# Patient Record
Sex: Male | Born: 1935 | Race: White | Hispanic: No | Marital: Married | Smoking: Former smoker
Health system: Southern US, Community
[De-identification: ages and names within clinical notes are randomized; demographics above are authoritative.]

## PROBLEM LIST (undated history)

## (undated) DIAGNOSIS — M549 Dorsalgia, unspecified: Secondary | ICD-10-CM

## (undated) DIAGNOSIS — I10 Essential (primary) hypertension: Secondary | ICD-10-CM

## (undated) DIAGNOSIS — T783XXA Angioneurotic edema, initial encounter: Secondary | ICD-10-CM

## (undated) DIAGNOSIS — I712 Thoracic aortic aneurysm, without rupture, unspecified: Secondary | ICD-10-CM

## (undated) DIAGNOSIS — M1990 Unspecified osteoarthritis, unspecified site: Secondary | ICD-10-CM

## (undated) DIAGNOSIS — T4145XA Adverse effect of unspecified anesthetic, initial encounter: Secondary | ICD-10-CM

## (undated) DIAGNOSIS — C801 Malignant (primary) neoplasm, unspecified: Secondary | ICD-10-CM

## (undated) DIAGNOSIS — I493 Ventricular premature depolarization: Secondary | ICD-10-CM

## (undated) DIAGNOSIS — R339 Retention of urine, unspecified: Secondary | ICD-10-CM

## (undated) DIAGNOSIS — Z973 Presence of spectacles and contact lenses: Secondary | ICD-10-CM

## (undated) DIAGNOSIS — T8859XA Other complications of anesthesia, initial encounter: Secondary | ICD-10-CM

## (undated) DIAGNOSIS — I714 Abdominal aortic aneurysm, without rupture, unspecified: Secondary | ICD-10-CM

## (undated) DIAGNOSIS — N189 Chronic kidney disease, unspecified: Secondary | ICD-10-CM

## (undated) DIAGNOSIS — I2510 Atherosclerotic heart disease of native coronary artery without angina pectoris: Secondary | ICD-10-CM

## (undated) HISTORY — PX: CARDIAC CATHETERIZATION: SHX172

## (undated) HISTORY — PX: COLONOSCOPY: SHX174

## (undated) HISTORY — PX: TONSILLECTOMY: SUR1361

## (undated) HISTORY — PX: CORONARY ARTERY BYPASS GRAFT: SHX141

## (undated) HISTORY — PX: SPINE SURGERY: SHX786

## (undated) HISTORY — DX: Angioneurotic edema, initial encounter: T78.3XXA

## (undated) HISTORY — PX: HERNIA REPAIR: SHX51

---

## 1998-02-08 ENCOUNTER — Inpatient Hospital Stay (HOSPITAL_COMMUNITY): Admission: EM | Admit: 1998-02-08 | Discharge: 1998-02-10 | Payer: Self-pay | Admitting: Emergency Medicine

## 1998-02-18 ENCOUNTER — Inpatient Hospital Stay (HOSPITAL_COMMUNITY): Admission: AD | Admit: 1998-02-18 | Discharge: 1998-02-20 | Payer: Self-pay | Admitting: Cardiology

## 1998-05-31 ENCOUNTER — Inpatient Hospital Stay (HOSPITAL_COMMUNITY): Admission: EM | Admit: 1998-05-31 | Discharge: 1998-06-02 | Payer: Self-pay | Admitting: Cardiology

## 1998-06-16 ENCOUNTER — Emergency Department (HOSPITAL_COMMUNITY): Admission: EM | Admit: 1998-06-16 | Discharge: 1998-06-16 | Payer: Self-pay | Admitting: Emergency Medicine

## 1998-06-16 ENCOUNTER — Encounter: Payer: Self-pay | Admitting: *Deleted

## 2000-04-18 ENCOUNTER — Emergency Department (HOSPITAL_COMMUNITY): Admission: EM | Admit: 2000-04-18 | Discharge: 2000-04-18 | Payer: Self-pay

## 2004-01-15 ENCOUNTER — Encounter: Admission: RE | Admit: 2004-01-15 | Discharge: 2004-01-15 | Payer: Self-pay | Admitting: Otolaryngology

## 2005-06-07 ENCOUNTER — Encounter: Admission: RE | Admit: 2005-06-07 | Discharge: 2005-06-07 | Payer: Self-pay | Admitting: Family Medicine

## 2005-12-11 ENCOUNTER — Ambulatory Visit (HOSPITAL_COMMUNITY): Admission: RE | Admit: 2005-12-11 | Discharge: 2005-12-11 | Payer: Self-pay | Admitting: Neurosurgery

## 2006-04-17 ENCOUNTER — Ambulatory Visit (HOSPITAL_COMMUNITY): Admission: RE | Admit: 2006-04-17 | Discharge: 2006-04-17 | Payer: Self-pay | Admitting: Neurosurgery

## 2008-01-17 ENCOUNTER — Ambulatory Visit: Payer: Self-pay | Admitting: Gastroenterology

## 2008-02-03 ENCOUNTER — Ambulatory Visit: Payer: Self-pay | Admitting: Gastroenterology

## 2008-07-24 HISTORY — PX: CORONARY ARTERY BYPASS GRAFT: SHX141

## 2009-05-26 ENCOUNTER — Ambulatory Visit (HOSPITAL_COMMUNITY): Admission: RE | Admit: 2009-05-26 | Discharge: 2009-05-26 | Payer: Self-pay | Admitting: Cardiology

## 2009-05-31 ENCOUNTER — Ambulatory Visit: Payer: Self-pay | Admitting: Surgery

## 2009-06-08 ENCOUNTER — Ambulatory Visit: Payer: Self-pay | Admitting: Thoracic Surgery (Cardiothoracic Vascular Surgery)

## 2009-06-09 ENCOUNTER — Encounter: Payer: Self-pay | Admitting: Thoracic Surgery (Cardiothoracic Vascular Surgery)

## 2009-06-09 ENCOUNTER — Ambulatory Visit (HOSPITAL_COMMUNITY)
Admission: RE | Admit: 2009-06-09 | Discharge: 2009-06-09 | Payer: Self-pay | Admitting: Thoracic Surgery (Cardiothoracic Vascular Surgery)

## 2009-06-11 ENCOUNTER — Ambulatory Visit: Payer: Self-pay | Admitting: Thoracic Surgery (Cardiothoracic Vascular Surgery)

## 2009-06-11 ENCOUNTER — Inpatient Hospital Stay (HOSPITAL_COMMUNITY)
Admission: RE | Admit: 2009-06-11 | Discharge: 2009-06-15 | Payer: Self-pay | Admitting: Thoracic Surgery (Cardiothoracic Vascular Surgery)

## 2009-07-01 ENCOUNTER — Encounter
Admission: RE | Admit: 2009-07-01 | Discharge: 2009-07-01 | Payer: Self-pay | Admitting: Thoracic Surgery (Cardiothoracic Vascular Surgery)

## 2009-07-01 ENCOUNTER — Ambulatory Visit: Payer: Self-pay | Admitting: Thoracic Surgery (Cardiothoracic Vascular Surgery)

## 2009-07-29 ENCOUNTER — Encounter (HOSPITAL_COMMUNITY): Admission: RE | Admit: 2009-07-29 | Discharge: 2009-08-27 | Payer: Self-pay | Admitting: Cardiology

## 2010-05-18 ENCOUNTER — Encounter: Admission: RE | Admit: 2010-05-18 | Discharge: 2010-05-18 | Payer: Self-pay | Admitting: Unknown Physician Specialty

## 2010-06-03 ENCOUNTER — Encounter: Admission: RE | Admit: 2010-06-03 | Discharge: 2010-06-03 | Payer: Self-pay | Admitting: Unknown Physician Specialty

## 2010-07-24 HISTORY — PX: BACK SURGERY: SHX140

## 2010-08-04 ENCOUNTER — Ambulatory Visit (HOSPITAL_COMMUNITY)
Admission: RE | Admit: 2010-08-04 | Discharge: 2010-08-05 | Payer: Self-pay | Source: Home / Self Care | Attending: Neurosurgery | Admitting: Neurosurgery

## 2010-08-08 LAB — CBC
HCT: 41 % (ref 39.0–52.0)
Hemoglobin: 13.5 g/dL (ref 13.0–17.0)
MCH: 30.1 pg (ref 26.0–34.0)
MCHC: 32.9 g/dL (ref 30.0–36.0)
MCV: 91.3 fL (ref 78.0–100.0)
Platelets: 194 10*3/uL (ref 150–400)
RBC: 4.49 MIL/uL (ref 4.22–5.81)
RDW: 14.4 % (ref 11.5–15.5)
WBC: 6.8 10*3/uL (ref 4.0–10.5)

## 2010-08-08 LAB — BASIC METABOLIC PANEL
BUN: 18 mg/dL (ref 6–23)
CO2: 30 mEq/L (ref 19–32)
Calcium: 9.6 mg/dL (ref 8.4–10.5)
Chloride: 102 mEq/L (ref 96–112)
Creatinine, Ser: 1.2 mg/dL (ref 0.4–1.5)
GFR calc Af Amer: >60 mL/min (ref >60)
GFR calc non Af Amer: 59 mL/min — ABNORMAL LOW (ref >60)
Glucose, Bld: 98 mg/dL (ref 70–99)
Potassium: 5.2 mEq/L — ABNORMAL HIGH (ref 3.5–5.1)
Sodium: 139 mEq/L (ref 135–145)

## 2010-08-08 LAB — SURGICAL PCR SCREEN
MRSA, PCR: NEGATIVE
Staphylococcus aureus: POSITIVE — AB

## 2010-08-31 ENCOUNTER — Other Ambulatory Visit: Payer: Self-pay | Admitting: Neurosurgery

## 2010-08-31 DIAGNOSIS — M7918 Myalgia, other site: Secondary | ICD-10-CM

## 2010-09-02 ENCOUNTER — Ambulatory Visit
Admission: RE | Admit: 2010-09-02 | Discharge: 2010-09-02 | Disposition: A | Discharge status: Home / Self Care | Payer: MEDICARE | Source: Ambulatory Visit | Attending: Neurosurgery | Admitting: Neurosurgery

## 2010-09-02 DIAGNOSIS — M7918 Myalgia, other site: Secondary | ICD-10-CM

## 2010-09-02 MED ORDER — GADOBENATE DIMEGLUMINE 529 MG/ML IV SOLN: 15.0000 mL | Freq: Once | INTRAVENOUS | Status: AC | PRN

## 2010-10-26 LAB — BASIC METABOLIC PANEL
BUN: 7 mg/dL (ref 6–23)
BUN: 9 mg/dL (ref 6–23)
CO2: 26 mEq/L (ref 19–32)
CO2: 28 mEq/L (ref 19–32)
CO2: 31 mEq/L (ref 19–32)
Calcium: 8 mg/dL — ABNORMAL LOW (ref 8.4–10.5)
Calcium: 8.2 mg/dL — ABNORMAL LOW (ref 8.4–10.5)
Chloride: 101 mEq/L (ref 96–112)
Chloride: 107 mEq/L (ref 96–112)
Chloride: 99 mEq/L (ref 96–112)
Creatinine, Ser: 0.95 mg/dL (ref 0.4–1.5)
Creatinine, Ser: 0.99 mg/dL (ref 0.4–1.5)
GFR calc Af Amer: >60 mL/min (ref >60)
GFR calc Af Amer: >60 mL/min (ref >60)
GFR calc Af Amer: >60 mL/min (ref >60)
GFR calc non Af Amer: >60 mL/min (ref >60)
GFR calc non Af Amer: >60 mL/min (ref >60)
Glucose, Bld: 115 mg/dL — ABNORMAL HIGH (ref 70–99)
Glucose, Bld: 117 mg/dL — ABNORMAL HIGH (ref 70–99)
Potassium: 3.9 mEq/L (ref 3.5–5.1)
Potassium: 4 mEq/L (ref 3.5–5.1)
Potassium: 4.1 mEq/L (ref 3.5–5.1)
Sodium: 132 mEq/L — ABNORMAL LOW (ref 135–145)
Sodium: 135 mEq/L (ref 135–145)
Sodium: 138 mEq/L (ref 135–145)

## 2010-10-26 LAB — POCT I-STAT 4, (NA,K, GLUC, HGB,HCT)
Glucose, Bld: 114 mg/dL — ABNORMAL HIGH (ref 70–99)
Glucose, Bld: 120 mg/dL — ABNORMAL HIGH (ref 70–99)
Glucose, Bld: 123 mg/dL — ABNORMAL HIGH (ref 70–99)
Glucose, Bld: 94 mg/dL (ref 70–99)
HCT: 26 % — ABNORMAL LOW (ref 39.0–52.0)
HCT: 35 % — ABNORMAL LOW (ref 39.0–52.0)
Hemoglobin: 10.2 g/dL — ABNORMAL LOW (ref 13.0–17.0)
Hemoglobin: 10.9 g/dL — ABNORMAL LOW (ref 13.0–17.0)
Potassium: 3.4 mEq/L — ABNORMAL LOW (ref 3.5–5.1)
Potassium: 4.2 mEq/L (ref 3.5–5.1)
Sodium: 139 mEq/L (ref 135–145)
Sodium: 141 mEq/L (ref 135–145)
Sodium: 142 mEq/L (ref 135–145)
Sodium: 142 mEq/L (ref 135–145)

## 2010-10-26 LAB — COMPREHENSIVE METABOLIC PANEL
ALT: 16 U/L (ref 0–53)
AST: 21 U/L (ref 0–37)
Albumin: 3.5 g/dL (ref 3.5–5.2)
Alkaline Phosphatase: 83 U/L (ref 39–117)
BUN: 12 mg/dL (ref 6–23)
Chloride: 104 mEq/L (ref 96–112)
Potassium: 4.9 mEq/L (ref 3.5–5.1)
Sodium: 136 mEq/L (ref 135–145)
Total Bilirubin: 1 mg/dL (ref 0.3–1.2)
Total Protein: 6.3 g/dL (ref 6.0–8.3)

## 2010-10-26 LAB — CBC
HCT: 27.8 % — ABNORMAL LOW (ref 39.0–52.0)
HCT: 30.6 % — ABNORMAL LOW (ref 39.0–52.0)
HCT: 30.7 % — ABNORMAL LOW (ref 39.0–52.0)
HCT: 32.5 % — ABNORMAL LOW (ref 39.0–52.0)
HCT: 41.3 % (ref 39.0–52.0)
Hemoglobin: 10.6 g/dL — ABNORMAL LOW (ref 13.0–17.0)
Hemoglobin: 10.6 g/dL — ABNORMAL LOW (ref 13.0–17.0)
Hemoglobin: 10.6 g/dL — ABNORMAL LOW (ref 13.0–17.0)
Hemoglobin: 11.3 g/dL — ABNORMAL LOW (ref 13.0–17.0)
Hemoglobin: 11.5 g/dL — ABNORMAL LOW (ref 13.0–17.0)
Hemoglobin: 14.1 g/dL (ref 13.0–17.0)
Hemoglobin: 9.6 g/dL — ABNORMAL LOW (ref 13.0–17.0)
MCHC: 34 g/dL (ref 30.0–36.0)
MCHC: 34.2 g/dL (ref 30.0–36.0)
MCHC: 34.5 g/dL (ref 30.0–36.0)
MCHC: 34.6 g/dL (ref 30.0–36.0)
MCHC: 34.8 g/dL (ref 30.0–36.0)
MCV: 94.5 fL (ref 78.0–100.0)
MCV: 94.9 fL (ref 78.0–100.0)
MCV: 94.9 fL (ref 78.0–100.0)
MCV: 95.2 fL (ref 78.0–100.0)
MCV: 95.3 fL (ref 78.0–100.0)
Platelets: 113 10*3/uL — ABNORMAL LOW (ref 150–400)
Platelets: 117 10*3/uL — ABNORMAL LOW (ref 150–400)
Platelets: 132 10*3/uL — ABNORMAL LOW (ref 150–400)
Platelets: 195 10*3/uL (ref 150–400)
RBC: 2.93 MIL/uL — ABNORMAL LOW (ref 4.22–5.81)
RBC: 3.22 MIL/uL — ABNORMAL LOW (ref 4.22–5.81)
RBC: 3.23 MIL/uL — ABNORMAL LOW (ref 4.22–5.81)
RBC: 3.27 MIL/uL — ABNORMAL LOW (ref 4.22–5.81)
RBC: 3.54 MIL/uL — ABNORMAL LOW (ref 4.22–5.81)
RBC: 4.34 MIL/uL (ref 4.22–5.81)
RDW: 14 % (ref 11.5–15.5)
RDW: 14.1 % (ref 11.5–15.5)
RDW: 14.5 % (ref 11.5–15.5)
RDW: 14.6 % (ref 11.5–15.5)
WBC: 10 10*3/uL (ref 4.0–10.5)
WBC: 10.1 10*3/uL (ref 4.0–10.5)
WBC: 7.2 10*3/uL (ref 4.0–10.5)
WBC: 8.3 10*3/uL (ref 4.0–10.5)
WBC: 9.1 10*3/uL (ref 4.0–10.5)
WBC: 9.2 10*3/uL (ref 4.0–10.5)
WBC: 9.9 10*3/uL (ref 4.0–10.5)

## 2010-10-26 LAB — GLUCOSE, CAPILLARY
Glucose-Capillary: 119 mg/dL — ABNORMAL HIGH (ref 70–99)
Glucose-Capillary: 125 mg/dL — ABNORMAL HIGH (ref 70–99)
Glucose-Capillary: 136 mg/dL — ABNORMAL HIGH (ref 70–99)
Glucose-Capillary: 140 mg/dL — ABNORMAL HIGH (ref 70–99)
Glucose-Capillary: 95 mg/dL (ref 70–99)

## 2010-10-26 LAB — POCT I-STAT, CHEM 8
BUN: 6 mg/dL (ref 6–23)
Chloride: 104 mEq/L (ref 96–112)
Creatinine, Ser: 1 mg/dL (ref 0.4–1.5)
Creatinine, Ser: 1.1 mg/dL (ref 0.4–1.5)
Glucose, Bld: 140 mg/dL — ABNORMAL HIGH (ref 70–99)
HCT: 30 % — ABNORMAL LOW (ref 39.0–52.0)
Hemoglobin: 10.2 g/dL — ABNORMAL LOW (ref 13.0–17.0)
Potassium: 4.6 mEq/L (ref 3.5–5.1)
Sodium: 134 mEq/L — ABNORMAL LOW (ref 135–145)
Sodium: 140 mEq/L (ref 135–145)
TCO2: 23 mmol/L (ref 0–100)
TCO2: 27 mmol/L (ref 0–100)

## 2010-10-26 LAB — APTT
aPTT: 26 seconds (ref 24–37)
aPTT: 29 seconds (ref 24–37)

## 2010-10-26 LAB — BLOOD GAS, ARTERIAL
Acid-Base Excess: 2.3 mmol/L — ABNORMAL HIGH (ref 0.0–2.0)
Bicarbonate: 26.5 mEq/L — ABNORMAL HIGH (ref 20.0–24.0)
Drawn by: 181601
FIO2: 0.21 %
O2 Saturation: 97.8 %
Patient temperature: 98.6
TCO2: 27.8 mmol/L (ref 0–100)
pCO2 arterial: 42.3 mmHg (ref 35.0–45.0)
pH, Arterial: 7.414 (ref 7.350–7.450)
pO2, Arterial: 97.1 mmHg (ref 80.0–100.0)

## 2010-10-26 LAB — POCT I-STAT 3, ART BLOOD GAS (G3+)
Acid-Base Excess: 6 mmol/L — ABNORMAL HIGH (ref 0.0–2.0)
Bicarbonate: 25.3 mEq/L — ABNORMAL HIGH (ref 20.0–24.0)
O2 Saturation: 100 %
Patient temperature: 37
TCO2: 32 mmol/L (ref 0–100)
pCO2 arterial: 41.4 mmHg (ref 35.0–45.0)
pCO2 arterial: 43.2 mmHg (ref 35.0–45.0)
pCO2 arterial: 45.3 mmHg — ABNORMAL HIGH (ref 35.0–45.0)
pO2, Arterial: 122 mmHg — ABNORMAL HIGH (ref 80.0–100.0)
pO2, Arterial: 393 mmHg — ABNORMAL HIGH (ref 80.0–100.0)
pO2, Arterial: 80 mmHg (ref 80.0–100.0)

## 2010-10-26 LAB — URINALYSIS, ROUTINE W REFLEX MICROSCOPIC
Glucose, UA: NEGATIVE mg/dL
Ketones, ur: NEGATIVE mg/dL
Nitrite: NEGATIVE
Specific Gravity, Urine: <1.005 — ABNORMAL LOW (ref 1.005–1.030)
pH: 8 (ref 5.0–8.0)

## 2010-10-26 LAB — PROTIME-INR
INR: 1.3 (ref 0.00–1.49)
Prothrombin Time: 16.1 seconds — ABNORMAL HIGH (ref 11.6–15.2)

## 2010-10-26 LAB — POCT I-STAT GLUCOSE
Glucose, Bld: 98 mg/dL (ref 70–99)
Operator id: 300801

## 2010-10-26 LAB — HEMOGLOBIN AND HEMATOCRIT, BLOOD: Hemoglobin: 9.4 g/dL — ABNORMAL LOW (ref 13.0–17.0)

## 2010-10-26 LAB — CREATININE, SERUM
Creatinine, Ser: 1.13 mg/dL (ref 0.4–1.5)
GFR calc Af Amer: >60 mL/min (ref >60)
GFR calc non Af Amer: >60 mL/min (ref >60)
GFR calc non Af Amer: >60 mL/min (ref >60)

## 2010-10-26 LAB — TYPE AND SCREEN: ABO/RH(D): O POS

## 2010-10-26 LAB — MAGNESIUM
Magnesium: 2 mg/dL (ref 1.5–2.5)
Magnesium: 2.4 mg/dL (ref 1.5–2.5)

## 2010-10-26 LAB — ABO/RH: ABO/RH(D): O POS

## 2010-10-26 LAB — MRSA PCR SCREENING: MRSA by PCR: NEGATIVE

## 2010-10-26 LAB — PLATELET COUNT: Platelets: 133 10*3/uL — ABNORMAL LOW (ref 150–400)

## 2010-12-06 NOTE — Procedures (Signed)
VASCULAR LAB EXAM   INDICATION:  Upper extremity cephalic vein mapping preoperative for  CABG.  Has had numerous lower extremity vein procedures including  bilateral stripping and laser ablations.   HISTORY:  Diabetes:  no  Cardiac:  CHF  Hypertension:  no   EXAM:  Upper extremity cephalic vein mapping.   IMPRESSION:  Patent bilateral small cephalic veins with measurements as  shown on drawing.   ___________________________________________  V. Charlena Cross, MD   AS/MEDQ  D:  05/31/2009  T:  05/31/2009  Job:  [id number]

## 2010-12-06 NOTE — H&P (Signed)
HISTORY AND PHYSICAL EXAMINATION   June 08, 2009   Re:  TOBEN, ACUNA T         DOB:  June 22, 1932   REASON FOR CONSULTATION:  Severe three-vessel coronary disease.   HISTORY OF PRESENT ILLNESS:  The patient is a 77 year old gentleman with  a history of known coronary disease.  He recently has been noting chest  pain when he walks up.  He walks about 3 miles a day and the initial  portion is downhill and then there is an incline on his return trip and  he has been getting chest pain, which he describes as a pressure  sensation in his mid chest as he walks up the hill, it last about 10  minutes, is relieved with rest and he is able to complete his walk  without having any additional pain even though significant portion of  the remaining walk is uphill as well.  He states he walks 3 miles 5  times a day.  He also works with [employer] with [employer] a couple times a  day.  He has not had any rest or nocturnal pain.  He saw Dr. Donnie Aho and  had a stress Cardiolite.  He had good exercise capacity, but did develop  ST depression and anterior ischemia and the Cardiolite portion showed  anterior defect that was reversible.  He denies any paroxysmal nocturnal  dyspnea or orthopnea.  He does get some peripheral edema.  He has had  varicose veins that have been stripped and had some venous insufficiency  associated with that.   PAST MEDICAL HISTORY:  Significant for coronary artery disease, PTCA of  the LAD in 1990, repeat in 1991, stent to the left circumflex in 1999,  in-stent restenosis with revision in 2010, hyperlipidemia,  gastroesophageal reflux, lumbar disk disease, bilateral varicose veins  with bilateral leg stripping as well as some laser work, venous  insufficiency.   CURRENT MEDICATIONS:  Lipitor 20 mg daily, nitroglycerin 0.4 mg p.r.n.,  Ocuvite 1 tablet daily, vitamin C 500 mg daily, vitamin D 400  international units daily, metoprolol 25 mg one-half tablet  b.i.d.,  Lutein 10 mg daily, Cardizem CD 180 mg daily, fish oil 1000 mg daily,  aspirin 81 mg daily, Coenzyme Q10 at 100 mg daily, loratadine 10 mg  daily, vitamin B complex 1 p.o. daily, and calcium 630 mg daily.   He has known drug allergies.   FAMILY HISTORY:  Strongly positive for coronary disease.  His mother  died at age 68 with an MI.   SOCIAL HISTORY:  He is married.  He worked for [employer]	[place].  He  has been retired for the past 9 years, but remains physically active.  He has 3 children.  He did smoke.  He quit in 1965, 1-2 packs a day  prior to that.  He does not drink alcohol.   REVIEW OF SYSTEMS:  He has a rash that is followed by Dr. Nicholas Lose; chest  pain as noted; hiatal hernia, but only has reflux symptoms when he is  under stress; and venous insufficiency with peripheral edema, and he  bleeds easily from his lower legs with trauma.  All other systems are  negative.   PHYSICAL EXAMINATION:  General:  The patient is a well appearing 77-year-  old gentleman, in no acute distress.  Vital Signs:  His blood pressure  is 125/69, pulse 49, respirations are 18, and his oxygen saturation is  95% on room air.  Neurological:  He  is alert and oriented x3 with no  focal deficits.  HEENT:  Unremarkable.  He has good dentition.  Neck:  Has a right carotid bruit, which is very faint.  Cardiac:  Regular rate  and rhythm with no rubs or murmurs.  Lungs:  Clear with equal breath  sounds bilaterally.  He has 2+ radial pulses bilaterally.  It is  difficult to palpate distal pulses in his dorsalis pedis or posterior  tibial regions.  His Allen test on both sides is equivocal with very  delayed refill.   Cardiac catheterization was reviewed and I reviewed that with the  patient and his wife as well.   IMPRESSION:  The patient is a 77 year old gentleman with multiple  cardiac risk factors including hyperlipidemia, previous tobacco abuse,  and a strong family history.  He has known  coronary disease dating back  as far as 13, but has taken extremely good care of himself in terms of  diet and exercise, but now has severe three-vessel coronary disease.  He  is newly symptomatic.  His symptoms are just developed over the past  month and he has a strongly positive Cardiolite.  Coronary bypass  grafting is indicated for revascularization for survival benefit as well  as relief of symptoms.  The difficulty in his case comes in terms of  conduit.  His ideal operation would be to graft the right coronary, the  LAD, the large obtuse marginal as well as a small diagonal branch of the  LAD.  It is unclear if we will have adequate conduit to do so, there is  no question, his LAD can be bypassed and if we use bilateral mammary  arteries, we could definitely bypass the LAD and the obtuse marginal  even if we have to take the right mammary off the left to have  sufficient length that really is not a problem from a technical  standpoint.  The bigger issue as whether or not his radial artery will  be available to use a third graft.  His Allen test, which was repeated  multiple times, is equivocal, is not clearly positive or negative and  would be an intraoperative assessment to see if it was usable.  I  discussed this clearly with the patient and his wife and they do  understand that even at the intraoperative assessment is favorable,  there still potential for some hand ischemia if the radial artery was  utilized.  My best estimate is that it will be usable, but there is a  possibility intraoperative findings would be prohibitive.   I do believe that he would be clinically much better treated with bypass  surgery even if we were only able to do the LAD and the circumflex  marginal.  The right coronary can immediately be addressed with  angioplasty if necessary, but it is not as a critical lesion and is  really only minimally symptomatic even with critical lesions in his LAD  and  the circumflex.  I had a long discussion with the patient regarding  these issues as well as discussed with him the risks, which include, but  not limited to death, stroke, MI, DVT, PE, bleeding, possible need for  transfusions, infections as well as other organ system dysfunction  including respiratory, renal, hepatic, or GI complications as well as  the small chance of limited hand ischemia in the setting of radial  artery harvest.  He understands and accepts these risks and agrees to  proceed.  He wishes to proceed with surgery as soon as possible.  He is  scheduled for Friday, June 11, 2009.  He will be admitted on the day  of surgery through short stay.   Salvatore Decent Dorris Fetch, M.D.  Electronically Signed   SCH/MEDQ  D:  06/08/2009  T:  06/09/2009  Job:  [id number]   cc:   Georga Hacking, M.D.  Quita Skye Artis Flock, M.D.

## 2010-12-06 NOTE — Procedures (Signed)
CAROTID DUPLEX EXAM   INDICATION:  Preoperative CABG.   HISTORY:  Diabetes:  no  Cardiac:  CHF  Hypertension:  no  Smoking:  Quit in 1966  Previous Surgery:  Lower extremity vein procedures  CV History:  Asymptomatic  Amaurosis Fugax No, Paresthesias No, Hemiparesis No                                       RIGHT             LEFT  Brachial systolic pressure:         140               136  Brachial Doppler waveforms:         WNL               WNL  Vertebral direction of flow:        Antegrade         Antegrade  DUPLEX VELOCITIES (cm/sec)  CCA peak systolic                   100               71  ECA peak systolic                   127               99  ICA peak systolic                   75                86  ICA end diastolic                   20                27  PLAQUE MORPHOLOGY:                  calcific          calcific  PLAQUE AMOUNT:                      mild              mild  PLAQUE LOCATION:                    ICA/ CCA          ICA/ CCA   IMPRESSION:  Bilateral internal carotid arteries show evidence of 1- 39%  stenosis.   ___________________________________________  V. Charlena Cross, MD   AS/MEDQ  D:  05/31/2009  T:  05/31/2009  Job:  [id number]

## 2010-12-06 NOTE — Assessment & Plan Note (Signed)
OFFICE VISIT   Andrew [name], Andrew [name]  DOB:  11/09/1931                                        July 01, 2009  CHART #:  [id number]   The patient is a 77 year old gentleman who underwent coronary bypass  grafting x3, all arterial grafting on June 11, 2009.  Postoperatively, he did well without any significant complications and  was discharged home.  Since he was at home, he started having irregular  heart rhythms, primarily at 90s, said he would exercise, he is walking  like 20 minutes twice a day and would not have any problems.  When he  would lie down to sleep at night, he could feel his heart beating very  irregularly and rapidly.  He saw Dr. Donnie Aho, a monitor was placed and he  was started on amiodarone presumably for atrial fibrillation, I do not  have any records.  He says that over the last couple of days, that has  improved dramatically and he did not have any alarms with his ambulatory  monitor yesterday for the first time since he was started on this new  medication.  He says also over the last couple of days, his appetite has  started to improve and his overall energy level has started to improve.   CURRENT MEDICATIONS:  1. Lipitor 20 mg daily.  2. Ocuvite.  3. Amiodarone 200 mg daily.  4. Prevacid daily.  5. Imdur 30 mg daily.  6. Loratadine 10 mg daily.  7. Aspirin 81 mg daily.  8. Cardizem CD 180 mg daily.  9. Metoprolol 25 mg daily.   On physical examination, the patient is a 77 year old gentleman, in no  acute distress.  His blood pressure is 125/67, pulse 71, respirations  are 18, and his oxygen saturation is 97% on room air.  His lungs are  clear with equal breath sounds bilaterally.  His cardiac exam has  regular rate and rhythm.  Normal S1 and S2.  His sternum is stable.  His  sternal incision is clean, dry, and intact.  His left arm incision has  an eschar along the incision, but no signs of infection.  His hand is  neurologically intact with brisk capillary refill.   Chest x-ray shows a tiny right pleural effusion.   IMPRESSION:  The patient is a 77 year old gentleman who is status post  coronary bypass grafting only about 3 weeks ago.  He is doing very well  overall at this point in time.  His exercise tolerance is tremendous.  He is having no pain to speak of, but has not taken any pain pill since  he left the hospital.  The only complicating factor is that he has  developed apparently some atrial fibrillation.  He has been started on  amiodarone and it now seems like that is resolving as well.  At this  point in time, I think he is fine to start driving a car on a limited  basis.  Appropriate precautions were discussed.  He is still not to lift  anything that weighs greater than 10 pounds until the first of the year.  Other than that his activities are unrestricted.  He will continue to be  followed by Dr. Donnie Aho and Dr. Para March.  I would be happy to see him back  anytime if I can be of any  further assistance with his care.   Salvatore Decent Dorris Fetch, M.D.  Electronically Signed   SCH/MEDQ  D:  07/01/2009  T:  07/02/2009  Job:  [id number]   cc:   Georga Hacking, M.D.  Dwana Curd Para March, M.D.

## 2011-02-06 ENCOUNTER — Other Ambulatory Visit (HOSPITAL_COMMUNITY): Payer: Self-pay | Admitting: Orthopedic Surgery

## 2011-02-06 DIAGNOSIS — M545 Low back pain, unspecified: Secondary | ICD-10-CM

## 2011-02-13 ENCOUNTER — Ambulatory Visit (HOSPITAL_COMMUNITY)
Admission: RE | Admit: 2011-02-13 | Discharge: 2011-02-13 | Disposition: A | Discharge status: Home / Self Care | Payer: Medicare Other | Source: Ambulatory Visit | Attending: Orthopedic Surgery | Admitting: Orthopedic Surgery

## 2011-02-13 DIAGNOSIS — Z79899 Other long term (current) drug therapy: Secondary | ICD-10-CM | POA: Insufficient documentation

## 2011-02-13 DIAGNOSIS — M5126 Other intervertebral disc displacement, lumbar region: Secondary | ICD-10-CM | POA: Insufficient documentation

## 2011-02-13 DIAGNOSIS — M545 Low back pain, unspecified: Secondary | ICD-10-CM

## 2011-02-13 MED ORDER — IOHEXOL 180 MG/ML  SOLN
16.0000 mL | Freq: Once | INTRAMUSCULAR | Status: AC | PRN
  Administered 2011-02-13: 16 mL via INTRATHECAL

## 2012-02-12 ENCOUNTER — Ambulatory Visit: Payer: Medicare Other | Attending: Neurology | Admitting: Physical Therapy

## 2012-02-12 DIAGNOSIS — I861 Scrotal varices: Secondary | ICD-10-CM | POA: Insufficient documentation

## 2012-02-12 DIAGNOSIS — M25559 Pain in unspecified hip: Secondary | ICD-10-CM | POA: Insufficient documentation

## 2012-02-12 DIAGNOSIS — IMO0001 Reserved for inherently not codable concepts without codable children: Secondary | ICD-10-CM | POA: Insufficient documentation

## 2012-02-12 DIAGNOSIS — M2569 Stiffness of other specified joint, not elsewhere classified: Secondary | ICD-10-CM | POA: Insufficient documentation

## 2012-02-19 ENCOUNTER — Encounter: Payer: Medicare Other | Admitting: Physical Therapy

## 2012-02-20 ENCOUNTER — Ambulatory Visit: Payer: Medicare Other | Admitting: Physical Therapy

## 2012-07-01 ENCOUNTER — Ambulatory Visit (INDEPENDENT_AMBULATORY_CARE_PROVIDER_SITE_OTHER): Payer: Medicare Other | Admitting: General Surgery

## 2012-07-01 ENCOUNTER — Encounter (INDEPENDENT_AMBULATORY_CARE_PROVIDER_SITE_OTHER): Payer: Self-pay | Admitting: General Surgery

## 2012-07-01 VITALS — BP 136/76 | HR 60 | Temp 97.7°F | Resp 18 | Ht 69.0 in | Wt 156.0 lb

## 2012-07-01 DIAGNOSIS — K4090 Unilateral inguinal hernia, without obstruction or gangrene, not specified as recurrent: Secondary | ICD-10-CM

## 2012-07-01 NOTE — Progress Notes (Signed)
Patient ID: Andrew [name], male   DOB: 05/12/1932, 78 y.o.   MRN: [id number]  Chief Complaint  Patient presents with  . New Evaluation    L/H    HPI Andrew [name] is a 78 y.o. male.  Referred by Dr. Ross HPI This is a 78-year-old male who is otherwise fairly healthy. He has had a coronary artery bypass in 2010 from which he is doing very well. He has been seen by Dr. Tilley recently who has stated he does not need any further evaluation prior to any planned surgery. Over the past several weeks he noticed a left groin mass. He has a history of chronic left hip pain that has been treated a number of ways but this is separate from what he currently feels. He notes a small amount of discomfort at this region. He is still very active and works with [employer] and this is  somewhat preventing him from doing that right now. He comes in today to discuss a possible left inguinal hernia repair. He complains of no changes in his bowel movements, nausea vomiting, or any other symptoms associated with this. He does have a history of a prior right-sided hernia in the early 1980s.  HTN, Chronic hip pain, CAD, Hypercholesterolemia  Past Surgical History  Procedure Date  . Hernia repair     rih 1981  . Coronary artery bypass graft     2010  . Spine surgery     lumbar laminectomy    History reviewed. No pertinent family history.  Social History History  Substance Use Topics  . Smoking status: Former Smoker    Quit date: 07/24/1984  . Smokeless tobacco: Not on file  . Alcohol Use: No    No Known Allergies  Current Outpatient Prescriptions  Medication Sig Dispense Refill  . aspirin 81 MG tablet Take 81 mg by mouth daily.      . atorvastatin (LIPITOR) 20 MG tablet       . BOOSTRIX 5-2.5-18.5 injection       . calcium acetate (PHOSLO) 667 MG capsule Take 667 mg by mouth 3 (three) times daily with meals.      . calcium-vitamin D 250-100 MG-UNIT per tablet Take by mouth once. Vitamin  D 3 2,500      . co-enzyme Q-10 50 MG capsule Take 50 mg by mouth daily.      . diltiazem (DILACOR XR) 180 MG 24 hr capsule       . fish oil-omega-3 fatty acids 1000 MG capsule Take 2 g by mouth daily.      . gabapentin (NEURONTIN) 300 MG capsule       . multivitamin-lutein (OCUVITE-LUTEIN) CAPS Take 1 capsule by mouth daily.      . vitamin B-12 (CYANOCOBALAMIN) 250 MCG tablet Take 1,600 mcg by mouth daily.      . vitamin C (ASCORBIC ACID) 500 MG tablet Take 500 mg by mouth daily.      . vitamin A 10000 UNIT capsule Take 10,000 Units by mouth daily.        Review of Systems Review of Systems  Constitutional: Negative for fever, chills and unexpected weight change.  HENT: Negative for hearing loss, congestion, sore throat, trouble swallowing and voice change.   Eyes: Negative for visual disturbance.  Respiratory: Negative for cough and wheezing.   Cardiovascular: Negative for chest pain, palpitations and leg swelling.  Gastrointestinal: Negative for nausea, vomiting, abdominal pain, diarrhea, constipation, blood in stool, abdominal distention,   anal bleeding and rectal pain.  Genitourinary: Negative for hematuria and difficulty urinating.  Musculoskeletal: Negative for arthralgias.  Skin: Negative for rash and wound.  Neurological: Negative for seizures, syncope, weakness and headaches.  Hematological: Negative for adenopathy. Bruises/bleeds easily.  Psychiatric/Behavioral: Negative for confusion.    Blood pressure 136/76, pulse 60, temperature 97.7 F (36.5 C), temperature source Temporal, resp. rate 18, height 5' 9" (1.753 m), weight 156 lb (70.761 kg).  Physical Exam Physical Exam  Constitutional: He appears well-developed and well-nourished.  Cardiovascular: Normal rate, regular rhythm and normal heart sounds.   Pulmonary/Chest: Effort normal and breath sounds normal. He has no wheezes. He has no rales.    Abdominal: Soft. There is no tenderness. A hernia is present. Hernia  confirmed positive in the left inguinal area (small, easily reducible, nontender).     Assessment    LIH     Plan    We discussed observation versus repair.  We discussed both laparoscopic and open inguinal hernia repairs. I described the procedure in detail.  The patient was given educational material.  Goals should be achieved with surgery. We discussed the usage of mesh and the rationale behind that. We went over the pathophysiology of inguinal hernia. We have elected to perform open inguinal hernia repair with mesh.  We discussed the risks including bleeding, infection, recurrence, postoperative pain and chronic groin pain, testicular injury, urinary retention, numbness in groin and around incision.       Andrew [name] 07/01/2012, 5:18 PM    

## 2012-07-23 ENCOUNTER — Encounter (HOSPITAL_BASED_OUTPATIENT_CLINIC_OR_DEPARTMENT_OTHER): Payer: Self-pay | Admitting: *Deleted

## 2012-07-23 NOTE — Progress Notes (Signed)
Pt will come in for labs CCS ordered. Had to have foley post op cabg and lumb lam We will need to be sure he can void post op.

## 2012-07-25 ENCOUNTER — Encounter (HOSPITAL_BASED_OUTPATIENT_CLINIC_OR_DEPARTMENT_OTHER)
Admission: RE | Admit: 2012-07-25 | Discharge: 2012-07-25 | Disposition: A | Discharge status: Home / Self Care | Payer: Medicare Other | Source: Ambulatory Visit | Attending: General Surgery | Admitting: General Surgery

## 2012-07-25 LAB — CBC WITH DIFFERENTIAL/PLATELET
Basophils Absolute: 0.1 10*3/uL (ref 0.0–0.1)
Eosinophils Relative: 11 % — ABNORMAL HIGH (ref 0–5)
HCT: 39.8 % (ref 39.0–52.0)
Lymphocytes Relative: 15 % (ref 12–46)
MCV: 92.1 fL (ref 78.0–100.0)
Monocytes Absolute: 0.9 10*3/uL (ref 0.1–1.0)
RDW: 14.2 % (ref 11.5–15.5)
WBC: 7.3 10*3/uL (ref 4.0–10.5)

## 2012-07-25 LAB — BASIC METABOLIC PANEL
BUN: 20 mg/dL (ref 6–23)
CO2: 30 mEq/L (ref 19–32)
Chloride: 100 mEq/L (ref 96–112)
Creatinine, Ser: 1.17 mg/dL (ref 0.50–1.35)
Glucose, Bld: 80 mg/dL (ref 70–99)

## 2012-07-30 ENCOUNTER — Encounter (HOSPITAL_BASED_OUTPATIENT_CLINIC_OR_DEPARTMENT_OTHER): Payer: Self-pay | Admitting: *Deleted

## 2012-07-30 ENCOUNTER — Encounter (HOSPITAL_BASED_OUTPATIENT_CLINIC_OR_DEPARTMENT_OTHER)
Admission: RE | Disposition: A | Discharge status: Home / Self Care | Payer: Self-pay | Source: Ambulatory Visit | Attending: General Surgery

## 2012-07-30 ENCOUNTER — Ambulatory Visit (HOSPITAL_BASED_OUTPATIENT_CLINIC_OR_DEPARTMENT_OTHER): Payer: Medicare Other | Admitting: *Deleted

## 2012-07-30 ENCOUNTER — Ambulatory Visit (HOSPITAL_BASED_OUTPATIENT_CLINIC_OR_DEPARTMENT_OTHER)
Admission: RE | Admit: 2012-07-30 | Discharge: 2012-07-30 | Disposition: A | Discharge status: Home / Self Care | Payer: Medicare Other | Source: Ambulatory Visit | Attending: General Surgery | Admitting: General Surgery

## 2012-07-30 DIAGNOSIS — Z7982 Long term (current) use of aspirin: Secondary | ICD-10-CM | POA: Insufficient documentation

## 2012-07-30 DIAGNOSIS — E7800 Pure hypercholesterolemia, unspecified: Secondary | ICD-10-CM | POA: Insufficient documentation

## 2012-07-30 DIAGNOSIS — I10 Essential (primary) hypertension: Secondary | ICD-10-CM | POA: Insufficient documentation

## 2012-07-30 DIAGNOSIS — K4090 Unilateral inguinal hernia, without obstruction or gangrene, not specified as recurrent: Secondary | ICD-10-CM | POA: Insufficient documentation

## 2012-07-30 DIAGNOSIS — I2510 Atherosclerotic heart disease of native coronary artery without angina pectoris: Secondary | ICD-10-CM | POA: Insufficient documentation

## 2012-07-30 DIAGNOSIS — Z87891 Personal history of nicotine dependence: Secondary | ICD-10-CM | POA: Insufficient documentation

## 2012-07-30 DIAGNOSIS — D176 Benign lipomatous neoplasm of spermatic cord: Secondary | ICD-10-CM | POA: Insufficient documentation

## 2012-07-30 HISTORY — DX: Essential (primary) hypertension: I10

## 2012-07-30 HISTORY — DX: Dorsalgia, unspecified: M54.9

## 2012-07-30 HISTORY — DX: Unspecified osteoarthritis, unspecified site: M19.90

## 2012-07-30 HISTORY — PX: INSERTION OF MESH: SHX5868

## 2012-07-30 HISTORY — DX: Atherosclerotic heart disease of native coronary artery without angina pectoris: I25.10

## 2012-07-30 HISTORY — DX: Other complications of anesthesia, initial encounter: T88.59XA

## 2012-07-30 HISTORY — DX: Presence of spectacles and contact lenses: Z97.3

## 2012-07-30 HISTORY — DX: Chronic kidney disease, unspecified: N18.9

## 2012-07-30 HISTORY — DX: Adverse effect of unspecified anesthetic, initial encounter: T41.45XA

## 2012-07-30 HISTORY — PX: INGUINAL HERNIA REPAIR: SHX194

## 2012-07-30 SURGERY — REPAIR, HERNIA, INGUINAL, ADULT
Anesthesia: Regional | Site: Abdomen | Laterality: Left | Wound class: Clean

## 2012-07-30 MED ORDER — LACTATED RINGERS IV SOLN
INTRAVENOUS | Status: DC
  Administered 2012-07-30 (×2): via INTRAVENOUS

## 2012-07-30 MED ORDER — HYDROMORPHONE HCL PF 1 MG/ML IJ SOLN: 0.2500 mg | INTRAMUSCULAR | Status: DC | PRN

## 2012-07-30 MED ORDER — EPHEDRINE SULFATE 50 MG/ML IJ SOLN
INTRAMUSCULAR | Status: DC | PRN
  Administered 2012-07-30: 5 mg via INTRAVENOUS
  Administered 2012-07-30: 10 mg via INTRAVENOUS
  Administered 2012-07-30: 5 mg via INTRAVENOUS

## 2012-07-30 MED ORDER — LIDOCAINE HCL (CARDIAC) 20 MG/ML IV SOLN
INTRAVENOUS | Status: DC | PRN
  Administered 2012-07-30: 20 mg via INTRAVENOUS

## 2012-07-30 MED ORDER — FENTANYL CITRATE 0.05 MG/ML IJ SOLN
50.0000 ug | INTRAMUSCULAR | Status: DC | PRN
  Administered 2012-07-30: 50 ug via INTRAVENOUS

## 2012-07-30 MED ORDER — DEXAMETHASONE SODIUM PHOSPHATE 4 MG/ML IJ SOLN
INTRAMUSCULAR | Status: DC | PRN
  Administered 2012-07-30: 5 mg via INTRAVENOUS

## 2012-07-30 MED ORDER — OXYCODONE-ACETAMINOPHEN 5-325 MG PO TABS: 1.0000 | ORAL_TABLET | ORAL | Status: DC | PRN

## 2012-07-30 MED ORDER — MIDAZOLAM HCL 2 MG/2ML IJ SOLN: 0.5000 mg | INTRAMUSCULAR | Status: DC | PRN

## 2012-07-30 MED ORDER — PROPOFOL 10 MG/ML IV BOLUS
INTRAVENOUS | Status: DC | PRN
  Administered 2012-07-30: 100 mg via INTRAVENOUS

## 2012-07-30 MED ORDER — CEFAZOLIN SODIUM-DEXTROSE 2-3 GM-% IV SOLR
2.0000 g | INTRAVENOUS | Status: AC
  Administered 2012-07-30: 2 g via INTRAVENOUS

## 2012-07-30 MED ORDER — ONDANSETRON HCL 4 MG/2ML IJ SOLN
INTRAMUSCULAR | Status: DC | PRN
  Administered 2012-07-30: 4 mg via INTRAVENOUS

## 2012-07-30 MED ORDER — BUPIVACAINE-EPINEPHRINE PF 0.5-1:200000 % IJ SOLN
INTRAMUSCULAR | Status: DC | PRN
  Administered 2012-07-30: 30 mL

## 2012-07-30 MED ORDER — OXYCODONE HCL 5 MG PO TABS: 5.0000 mg | ORAL_TABLET | Freq: Once | ORAL | Status: DC | PRN

## 2012-07-30 MED ORDER — OXYCODONE HCL 5 MG PO TABS
5.0000 mg | ORAL_TABLET | ORAL | Status: AC | PRN
  Administered 2012-07-30: 5 mg via ORAL

## 2012-07-30 MED ORDER — OXYCODONE HCL 5 MG/5ML PO SOLN: 5.0000 mg | Freq: Once | ORAL | Status: DC | PRN

## 2012-07-30 MED ORDER — BUPIVACAINE HCL (PF) 0.25 % IJ SOLN
INTRAMUSCULAR | Status: DC | PRN
  Administered 2012-07-30: 9 mL

## 2012-07-30 MED ORDER — ACETAMINOPHEN 10 MG/ML IV SOLN
1000.0000 mg | Freq: Once | INTRAVENOUS | Status: AC
  Administered 2012-07-30: 1000 mg via INTRAVENOUS

## 2012-07-30 SURGICAL SUPPLY — 42 items
BLADE SURG 15 STRL LF DISP TIS (BLADE) ×1 IMPLANT
BLADE SURG 15 STRL SS (BLADE) ×1
BLADE SURG ROTATE 9660 (MISCELLANEOUS) ×2 IMPLANT
CHLORAPREP W/TINT 26ML (MISCELLANEOUS) ×2 IMPLANT
CLOTH BEACON ORANGE TIMEOUT ST (SAFETY) ×2 IMPLANT
COVER MAYO STAND STRL (DRAPES) ×2 IMPLANT
COVER TABLE BACK 60X90 (DRAPES) ×2 IMPLANT
DECANTER SPIKE VIAL GLASS SM (MISCELLANEOUS) IMPLANT
DERMABOND ADVANCED (GAUZE/BANDAGES/DRESSINGS) ×1
DERMABOND ADVANCED .7 DNX12 (GAUZE/BANDAGES/DRESSINGS) ×1 IMPLANT
DRAIN PENROSE 1/2X12 LTX STRL (WOUND CARE) ×2 IMPLANT
DRAPE PED LAPAROTOMY (DRAPES) ×2 IMPLANT
ELECT COATED BLADE 2.86 ST (ELECTRODE) ×2 IMPLANT
ELECT REM PT RETURN 9FT ADLT (ELECTROSURGICAL) ×2
ELECTRODE REM PT RTRN 9FT ADLT (ELECTROSURGICAL) ×1 IMPLANT
GLOVE BIO SURGEON STRL SZ7 (GLOVE) ×2 IMPLANT
GLOVE BIOGEL PI IND STRL 7.5 (GLOVE) ×1 IMPLANT
GLOVE BIOGEL PI INDICATOR 7.5 (GLOVE) ×1
GLOVE SKINSENSE NS SZ7.0 (GLOVE) ×1
GLOVE SKINSENSE STRL SZ7.0 (GLOVE) ×1 IMPLANT
GOWN PREVENTION PLUS XLARGE (GOWN DISPOSABLE) ×4 IMPLANT
MESH ULTRAPRO 6X6 15CM15CM (Mesh General) ×2 IMPLANT
NEEDLE HYPO 22GX1.5 SAFETY (NEEDLE) ×2 IMPLANT
NS IRRIG 1000ML POUR BTL (IV SOLUTION) ×2 IMPLANT
PACK BASIN DAY SURGERY FS (CUSTOM PROCEDURE TRAY) ×2 IMPLANT
PENCIL BUTTON HOLSTER BLD 10FT (ELECTRODE) ×2 IMPLANT
SLEEVE SCD COMPRESS KNEE MED (MISCELLANEOUS) ×2 IMPLANT
SPONGE LAP 4X18 X RAY DECT (DISPOSABLE) ×2 IMPLANT
STRIP CLOSURE SKIN 1/2X4 (GAUZE/BANDAGES/DRESSINGS) ×2 IMPLANT
SUT MNCRL AB 4-0 PS2 18 (SUTURE) ×2 IMPLANT
SUT PROLENE 2 0 CT2 30 (SUTURE) ×6 IMPLANT
SUT SILK 2 0 SH (SUTURE) IMPLANT
SUT VIC AB 0 SH 27 (SUTURE) IMPLANT
SUT VIC AB 2-0 SH 27 (SUTURE) ×2
SUT VIC AB 2-0 SH 27XBRD (SUTURE) ×2 IMPLANT
SUT VIC AB 3-0 SH 27 (SUTURE) ×1
SUT VIC AB 3-0 SH 27X BRD (SUTURE) ×1 IMPLANT
SUT VICRYL AB 3 0 TIES (SUTURE) IMPLANT
SYR CONTROL 10ML LL (SYRINGE) ×2 IMPLANT
TOWEL OR 17X24 6PK STRL BLUE (TOWEL DISPOSABLE) ×2 IMPLANT
TOWEL OR NON WOVEN STRL DISP B (DISPOSABLE) ×2 IMPLANT
WATER STERILE IRR 1000ML POUR (IV SOLUTION) IMPLANT

## 2012-07-30 NOTE — Anesthesia Preprocedure Evaluation (Addendum)
Anesthesia Evaluation  Patient identified by MRN, date of birth, ID band Patient awake    Reviewed: Allergy & Precautions, H&P , NPO status , Patient's Chart, lab work & pertinent test results  Airway Mallampati: II TM Distance: >3 FB Neck ROM: Full    Dental No notable dental hx. (+) Teeth Intact and Dental Advisory Given   Pulmonary neg pulmonary ROS,  breath sounds clear to auscultation  Pulmonary exam normal       Cardiovascular hypertension, On Medications + CAD Rhythm:Regular Rate:Normal     Neuro/Psych negative neurological ROS  negative psych ROS   GI/Hepatic negative GI ROS, Neg liver ROS,   Endo/Other  negative endocrine ROS  Renal/GU Renal disease  negative genitourinary   Musculoskeletal   Abdominal   Peds  Hematology negative hematology ROS (+)   Anesthesia Other Findings   Reproductive/Obstetrics negative OB ROS                           Anesthesia Physical Anesthesia Plan  ASA: III  Anesthesia Plan: General and Regional   Post-op Pain Management:    Induction: Intravenous  Airway Management Planned: LMA  Additional Equipment:   Intra-op Plan:   Post-operative Plan: Extubation in OR  Informed Consent: I have reviewed the patients History and Physical, chart, labs and discussed the procedure including the risks, benefits and alternatives for the proposed anesthesia with the patient or authorized representative who has indicated his/her understanding and acceptance.   Dental advisory given  Plan Discussed with: CRNA  Anesthesia Plan Comments:         Anesthesia Quick Evaluation

## 2012-07-30 NOTE — Op Note (Signed)
Preoperative diagnosis: Left inguinal hernia Postoperative diagnosis: Pantaloon left inguinal hernia Procedure: Left inguinal hernia repair with Ultra Pro mesh Surgeon: Dr. Dwain Sarna Anesthesia: Gen. With LMA, Block Estimated blood loss: Minimal Specimens: None Drains: None Consultations: None Sponge needle count correct x2 at end of operation Disposition to recovery in stable condition  Indications: This is 79 year old active male who comes in with a symptomatic left inguinal bulge. On his exam he clearly has an inguinal hernia. He is not able to do was normal activity right now. We discussed an open inguinal hernia repair with mesh.  Procedure: After informed consent was obtained the patient first underwent a TAP block by anesthesia. He was given 2 g of intravenous cefazolin. Sequential compression devices were placed on his legs. He was placed under general anesthesia with an LMA. His left groin was then prepped and draped in the standard sterile surgical fashion. Surgical timeout was performed.  I infiltrated quarter percent Marcaine throughout the area of the incision. I made a left groin incision. I carried this to the external oblique. This was entered sharply through the external ring. I then encircled the spermatic cord with a Penrose drain. He had a direct hernia and a very weak floor. He also had a cord lipoma and a small inguinal hernia. I excised the cord lipoma and reduced the indirect hernia. I then used 2-0 Vicryl to bring the internal oblique down to the shelving edge. I then placed in UltraPro mesh patch overlying the area. I made a cut in this and wrapped around the spermatic cord. I sutured this with 2-0 Prolene to the pubic tubercle in 2 positions. I sutured it to the inguinal ligament every centimeter with 2-0 Prolene as well. Superiorly I sutured this to the internal oblique.. I tacked the cut ends together as well as to the oblique to just leave enough room for the spermatic  cord. The lateral portion was laid flat under the external oblique. This was in good position. Hemostasis was observed. I closed the external oblique with a 2-0 Vicryl. Scarpa's fascia was closed with a 3-0 Vicryl and the skin was closed with a 4-0 Monocryl. Dermabond and steristrips were placed on the wound. He tolerated this well was extubated and transferred to the recovery room in stable condition.

## 2012-07-30 NOTE — Anesthesia Postprocedure Evaluation (Signed)
  Anesthesia Post-op Note  Patient: Andrew [name]  Procedure(s) Performed: Procedure(s) (LRB) with comments: HERNIA REPAIR INGUINAL ADULT (Left) - Left Hernia Site INSERTION OF MESH (Left) - Left Inguinal Hernia  Patient Location: PACU  Anesthesia Type:GA combined with regional for post-op pain  Level of Consciousness: awake, alert  and oriented  Airway and Oxygen Therapy: Patient Spontanous Breathing  Post-op Pain: mild  Post-op Assessment: Post-op Vital signs reviewed, Patient's Cardiovascular Status Stable, Respiratory Function Stable, Patent Airway and No signs of Nausea or vomiting  Post-op Vital Signs: Reviewed and stable  Complications: No apparent anesthesia complications

## 2012-07-30 NOTE — Anesthesia Procedure Notes (Addendum)
Anesthesia Regional Block:  TAP block  Pre-Anesthetic Checklist: ,, timeout performed, Correct Patient, Correct Site, Correct Laterality, Correct Procedure, Correct Position, site marked, Risks and benefits discussed, pre-op evaluation, post-op pain management  Laterality: Left  Prep: chloraprep       Needles:  Injection technique: Single-shot  Needle Type: Echogenic Stimulator Needle          Additional Needles:  Procedures: ultrasound guided (picture in chart) TAP block Narrative:  Start time: 07/30/2012 11:44 AM End time: 07/30/2012 11:52 AM Injection made incrementally with aspirations every 5 mL.  Performed by: Personally  Anesthesiologist: Fitzgerald,MD  Additional Notes: 2% Lidocaine for skin.   Procedure Name: LMA Insertion Date/Time: 07/30/2012 12:16 PM Performed by: Meyer Russel Pre-anesthesia Checklist: Patient identified, Emergency Drugs available, Suction available and Patient being monitored Patient Re-evaluated:Patient Re-evaluated prior to inductionOxygen Delivery Method: Circle System Utilized Preoxygenation: Pre-oxygenation with 100% oxygen Intubation Type: IV induction Ventilation: Mask ventilation without difficulty LMA: LMA inserted LMA Size: 4.0 Number of attempts: 1 Airway Equipment and Method: bite block Placement Confirmation: positive ETCO2 and breath sounds checked- equal and bilateral Tube secured with: Tape Dental Injury: Teeth and Oropharynx as per pre-operative assessment

## 2012-07-30 NOTE — Transfer of Care (Signed)
Immediate Anesthesia Transfer of Care Note  Patient: Andrew [name]  Procedure(s) Performed: Procedure(s) (LRB) with comments: HERNIA REPAIR INGUINAL ADULT (Left) - Left Hernia Site INSERTION OF MESH (Left) - Left Inguinal Hernia  Patient Location: PACU  Anesthesia Type:GA combined with regional for post-op pain  Level of Consciousness: awake, oriented and patient cooperative  Airway & Oxygen Therapy: Patient Spontanous Breathing and Patient connected to face mask oxygen  Post-op Assessment: Report given to PACU RN, Post -op Vital signs reviewed and stable and Patient moving all extremities  Post vital signs: Reviewed and stable  Complications: No apparent anesthesia complications

## 2012-07-30 NOTE — H&P (View-Only) (Signed)
Patient ID: Andrew [name], male   DOB: 09/21/1931, 79 y.o.   MRN: [id number]  Chief Complaint  Patient presents with  . New Evaluation    L/H    HPI Andrew [name] is a 79 y.o. male.  Referred by Dr. Tenny Craw HPI This is a 79 year old male who is otherwise fairly healthy. He has had a coronary artery bypass in 2010 from which he is doing very well. He has been seen by Dr. Donnie Aho recently who has stated he does not need any further evaluation prior to any planned surgery. Over the past several weeks he noticed a left groin mass. He has a history of chronic left hip pain that has been treated a number of ways but this is separate from what he currently feels. He notes a small amount of discomfort at this region. He is still very active and works with [employer] for [employer] and this is  somewhat preventing him from doing that right now. He comes in today to discuss a possible left inguinal hernia repair. He complains of no changes in his bowel movements, nausea vomiting, or any other symptoms associated with this. He does have a history of a prior right-sided hernia in the early 1980s.  HTN, Chronic hip pain, CAD, Hypercholesterolemia  Past Surgical History  Procedure Date  . Hernia repair     rih 1981  . Coronary artery bypass graft     2010  . Spine surgery     lumbar laminectomy    History reviewed. No pertinent family history.  Social History History  Substance Use Topics  . Smoking status: Former Smoker    Quit date: 07/24/1984  . Smokeless tobacco: Not on file  . Alcohol Use: No    No Known Allergies  Current Outpatient Prescriptions  Medication Sig Dispense Refill  . aspirin 81 MG tablet Take 81 mg by mouth daily.      Marland Kitchen atorvastatin (LIPITOR) 20 MG tablet       . BOOSTRIX 5-2.5-18.5 injection       . calcium acetate (PHOSLO) 667 MG capsule Take 667 mg by mouth 3 (three) times daily with meals.      . calcium-vitamin D 250-100 MG-UNIT per tablet Take by mouth once. Vitamin  D 3 2,500      . co-enzyme Q-10 50 MG capsule Take 50 mg by mouth daily.      Marland Kitchen diltiazem (DILACOR XR) 180 MG 24 hr capsule       . fish oil-omega-3 fatty acids 1000 MG capsule Take 2 g by mouth daily.      Marland Kitchen gabapentin (NEURONTIN) 300 MG capsule       . multivitamin-lutein (OCUVITE-LUTEIN) CAPS Take 1 capsule by mouth daily.      . vitamin B-12 (CYANOCOBALAMIN) 250 MCG tablet Take 1,600 mcg by mouth daily.      . vitamin C (ASCORBIC ACID) 500 MG tablet Take 500 mg by mouth daily.      . vitamin A 40981 UNIT capsule Take 10,000 Units by mouth daily.        Review of Systems Review of Systems  Constitutional: Negative for fever, chills and unexpected weight change.  HENT: Negative for hearing loss, congestion, sore throat, trouble swallowing and voice change.   Eyes: Negative for visual disturbance.  Respiratory: Negative for cough and wheezing.   Cardiovascular: Negative for chest pain, palpitations and leg swelling.  Gastrointestinal: Negative for nausea, vomiting, abdominal pain, diarrhea, constipation, blood in stool, abdominal distention,  anal bleeding and rectal pain.  Genitourinary: Negative for hematuria and difficulty urinating.  Musculoskeletal: Negative for arthralgias.  Skin: Negative for rash and wound.  Neurological: Negative for seizures, syncope, weakness and headaches.  Hematological: Negative for adenopathy. Bruises/bleeds easily.  Psychiatric/Behavioral: Negative for confusion.    Blood pressure 136/76, pulse 60, temperature 97.7 F (36.5 C), temperature source Temporal, resp. rate 18, height 5\' 9"  (1.753 m), weight 156 lb (70.761 kg).  Physical Exam Physical Exam  Constitutional: He appears well-developed and well-nourished.  Cardiovascular: Normal rate, regular rhythm and normal heart sounds.   Pulmonary/Chest: Effort normal and breath sounds normal. He has no wheezes. He has no rales.    Abdominal: Soft. There is no tenderness. A hernia is present. Hernia  confirmed positive in the left inguinal area (small, easily reducible, nontender).     Assessment    LIH     Plan    We discussed observation versus repair.  We discussed both laparoscopic and open inguinal hernia repairs. I described the procedure in detail.  The patient was given educational material.  Goals should be achieved with surgery. We discussed the usage of mesh and the rationale behind that. We went over the pathophysiology of inguinal hernia. We have elected to perform open inguinal hernia repair with mesh.  We discussed the risks including bleeding, infection, recurrence, postoperative pain and chronic groin pain, testicular injury, urinary retention, numbness in groin and around incision.       Shyne Resch 07/01/2012, 5:18 PM

## 2012-07-30 NOTE — Interval H&P Note (Signed)
History and Physical Interval Note:  07/30/2012 11:53 AM  Andrew [name]  has presented today for surgery, with the diagnosis of Left inguinal hernia  The various methods of treatment have been discussed with the patient and family. After consideration of risks, benefits and other options for treatment, the patient has consented to  Procedure(s) (LRB) with comments: HERNIA REPAIR INGUINAL ADULT (Left) INSERTION OF MESH (Left) as a surgical intervention .  The patient's history has been reviewed, patient examined, no change in status, stable for surgery.  I have reviewed the patient's chart and labs.  Questions were answered to the patient's satisfaction.     [name]

## 2012-08-01 ENCOUNTER — Encounter (HOSPITAL_BASED_OUTPATIENT_CLINIC_OR_DEPARTMENT_OTHER): Payer: Self-pay | Admitting: General Surgery

## 2012-08-21 ENCOUNTER — Ambulatory Visit (INDEPENDENT_AMBULATORY_CARE_PROVIDER_SITE_OTHER): Payer: Medicare Other | Admitting: General Surgery

## 2012-08-21 ENCOUNTER — Encounter (INDEPENDENT_AMBULATORY_CARE_PROVIDER_SITE_OTHER): Payer: Self-pay | Admitting: General Surgery

## 2012-08-21 ENCOUNTER — Encounter (INDEPENDENT_AMBULATORY_CARE_PROVIDER_SITE_OTHER): Payer: Self-pay

## 2012-08-21 VITALS — BP 122/70 | HR 78 | Resp 16 | Ht 69.5 in | Wt 159.0 lb

## 2012-08-21 DIAGNOSIS — Z09 Encounter for follow-up examination after completed treatment for conditions other than malignant neoplasm: Secondary | ICD-10-CM

## 2012-08-21 NOTE — Progress Notes (Signed)
Subjective:     Patient ID: Andrew [name], male   DOB: Sep 16, 1931, 79 y.o.   MRN: [id number]  HPI 89 yom s/p lih repair three weeks ago who is doing well without complaint.  He is ambulating well.  Eating well.  He comes in today for postop check  Review of Systems     Objective:   Physical Exam Healing left groin incision without infection    Assessment:     S/p [facility] repair    Plan:     He can begin returning to full activity.  I will see him back as needed.

## 2013-05-08 DIAGNOSIS — G588 Other specified mononeuropathies: Secondary | ICD-10-CM | POA: Insufficient documentation

## 2013-06-24 ENCOUNTER — Other Ambulatory Visit: Payer: Self-pay | Admitting: Anesthesiology

## 2013-06-24 DIAGNOSIS — G894 Chronic pain syndrome: Secondary | ICD-10-CM

## 2013-06-24 DIAGNOSIS — IMO0001 Reserved for inherently not codable concepts without codable children: Secondary | ICD-10-CM

## 2013-06-28 ENCOUNTER — Ambulatory Visit
Admission: RE | Admit: 2013-06-28 | Discharge: 2013-06-28 | Disposition: A | Discharge status: Home / Self Care | Payer: Medicare Other | Source: Ambulatory Visit | Attending: Anesthesiology | Admitting: Anesthesiology

## 2013-06-28 DIAGNOSIS — G894 Chronic pain syndrome: Secondary | ICD-10-CM

## 2013-06-28 DIAGNOSIS — IMO0001 Reserved for inherently not codable concepts without codable children: Secondary | ICD-10-CM

## 2013-06-28 MED ORDER — GADOBENATE DIMEGLUMINE 529 MG/ML IV SOLN
15.0000 mL | Freq: Once | INTRAVENOUS | Status: AC | PRN
  Administered 2013-06-28: 15 mL via INTRAVENOUS

## 2013-07-18 ENCOUNTER — Emergency Department (HOSPITAL_COMMUNITY)
Admission: EM | Admit: 2013-07-18 | Discharge: 2013-07-19 | Discharge status: Against Medical Advice | Payer: Medicare Other | Attending: Emergency Medicine | Admitting: Emergency Medicine

## 2013-07-18 ENCOUNTER — Encounter (HOSPITAL_COMMUNITY): Payer: Self-pay | Admitting: Emergency Medicine

## 2013-07-18 DIAGNOSIS — Z951 Presence of aortocoronary bypass graft: Secondary | ICD-10-CM | POA: Insufficient documentation

## 2013-07-18 DIAGNOSIS — R339 Retention of urine, unspecified: Secondary | ICD-10-CM | POA: Insufficient documentation

## 2013-07-18 DIAGNOSIS — I129 Hypertensive chronic kidney disease with stage 1 through stage 4 chronic kidney disease, or unspecified chronic kidney disease: Secondary | ICD-10-CM | POA: Insufficient documentation

## 2013-07-18 DIAGNOSIS — I2510 Atherosclerotic heart disease of native coronary artery without angina pectoris: Secondary | ICD-10-CM | POA: Insufficient documentation

## 2013-07-18 DIAGNOSIS — N189 Chronic kidney disease, unspecified: Secondary | ICD-10-CM | POA: Insufficient documentation

## 2013-07-18 NOTE — ED Notes (Signed)
100 ml of clear, yellow urine emptied from drainage bag. Bladder scanner reads with readings from 0 ml-38 ml.

## 2013-07-18 NOTE — ED Notes (Addendum)
Pt reports being a pt of the pain clinic at [facility]. Pt reports buttocks and testicle pain. Pt presents with a complaint of hematuria and urinary retention. Pt states he was evaluated for urinary retention on December 8th with his urologist for urinary retention. Pt states they placed a catheter, which is still in place. Pt reports his output today has been minimal and had a bloody appearance. Pt reports some additional output since arriving to the ED, however still a very small quantity.

## 2013-07-19 NOTE — ED Notes (Signed)
Pt left ED at this time.

## 2013-07-28 DIAGNOSIS — N319 Neuromuscular dysfunction of bladder, unspecified: Secondary | ICD-10-CM | POA: Insufficient documentation

## 2013-07-28 DIAGNOSIS — N312 Flaccid neuropathic bladder, not elsewhere classified: Secondary | ICD-10-CM | POA: Insufficient documentation

## 2013-08-05 DIAGNOSIS — M4800 Spinal stenosis, site unspecified: Secondary | ICD-10-CM | POA: Insufficient documentation

## 2013-10-01 DIAGNOSIS — M5416 Radiculopathy, lumbar region: Secondary | ICD-10-CM | POA: Insufficient documentation

## 2013-10-28 DIAGNOSIS — I10 Essential (primary) hypertension: Secondary | ICD-10-CM | POA: Insufficient documentation

## 2013-10-30 ENCOUNTER — Other Ambulatory Visit: Payer: Self-pay | Admitting: Internal Medicine

## 2013-10-30 ENCOUNTER — Ambulatory Visit
Admission: RE | Admit: 2013-10-30 | Discharge: 2013-10-30 | Disposition: A | Discharge status: Home / Self Care | Payer: Medicare Other | Source: Ambulatory Visit | Attending: Internal Medicine | Admitting: Internal Medicine

## 2013-10-30 DIAGNOSIS — M7989 Other specified soft tissue disorders: Secondary | ICD-10-CM

## 2014-03-09 DIAGNOSIS — M4316 Spondylolisthesis, lumbar region: Secondary | ICD-10-CM | POA: Insufficient documentation

## 2014-03-23 ENCOUNTER — Other Ambulatory Visit: Payer: Self-pay | Admitting: Urology

## 2014-03-23 DIAGNOSIS — N4340 Spermatocele of epididymis, unspecified: Secondary | ICD-10-CM | POA: Insufficient documentation

## 2014-03-24 ENCOUNTER — Ambulatory Visit
Admission: RE | Admit: 2014-03-24 | Discharge: 2014-03-24 | Disposition: A | Discharge status: Home / Self Care | Payer: Medicare Other | Source: Ambulatory Visit | Attending: Urology | Admitting: Urology

## 2014-03-24 DIAGNOSIS — N4340 Spermatocele of epididymis, unspecified: Secondary | ICD-10-CM

## 2014-12-15 DIAGNOSIS — M6780 Other specified disorders of synovium and tendon, unspecified site: Secondary | ICD-10-CM | POA: Insufficient documentation

## 2015-01-06 ENCOUNTER — Other Ambulatory Visit: Payer: Self-pay | Admitting: Gastroenterology

## 2015-01-06 DIAGNOSIS — R1310 Dysphagia, unspecified: Secondary | ICD-10-CM

## 2015-01-06 DIAGNOSIS — R112 Nausea with vomiting, unspecified: Secondary | ICD-10-CM

## 2015-01-06 DIAGNOSIS — R140 Abdominal distension (gaseous): Secondary | ICD-10-CM

## 2015-01-08 ENCOUNTER — Other Ambulatory Visit: Payer: Self-pay

## 2015-01-26 ENCOUNTER — Other Ambulatory Visit: Payer: Self-pay

## 2015-01-28 ENCOUNTER — Ambulatory Visit
Admission: RE | Admit: 2015-01-28 | Discharge: 2015-01-28 | Disposition: A | Discharge status: Home / Self Care | Payer: Medicare Other | Source: Ambulatory Visit | Attending: Gastroenterology | Admitting: Gastroenterology

## 2015-01-28 DIAGNOSIS — R112 Nausea with vomiting, unspecified: Secondary | ICD-10-CM

## 2015-01-28 DIAGNOSIS — R1310 Dysphagia, unspecified: Secondary | ICD-10-CM

## 2015-01-28 DIAGNOSIS — R140 Abdominal distension (gaseous): Secondary | ICD-10-CM

## 2015-02-09 DIAGNOSIS — Z09 Encounter for follow-up examination after completed treatment for conditions other than malignant neoplasm: Secondary | ICD-10-CM | POA: Insufficient documentation

## 2015-05-19 ENCOUNTER — Emergency Department (HOSPITAL_COMMUNITY)
Admission: EM | Admit: 2015-05-19 | Discharge: 2015-05-19 | Disposition: A | Discharge status: Home / Self Care | Payer: Medicare Other | Attending: Emergency Medicine | Admitting: Emergency Medicine

## 2015-05-19 DIAGNOSIS — M1990 Unspecified osteoarthritis, unspecified site: Secondary | ICD-10-CM | POA: Insufficient documentation

## 2015-05-19 DIAGNOSIS — Z7982 Long term (current) use of aspirin: Secondary | ICD-10-CM | POA: Insufficient documentation

## 2015-05-19 DIAGNOSIS — I129 Hypertensive chronic kidney disease with stage 1 through stage 4 chronic kidney disease, or unspecified chronic kidney disease: Secondary | ICD-10-CM | POA: Diagnosis not present

## 2015-05-19 DIAGNOSIS — X58XXXA Exposure to other specified factors, initial encounter: Secondary | ICD-10-CM | POA: Insufficient documentation

## 2015-05-19 DIAGNOSIS — Z951 Presence of aortocoronary bypass graft: Secondary | ICD-10-CM | POA: Diagnosis not present

## 2015-05-19 DIAGNOSIS — I2510 Atherosclerotic heart disease of native coronary artery without angina pectoris: Secondary | ICD-10-CM | POA: Insufficient documentation

## 2015-05-19 DIAGNOSIS — T7840XA Allergy, unspecified, initial encounter: Secondary | ICD-10-CM

## 2015-05-19 DIAGNOSIS — Z79899 Other long term (current) drug therapy: Secondary | ICD-10-CM | POA: Diagnosis not present

## 2015-05-19 DIAGNOSIS — R220 Localized swelling, mass and lump, head: Secondary | ICD-10-CM | POA: Diagnosis present

## 2015-05-19 DIAGNOSIS — R011 Cardiac murmur, unspecified: Secondary | ICD-10-CM | POA: Insufficient documentation

## 2015-05-19 DIAGNOSIS — Y9389 Activity, other specified: Secondary | ICD-10-CM | POA: Insufficient documentation

## 2015-05-19 DIAGNOSIS — Y9289 Other specified places as the place of occurrence of the external cause: Secondary | ICD-10-CM | POA: Insufficient documentation

## 2015-05-19 DIAGNOSIS — N189 Chronic kidney disease, unspecified: Secondary | ICD-10-CM | POA: Insufficient documentation

## 2015-05-19 DIAGNOSIS — Y998 Other external cause status: Secondary | ICD-10-CM | POA: Insufficient documentation

## 2015-05-19 DIAGNOSIS — T783XXA Angioneurotic edema, initial encounter: Secondary | ICD-10-CM | POA: Insufficient documentation

## 2015-05-19 DIAGNOSIS — Z87891 Personal history of nicotine dependence: Secondary | ICD-10-CM | POA: Diagnosis not present

## 2015-05-19 MED ORDER — FAMOTIDINE 20 MG PO TABS: 20.0000 mg | ORAL_TABLET | Freq: Two times a day (BID) | ORAL | Status: DC

## 2015-05-19 MED ORDER — FAMOTIDINE IN NACL 20-0.9 MG/50ML-% IV SOLN
20.0000 mg | Freq: Once | INTRAVENOUS | Status: AC
  Administered 2015-05-19: 20 mg via INTRAVENOUS
  Filled 2015-05-19: qty 50

## 2015-05-19 MED ORDER — PREDNISONE 50 MG PO TABS: 50.0000 mg | ORAL_TABLET | Freq: Every day | ORAL | Status: DC

## 2015-05-19 MED ORDER — METHYLPREDNISOLONE SODIUM SUCC 125 MG IJ SOLR
125.0000 mg | Freq: Once | INTRAMUSCULAR | Status: AC
  Administered 2015-05-19: 125 mg via INTRAVENOUS
  Filled 2015-05-19: qty 2

## 2015-05-19 NOTE — ED Notes (Signed)
Bed: WA02 Expected date:  Expected time:  Means of arrival:  Comments: Ems- angioedema

## 2015-05-19 NOTE — ED Provider Notes (Signed)
CSN: [id number]     Arrival date & time 05/19/15  1507 History   First MD Initiated Contact with Patient 05/19/15 1512     No chief complaint on file.    (Consider location/radiation/quality/duration/timing/severity/associated sxs/prior Treatment) HPI   Andrew [name] is an 81 yo Caucasian male who presents to the Emergency Department for evaluation of angioedema on the right side of tongue. Pt states he was volunteering at [employer] and noticed his right tongue swelling up shortly after eating PBJ sandwich. Pt reports he has never developed a reaction to PB. 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 He took his usual medications this morning, Diltiazem and Lipitor. Denies dizziness, h/a, fevers, chills, rash. Pt went to see his PCP at [place] this afternoon and was given Benadryl 50 mg IM and subq ep 0.3 ml without relief. Denies allergies or starting new medications.   Past Medical History  Diagnosis Date  . Hypertension   . Coronary artery disease   . Arthritis   . Back pain   . Complication of anesthesia     had to have Foley post op cabg and lumb lam  . Wears glasses   . Chronic kidney disease     ? bph   Past Surgical History  Procedure Laterality Date  . Hernia repair      rih 1981  . Coronary artery bypass graft      2010  . Spine surgery      lumbar laminectomy  . Tonsillectomy    . Colonoscopy    . Back surgery  2012    lumb lam  . Inguinal hernia repair  07/30/2012    Procedure: HERNIA REPAIR INGUINAL ADULT;  Surgeon: Rolm Bookbinder, MD;  Location: [place];  Service: General;  Laterality: Left;  Left Hernia Site  . Insertion of mesh  07/30/2012    Procedure: INSERTION OF MESH;  Surgeon: Rolm Bookbinder, MD;  Location: [place];  Service: General;  Laterality: Left;  Left Inguinal Hernia   No family history on file. Social History  Substance Use Topics  .  Smoking status: Former Smoker    Quit date: 07/24/1984  . Smokeless tobacco: Not on file  . Alcohol Use: No    Review of Systems  All other systems negative except as documented in the HPI. All pertinent positives and negatives as reviewed in the HPI.  Allergies  Review of patient's allergies indicates no known allergies.  Home Medications   Prior to Admission medications   Medication Sig Start Date End Date Taking? Authorizing Provider  aspirin 81 MG tablet Take 81 mg by mouth daily.    Historical Provider, MD  atorvastatin (LIPITOR) 20 MG tablet  06/24/12   Historical Provider, MD  calcium acetate (PHOSLO) 667 MG capsule Take 667 mg by mouth 3 (three) times daily with meals.    Historical Provider, MD  calcium-vitamin D 250-100 MG-UNIT per tablet Take by mouth once. Vitamin D 3 2,500    Historical Provider, MD  co-enzyme Q-10 50 MG capsule Take 50 mg by mouth daily.    Historical Provider, MD  diltiazem (DILACOR XR) 180 MG 24 hr capsule  06/24/12   Historical Provider, MD  fish oil-omega-3 fatty acids 1000 MG capsule Take 2 g by mouth daily.    Historical Provider, MD  gabapentin (NEURONTIN) 300 MG capsule  04/18/12   Historical Provider, MD  multivitamin-lutein (OCUVITE-LUTEIN) CAPS Take 1 capsule by mouth daily.    Historical Provider, MD  oxyCODONE-acetaminophen (ROXICET) 5-325 MG per tablet Take 1 tablet by mouth every 4 (four) hours as needed for pain. 07/30/12   Rolm Bookbinder, MD  vitamin A 10000 UNIT capsule Take 10,000 Units by mouth daily.    Historical Provider, MD  vitamin B-12 (CYANOCOBALAMIN) 250 MCG tablet Take 1,600 mcg by mouth daily.    Historical Provider, MD  vitamin C (ASCORBIC ACID) 500 MG tablet Take 500 mg by mouth daily.    Historical Provider, MD   BP 129/69 mmHg  Pulse 73  Temp(Src) 98.4 F (36.9 C)  Resp 14  SpO2 96% Physical Exam  Constitutional: He is oriented to person, place, and time. He appears well-developed and well-nourished. No distress.   HENT:  Head: Normocephalic and atraumatic.  Mouth/Throat: Oropharynx is clear and moist.  Tongue is mildly swollen on the right.  Eyes: Pupils are equal, round, and reactive to light.  Neck: Normal range of motion. Neck supple.  Cardiovascular: Normal rate and regular rhythm.  Exam reveals no gallop and no friction rub.   Murmur heard. Murmur heard over LUSB.  Pulmonary/Chest: Effort normal and breath sounds normal. No respiratory distress. He has no wheezes. He has no rales.  Abdominal: Soft. Bowel sounds are normal. He exhibits no distension and no mass. There is no tenderness. There is no rebound and no guarding.  Musculoskeletal: Normal range of motion.  Neurological: He is alert and oriented to person, place, and time.  Skin: Skin is warm. No rash noted. No erythema. No pallor.   ED Course  Procedures (including critical care time) Labs Review Labs Reviewed - No data to display  Imaging Review No results found. I have personally reviewed and evaluated these images and lab results as part of my medical decision-making.   EKG Interpretation None      5:35pm - Pt remains stable. He states he feels about the same. Pt was administered methylprednisone and famotidine without noticeable relief. Angioedema on the right side of tongue is about the same.   7 p.m.the patient is feeling considerably better and was reassessed and the swelling is almost completely gone.  Patient is advised follow-up with her primary care Dr. told to return here as needed  Dalia Heading, PA-C 05/19/15 1924  Forde Dandy, MD 05/19/15 [place] Liu, MD 05/19/15 [phone]

## 2015-05-19 NOTE — Discharge Instructions (Signed)
Return here as needed. Follow up with your PCP. Take benadryl as well.

## 2015-05-19 NOTE — ED Notes (Signed)
Per [facility], pt. Ate a PBJ sandwich @ 8325 and went to see [facility] for angioedema on right side of tongue. Pt was given benadryl 50mg  IM and subq epi 0.3 ml. VSS, no rash or hives, patent airway- 99% on RA.

## 2015-06-01 ENCOUNTER — Encounter: Payer: Self-pay | Admitting: Allergy and Immunology

## 2015-06-01 ENCOUNTER — Ambulatory Visit (INDEPENDENT_AMBULATORY_CARE_PROVIDER_SITE_OTHER): Payer: Medicare Other | Admitting: Allergy and Immunology

## 2015-06-01 VITALS — BP 122/68 | HR 68 | Temp 97.7°F | Resp 16 | Ht 68.5 in | Wt 152.8 lb

## 2015-06-01 DIAGNOSIS — T783XXA Angioneurotic edema, initial encounter: Secondary | ICD-10-CM

## 2015-06-01 DIAGNOSIS — T7840XD Allergy, unspecified, subsequent encounter: Secondary | ICD-10-CM | POA: Diagnosis not present

## 2015-06-01 DIAGNOSIS — T783XXD Angioneurotic edema, subsequent encounter: Secondary | ICD-10-CM

## 2015-06-01 DIAGNOSIS — T7840XA Allergy, unspecified, initial encounter: Secondary | ICD-10-CM

## 2015-06-01 HISTORY — DX: Angioneurotic edema, initial encounter: T78.3XXA

## 2015-06-01 NOTE — Progress Notes (Signed)
History of present illness: HPI Comments: Andrew [name] is a 81 y.o. male Who presents today for his initial consultation. Two weeks ago he experienced swelling of the right side of his tongue.  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  He states that there were many insects, which he believes were yellow jackets, flying around him and his meal at the time. He denies concomitant cardiopulmonary, cutaneous, or other GI symptoms. He was not taking an ACE inhibitor or ARB at the time.   Assessment and plan: Allergic reaction Allergic reaction versus idiopathic angioedema.  If it was an IgE mediated allergic reaction, most likely due to hymenoptera sting or peanut butter.  Skin tests today was negative to peanut however there is still a 5% chance that the allergy exists.  A lab order has been provided for serum specific IgE against peanut, peanut components, and hymenoptera venom panel, as well as C4 level, tryptase, and galactose-alpha-1,3-galactose IgE level.  For now, continue to avoid peanuts and insects.  Should symptoms recur, a journal is to be kept recording any foods eaten, beverages consumed, and medications taken within a 6 hour time period prior to the onset of symptoms, as well as record activities being performed, and environmental conditions. For any symptoms concerning for anaphylaxis, 911 is to be called immediately.  A prescription for epinephrine autoinjector 2 pack has been offered but refused by the patient.    Medications ordered this encounter: No orders of the defined types were placed in this encounter.    Diagnositics: Food allergen skin testing: Negative despite a positive histamine control.    Physical examination: Blood pressure  122/68, pulse 68, temperature 97.7 F (36.5 C), resp. rate 16, height 5' 8.5" (1.74 m), weight 152 lb 12.5 oz (69.3 kg).  General: Alert, interactive, in no acute distress. Lungs: Clear to auscultation without wheezing, rhonchi or rales. CV: Normal S1, S2 without murmurs. Abdomen: Nondistended, nontender. Skin: Warm and dry, without lesions or rashes. Extremities:  No clubbing, cyanosis or edema. Neuro:   Grossly intact.  Review of systems: Review of Systems  Constitutional: Negative for fever, chills and weight loss.  HENT: Negative for nosebleeds.   Eyes: Negative for blurred vision.  Respiratory: Negative for hemoptysis.   Cardiovascular: Negative for chest pain.  Gastrointestinal: Negative for diarrhea and constipation.  Genitourinary: Negative for dysuria.  Musculoskeletal: Negative for myalgias and joint pain.  Neurological: Negative for dizziness.  Endo/Heme/Allergies: Does not bruise/bleed easily.    Past medical history: Past Medical History  Diagnosis Date  . Hypertension   . Coronary artery disease   . Arthritis   . Back pain   . Complication of anesthesia     had to have Foley post op cabg and lumb lam  . Wears glasses   . Chronic kidney disease     ? bph  . Angioedema 06/01/2015    Past surgical history: Past Surgical History  Procedure Laterality Date  . Hernia repair      rih 1981  . Coronary artery bypass graft      2010  . Spine surgery      lumbar laminectomy  . Tonsillectomy    . Colonoscopy    . Back surgery  2012    lumb lam  .  Inguinal hernia repair  07/30/2012    Procedure: HERNIA REPAIR INGUINAL ADULT;  Surgeon: Rolm Bookbinder, MD;  Location: [place];  Service: General;  Laterality: Left;  Left Hernia Site  . Insertion of mesh  07/30/2012    Procedure: INSERTION OF MESH;  Surgeon: Rolm Bookbinder, MD;  Location: [place];  Service: General;  Laterality: Left;  Left Inguinal Hernia    Family  history: Family History  Problem Relation Age of Onset  . Angioedema Neg Hx   . Asthma Neg Hx   . Atopy Neg Hx   . Eczema Neg Hx   . Immunodeficiency Neg Hx   . Urticaria Neg Hx   . Allergic rhinitis Son     Social history: Social History   Social History  . Marital Status: Married    Spouse Name: N/A  . Number of Children: N/A  . Years of Education: N/A   Occupational History  . Not on file.   Social History Main Topics  . Smoking status: Former Smoker    Quit date: 07/24/1984  . Smokeless tobacco: Not on file  . Alcohol Use: No  . Drug Use: Not on file  . Sexual Activity: Not on file   Other Topics Concern  . Not on file   Social History Narrative   Environmental History:  Stanislaw lives in an 92-year-old condominium with hardwood floors throughout and central air/heat.  He is a former smoker without pets.  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  Known medication allergies: No Known Allergies  Outpatient medications:   Medication List       This list is accurate as of: 06/01/15 10:41 AM.  Always use your most recent med list.               aspirin 81 MG tablet  Take 81 mg by mouth daily.     atorvastatin 20 MG tablet  Commonly known as:  LIPITOR  Take 20 mg by mouth daily at 6 PM.     beta carotene w/minerals tablet  Take 1 tablet by mouth daily.     calcium-vitamin D 250-100 MG-UNIT tablet  Take 1 tablet by mouth daily.     co-enzyme Q-10 50 MG capsule  Take 50 mg by mouth daily.     diltiazem 180 MG 24 hr capsule  Commonly known as:  DILACOR XR  Take 180 mg by mouth daily.     famotidine 20 MG tablet  Commonly known as:  PEPCID  Take 1 tablet (20 mg total) by mouth 2 (two) times daily.     ferrous sulfate 325 (65 FE) MG tablet  Take 325 mg by mouth daily with breakfast.     fish oil-omega-3 fatty acids 1000 MG capsule  Take 1 g by mouth daily.     gabapentin 300 MG capsule  Commonly known as:   NEURONTIN  Take 300 mg by mouth at bedtime.     LUTEIN PO  Take 1 tablet by mouth every other day.     predniSONE 50 MG tablet  Commonly known as:  DELTASONE  Take 1 tablet (50 mg total) by mouth daily.     vitamin B-12 250 MCG tablet  Commonly known as:  CYANOCOBALAMIN  Take 250 mcg by mouth daily.     vitamin C 500 MG tablet  Commonly known as:  ASCORBIC ACID  Take 500 mg by mouth daily.  I appreciate the opportunity to take part in this Takumi's care. Please do not hesitate to contact me with questions.  Sincerely,   R. Edgar Frisk, MD

## 2015-06-01 NOTE — Assessment & Plan Note (Addendum)
Allergic reaction versus idiopathic angioedema.  If it was an IgE mediated allergic reaction, most likely due to hymenoptera sting or peanut butter.  Skin tests today was negative to peanut however there is still a 5% chance that the allergy exists.  A lab order has been provided for serum specific IgE against peanut, peanut components, and hymenoptera venom panel, as well as C4 level, tryptase, and galactose-alpha-1,3-galactose IgE level.  For now, continue to avoid peanuts and insects.  Should symptoms recur, a journal is to be kept recording any foods eaten, beverages consumed, and medications taken within a 6 hour time period prior to the onset of symptoms, as well as record activities being performed, and environmental conditions. For any symptoms concerning for anaphylaxis, 911 is to be called immediately.  A prescription for epinephrine autoinjector 2 pack has been offered but refused by the patient.

## 2015-06-01 NOTE — Patient Instructions (Addendum)
Allergic reaction Allergic reaction versus idiopathic angioedema.  If it was an IgE mediated allergic reaction, most likely due to hymenoptera sting or peanut butter.  Skin tests today was negative to peanut however there is still a 5% chance that the allergy exists.  A lab order has been provided for serum specific IgE against peanut, peanut components, and hymenoptera venom panel, as well as C4 level, tryptase, and galactose-alpha-1,3-galactose IgE level.  For now, continue to avoid peanuts and insects.  Should symptoms recur, a journal is to be kept recording any foods eaten, beverages consumed, and medications taken within a 6 hour time period prior to the onset of symptoms, as well as record activities being performed, and environmental conditions. For any symptoms concerning for anaphylaxis, epinephrine is to be administered and 911 is to be called immediately.    Return When lab results returned, the patient will be called with further recommendations and follow up instructions.

## 2015-06-02 LAB — ALLERGEN, PEANUT COMPONENT PANEL
Ara h 1 (f422): <0.1 kU/L
Ara h 2 (f423): <0.1 kU/L
Ara h 3 (f424): <0.1 kU/L
Ara h 8 (f352): <0.1 kU/L
Ara h 9 (f427: <0.1 kU/L

## 2015-06-02 LAB — ALLERGEN HYMENOPTERA PANEL
Honey Bee IgE: 0.75 kU/L — ABNORMAL HIGH
Paper Wasp IgE: <0.1 kU/L
White Hornet IgE: <0.1 kU/L
Yellow Hornet IgE: <0.1 kU/L
Yellow Jacket IgE: 0.5 kU/L — ABNORMAL HIGH

## 2015-06-02 LAB — ALLERGEN, PEANUT F13: Peanut IgE: <0.1 kU/L

## 2015-06-02 LAB — C4 COMPLEMENT: C4 Complement: 36 mg/dL (ref 10–40)

## 2015-06-04 LAB — TRYPTASE: Tryptase: 3.3 ug/L (ref <11)

## 2015-06-04 LAB — ALPHA-GAL PANEL
Allergen, Mutton, f88: <0.1 kU/L
Allergen, Pork, f26: <0.1 kU/L
Beef: <0.1 kU/L
Galactose-alpha-1,3-galactose IgE: <0.1 kU/L (ref <0.35)

## 2015-10-13 ENCOUNTER — Other Ambulatory Visit (HOSPITAL_COMMUNITY): Payer: Self-pay | Admitting: Podiatry

## 2015-10-13 DIAGNOSIS — D492 Neoplasm of unspecified behavior of bone, soft tissue, and skin: Secondary | ICD-10-CM

## 2015-10-19 ENCOUNTER — Ambulatory Visit (HOSPITAL_COMMUNITY)
Admission: RE | Admit: 2015-10-19 | Discharge: 2015-10-19 | Disposition: A | Discharge status: Home / Self Care | Payer: Medicare Other | Source: Ambulatory Visit | Attending: Podiatry | Admitting: Podiatry

## 2015-10-19 DIAGNOSIS — M7989 Other specified soft tissue disorders: Secondary | ICD-10-CM | POA: Diagnosis not present

## 2015-10-19 DIAGNOSIS — R2242 Localized swelling, mass and lump, left lower limb: Secondary | ICD-10-CM | POA: Insufficient documentation

## 2015-10-19 DIAGNOSIS — M25572 Pain in left ankle and joints of left foot: Secondary | ICD-10-CM | POA: Diagnosis present

## 2015-10-19 DIAGNOSIS — M19072 Primary osteoarthritis, left ankle and foot: Secondary | ICD-10-CM | POA: Insufficient documentation

## 2015-10-19 DIAGNOSIS — D492 Neoplasm of unspecified behavior of bone, soft tissue, and skin: Secondary | ICD-10-CM

## 2015-10-19 LAB — POCT I-STAT CREATININE: CREATININE: 1 mg/dL (ref 0.61–1.24)

## 2015-10-19 MED ORDER — GADOBENATE DIMEGLUMINE 529 MG/ML IV SOLN
15.0000 mL | Freq: Once | INTRAVENOUS | Status: AC | PRN
  Administered 2015-10-19: 14 mL via INTRAVENOUS

## 2016-02-21 DIAGNOSIS — N529 Male erectile dysfunction, unspecified: Secondary | ICD-10-CM | POA: Insufficient documentation

## 2016-09-20 ENCOUNTER — Ambulatory Visit (INDEPENDENT_AMBULATORY_CARE_PROVIDER_SITE_OTHER): Payer: Medicare Other | Admitting: Pulmonary Disease

## 2016-09-20 ENCOUNTER — Encounter: Payer: Self-pay | Admitting: Pulmonary Disease

## 2016-09-20 VITALS — BP 124/68 | HR 60 | Ht 69.0 in | Wt 156.2 lb

## 2016-09-20 DIAGNOSIS — R9389 Abnormal findings on diagnostic imaging of other specified body structures: Secondary | ICD-10-CM

## 2016-09-20 DIAGNOSIS — R938 Abnormal findings on diagnostic imaging of other specified body structures: Secondary | ICD-10-CM | POA: Diagnosis not present

## 2016-09-20 DIAGNOSIS — J849 Interstitial pulmonary disease, unspecified: Secondary | ICD-10-CM

## 2016-09-20 NOTE — Patient Instructions (Signed)
We will schedule for a high-resolution CT of the chest  Return to clinic in 1 month

## 2016-09-20 NOTE — Progress Notes (Signed)
Andrew [name]    Andrew [name]:[id number]    01-14-1932  Primary Care Physician:Andrew Harrington Challenger, MD  Referring Physician: [name], MD [place], [place] [address]  Chief complaint:   Consult evaluation of abnormal CT scan   HPI:  Andrew [name] is an 83 year old with past history of coronary artery disease status post CABG, hypertension. He was seen by his primary care office earlier this month for atypical left chest pain. He had a chest x-ray which showed bibasilar opacities suggestive of atelectasis versus fibrosis, probable rt small effusion. He has been referred here for further evaluation.  In office today he denies any chest pain, cough, sputum production, dyspnea, wheezing, hemoptysis. He has 8-pack-year smoking history quit in 1966. He used to work with tobacco company and does not report any history of exposure to asbestos or other toxins at work or at home. He denies any signs and symptoms of connective tissue/autoimmune disease.  Outpatient Encounter Prescriptions as of 09/20/2016  Medication Sig  . aspirin 81 MG tablet Take 81 mg by mouth daily.  Marland Kitchen atorvastatin (LIPITOR) 20 MG tablet Take 20 mg by mouth daily at 6 PM.   . beta carotene w/minerals (OCUVITE) tablet Take 1 tablet by mouth daily.  . calcium-vitamin D 250-100 MG-UNIT per tablet Take 1 tablet by mouth daily.   Marland Kitchen co-enzyme Q-10 50 MG capsule Take 50 mg by mouth daily.  Marland Kitchen diltiazem (DILACOR XR) 180 MG 24 hr capsule Take 180 mg by mouth daily.   . famotidine (PEPCID) 20 MG tablet Take 1 tablet (20 mg total) by mouth 2 (two) times daily.  . ferrous sulfate 325 (65 FE) MG tablet Take 325 mg by mouth daily with breakfast.  . fish oil-omega-3 fatty acids 1000 MG capsule Take 1 g by mouth daily.   Marland Kitchen gabapentin (NEURONTIN) 300 MG capsule Take 300 mg by mouth at bedtime.   . LUTEIN PO Take 1 tablet by mouth every other day.  . vitamin B-12 (CYANOCOBALAMIN) 250 MCG tablet Take 250 mcg by mouth daily.   .  vitamin C (ASCORBIC ACID) 500 MG tablet Take 500 mg by mouth daily.  . [DISCONTINUED] predniSONE (DELTASONE) 50 MG tablet Take 1 tablet (50 mg total) by mouth daily.   No facility-administered encounter medications on file as of 09/20/2016.     Allergies as of 09/20/2016  . (No Known Allergies)    Past Medical History:  Diagnosis Date  . Angioedema 06/01/2015  . Arthritis   . Back pain   . Chronic kidney disease    ? bph  . Complication of anesthesia    had to have Foley post op cabg and lumb lam  . Coronary artery disease   . Hypertension   . Wears glasses     Past Surgical History:  Procedure Laterality Date  . BACK SURGERY  2012   lumb lam  . COLONOSCOPY    . CORONARY ARTERY BYPASS GRAFT     2010  . HERNIA REPAIR     rih 1981  . INGUINAL HERNIA REPAIR  07/30/2012   Procedure: HERNIA REPAIR INGUINAL ADULT;  Surgeon: Andrew Bookbinder, MD;  Location: [facility];  Service: General;  Laterality: Left;  Left Hernia Site  . INSERTION OF MESH  07/30/2012   Procedure: INSERTION OF MESH;  Surgeon: Andrew Bookbinder, MD;  Location: [place];  Service: General;  Laterality: Left;  Left Inguinal Hernia  . SPINE  SURGERY     lumbar laminectomy  . TONSILLECTOMY      Family History  Problem Relation Age of Onset  . Allergic rhinitis Son   . Coronary artery disease Mother   . Angioedema Neg Hx   . Asthma Neg Hx   . Atopy Neg Hx   . Eczema Neg Hx   . Immunodeficiency Neg Hx   . Urticaria Neg Hx     Social History   Social History  . Marital status: Married    Spouse name: N/A  . Number of children: N/A  . Years of education: N/A   Occupational History  . Not on file.   Social History Main Topics  . Smoking status: Former Smoker    Packs/day: 1.00    Years: 8.00    Quit date: 07/24/1984  . Smokeless tobacco: Never Used  . Alcohol use No  . Drug use: No  . Sexual activity: Not on file   Other Topics Concern  . Not on file   Social  History Narrative  . No narrative on file    Review of systems: Review of Systems  Constitutional: Negative for fever and chills.  HENT: Negative.   Eyes: Negative for blurred vision.  Respiratory: as per HPI  Cardiovascular: Negative for chest pain and palpitations.  Gastrointestinal: Negative for vomiting, diarrhea, blood per rectum. Genitourinary: Negative for dysuria, urgency, frequency and hematuria.  Musculoskeletal: Negative for myalgias, back pain and joint pain.  Skin: Negative for itching and rash.  Neurological: Negative for dizziness, tremors, focal weakness, seizures and loss of consciousness.  Endo/Heme/Allergies: Negative for environmental allergies.  Psychiatric/Behavioral: Negative for depression, suicidal ideas and hallucinations.  All other systems reviewed and are negative.  Physical Exam: Blood pressure 124/68, pulse 60, height 5\' 9"  (1.753 m), weight 70.9 kg (156 lb 3.2 oz), SpO2 96 %. Gen:      No acute distress HEENT:  EOMI, sclera anicteric Neck:     No masses; no thyromegaly Lungs:   Scattered basal crackles; normal respiratory effort CV:         Regular rate and rhythm; no murmurs Abd:      + bowel sounds; soft, non-tender; no palpable masses, no distension Ext:    No edema; adequate peripheral perfusion Skin:      Warm and dry; no rash Neuro: alert and oriented x 3 Psych: normal mood and affect  Data Reviewed: CXR 09/20/16- bibasal opacities consistent with scarring or atelectasis. Probable small right loculated effusion. I have reviewed the image personally.  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 It's unclear if he has a right effusion or pleural thickening. I'll get a CT of the chest for further evaluation.   Plan/Recommendations: - High res CT of chest.  Andrew Garfinkel  MD [facility] Pager [phone] 09/20/2016, 2:50 PM  CC: Andrew Cruel, MD

## 2016-09-29 ENCOUNTER — Ambulatory Visit (INDEPENDENT_AMBULATORY_CARE_PROVIDER_SITE_OTHER)
Admission: RE | Admit: 2016-09-29 | Discharge: 2016-09-29 | Disposition: A | Discharge status: Home / Self Care | Payer: Medicare Other | Source: Ambulatory Visit | Attending: Pulmonary Disease | Admitting: Pulmonary Disease

## 2016-09-29 DIAGNOSIS — J849 Interstitial pulmonary disease, unspecified: Secondary | ICD-10-CM | POA: Diagnosis not present

## 2016-10-04 ENCOUNTER — Telehealth: Payer: Self-pay | Admitting: Pulmonary Disease

## 2016-10-04 NOTE — Telephone Encounter (Signed)
Notes Recorded by Marshell Garfinkel, MD on 10/02/2016 at 10:04 AM EDT Please let the patient know that the CT does not show any interstitial lung disease. There is changes of bronchiectasis and scarring that may be related to prior infection or asbestos exposure. I will discuss further with him on his clinic return visit. No change in management required.   He has coronary artery disease and dilation of aorta. Please ask him to follow up with his primary care regarding this. -------------------------------------------- Spoke with pt. His results have been relayed to him again. Nothing further was needed.

## 2016-10-11 ENCOUNTER — Institutional Professional Consult (permissible substitution): Payer: Medicare Other | Admitting: Pulmonary Disease

## 2016-10-19 ENCOUNTER — Ambulatory Visit: Payer: Medicare Other | Admitting: Pulmonary Disease

## 2017-08-24 ENCOUNTER — Other Ambulatory Visit: Payer: Self-pay | Admitting: General Surgery

## 2017-08-27 ENCOUNTER — Other Ambulatory Visit: Payer: Self-pay | Admitting: General Surgery

## 2017-09-05 ENCOUNTER — Encounter (HOSPITAL_COMMUNITY): Payer: Self-pay

## 2017-09-05 ENCOUNTER — Encounter (HOSPITAL_COMMUNITY)
Admission: RE | Admit: 2017-09-05 | Discharge: 2017-09-05 | Disposition: A | Discharge status: Home / Self Care | Payer: Medicare Other | Source: Ambulatory Visit | Attending: General Surgery | Admitting: General Surgery

## 2017-09-05 ENCOUNTER — Other Ambulatory Visit: Payer: Self-pay

## 2017-09-05 DIAGNOSIS — I25810 Atherosclerosis of coronary artery bypass graft(s) without angina pectoris: Secondary | ICD-10-CM | POA: Insufficient documentation

## 2017-09-05 DIAGNOSIS — Z7982 Long term (current) use of aspirin: Secondary | ICD-10-CM | POA: Insufficient documentation

## 2017-09-05 DIAGNOSIS — Z85828 Personal history of other malignant neoplasm of skin: Secondary | ICD-10-CM | POA: Diagnosis not present

## 2017-09-05 DIAGNOSIS — Z79899 Other long term (current) drug therapy: Secondary | ICD-10-CM | POA: Diagnosis not present

## 2017-09-05 DIAGNOSIS — Z01818 Encounter for other preprocedural examination: Secondary | ICD-10-CM | POA: Diagnosis present

## 2017-09-05 DIAGNOSIS — K439 Ventral hernia without obstruction or gangrene: Secondary | ICD-10-CM | POA: Insufficient documentation

## 2017-09-05 DIAGNOSIS — Z955 Presence of coronary angioplasty implant and graft: Secondary | ICD-10-CM | POA: Diagnosis not present

## 2017-09-05 DIAGNOSIS — Z87891 Personal history of nicotine dependence: Secondary | ICD-10-CM | POA: Diagnosis not present

## 2017-09-05 DIAGNOSIS — Z9889 Other specified postprocedural states: Secondary | ICD-10-CM | POA: Diagnosis not present

## 2017-09-05 DIAGNOSIS — Z981 Arthrodesis status: Secondary | ICD-10-CM | POA: Insufficient documentation

## 2017-09-05 DIAGNOSIS — Z951 Presence of aortocoronary bypass graft: Secondary | ICD-10-CM | POA: Diagnosis not present

## 2017-09-05 DIAGNOSIS — I10 Essential (primary) hypertension: Secondary | ICD-10-CM | POA: Diagnosis not present

## 2017-09-05 HISTORY — DX: Thoracic aortic aneurysm, without rupture, unspecified: I71.20

## 2017-09-05 HISTORY — DX: Thoracic aortic aneurysm, without rupture: I71.2

## 2017-09-05 HISTORY — DX: Malignant (primary) neoplasm, unspecified: C80.1

## 2017-09-05 LAB — CBC
HCT: 40.4 % (ref 39.0–52.0)
Hemoglobin: 12.9 g/dL — ABNORMAL LOW (ref 13.0–17.0)
MCH: 30.1 pg (ref 26.0–34.0)
MCHC: 31.9 g/dL (ref 30.0–36.0)
MCV: 94.2 fL (ref 78.0–100.0)
PLATELETS: 216 10*3/uL (ref 150–400)
RBC: 4.29 MIL/uL (ref 4.22–5.81)
RDW: 14 % (ref 11.5–15.5)
WBC: 7.4 10*3/uL (ref 4.0–10.5)

## 2017-09-05 LAB — BASIC METABOLIC PANEL
Anion gap: 12 (ref 5–15)
BUN: 21 mg/dL — AB (ref 6–20)
CALCIUM: 9.2 mg/dL (ref 8.9–10.3)
CHLORIDE: 101 mmol/L (ref 101–111)
CO2: 24 mmol/L (ref 22–32)
CREATININE: 1.1 mg/dL (ref 0.61–1.24)
GFR calc non Af Amer: >60 mL/min (ref >60)
Glucose, Bld: 113 mg/dL — ABNORMAL HIGH (ref 65–99)
Potassium: 4.8 mmol/L (ref 3.5–5.1)
SODIUM: 137 mmol/L (ref 135–145)

## 2017-09-05 NOTE — Pre-Procedure Instructions (Addendum)
Andrew [name]  09/05/2017      [address], [place] [address] Phone: [phone] Fax: [phone]    Your procedure is scheduled on September 11, 2017.  Report to [facility] Admitting at 730 AM.  Call this number if you have problems the morning of surgery:  [phone]   Remember:  Do not eat food or drink liquids after midnight.  Take these medicines the morning of surgery with A SIP OF WATER diltiazem (dilacor XR), triamcinolone (nasocort allergy nasal inhaler  Please drink 2 ensure pre-surgery drinks the night before surgery, and finish 1 ensure pre-surgery drink before leaving your house the morning of surgery. (Drinks given to patient at PAT appointment).  Beginning now, STOP taking any Aspirin (unless otherwise instructed by your surgeon), Aleve, Naproxen, Ibuprofen, Motrin, Advil, Goody's, BC's, all herbal medications, fish oil, and all vitamins  Continue all other medications as instructed by your physician except follow the above medication instructions before surgery   Do not wear jewelry.  Do not wear lotions, powders, or colognes, or deodorant.  Men may shave face and neck.  Do not bring valuables to the hospital.  [facility] is not responsible for any belongings or valuables.  Contacts, dentures or bridgework may not be worn into surgery.  Leave your suitcase in the car.  After surgery it may be brought to your room.  For patients admitted to the hospital, discharge time will be determined by your treatment team.  Patients discharged the day of surgery will not be allowed to drive home.   Special instructions:  [facility]- Preparing For Surgery  Before surgery, you can play an important role. Because skin is not sterile, your skin needs to be as free of germs as possible. You can reduce the number of germs on your skin by washing with CHG (chlorahexidine  gluconate) Soap before surgery.  CHG is an antiseptic cleaner which kills germs and bonds with the skin to continue killing germs even after washing.  Please do not use if you have an allergy to CHG or antibacterial soaps. If your skin becomes reddened/irritated stop using the CHG.  Do not shave (including legs and underarms) for at least 48 hours prior to first CHG shower. It is OK to shave your face.  Please follow these instructions carefully.   1. Shower the NIGHT BEFORE SURGERY and the MORNING OF SURGERY with CHG.   2. If you chose to wash your hair, wash your hair first as usual with your normal shampoo.  3. After you shampoo, rinse your hair and body thoroughly to remove the shampoo.  4. Use CHG as you would any other liquid soap. You can apply CHG directly to the skin and wash gently with a scrungie or a clean washcloth.   5. Apply the CHG Soap to your body ONLY FROM THE NECK DOWN.  Do not use on open wounds or open sores. Avoid contact with your eyes, ears, mouth and genitals (private parts). Wash Face and genitals (private parts)  with your normal soap.  6. Wash thoroughly, paying special attention to the area where your surgery will be performed.  7. Thoroughly rinse your body with warm water from the neck down.  8. DO NOT shower/wash with your normal soap after using and rinsing off the CHG Soap.  9. Pat yourself dry with a CLEAN TOWEL.  10. Wear CLEAN PAJAMAS  to bed the night before surgery, wear comfortable clothes the morning of surgery  11. Place CLEAN SHEETS on your bed the night of your first shower and DO NOT SLEEP WITH PETS.  Day of Surgery: Do not apply any deodorants/lotions. Please wear clean clothes to the hospital/surgery center.     Please read over the following fact sheets that you were given.

## 2017-09-05 NOTE — Progress Notes (Addendum)
PCP: [name], MD  Cardiologist: Dr. Tollie Eth  EKG: 06/2018 requested from Dr. Thurman Coyer office  Stress test:  5+ years ago per patient  ECHO: Pt unsure of date...requested all information from Dr. Thurman Coyer office  Cardiac Cath: 06/02/2009 done at [facility] tab-dated 06/02/2009  Chest x-ray: pt denies past year, no recent respiratory complications/infections  Patient had CABG in 2010 (documented under media tab in Epic) and has had previous cardiac studies...all have been done at Dr. Thurman Coyer office as he has been his only cardiologist per pt report.  I have requested all cardiac studies as patient is unsure of what he has or has not had done.

## 2017-09-06 ENCOUNTER — Encounter (HOSPITAL_COMMUNITY): Payer: Self-pay

## 2017-09-06 NOTE — Progress Notes (Addendum)
Anesthesia Chart Review: Patient is a 84 year old male scheduled for diagnostic laparoscopy, epigastric hernia repair on 09/11/17 by Dr. Rolm Bookbinder.   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     - PCP is Dr. [name]. - Cardiologist is Dr. Tollie Eth. Last visit 07/19/17 and was doing well from a cardiac standpoint at that time. Six month follow-up recommended. Plan for TAA follow-up in 09/2017. Abigail Butts at Dr. Cristal Generous office reported they have a note from Dr. Wynonia Lawman stating patient may proceed with surgery from a cardiac standpoint. She will fax note to PAT fax.   - Pulmonologist is Dr. Marshell Garfinkel. He was seen on 09/20/16 for abnormal CXR with bibasilar fibrosis. Chest CT showed no ILD, but changes of bronchiectasis and scarring which could be related to prior infection or asbestos exposure.   - [facility] Last visit 08/20/17.   Meds include ASA 81 mg, Lipitor, diltiazem, ferrous sulfate, Neurontin, Krill oil, Afrin nasal spray, Nasacort. PAT ASA instructions were unclear to me, so I contacted [facility] triage nurse Abigail Butts. She spoke with Dr. Donne Hazel who reported stated that patient did not have to stop ASA for surgery--I notified patient.  BP (!) 150/85   Pulse 62   Temp 36.6 C   Resp 20   Ht 5\' 9"  (1.753 m)   Wt 153 lb 8 oz (69.6 kg)   SpO2 97%   BMI 22.67 kg/m   EKG 07/19/17 (Dr. Wynonia Lawman): Sinus rhythm, nonspecific ST and T wave changes.  Last cardiac cath seen  was on 05/26/09 prior to CABG (3V CAD, preserved EF).  Last stress and echo were in 2010. According to [facility] records, "Echo 10/2008, Normal LV fxn with mild tricuspid regurg."  Chest CT 09/29/16: IMPRESSION: 1. Widespread but asymmetrically distributed areas of bronchiectasis and post infectious/inflammatory scarring, as above. 2. No other findings to suggest underlying interstitial lung disease. 3. Mild air trapping, indicative of small airways disease. There is also mild tracheobronchomalacia. 4. Calcified pleural plaques in the thorax bilaterally, compatible with asbestos related pleural disease. 5. Aortic atherosclerosis, in addition to left main and 3 vessel coronary artery disease. Status post median sternotomy for CABG, including LIMA to the LAD. 6. In addition, there is ectasia of the ascending thoracic aorta (4.3 cm in diameter) and mild aneurysmal dilatation of the descending thoracic aorta (4.5 cm in diameter). Recommend annual imaging followup by CTA or MRA. This recommendation follows 2010 ACCF/AHA/AATS/ACR/ASA/SCA/SCAI/SIR/STS/SVM Guidelines for the Diagnosis and Management of Patients with Thoracic Aortic Disease. Circulation. 2010; 121: N053-Z767. 7. There are calcifications of the aortic valve and mitral annulus. Echocardiographic correlation for evaluation of potential valvular dysfunction may be warranted if clinically indicated. (Dr. Vaughan Browner asked patient to follow up with PCP/cardiologist regarding CAD and dilated aorta.)   Preoperative labs noted. Cr 1.10. H/H 12.9/49.4, PLT 216. Glucose 113.  Patient with recent cardiac follow-up and has okay to proceed with surgery from a cardiac standpoint. If no acute changes then I would anticipate that he can proceed as planned.  George [facility] Stay [facility]/Anesthesiology Phone [phone] 09/07/2017 12:15 PM

## 2017-09-11 ENCOUNTER — Encounter (HOSPITAL_COMMUNITY)
Admission: RE | Disposition: A | Discharge status: Home / Self Care | Payer: Self-pay | Source: Ambulatory Visit | Attending: General Surgery

## 2017-09-11 ENCOUNTER — Observation Stay (HOSPITAL_COMMUNITY)
Admission: RE | Admit: 2017-09-11 | Discharge: 2017-09-12 | Disposition: A | Discharge status: Home / Self Care | Payer: Medicare Other | Source: Ambulatory Visit | Attending: General Surgery | Admitting: General Surgery

## 2017-09-11 ENCOUNTER — Other Ambulatory Visit: Payer: Self-pay

## 2017-09-11 ENCOUNTER — Ambulatory Visit (HOSPITAL_COMMUNITY): Payer: Medicare Other | Admitting: Anesthesiology

## 2017-09-11 ENCOUNTER — Encounter (HOSPITAL_COMMUNITY): Payer: Self-pay | Admitting: Certified Registered"

## 2017-09-11 ENCOUNTER — Ambulatory Visit (HOSPITAL_COMMUNITY): Payer: Medicare Other | Admitting: Vascular Surgery

## 2017-09-11 DIAGNOSIS — N189 Chronic kidney disease, unspecified: Secondary | ICD-10-CM | POA: Diagnosis not present

## 2017-09-11 DIAGNOSIS — N400 Enlarged prostate without lower urinary tract symptoms: Secondary | ICD-10-CM | POA: Insufficient documentation

## 2017-09-11 DIAGNOSIS — Z951 Presence of aortocoronary bypass graft: Secondary | ICD-10-CM | POA: Diagnosis not present

## 2017-09-11 DIAGNOSIS — I129 Hypertensive chronic kidney disease with stage 1 through stage 4 chronic kidney disease, or unspecified chronic kidney disease: Secondary | ICD-10-CM | POA: Diagnosis not present

## 2017-09-11 DIAGNOSIS — Z85828 Personal history of other malignant neoplasm of skin: Secondary | ICD-10-CM | POA: Diagnosis not present

## 2017-09-11 DIAGNOSIS — Z7982 Long term (current) use of aspirin: Secondary | ICD-10-CM | POA: Diagnosis not present

## 2017-09-11 DIAGNOSIS — Z79899 Other long term (current) drug therapy: Secondary | ICD-10-CM | POA: Diagnosis not present

## 2017-09-11 DIAGNOSIS — K439 Ventral hernia without obstruction or gangrene: Principal | ICD-10-CM | POA: Diagnosis present

## 2017-09-11 DIAGNOSIS — I2510 Atherosclerotic heart disease of native coronary artery without angina pectoris: Secondary | ICD-10-CM | POA: Diagnosis not present

## 2017-09-11 DIAGNOSIS — I712 Thoracic aortic aneurysm, without rupture: Secondary | ICD-10-CM | POA: Diagnosis not present

## 2017-09-11 DIAGNOSIS — Z87891 Personal history of nicotine dependence: Secondary | ICD-10-CM | POA: Insufficient documentation

## 2017-09-11 HISTORY — PX: INSERTION OF MESH: SHX5868

## 2017-09-11 HISTORY — PX: EPIGASTRIC HERNIA REPAIR: SHX404

## 2017-09-11 HISTORY — PX: LAPAROSCOPY: SHX197

## 2017-09-11 HISTORY — PX: HERNIA REPAIR: SHX51

## 2017-09-11 SURGERY — LAPAROSCOPY, DIAGNOSTIC
Anesthesia: General | Site: Chest

## 2017-09-11 MED ORDER — BUPIVACAINE HCL 0.25 % IJ SOLN
INTRAMUSCULAR | Status: DC | PRN
  Administered 2017-09-11: 6 mL

## 2017-09-11 MED ORDER — CEFAZOLIN SODIUM-DEXTROSE 2-4 GM/100ML-% IV SOLN
2.0000 g | INTRAVENOUS | Status: AC
  Administered 2017-09-11: 2 g via INTRAVENOUS

## 2017-09-11 MED ORDER — METOCLOPRAMIDE HCL 5 MG/ML IJ SOLN
INTRAMUSCULAR | Status: DC | PRN
  Administered 2017-09-11: 10 mg via INTRAVENOUS

## 2017-09-11 MED ORDER — TRIAMCINOLONE ACETONIDE 55 MCG/ACT NA AERO: 2.0000 | INHALATION_SPRAY | Freq: Every day | NASAL | Status: DC | PRN

## 2017-09-11 MED ORDER — ENOXAPARIN SODIUM 40 MG/0.4ML ~~LOC~~ SOLN
40.0000 mg | SUBCUTANEOUS | Status: DC
  Administered 2017-09-11: 40 mg via SUBCUTANEOUS
  Filled 2017-09-11: qty 0.4

## 2017-09-11 MED ORDER — ACETAMINOPHEN 650 MG RE SUPP: 650.0000 mg | Freq: Four times a day (QID) | RECTAL | Status: DC | PRN

## 2017-09-11 MED ORDER — SODIUM CHLORIDE 0.9 % IV SOLN
INTRAVENOUS | Status: DC
  Administered 2017-09-11: 17:00:00 via INTRAVENOUS

## 2017-09-11 MED ORDER — METHOCARBAMOL 500 MG PO TABS: 500.0000 mg | ORAL_TABLET | Freq: Four times a day (QID) | ORAL | Status: DC | PRN

## 2017-09-11 MED ORDER — LACTATED RINGERS IV SOLN
INTRAVENOUS | Status: DC
  Administered 2017-09-11: 50 mL/h via INTRAVENOUS
  Administered 2017-09-11: 11:00:00 via INTRAVENOUS

## 2017-09-11 MED ORDER — BUPIVACAINE-EPINEPHRINE 0.25% -1:200000 IJ SOLN
INTRAMUSCULAR | Status: AC
Start: 2017-09-11 — End: ?
  Filled 2017-09-11: qty 1

## 2017-09-11 MED ORDER — OXYCODONE HCL 5 MG PO TABS
5.0000 mg | ORAL_TABLET | Freq: Once | ORAL | Status: AC | PRN
  Administered 2017-09-11: 5 mg via ORAL

## 2017-09-11 MED ORDER — LIDOCAINE 2% (20 MG/ML) 5 ML SYRINGE
INTRAMUSCULAR | Status: AC
  Filled 2017-09-11: qty 5

## 2017-09-11 MED ORDER — SUGAMMADEX SODIUM 200 MG/2ML IV SOLN
INTRAVENOUS | Status: DC | PRN
  Administered 2017-09-11: 200 mg via INTRAVENOUS

## 2017-09-11 MED ORDER — GABAPENTIN 300 MG PO CAPS
ORAL_CAPSULE | ORAL | Status: AC
  Filled 2017-09-11: qty 1

## 2017-09-11 MED ORDER — ACETAMINOPHEN 500 MG PO TABS
ORAL_TABLET | ORAL | Status: AC
  Filled 2017-09-11: qty 2

## 2017-09-11 MED ORDER — ONDANSETRON 4 MG PO TBDP: 4.0000 mg | ORAL_TABLET | Freq: Four times a day (QID) | ORAL | Status: DC | PRN

## 2017-09-11 MED ORDER — GABAPENTIN 300 MG PO CAPS
300.0000 mg | ORAL_CAPSULE | Freq: Every day | ORAL | Status: DC
  Filled 2017-09-11: qty 1

## 2017-09-11 MED ORDER — ACETAMINOPHEN 325 MG PO TABS
650.0000 mg | ORAL_TABLET | Freq: Four times a day (QID) | ORAL | Status: DC | PRN
  Administered 2017-09-11 – 2017-09-12 (×2): 650 mg via ORAL
  Filled 2017-09-11 (×2): qty 2

## 2017-09-11 MED ORDER — LIDOCAINE HCL (CARDIAC) 20 MG/ML IV SOLN
INTRAVENOUS | Status: DC | PRN
  Administered 2017-09-11: 70 mg via INTRAVENOUS

## 2017-09-11 MED ORDER — FENTANYL CITRATE (PF) 250 MCG/5ML IJ SOLN
INTRAMUSCULAR | Status: AC
  Filled 2017-09-11: qty 5

## 2017-09-11 MED ORDER — HYDROMORPHONE HCL 1 MG/ML IJ SOLN
0.2500 mg | INTRAMUSCULAR | Status: DC | PRN
  Administered 2017-09-11 (×3): 0.25 mg via INTRAVENOUS

## 2017-09-11 MED ORDER — ENSURE PRE-SURGERY PO LIQD: 592.0000 mL | Freq: Once | ORAL | Status: DC

## 2017-09-11 MED ORDER — GABAPENTIN 300 MG PO CAPS
300.0000 mg | ORAL_CAPSULE | ORAL | Status: AC
  Administered 2017-09-11: 300 mg via ORAL

## 2017-09-11 MED ORDER — OXYCODONE HCL 5 MG PO TABS
ORAL_TABLET | ORAL | Status: AC
  Administered 2017-09-11: 5 mg via ORAL
  Filled 2017-09-11: qty 1

## 2017-09-11 MED ORDER — CEFAZOLIN SODIUM-DEXTROSE 2-4 GM/100ML-% IV SOLN
INTRAVENOUS | Status: AC
Start: 2017-09-11 — End: 2017-09-11
  Filled 2017-09-11: qty 100

## 2017-09-11 MED ORDER — OXYCODONE HCL 5 MG PO TABS: 5.0000 mg | ORAL_TABLET | ORAL | Status: DC | PRN

## 2017-09-11 MED ORDER — PROMETHAZINE HCL 25 MG/ML IJ SOLN: 6.2500 mg | INTRAMUSCULAR | Status: DC | PRN

## 2017-09-11 MED ORDER — FENTANYL CITRATE (PF) 100 MCG/2ML IJ SOLN
INTRAMUSCULAR | Status: DC | PRN
  Administered 2017-09-11: 50 ug via INTRAVENOUS

## 2017-09-11 MED ORDER — ACETAMINOPHEN 500 MG PO TABS
1000.0000 mg | ORAL_TABLET | ORAL | Status: AC
  Administered 2017-09-11: 1000 mg via ORAL

## 2017-09-11 MED ORDER — SIMETHICONE 80 MG PO CHEW: 40.0000 mg | CHEWABLE_TABLET | Freq: Four times a day (QID) | ORAL | Status: DC | PRN

## 2017-09-11 MED ORDER — ONDANSETRON HCL 4 MG/2ML IJ SOLN
INTRAMUSCULAR | Status: AC
  Filled 2017-09-11: qty 2

## 2017-09-11 MED ORDER — BUPIVACAINE HCL (PF) 0.25 % IJ SOLN
INTRAMUSCULAR | Status: AC
  Filled 2017-09-11: qty 30

## 2017-09-11 MED ORDER — 0.9 % SODIUM CHLORIDE (POUR BTL) OPTIME
TOPICAL | Status: DC | PRN
  Administered 2017-09-11: 1000 mL

## 2017-09-11 MED ORDER — ONDANSETRON HCL 4 MG/2ML IJ SOLN: 4.0000 mg | Freq: Four times a day (QID) | INTRAMUSCULAR | Status: DC | PRN

## 2017-09-11 MED ORDER — ONDANSETRON HCL 4 MG/2ML IJ SOLN
INTRAMUSCULAR | Status: DC | PRN
  Administered 2017-09-11: 4 mg via INTRAVENOUS

## 2017-09-11 MED ORDER — PHENYLEPHRINE HCL 10 MG/ML IJ SOLN
INTRAMUSCULAR | Status: DC | PRN
  Administered 2017-09-11: 120 ug via INTRAVENOUS

## 2017-09-11 MED ORDER — DILTIAZEM HCL ER COATED BEADS 180 MG PO CP24
180.0000 mg | ORAL_CAPSULE | Freq: Every day | ORAL | Status: DC
  Filled 2017-09-11: qty 1

## 2017-09-11 MED ORDER — ROCURONIUM BROMIDE 100 MG/10ML IV SOLN
INTRAVENOUS | Status: DC | PRN
  Administered 2017-09-11: 50 mg via INTRAVENOUS

## 2017-09-11 MED ORDER — HYDROMORPHONE HCL 1 MG/ML IJ SOLN
INTRAMUSCULAR | Status: AC
  Administered 2017-09-11: 0.25 mg via INTRAVENOUS
  Filled 2017-09-11: qty 1

## 2017-09-11 MED ORDER — EPHEDRINE 5 MG/ML INJ
INTRAVENOUS | Status: AC
  Filled 2017-09-11: qty 10

## 2017-09-11 MED ORDER — ASPIRIN EC 81 MG PO TBEC: 81.0000 mg | DELAYED_RELEASE_TABLET | Freq: Every day | ORAL | Status: DC

## 2017-09-11 MED ORDER — ROCURONIUM BROMIDE 10 MG/ML (PF) SYRINGE
PREFILLED_SYRINGE | INTRAVENOUS | Status: AC
  Filled 2017-09-11: qty 5

## 2017-09-11 MED ORDER — PHENYLEPHRINE 40 MCG/ML (10ML) SYRINGE FOR IV PUSH (FOR BLOOD PRESSURE SUPPORT)
PREFILLED_SYRINGE | INTRAVENOUS | Status: AC
Start: 2017-09-11 — End: ?
  Filled 2017-09-11: qty 10

## 2017-09-11 MED ORDER — OXYCODONE HCL 5 MG/5ML PO SOLN: 5.0000 mg | Freq: Once | ORAL | Status: AC | PRN

## 2017-09-11 MED ORDER — MORPHINE SULFATE (PF) 2 MG/ML IV SOLN: 1.0000 mg | INTRAVENOUS | Status: DC | PRN

## 2017-09-11 MED ORDER — PROPOFOL 10 MG/ML IV BOLUS
INTRAVENOUS | Status: DC | PRN
  Administered 2017-09-11: 120 mg via INTRAVENOUS

## 2017-09-11 MED ORDER — EPHEDRINE SULFATE 50 MG/ML IJ SOLN
INTRAMUSCULAR | Status: DC | PRN
  Administered 2017-09-11: 10 mg via INTRAVENOUS

## 2017-09-11 MED ORDER — MORPHINE SULFATE (PF) 4 MG/ML IV SOLN: 1.0000 mg | INTRAVENOUS | Status: DC | PRN

## 2017-09-11 MED ORDER — METOCLOPRAMIDE HCL 5 MG/ML IJ SOLN
INTRAMUSCULAR | Status: AC
  Filled 2017-09-11: qty 2

## 2017-09-11 SURGICAL SUPPLY — 64 items
APPLIER CLIP 5 13 M/L LIGAMAX5 (MISCELLANEOUS)
BLADE CLIPPER SURG (BLADE) IMPLANT
BLADE SURG 15 STRL LF DISP TIS (BLADE) ×1 IMPLANT
BLADE SURG 15 STRL SS (BLADE) ×2
CANISTER SUCT 3000ML PPV (MISCELLANEOUS) IMPLANT
CHLORAPREP W/TINT 26ML (MISCELLANEOUS) ×3 IMPLANT
CLIP APPLIE 5 13 M/L LIGAMAX5 (MISCELLANEOUS) IMPLANT
CLOSURE WOUND 1/2 X4 (GAUZE/BANDAGES/DRESSINGS) ×1
COVER SURGICAL LIGHT HANDLE (MISCELLANEOUS) ×3 IMPLANT
DECANTER SPIKE VIAL GLASS SM (MISCELLANEOUS) ×3 IMPLANT
DERMABOND ADVANCED (GAUZE/BANDAGES/DRESSINGS) ×2
DERMABOND ADVANCED .7 DNX12 (GAUZE/BANDAGES/DRESSINGS) ×1 IMPLANT
DEVICE RELIATACK FIXATION (MISCELLANEOUS) ×3 IMPLANT
DEVICE TROCAR PUNCTURE CLOSURE (ENDOMECHANICALS) ×3 IMPLANT
DRAPE LAPAROSCOPIC ABDOMINAL (DRAPES) ×3 IMPLANT
DRAPE LAPAROTOMY 100X72 PEDS (DRAPES) ×3 IMPLANT
DRAPE UTILITY XL STRL (DRAPES) ×3 IMPLANT
ELECT CAUTERY BLADE 6.4 (BLADE) ×3 IMPLANT
ELECT REM PT RETURN 9FT ADLT (ELECTROSURGICAL) ×3
ELECTRODE REM PT RTRN 9FT ADLT (ELECTROSURGICAL) ×1 IMPLANT
GAUZE SPONGE 4X4 16PLY XRAY LF (GAUZE/BANDAGES/DRESSINGS) ×3 IMPLANT
GLOVE BIO SURGEON STRL SZ7 (GLOVE) ×3 IMPLANT
GLOVE BIOGEL PI IND STRL 6.5 (GLOVE) ×1 IMPLANT
GLOVE BIOGEL PI IND STRL 7.0 (GLOVE) ×1 IMPLANT
GLOVE BIOGEL PI IND STRL 7.5 (GLOVE) ×1 IMPLANT
GLOVE BIOGEL PI IND STRL 8 (GLOVE) ×1 IMPLANT
GLOVE BIOGEL PI INDICATOR 6.5 (GLOVE) ×2
GLOVE BIOGEL PI INDICATOR 7.0 (GLOVE) ×2
GLOVE BIOGEL PI INDICATOR 7.5 (GLOVE) ×2
GLOVE BIOGEL PI INDICATOR 8 (GLOVE) ×2
GLOVE ECLIPSE 6.5 STRL STRAW (GLOVE) ×3 IMPLANT
GLOVE ECLIPSE 7.0 STRL STRAW (GLOVE) ×3 IMPLANT
GLOVE ECLIPSE 8.0 STRL XLNG CF (GLOVE) ×3 IMPLANT
GOWN STRL REUS W/ TWL LRG LVL3 (GOWN DISPOSABLE) ×3 IMPLANT
GOWN STRL REUS W/TWL LRG LVL3 (GOWN DISPOSABLE) ×6
KIT BASIN OR (CUSTOM PROCEDURE TRAY) ×3 IMPLANT
KIT ROOM TURNOVER OR (KITS) ×3 IMPLANT
MESH VENTRALEX ST 2.5 CRC MED (Mesh General) ×3 IMPLANT
NEEDLE HYPO 25GX1X1/2 BEV (NEEDLE) ×3 IMPLANT
NS IRRIG 1000ML POUR BTL (IV SOLUTION) ×3 IMPLANT
PACK SURGICAL SETUP 50X90 (CUSTOM PROCEDURE TRAY) ×3 IMPLANT
PAD ARMBOARD 7.5X6 YLW CONV (MISCELLANEOUS) ×3 IMPLANT
PENCIL BUTTON HOLSTER BLD 10FT (ELECTRODE) ×3 IMPLANT
SCISSORS LAP 5X35 DISP (ENDOMECHANICALS) IMPLANT
SET IRRIG TUBING LAPAROSCOPIC (IRRIGATION / IRRIGATOR) IMPLANT
SLEEVE ENDOPATH XCEL 5M (ENDOMECHANICALS) ×3 IMPLANT
STRIP CLOSURE SKIN 1/2X4 (GAUZE/BANDAGES/DRESSINGS) ×2 IMPLANT
SUT ETHIBOND NAB CT1 #1 30IN (SUTURE) ×6 IMPLANT
SUT MNCRL AB 4-0 PS2 18 (SUTURE) ×3 IMPLANT
SUT PROLENE 0 CT 1 CR/8 (SUTURE) IMPLANT
SUT VIC AB 2-0 CT1 27 (SUTURE) ×2
SUT VIC AB 2-0 CT1 TAPERPNT 27 (SUTURE) ×1 IMPLANT
SUT VIC AB 3-0 SH 27 (SUTURE) ×4
SUT VIC AB 3-0 SH 27X BRD (SUTURE) ×2 IMPLANT
SUT VICRYL AB 2 0 TIES (SUTURE) ×3 IMPLANT
SYR CONTROL 10ML LL (SYRINGE) ×3 IMPLANT
TOWEL OR 17X24 6PK STRL BLUE (TOWEL DISPOSABLE) ×3 IMPLANT
TOWEL OR 17X26 10 PK STRL BLUE (TOWEL DISPOSABLE) ×3 IMPLANT
TRAY FOLEY W/METER SILVER 14FR (SET/KITS/TRAYS/PACK) IMPLANT
TRAY LAPAROSCOPIC MC (CUSTOM PROCEDURE TRAY) ×3 IMPLANT
TROCAR XCEL BLUNT TIP 100MML (ENDOMECHANICALS) IMPLANT
TROCAR XCEL NON-BLD 11X100MML (ENDOMECHANICALS) IMPLANT
TROCAR XCEL NON-BLD 5MMX100MML (ENDOMECHANICALS) ×9 IMPLANT
TUBING INSUFFLATION (TUBING) ×3 IMPLANT

## 2017-09-11 NOTE — Interval H&P Note (Signed)
History and Physical Interval Note:  09/11/2017 9:07 AM  Andrew [name]  has presented today for surgery, with the diagnosis of Epigastric hernia  The various methods of treatment have been discussed with the patient and family. After consideration of risks, benefits and other options for treatment, the patient has consented to  Procedure(s) with comments: DIAGNOSTIC LAPAOSCOPY (N/A) - ERAS pathway INSERTION OF MESH (N/A) HERNIA REPAIR EPIGASTRIC ADULT (N/A) as a surgical intervention .  The patient's history has been reviewed, patient examined, no change in status, stable for surgery.  I have reviewed the patient's chart and labs.  Questions were answered to the patient's satisfaction.     Rolm Bookbinder

## 2017-09-11 NOTE — Anesthesia Preprocedure Evaluation (Signed)
Anesthesia Evaluation  Patient identified by MRN, date of birth, ID band Patient awake    Reviewed: Allergy & Precautions, H&P , NPO status , Patient's Chart, lab work & pertinent test results  History of Anesthesia Complications (+) history of anesthetic complications  Airway Mallampati: II  TM Distance: >3 FB Neck ROM: Full    Dental no notable dental hx. (+) Teeth Intact, Dental Advisory Given   Pulmonary neg pulmonary ROS, former smoker,    Pulmonary exam normal breath sounds clear to auscultation       Cardiovascular hypertension, On Medications + CAD   Rhythm:Regular Rate:Normal     Neuro/Psych negative neurological ROS  negative psych ROS   GI/Hepatic negative GI ROS, Neg liver ROS,   Endo/Other  negative endocrine ROS  Renal/GU Renal disease  negative genitourinary   Musculoskeletal   Abdominal   Peds  Hematology negative hematology ROS (+)   Anesthesia Other Findings   Reproductive/Obstetrics negative OB ROS                             Anesthesia Physical  Anesthesia Plan  ASA: III  Anesthesia Plan: General   Post-op Pain Management:    Induction: Intravenous  PONV Risk Score and Plan: 2 and Ondansetron and Midazolam  Airway Management Planned: Oral ETT  Additional Equipment:   Intra-op Plan:   Post-operative Plan: Extubation in OR  Informed Consent: I have reviewed the patients History and Physical, chart, labs and discussed the procedure including the risks, benefits and alternatives for the proposed anesthesia with the patient or authorized representative who has indicated his/her understanding and acceptance.   Dental advisory given  Plan Discussed with: CRNA  Anesthesia Plan Comments:         Anesthesia Quick Evaluation

## 2017-09-11 NOTE — Anesthesia Postprocedure Evaluation (Signed)
Anesthesia Post Note  Patient: KUPER RENNELS  Procedure(s) Performed: DIAGNOSTIC LAPAOSCOPY (N/A Chest) INSERTION OF MESH (N/A Chest) HERNIA REPAIR EPIGASTRIC ADULT (N/A Chest)     Patient location during evaluation: PACU Anesthesia Type: General Level of consciousness: awake and alert Pain management: pain level controlled Vital Signs Assessment: post-procedure vital signs reviewed and stable Respiratory status: spontaneous breathing, nonlabored ventilation and respiratory function stable Cardiovascular status: blood pressure returned to baseline and stable Postop Assessment: no apparent nausea or vomiting Anesthetic complications: no    Last Vitals:  Vitals:   09/11/17 1200 09/11/17 1215  BP: (!) 107/57 99/64  Pulse: 64 65  Resp: 11 14  Temp:    SpO2: 96% (!) 89%    Last Pain:  Vitals:   09/11/17 1215  TempSrc:   PainSc: Asleep                 Lynda Rainwater

## 2017-09-11 NOTE — Transfer of Care (Signed)
Immediate Anesthesia Transfer of Care Note  Patient: Andrew [name]  Procedure(s) Performed: DIAGNOSTIC LAPAOSCOPY (N/A Chest) INSERTION OF MESH (N/A Chest) HERNIA REPAIR EPIGASTRIC ADULT (N/A Chest)  Patient Location: PACU  Anesthesia Type:General  Level of Consciousness: awake, alert  and oriented  Airway & Oxygen Therapy: Patient Spontanous Breathing  Post-op Assessment: Report given to RN and Post -op Vital signs reviewed and stable  Post vital signs: Reviewed and stable  Last Vitals:  Vitals:   09/11/17 0742  BP: (!) 148/71  Pulse: 65  Resp: 20  Temp: 36.6 C  SpO2: 99%    Last Pain:  Vitals:   09/11/17 0742  TempSrc: Oral      Patients Stated Pain Goal: 3 (03/54/65 6812)  Complications: No apparent anesthesia complications

## 2017-09-11 NOTE — H&P (Signed)
Andrew [name] is an 84 y.o. male.   Chief Complaint: epigastric hernia HPI:  82 yom who I know from prior left inguinal hernia repair who presented with persistent epigastric hernia. this is at lowermost site of cab incision. this bothered him a month ago when he stated he was bloated. otherwise it really hasnt changed since I last saw him. it is not bothering him now. it always reduces even when standing. [place] is [place] and [place] [facility] [place] as well as working for [employer].  he had ct chest for lung evaluation and on these images he has epigastric hernia that just contains fat as expected at this location since last visit this is bothering him alot. it doesnt always reduce easily and preventing him from working out. he would like to consider repair now   Past Medical History:  Diagnosis Date  . Angioedema 06/01/2015  . Arthritis   . Back pain   . 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 Chronic kidney disease    ? bph, pt self catheterizes himself on schedule  . Complication of anesthesia    had to have Foley post op cabg and lumb lam  . Coronary artery disease   . Hypertension   . Thoracic aortic aneurysm ([place])    09/29/16 CT: 4.3 cm ascending, 4.5 cm descending. 1 yr f/u rec  . Wears glasses     Past Surgical History:  Procedure Laterality Date  . BACK SURGERY  2012   lumb lam  . CARDIAC CATHETERIZATION     2010-per patient  . COLONOSCOPY    . CORONARY ARTERY BYPASS GRAFT     2010  . HERNIA REPAIR     rih 1981  . INGUINAL HERNIA REPAIR  07/30/2012   Procedure: HERNIA REPAIR INGUINAL ADULT;  Surgeon: Rolm Bookbinder, MD;  Location: [place];  Service: General;  Laterality: Left;  Left Hernia Site  . INSERTION OF MESH  07/30/2012   Procedure: INSERTION OF MESH;  Surgeon: Rolm Bookbinder, MD;  Location: [place];  Service: General;  Laterality: Left;  Left Inguinal Hernia  . SPINE SURGERY      lumbar laminectomy  . TONSILLECTOMY      Family History  Problem Relation Age of Onset  . Allergic rhinitis Son   . Coronary artery disease Mother   . Angioedema Neg Hx   . Asthma Neg Hx   . Atopy Neg Hx   . Eczema Neg Hx   . Immunodeficiency Neg Hx   . Urticaria Neg Hx    Social History:  reports that he quit smoking about 33 years ago. He has a 8.00 pack-year smoking history. he has never used smokeless tobacco. He reports that he does not drink alcohol or use drugs.  Allergies: No Known Allergies  Medications Prior to Admission  Medication Sig Dispense Refill  . aspirin 81 MG tablet Take 81 mg by mouth daily.    Marland Kitchen atorvastatin (LIPITOR) 20 MG tablet Take 20 mg by mouth daily at 6 PM.     . Cholecalciferol (VITAMIN D3) 1000 units CAPS Take 1,000 Units by mouth daily.    Marland Kitchen Co-Enzyme Q-10 100 MG CAPS Take 100 mg by mouth daily.     Marland Kitchen diltiazem (DILACOR XR) 180 MG 24 hr capsule Take 180 mg by mouth daily.     . ferrous sulfate 325 (65 FE) MG tablet Take  325 mg by mouth daily with breakfast.    . gabapentin (NEURONTIN) 300 MG capsule Take 300 mg by mouth at bedtime.     Javier Docker Oil 500 MG CAPS Take 500 mg by mouth daily.    . Multiple Vitamins-Minerals (PRESERVISION AREDS 2 PO) Take 1 capsule by mouth 2 (two) times daily.    Marland Kitchen oxymetazoline (AFRIN) 0.05 % nasal spray Place 1 spray into both nostrils at bedtime as needed for congestion.    . sodium chloride (MURO 128) 5 % ophthalmic ointment Place 1 application into both eyes at bedtime.    . triamcinolone (NASACORT ALLERGY 24HR) 55 MCG/ACT AERO nasal inhaler Place 2 sprays into the nose daily as needed (for allergies).    . vitamin B-12 (CYANOCOBALAMIN) 1000 MCG tablet Take 1,000 mcg by mouth daily.     . vitamin C (ASCORBIC ACID) 250 MG tablet Take 250 mg by mouth daily.     . famotidine (PEPCID) 20 MG tablet Take 1 tablet (20 mg total) by mouth 2 (two) times daily. (Patient not taking: Reported on 08/29/2017) 10 tablet 0    No  results found for this or any previous visit (from the past 48 hour(s)). No results found.  Review of Systems  All other systems reviewed and are negative.   Blood pressure (!) 148/71, pulse 65, temperature 97.9 F (36.6 C), temperature source Oral, resp. rate 20, SpO2 99 %. Physical Exam  Weight: 154.8 lb Height: 69in Body Surface Area: 1.85 m Body Mass Index: 22.86 kg/m  Pulse: 82 (Regular)  BP: 116/62 (Sitting, Left Arm, Standard) Physical Exam Rolm Bookbinder MD; 08/17/2017 9:05 AM) General Mental Status-Alert. Chest and Lung Exam Chest and lung exam reveals -quiet, even and easy respiratory effort with no use of accessory muscles and on auscultation, normal breath sounds, no adventitious sounds and normal vocal resonance. Cardiovascular Cardiovascular examination reveals -normal heart sounds, regular rate and rhythm with no murmurs. Abdomen Note: soft nontender, reducible mildly tender epigastric hernia ? hernia about 1.5 cm in size Neurologic Neurologic evaluation reveals -alert and oriented x 3 with no impairment of recent or remote memory.  Assessment/Plan EPIGASTRIC HERNIA (K43.9) Story: dx laparoscopy likely open repair with mesh of epigastric hernia this is bothering him now and I think reasonable to repair. he just saw Dr Wynonia Lawman his cardiologist and I will ask him for risk stratification. I think should be fine. we discussed dx laparoscopy with likely open anterior repair with mesh. risks include bleeding, infection, recurrence, cardiac risks among other. we discussed recovery and anesthesia effects at his age as well.   Rolm Bookbinder, MD 09/11/2017, 9:06 AM

## 2017-09-11 NOTE — Anesthesia Procedure Notes (Signed)
Procedure Name: Intubation Date/Time: 09/11/2017 9:30 AM Performed by: Babs Bertin, CRNA Pre-anesthesia Checklist: Patient identified, Emergency Drugs available, Suction available and Patient being monitored Patient Re-evaluated:Patient Re-evaluated prior to induction Oxygen Delivery Method: Circle System Utilized Preoxygenation: Pre-oxygenation with 100% oxygen Induction Type: IV induction Ventilation: Mask ventilation without difficulty Laryngoscope Size: Mac and 3 Grade View: Grade I Tube type: Oral Tube size: 7.5 mm Number of attempts: 1 Airway Equipment and Method: Stylet and Oral airway Placement Confirmation: ETT inserted through vocal cords under direct vision,  positive ETCO2 and breath sounds checked- equal and bilateral Secured at: 23 cm Tube secured with: Tape Dental Injury: Teeth and Oropharynx as per pre-operative assessment

## 2017-09-11 NOTE — Brief Op Note (Signed)
09/11/2017  10:36 AM  PATIENT:  Andrew [name]  84 y.o. male  PRE-OPERATIVE DIAGNOSIS:  Epigastric hernia  POST-OPERATIVE DIAGNOSIS:  Epigastric hernia  PROCEDURE:  Procedure(s) with comments: DIAGNOSTIC LAPAOSCOPY (N/A) - ERAS pathway INSERTION OF MESH (N/A) HERNIA REPAIR EPIGASTRIC ADULT (N/A)  SURGEON:  Surgeon(s) and Role:    Rolm Bookbinder, MD - Primary  PHYSICIAN ASSISTANT:   ASSISTANTS: none   ANESTHESIA:   general  EBL:  20 mL   BLOOD ADMINISTERED:none  DRAINS: none   LOCAL MEDICATIONS USED:  MARCAINE     SPECIMEN:  No Specimen  DISPOSITION OF SPECIMEN:  N/A  COUNTS:  YES  TOURNIQUET:  * No tourniquets in log *  DICTATION: .Dragon Dictation  PLAN OF CARE: Admit for overnight observation  PATIENT DISPOSITION:  PACU - hemodynamically stable.   Delay start of Pharmacological VTE agent (>24hrs) due to surgical blood loss or risk of bleeding: no

## 2017-09-11 NOTE — Discharge Instructions (Signed)
[facility] LAPAROSCOPIC SURGERY: POST OP INSTRUCTIONS  Always review your discharge instruction sheet given to you by the facility where your surgery was performed. IF YOU HAVE DISABILITY OR FAMILY LEAVE FORMS, YOU MUST BRING THEM TO THE OFFICE FOR PROCESSING.   DO NOT GIVE THEM TO YOUR DOCTOR.  1. A prescription for pain medication may be given to you upon discharge.  Take your pain medication as prescribed, if needed.  If narcotic pain medicine is not needed, then you may take acetaminophen (Tylenol), naprosyn (Alleve), or ibuprofen (Advil) as needed. 2. Take your usually prescribed medications unless otherwise directed. 3. If you need a refill on your pain medication, please contact your pharmacy.  They will contact our office to request authorization. Prescriptions will not be filled after 5pm or on week-ends. 4. You should follow a light diet the first few days after arrival home, such as soup and crackers, etc.  Be sure to include lots of fluids daily. 5. Most patients will experience some swelling and bruising in the area of the incisions.  Ice packs will help.  Swelling and bruising can take several days to resolve.  6. It is common to experience some constipation if taking pain medication after surgery.  Increasing fluid intake and taking a stool softener (such as Colace) will usually help or prevent this problem from occurring.  A mild laxative (Milk of Magnesia or Miralax) should be taken according to package instructions if there are no bowel movements after 48 hours. 7. Unless discharge instructions indicate otherwise, you may remove your bandages 48 hours after surgery, and you may shower at that time.  You may have steri-strips (small skin tapes) in place directly over the incision.  These strips should be left on the skin for 7-10 days.  If your surgeon used skin glue on the incision, you may shower in 24 hours.  The glue will flake  off over the next 2-3 weeks.  Any sutures or staples will be removed at the office during your follow-up visit. 8. ACTIVITIES:  You may resume regular (light) daily activities beginning the next day--such as daily self-care, walking, climbing stairs--gradually increasing activities as tolerated.  You may have sexual intercourse when it is comfortable.  Refrain from any heavy lifting or straining until approved by your doctor. a. You may drive when you are no longer taking prescription pain medication, you can comfortably wear a seatbelt, and you can safely maneuver your car and apply brakes. b. RETURN TO WORK:  __________________________________________________________ 9. You should see your doctor in the office for a follow-up appointment approximately 2-3 weeks after your surgery.  Make sure that you call for this appointment within a day or two after you arrive home to insure a convenient appointment time. 10. OTHER INSTRUCTIONS: __________________________________________________________________________________________________________________________ __________________________________________________________________________________________________________________________ WHEN TO CALL YOUR DOCTOR: 1. Fever over 101.0 2. Inability to urinate 3. Continued bleeding from incision. 4. Increased pain, redness, or drainage from the incision. 5. Increasing abdominal pain  The clinic staff is available to answer your questions during regular business hours.  Please don't hesitate to call and ask to speak to one of the nurses for clinical concerns.  If you have a medical emergency, go to the nearest emergency room or call 911.  A surgeon from [facility] is always on call at the hospital. [address], [address], [place], [place]  [address] ? [address] [address], [place], [place]   [address] [phone] ? [phone] ? [phone]   [phone] Web site: [email or web address]  

## 2017-09-11 NOTE — Op Note (Signed)
Preoperative diagnosis: epigastric hernia Postoperative diagnosis: same as above Procedure:  Diagnostic laparoscopy Epigastric hernia repair with 6.4 cm ventralex mesh patch Surgeon: Dr Serita Grammes Anesthesia: general Ebl: minimal Specimens none Drains none Sponge and needle count correct dispo to pacu stable  Indications: This is a 48 yom with prior cab with symptomatic epigastric hernia. We discussed repair with mesh along with diagnostic laparoscopy.  Procedure: After informed consent obtained patient taken to the OR.  He was given antibiotics and had SCDs in place.  He was placed under general anesthesia without complication. He was prepped and draped in standard sterile surgical fashion. Timeout was performed  I made small incision in left upper quadrant after infiltrating marcaine. I dissected down to peritoneum and entered it sharply. I inserted a 5 mm trocar and insufflated the abdomen to 15 mm Hg pressure. I then placed two further 5 mm trocars in the left abdomen.  I took down the falciform ligament with cautery. He had a very small epigastric hernia that had preperitoneal fat in it.  I excised this fat and the hernia sac laparoscopically. The hernia was < 1 cm.  I then made transverse incision overlying it and elected to place a 6.4 cm ventralex patch. This was incisional so even though was small did not want to fix primarily.  The patch was in good position. I sutured this into place and closed fascia with #1 ethibond suture. I did place some tacks in laparoscopically to adhere to abdominal wall. This was all in good position. Hemostasis was observed. I closed the luq trocar site with 2 0 vicryl sutures using endoclose device.  I then removed trocars and desufflated the abdomen. I then closed the epigastric incision with 3-0 vicryl and 4-0 monocryl. The remainder were closed with 4-0 monocryl. Glue and steristrips were applied. He tolerated well, was extubated and transferred to  recovery stable.

## 2017-09-12 ENCOUNTER — Encounter (HOSPITAL_COMMUNITY): Payer: Self-pay | Admitting: General Surgery

## 2017-09-12 DIAGNOSIS — K439 Ventral hernia without obstruction or gangrene: Secondary | ICD-10-CM | POA: Diagnosis not present

## 2017-09-12 MED ORDER — DOCUSATE SODIUM 100 MG PO CAPS: 100.0000 mg | ORAL_CAPSULE | Freq: Two times a day (BID) | ORAL | 1 refills | Status: DC

## 2017-09-12 MED ORDER — DOCUSATE SODIUM 100 MG PO CAPS
100.0000 mg | ORAL_CAPSULE | Freq: Two times a day (BID) | ORAL | Status: DC
  Administered 2017-09-12: 100 mg via ORAL
  Filled 2017-09-12: qty 1

## 2017-09-12 MED ORDER — TRAMADOL HCL 50 MG PO TABS: 50.0000 mg | ORAL_TABLET | Freq: Four times a day (QID) | ORAL | 0 refills | Status: DC | PRN

## 2017-09-12 NOTE — Progress Notes (Signed)
0948 Patient discharged to home. Verbalizes understanding of all discharge instructions including incision care, discharge medications, and follow up MD visits. Patient accompanied by spouse.

## 2017-09-12 NOTE — Progress Notes (Signed)
1 Day Post-Op   Subjective/Chief Complaint: Doing fine, tol diet, voided, ambulating, pain controlled   Objective: Vital signs in last 24 hours: Temp:  [97.6 F (36.4 C)-99.7 F (37.6 C)] 98.4 F (36.9 C) (02/20 0728) Pulse Rate:  [39-87] 79 (02/20 0728) Resp:  [6-20] 18 (02/20 0451) BP: (98-138)/(57-85) 119/67 (02/20 0728) SpO2:  [89 %-99 %] 95 % (02/20 0728) Weight:  [69.6 kg (153 lb 7 oz)] 69.6 kg (153 lb 7 oz) (02/19 1600) Last BM Date: 09/11/17  Intake/Output from previous day: 02/19 0701 - 02/20 0700 In: 2141.7 [P.O.:420; I.V.:1721.7] Out: 20 [Blood:20] Intake/Output this shift: No intake/output data recorded.  GI: soft approp tender incisions clean with some ecchymosis   Anti-infectives: Anti-infectives (From admission, onward)   Start     Dose/Rate Route Frequency Ordered Stop   09/11/17 0728  ceFAZolin (ANCEF) 2-4 GM/100ML-% IVPB    Comments:  Leandrew Koyanagi   : cabinet override      09/11/17 0728 09/11/17 0935   09/11/17 0724  ceFAZolin (ANCEF) IVPB 2g/100 mL premix     2 g 200 mL/hr over 30 Minutes Intravenous On call to O.R. 09/11/17 0724 09/11/17 1005      Assessment/Plan: POD 1 epigastric hernia repair  Dc home if tolerates breakfast  Rolm Bookbinder 09/12/2017

## 2017-09-13 NOTE — Discharge Summary (Signed)
Physician Discharge Summary  Patient ID: Andrew [name] MRN: [id number] DOB/AGE: 82/06/1932 82 y.o.  Admit date: 09/11/2017 Discharge date: 09/13/2017  Admission Diagnoses: CAD Epigastric hernia  Discharge Diagnoses:  Active Problems:   Epigastric hernia   Discharged Condition: good  Hospital Course: 84 yom underwent diagnostic laparoscopy with epigastric hernia repair with mesh.  He did well overnight and was discharged home  Consults: None  Significant Diagnostic Studies: none  Treatments: surgery: dx lsc, epigastric hernia repair with mesh    Disposition: 01-Home or Self Care   Allergies as of 09/12/2017   No Known Allergies     Medication List    TAKE these medications   aspirin 81 MG tablet Take 81 mg by mouth daily.   atorvastatin 20 MG tablet Commonly known as:  LIPITOR Take 20 mg by mouth daily at 6 PM.   Co-Enzyme Q-10 100 MG Caps Take 100 mg by mouth daily.   diltiazem 180 MG 24 hr capsule Commonly known as:  DILACOR XR Take 180 mg by mouth daily.   docusate sodium 100 MG capsule Commonly known as:  COLACE Take 1 capsule (100 mg total) by mouth 2 (two) times daily.   famotidine 20 MG tablet Commonly known as:  PEPCID Take 1 tablet (20 mg total) by mouth 2 (two) times daily.   ferrous sulfate 325 (65 FE) MG tablet Take 325 mg by mouth daily with breakfast.   gabapentin 300 MG capsule Commonly known as:  NEURONTIN Take 300 mg by mouth at bedtime.   Krill Oil 500 MG Caps Take 500 mg by mouth daily.   NASACORT ALLERGY 24HR 55 MCG/ACT Aero nasal inhaler Generic drug:  triamcinolone Place 2 sprays into the nose daily as needed (for allergies).   oxymetazoline 0.05 % nasal spray Commonly known as:  AFRIN Place 1 spray into both nostrils at bedtime as needed for congestion.   PRESERVISION AREDS 2 PO Take 1 capsule by mouth 2 (two) times daily.   sodium chloride 5 % ophthalmic ointment Commonly known as:  MURO 782 Place 1 application  into both eyes at bedtime.   traMADol 50 MG tablet Commonly known as:  ULTRAM Take 1 tablet (50 mg total) by mouth every 6 (six) hours as needed.   vitamin B-12 1000 MCG tablet Commonly known as:  CYANOCOBALAMIN Take 1,000 mcg by mouth daily.   vitamin C 250 MG tablet Commonly known as:  ASCORBIC ACID Take 250 mg by mouth daily.   Vitamin D3 1000 units Caps Take 1,000 Units by mouth daily.      Follow-up Information    Rolm Bookbinder, MD Follow up in 3 week(s).   Specialty:  General Surgery Contact information: [place] [address] [phone]           Signed: Rolm Bookbinder 09/13/2017, 11:53 AM

## 2017-09-18 ENCOUNTER — Emergency Department (HOSPITAL_COMMUNITY)
Admission: EM | Admit: 2017-09-18 | Discharge: 2017-09-18 | Disposition: A | Discharge status: Home / Self Care | Payer: Medicare Other | Attending: Emergency Medicine | Admitting: Emergency Medicine

## 2017-09-18 ENCOUNTER — Encounter (HOSPITAL_COMMUNITY): Payer: Self-pay | Admitting: Emergency Medicine

## 2017-09-18 ENCOUNTER — Emergency Department (HOSPITAL_COMMUNITY): Payer: Medicare Other

## 2017-09-18 DIAGNOSIS — Z79899 Other long term (current) drug therapy: Secondary | ICD-10-CM | POA: Diagnosis not present

## 2017-09-18 DIAGNOSIS — N189 Chronic kidney disease, unspecified: Secondary | ICD-10-CM | POA: Diagnosis not present

## 2017-09-18 DIAGNOSIS — R1084 Generalized abdominal pain: Secondary | ICD-10-CM | POA: Diagnosis not present

## 2017-09-18 DIAGNOSIS — Z87891 Personal history of nicotine dependence: Secondary | ICD-10-CM | POA: Insufficient documentation

## 2017-09-18 DIAGNOSIS — T402X5A Adverse effect of other opioids, initial encounter: Secondary | ICD-10-CM | POA: Insufficient documentation

## 2017-09-18 DIAGNOSIS — K5903 Drug induced constipation: Secondary | ICD-10-CM | POA: Diagnosis not present

## 2017-09-18 DIAGNOSIS — I25810 Atherosclerosis of coronary artery bypass graft(s) without angina pectoris: Secondary | ICD-10-CM | POA: Insufficient documentation

## 2017-09-18 DIAGNOSIS — I129 Hypertensive chronic kidney disease with stage 1 through stage 4 chronic kidney disease, or unspecified chronic kidney disease: Secondary | ICD-10-CM | POA: Diagnosis not present

## 2017-09-18 DIAGNOSIS — K5900 Constipation, unspecified: Secondary | ICD-10-CM | POA: Diagnosis present

## 2017-09-18 LAB — COMPREHENSIVE METABOLIC PANEL
ALT: 8 U/L — AB (ref 17–63)
AST: 20 U/L (ref 15–41)
Albumin: 3.4 g/dL — ABNORMAL LOW (ref 3.5–5.0)
Alkaline Phosphatase: 91 U/L (ref 38–126)
Anion gap: 12 (ref 5–15)
BUN: 18 mg/dL (ref 6–20)
CHLORIDE: 97 mmol/L — AB (ref 101–111)
CO2: 27 mmol/L (ref 22–32)
CREATININE: 1.16 mg/dL (ref 0.61–1.24)
Calcium: 9.1 mg/dL (ref 8.9–10.3)
GFR calc Af Amer: >60 mL/min (ref >60)
GFR, EST NON AFRICAN AMERICAN: 56 mL/min — AB (ref >60)
Glucose, Bld: 109 mg/dL — ABNORMAL HIGH (ref 65–99)
Potassium: 4.6 mmol/L (ref 3.5–5.1)
Sodium: 136 mmol/L (ref 135–145)
Total Bilirubin: 0.9 mg/dL (ref 0.3–1.2)
Total Protein: 6.9 g/dL (ref 6.5–8.1)

## 2017-09-18 LAB — URINALYSIS, ROUTINE W REFLEX MICROSCOPIC
Bilirubin Urine: NEGATIVE
Glucose, UA: NEGATIVE mg/dL
Hgb urine dipstick: NEGATIVE
Ketones, ur: NEGATIVE mg/dL
Nitrite: NEGATIVE
Protein, ur: NEGATIVE mg/dL
Specific Gravity, Urine: 1.014 (ref 1.005–1.030)
Squamous Epithelial / LPF: NONE SEEN
pH: 7 (ref 5.0–8.0)

## 2017-09-18 LAB — CBC
HCT: 39.8 % (ref 39.0–52.0)
Hemoglobin: 13 g/dL (ref 13.0–17.0)
MCH: 30.2 pg (ref 26.0–34.0)
MCHC: 32.7 g/dL (ref 30.0–36.0)
MCV: 92.3 fL (ref 78.0–100.0)
PLATELETS: 232 10*3/uL (ref 150–400)
RBC: 4.31 MIL/uL (ref 4.22–5.81)
RDW: 13.7 % (ref 11.5–15.5)
WBC: 7.7 10*3/uL (ref 4.0–10.5)

## 2017-09-18 LAB — LIPASE, BLOOD: Lipase: 25 U/L (ref 11–51)

## 2017-09-18 LAB — I-STAT CG4 LACTIC ACID, ED: Lactic Acid, Venous: 0.73 mmol/L (ref 0.5–1.9)

## 2017-09-18 MED ORDER — IOPAMIDOL (ISOVUE-300) INJECTION 61%
INTRAVENOUS | Status: AC
  Administered 2017-09-18: 80 mL
  Filled 2017-09-18: qty 100

## 2017-09-18 MED ORDER — SODIUM CHLORIDE 0.9 % IV BOLUS (SEPSIS)
1000.0000 mL | Freq: Once | INTRAVENOUS | Status: AC
  Administered 2017-09-18: 1000 mL via INTRAVENOUS

## 2017-09-18 NOTE — ED Provider Notes (Signed)
[facility] EMERGENCY DEPARTMENT Provider Note   CSN: [id number] Arrival date & time: 09/18/17  1141     History   Chief Complaint Chief Complaint  Patient presents with  . Constipation    HPI Andrew [name] is a 84 y.o. male.  84 yo M with a chief complaint of constipation.  The patient has had issues having a bowel movement over the past week after he had had a epigastric hernia repair.  He has tried many therapies at home including MiraLAX, milk of magnesia, fleets enemas.  He has done some disimpaction at home as well.  He denies vomiting has had some abdominal wall soreness but denies overt abdominal pain.  Denies fevers.  He called his surgeon who suggested he come to the emergency department.  He was seen in quick look and a CT scan of the abdomen pelvis was ordered as well as labs.   The history is provided by the patient.  Constipation   This is a new problem. The current episode started less than 1 hour ago. Pertinent negatives include no abdominal pain.  Illness  This is a new problem. The current episode started yesterday. The problem occurs constantly. The problem has not changed since onset.Pertinent negatives include no chest pain, no abdominal pain, no headaches and no shortness of breath. Nothing aggravates the symptoms. Nothing relieves the symptoms. He has tried nothing for the symptoms. The treatment provided no relief.    Past Medical History:  Diagnosis Date  . Angioedema 06/01/2015  . Arthritis   . Back pain   . 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 Chronic kidney disease    ? bph, pt self catheterizes himself on schedule  . Complication of anesthesia    had to have Foley post op cabg and lumb lam  . Coronary artery disease   . Hypertension   . Thoracic aortic aneurysm ([place])    09/29/16 CT: 4.3 cm ascending, 4.5 cm descending. 1 yr f/u rec  . Wears glasses     Patient Active Problem List    Diagnosis Date Noted  . Epigastric hernia 09/11/2017  . Allergic reaction 06/01/2015  . Angioedema 06/01/2015    Past Surgical History:  Procedure Laterality Date  . BACK SURGERY  2012   lumb lam  . CARDIAC CATHETERIZATION     2010-per patient  . COLONOSCOPY    . CORONARY ARTERY BYPASS GRAFT     2010  . EPIGASTRIC HERNIA REPAIR N/A 09/11/2017   Procedure: HERNIA REPAIR EPIGASTRIC ADULT;  Surgeon: Rolm Bookbinder, MD;  Location: [facility];  Service: General;  Laterality: N/A;  . HERNIA REPAIR     rih 1981  . HERNIA REPAIR  09/11/2017   with mesh  . INGUINAL HERNIA REPAIR  07/30/2012   Procedure: HERNIA REPAIR INGUINAL ADULT;  Surgeon: Rolm Bookbinder, MD;  Location: [facility];  Service: General;  Laterality: Left;  Left Hernia Site  . INSERTION OF MESH  07/30/2012   Procedure: INSERTION OF MESH;  Surgeon: Rolm Bookbinder, MD;  Location: [facility];  Service: General;  Laterality: Left;  Left Inguinal Hernia  . INSERTION OF MESH N/A 09/11/2017   Procedure: INSERTION OF MESH;  Surgeon: Rolm Bookbinder, MD;  Location: [facility];  Service: General;  Laterality: N/A;  . LAPAROSCOPY N/A 09/11/2017   Procedure: DIAGNOSTIC LAPAOSCOPY;  Surgeon: Rolm Bookbinder, MD;  Location: [facility];  Service: General;  Laterality: N/A;  ERAS pathway  . SPINE SURGERY     lumbar laminectomy  . TONSILLECTOMY         Home Medications    Prior to Admission medications   Medication Sig Start Date End Date Taking? Authorizing Provider  aspirin 81 MG tablet Take 81 mg by mouth daily.    [provider]  atorvastatin (LIPITOR) 20 MG tablet Take 20 mg by mouth daily at 6 PM.  06/24/12   [provider]  Cholecalciferol (VITAMIN D3) 1000 units CAPS Take 1,000 Units by mouth daily.    [provider]  Co-Enzyme Q-10 100 MG CAPS Take 100 mg by mouth daily.     [provider]  diltiazem (DILACOR XR) 180 MG 24 hr capsule Take 180 mg by mouth  daily.  06/24/12   [provider]  docusate sodium (COLACE) 100 MG capsule Take 1 capsule (100 mg total) by mouth 2 (two) times daily. 09/12/17   Rolm Bookbinder, MD  famotidine (PEPCID) 20 MG tablet Take 1 tablet (20 mg total) by mouth 2 (two) times daily. Patient not taking: Reported on 08/29/2017 05/19/15   Dalia Heading, PA-C  ferrous sulfate 325 (65 FE) MG tablet Take 325 mg by mouth daily with breakfast.    [provider]  gabapentin (NEURONTIN) 300 MG capsule Take 300 mg by mouth at bedtime.  04/18/12   [provider]  Javier Docker Oil 500 MG CAPS Take 500 mg by mouth daily.    [provider]  Multiple Vitamins-Minerals (PRESERVISION AREDS 2 PO) Take 1 capsule by mouth 2 (two) times daily.    [provider]  oxymetazoline (AFRIN) 0.05 % nasal spray Place 1 spray into both nostrils at bedtime as needed for congestion.    [provider]  sodium chloride (MURO 128) 5 % ophthalmic ointment Place 1 application into both eyes at bedtime.    [provider]  traMADol (ULTRAM) 50 MG tablet Take 1 tablet (50 mg total) by mouth every 6 (six) hours as needed. 09/12/17   Rolm Bookbinder, MD  triamcinolone (NASACORT ALLERGY 24HR) 55 MCG/ACT AERO nasal inhaler Place 2 sprays into the nose daily as needed (for allergies).    [provider]  vitamin B-12 (CYANOCOBALAMIN) 1000 MCG tablet Take 1,000 mcg by mouth daily.     [provider]  vitamin C (ASCORBIC ACID) 250 MG tablet Take 250 mg by mouth daily.     [provider]    Family History Family History  Problem Relation Age of Onset  . Allergic rhinitis Son   . Coronary artery disease Mother   . Angioedema Neg Hx   . Asthma Neg Hx   . Atopy Neg Hx   . Eczema Neg Hx   . Immunodeficiency Neg Hx   . Urticaria Neg Hx     Social History Social History   Tobacco Use  . Smoking status: Former Smoker    Packs/day: 1.00    Years: 8.00    Pack years:  8.00    Last attempt to quit: 07/24/1984    Years since quitting: 33.1  . Smokeless tobacco: Never Used  Substance Use Topics  . Alcohol use: No  . Drug use: No     Allergies   Patient has no known allergies.   Review of Systems Review of Systems  Constitutional: Negative for chills and fever.  HENT: Negative for congestion and facial swelling.   Eyes: Negative for discharge and visual disturbance.  Respiratory: Negative for shortness of breath.   Cardiovascular: Negative for chest pain and palpitations.  Gastrointestinal: Positive for constipation. Negative for abdominal pain, diarrhea and vomiting.  Musculoskeletal: Negative for arthralgias and myalgias.  Skin: Negative for color change and rash.  Neurological: Negative for tremors, syncope and headaches.  Psychiatric/Behavioral: Negative for confusion and dysphoric mood.     Physical Exam Updated Vital Signs BP 135/83   Pulse 68   Temp (!) 97.5 F (36.4 C) (Oral)   Resp 19   Ht 5\' 9"  (1.753 m)   Wt 69.4 kg (153 lb)   SpO2 95%   BMI 22.59 kg/m   Physical Exam  Constitutional: He is oriented to person, place, and time. He appears well-developed and well-nourished.  HENT:  Head: Normocephalic and atraumatic.  Eyes: EOM are normal. Pupils are equal, round, and reactive to light.  Neck: Normal range of motion. Neck supple. No JVD present.  Cardiovascular: Normal rate and regular rhythm. Exam reveals no gallop and no friction rub.  No murmur heard. Pulmonary/Chest: No respiratory distress. He has no wheezes.  Abdominal: He exhibits no distension and no mass. There is tenderness (RUQ). There is no rebound and no guarding.  Genitourinary:  Genitourinary Comments: No hemorrhoids, some hard loose stool.  Clay consistency.  Most just out of the reach of my finger.  Musculoskeletal: Normal range of motion.  Neurological: He is alert and oriented to person, place, and time.  Skin: No rash noted. No pallor.  Psychiatric:  He has a normal mood and affect. His behavior is normal.  Nursing note and vitals reviewed.    ED Treatments / Results  Labs (all labs ordered are listed, but only abnormal results are displayed) Labs Reviewed  COMPREHENSIVE METABOLIC PANEL - Abnormal; Notable for the following components:      Result Value   Chloride 97 (*)    Glucose, Bld 109 (*)    Albumin 3.4 (*)    ALT 8 (*)    GFR calc non Af Amer 56 (*)    All other components within normal limits  URINALYSIS, ROUTINE W REFLEX MICROSCOPIC - Abnormal; Notable for the following components:   APPearance HAZY (*)    Leukocytes, UA LARGE (*)    Bacteria, UA RARE (*)    All other components within normal limits  CBC  LIPASE, BLOOD  I-STAT CG4 LACTIC ACID, ED    EKG  EKG Interpretation None       Radiology Ct Abdomen Pelvis W Contrast  Result Date: 09/18/2017 CLINICAL DATA:  84 year old male with history of hernia surgery 1 week ago. Unable to have a bowel movement. EXAM: CT ABDOMEN AND PELVIS WITH CONTRAST TECHNIQUE: Multidetector CT imaging of the abdomen and pelvis was performed using the standard protocol following bolus administration of intravenous contrast. CONTRAST:  78mL ISOVUE-300 IOPAMIDOL (ISOVUE-300) INJECTION 61% COMPARISON:  No priors. FINDINGS: Lower chest: The with right-sided pleural thickening and trace right pleural effusion. Peripheral scarring in the right middle and right lower lobes. Aortic atherosclerosis with ectasia of the distal descending thoracic aorta measuring up to 4.1 cm. Atherosclerotic calcifications in the left anterior descending, left circumflex and right coronary arteries. Median sternotomy wires. Hepatobiliary: Subcentimeter low-attenuation lesions in the right lobe of the liver are too small to definitively characterize, but are statistically likely tiny cysts. No other larger more suspicious appearing cystic or solid hepatic lesions. No intra or extrahepatic biliary ductal dilatation.  Gallbladder is normal in appearance. Pancreas: No pancreatic mass. No pancreatic  ductal dilatation. No pancreatic or peripancreatic fluid or inflammatory changes. Spleen: Unremarkable. Adrenals/Urinary Tract: Bilateral kidneys and bilateral adrenal glands are normal in appearance. No hydroureteronephrosis. Small amount of gas non dependently in the lumen of the urinary bladder. Urinary bladder is otherwise unremarkable in appearance. Stomach/Bowel: The appearance of the stomach is normal. No pathologic dilatation of small bowel or colon. Large volume of stool throughout the colon, suggesting underlying constipation. Adjacent to the proximal sigmoid is an area of inflammation associated with the mesocolic fat (best appreciated on axial image 60 of series 3 and coronal image 39 of series 6) which may represent an area of fat necrosis. The appendix is not confidently identified and may be surgically absent. Regardless, there are no inflammatory changes noted adjacent to the cecum to suggest the presence of an acute appendicitis at this time. Vascular/Lymphatic: Aortic atherosclerosis with fusiform aneurysmal dilatation of the abdominal aorta, largest in the infrarenal region where it measures up to 3.9 x 4.3 cm. No lymphadenopathy noted in the abdomen or pelvis. Reproductive: Prostate gland is diminutive or surgically absent. Other: No significant volume of ascites.  No pneumoperitoneum. Musculoskeletal: There are no aggressive appearing lytic or blastic lesions noted in the visualized portions of the skeleton. Status post PLIF at L4-L5. Median sternotomy wires. IMPRESSION: 1. Distention of the colon with large volume of stool suggesting constipation. 2. Inflammatory changes associated with the fat of the sigmoid mesocolon, concerning for focal fat necrosis, as above. 3. Small amount of gas non dependently in the lumen of the urinary bladder. This is presumably iatrogenic related to recent Foley catheter placement, but  correlation with urinalysis could be considered if there is any clinical concern for urinary tract infection with gas-forming organisms. 4. Aortic atherosclerosis, in addition to least 3 vessel coronary artery disease. In addition, there is aneurysmal dilatation of the abdominal aorta measuring up to 4.3 x 3.9 cm (mean diameter of 4.1 cm). Recommend followup by ultrasound in 1 year. This recommendation follows ACR consensus guidelines: White Paper of the ACR Incidental Findings Committee II on Vascular Findings. J Am Coll Radiol 2013; 10:789-794. 5. Right-sided pleural thickening and trace right pleural effusion with extensive scarring in the right lung base. 6. Additional incidental findings, as above. Aortic Atherosclerosis (ICD10-I70.0).  With the Electronically Signed   By: Vinnie Langton M.D.   On: 09/18/2017 19:13    Procedures Fecal disimpaction Date/Time: 09/18/2017 5:32 PM Performed by: Deno Etienne, DO Authorized by: Deno Etienne, DO  Consent: Verbal consent obtained. Consent given by: patient Patient understanding: patient states understanding of the procedure being performed Patient identity confirmed: verbally with patient Time out: Immediately prior to procedure a "time out" was called to verify the correct patient, procedure, equipment, support staff and site/side marked as required. Preparation: Patient was prepped and draped in the usual sterile fashion. Local anesthesia used: no  Anesthesia: Local anesthesia used: no  Sedation: Patient sedated: no  Patient tolerance: Patient tolerated the procedure well with no immediate complications Comments: Small clay consistency stool    (including critical care time)  Medications Ordered in ED Medications  sodium chloride 0.9 % bolus 1,000 mL (0 mLs Intravenous Stopped 09/18/17 2002)  iopamidol (ISOVUE-300) 61 % injection (80 mLs  Contrast Given 09/18/17 1755)     Initial Impression / Assessment and Plan / ED Course  I have  reviewed the triage vital signs and the nursing notes.  Pertinent labs & imaging results that were available during my care of the patient were reviewed  by me and considered in my medical decision making (see chart for details).     84 yo M with a chief complaint of constipation.  The patient on my exam does have some focal abdominal tenderness.  Therefore I think a CT is warranted.  We will give a bolus of IV fluids as he will get a contrasted study.  Did a rectal exam and the patient did have some loose stool in the vault and some just of the reach of my finger.  CT scan with no concerning pathology.  I discussed the cleanout protocol.  Discussed the case with Dr. Alvino Blood, general surgery.  We will have him follow-up as an outpatient.  Discharge home.  11:24 PM:  I have discussed the diagnosis/risks/treatment options with the patient and family and believe the pt to be eligible for discharge home to follow-up with PCP, Gen surgery. We also discussed returning to the ED immediately if new or worsening sx occur. We discussed the sx which are most concerning (e.g., sudden worsening pain, fever, inability to tolerate by mouth) that necessitate immediate return. Medications administered to the patient during their visit and any new prescriptions provided to the patient are listed below.  Medications given during this visit Medications  sodium chloride 0.9 % bolus 1,000 mL (0 mLs Intravenous Stopped 09/18/17 2002)  iopamidol (ISOVUE-300) 61 % injection (80 mLs  Contrast Given 09/18/17 1755)     The patient appears reasonably screen and/or stabilized for discharge and I doubt any other medical condition or other [facility] requiring further screening, evaluation, or treatment in the ED at this time prior to discharge.    Final Clinical Impressions(s) / ED Diagnoses   Final diagnoses:  Generalized abdominal pain  Constipation due to opioid therapy    ED Discharge Orders    None       Deno Etienne,  DO 09/18/17 2324

## 2017-09-18 NOTE — Discharge Instructions (Signed)
TAKE 8 CAPFULS OF MIRALAX IN A 32 OUNCE GATORADE AND DRINK THE WHOLE BEVERAGE.  Call your surgeon and let him know of other issues.  Also do a Fleets enema.  Try to hold this in your rectum as long as you can.

## 2017-09-18 NOTE — ED Notes (Signed)
This EMT attempted urine specimen collection. Pt stated he self-catheterizes. RN informed.

## 2017-09-18 NOTE — ED Notes (Signed)
ED provider at bedside.

## 2017-09-18 NOTE — ED Provider Notes (Signed)
Patient placed in Quick Look pathway, seen and evaluated   Chief Complaint: Abdominal pain, constipation  HPI:   Patient is 1 week s/p laproscopic hernia repair. Has not had a bowel movement in over 1 week. Has manually disimpacted himself 3 times. Has had enemas, milk of magnesia, and miralax without significant relief.  Notes constant aching pain which becomes sharp with bending.  No fevers, nausea, vomiting, or urinary symptoms.  ROS: +abd pain +constipation -nausea, - vomiting  Physical Exam:   Gen: No distress  Neuro: Awake and Alert  Skin: Warm    Focused Exam: 4 well-healing laparoscopic incisions, generalized tenderness to palpation with no focal tenderness.  Murphy's absent, Rovsing's absent.  No CVA tenderness.  Abdomen is distended.   Initiation of care has begun. The patient has been counseled on the process, plan, and necessity for staying for the completion/evaluation, and the remainder of the medical screening examination    Debroah Baller 09/18/17 1342    Carmin Muskrat, MD 09/18/17 [phone]

## 2017-09-18 NOTE — ED Triage Notes (Signed)
Pt had a Hernia repair 1 week ago and has not had a BM since. Pt has had 4 enemas, taken milk of mag, and Murelax. Pt abd is distended and Pt states it hurts so bad he cant sleep.  Pt has been drinking fluids and walking to assist with perestalsis but no relief.  Pt called surgeon and was told to come to the ED. Pt belly is tight and distended. A&Ox4. Pt took tramadol for pain since procedure. Bowel sounds heard Q4.

## 2017-09-18 NOTE — ED Notes (Signed)
Patient ambulatory to the room ? ?

## 2017-09-18 NOTE — ED Notes (Signed)
Attempted IV insertion x2.

## 2017-09-18 NOTE — ED Notes (Signed)
Patient Alert and oriented to baseline. Stable and ambulatory to baseline. Patient verbalized understanding of the discharge instructions.  Patient belongings were taken by the patient.   

## 2017-09-18 NOTE — ED Notes (Signed)
Patient able to have bowel movement while giving urine sample

## 2017-10-31 ENCOUNTER — Other Ambulatory Visit: Payer: Self-pay | Admitting: Cardiology

## 2017-10-31 DIAGNOSIS — I712 Thoracic aortic aneurysm, without rupture, unspecified: Secondary | ICD-10-CM

## 2017-11-07 ENCOUNTER — Ambulatory Visit
Admission: RE | Admit: 2017-11-07 | Discharge: 2017-11-07 | Disposition: A | Discharge status: Home / Self Care | Payer: Medicare Other | Source: Ambulatory Visit | Attending: Cardiology | Admitting: Cardiology

## 2017-11-07 DIAGNOSIS — I712 Thoracic aortic aneurysm, without rupture, unspecified: Secondary | ICD-10-CM

## 2017-11-26 ENCOUNTER — Encounter: Payer: Self-pay | Admitting: Gastroenterology

## 2017-12-24 DIAGNOSIS — Z961 Presence of intraocular lens: Secondary | ICD-10-CM | POA: Insufficient documentation

## 2017-12-24 DIAGNOSIS — Z9889 Other specified postprocedural states: Secondary | ICD-10-CM | POA: Insufficient documentation

## 2017-12-24 DIAGNOSIS — H18519 Endothelial corneal dystrophy, unspecified eye: Secondary | ICD-10-CM | POA: Insufficient documentation

## 2017-12-24 DIAGNOSIS — H353130 Nonexudative age-related macular degeneration, bilateral, stage unspecified: Secondary | ICD-10-CM | POA: Insufficient documentation

## 2018-07-12 ENCOUNTER — Encounter: Payer: Self-pay | Admitting: Internal Medicine

## 2018-07-12 ENCOUNTER — Ambulatory Visit: Payer: Medicare Other | Admitting: Internal Medicine

## 2018-07-12 VITALS — BP 132/70 | HR 67 | Ht 68.0 in | Wt 154.0 lb

## 2018-07-12 DIAGNOSIS — Z79899 Other long term (current) drug therapy: Secondary | ICD-10-CM

## 2018-07-12 DIAGNOSIS — I2510 Atherosclerotic heart disease of native coronary artery without angina pectoris: Secondary | ICD-10-CM | POA: Diagnosis not present

## 2018-07-12 DIAGNOSIS — Z951 Presence of aortocoronary bypass graft: Secondary | ICD-10-CM | POA: Diagnosis not present

## 2018-07-12 DIAGNOSIS — I712 Thoracic aortic aneurysm, without rupture, unspecified: Secondary | ICD-10-CM | POA: Insufficient documentation

## 2018-07-12 MED ORDER — DILTIAZEM HCL ER 180 MG PO CP24: 180.0000 mg | ORAL_CAPSULE | Freq: Every day | ORAL | 3 refills | Status: DC

## 2018-07-12 MED ORDER — ATORVASTATIN CALCIUM 20 MG PO TABS: 20.0000 mg | ORAL_TABLET | Freq: Every day | ORAL | 3 refills | Status: DC

## 2018-07-12 NOTE — Patient Instructions (Signed)
Medication Instructions:  Continue current medications If you need a refill on your cardiac medications before your next appointment, please call your pharmacy.   Testing/Procedures: Dr. Debara Pickett has ordered a CT angiogram chest/aorta to be completed April 2020. This will be done at [facility]. [address] You will need to have blood work Artist) done prior to this test to assess kidney function.   Follow-Up: At [facility], you and your health needs are our priority.  As part of our continuing mission to provide you with exceptional heart care, we have created designated Provider Care Teams.  These Care Teams include your primary Cardiologist (physician) and Advanced Practice Providers (APPs -  Physician Assistants and Nurse Practitioners) who all work together to provide you with the care you need, when you need it. You will need a follow up appointment in 6 months.  Please call our office 2 months in advance to schedule this appointment.  You may see Dr. Debara Pickett or one of the following Advanced Practice Providers on your designated Care Team: Almyra Deforest, [place] . Fabian Sharp, PA-C  Any Other Special Instructions Will Be Listed Below (If Applicable).

## 2018-07-12 NOTE — Progress Notes (Signed)
OFFICE NOTE  Chief Complaint:  Establish cardiologist  Primary Care Physician: [name], MD  HPI:  Andrew [name] is a 84 y.o. male with a past medial history significant for coronary artery disease status post CABG in 2010 with prior history of angioplasty and PCI dating back at least 10 years before that.  He also has history of thoracic aortic aneurysm with a 4.3 cm ascending and 4.5 cm descending aneurysm.  Chronic kidney disease, hypertension, arthritis, skin cancer, and ongoing back problem which she is having evaluated for injection.  There is a listed a history of paroxysmal atrial fibrillation however I cannot find evidence to support this.  He is worn a monitor, last in 2017 which showed PACs and PVCs.  There is no A. fib today.  He does have PVCs.  He is not anticoagulated beyond aspirin.  Testing from his PCP showed total cholesterol 150, HDL 53, LDL 83 and triglycerides 74.  PMHx:  Past Medical History:  Diagnosis Date  . Angioedema 06/01/2015  . Arthritis   . Back pain   . 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 Chronic kidney disease    ? bph, pt self catheterizes himself on schedule  . Complication of anesthesia    had to have Foley post op cabg and lumb lam  . Coronary artery disease   . Hypertension   . Thoracic aortic aneurysm ([place])    09/29/16 CT: 4.3 cm ascending, 4.5 cm descending. 1 yr f/u rec  . Wears glasses     Past Surgical History:  Procedure Laterality Date  . BACK SURGERY  2012   lumb lam  . CARDIAC CATHETERIZATION     2010-per patient  . COLONOSCOPY    . CORONARY ARTERY BYPASS GRAFT     2010  . EPIGASTRIC HERNIA REPAIR N/A 09/11/2017   Procedure: HERNIA REPAIR EPIGASTRIC ADULT;  Surgeon: Rolm Bookbinder, MD;  Location: [place];  Service: General;  Laterality: N/A;  . HERNIA REPAIR     rih 1981  . HERNIA REPAIR  09/11/2017   with mesh  . INGUINAL HERNIA REPAIR  07/30/2012   Procedure:  HERNIA REPAIR INGUINAL ADULT;  Surgeon: Rolm Bookbinder, MD;  Location: [place];  Service: General;  Laterality: Left;  Left Hernia Site  . INSERTION OF MESH  07/30/2012   Procedure: INSERTION OF MESH;  Surgeon: Rolm Bookbinder, MD;  Location: [place];  Service: General;  Laterality: Left;  Left Inguinal Hernia  . INSERTION OF MESH N/A 09/11/2017   Procedure: INSERTION OF MESH;  Surgeon: Rolm Bookbinder, MD;  Location: [place];  Service: General;  Laterality: N/A;  . LAPAROSCOPY N/A 09/11/2017   Procedure: DIAGNOSTIC LAPAOSCOPY;  Surgeon: Rolm Bookbinder, MD;  Location: [place];  Service: General;  Laterality: N/A;  ERAS pathway  . SPINE SURGERY     lumbar laminectomy  . TONSILLECTOMY      FAMHx:  Family History  Problem Relation Age of Onset  . Allergic rhinitis Son   . Coronary artery disease Mother   . Angioedema Neg Hx   . Asthma Neg Hx   . Atopy Neg Hx   . Eczema Neg Hx   . Immunodeficiency Neg Hx   . Urticaria Neg Hx     SOCHx:   reports that he quit smoking about 33 years ago. He has a 8.00 pack-year smoking history. He has never used smokeless tobacco. He  reports that he does not drink alcohol or use drugs.  ALLERGIES:  No Known Allergies  ROS: Pertinent items noted in HPI and remainder of comprehensive ROS otherwise negative.  HOME MEDS: Current Outpatient Medications on File Prior to Visit  Medication Sig Dispense Refill  . aspirin 81 MG tablet Take 81 mg by mouth daily.    Marland Kitchen atorvastatin (LIPITOR) 20 MG tablet Take 20 mg by mouth daily at 6 PM.     . Cholecalciferol (VITAMIN D3) 1000 units CAPS Take 1,000 Units by mouth daily.    Marland Kitchen Co-Enzyme Q-10 100 MG CAPS Take 100 mg by mouth daily.     Marland Kitchen diltiazem (DILACOR XR) 180 MG 24 hr capsule Take 180 mg by mouth daily.     . ferrous sulfate 325 (65 FE) MG tablet Take 325 mg by mouth daily with breakfast.    . gabapentin (NEURONTIN) 300 MG capsule Take 300 mg by mouth at bedtime.       Javier Docker Oil 500 MG CAPS Take 500 mg by mouth daily.    . Multiple Vitamins-Minerals (PRESERVISION AREDS 2 PO) Take 1 capsule by mouth 2 (two) times daily.    Marland Kitchen oxymetazoline (AFRIN) 0.05 % nasal spray Place 1 spray into both nostrils at bedtime as needed for congestion.    . sodium chloride (MURO 128) 5 % ophthalmic ointment Place 1 application into both eyes at bedtime.    . triamcinolone (NASACORT ALLERGY 24HR) 55 MCG/ACT AERO nasal inhaler Place 2 sprays into the nose daily as needed (for allergies).    . vitamin B-12 (CYANOCOBALAMIN) 1000 MCG tablet Take 1,000 mcg by mouth daily.     . vitamin C (ASCORBIC ACID) 250 MG tablet Take 250 mg by mouth daily.      No current facility-administered medications on file prior to visit.     LABS/IMAGING: No results found for this or any previous visit (from the past 48 hour(s)). No results found.  LIPID PANEL: No results found for: CHOL, TRIG, HDL, CHOLHDL, VLDL, LDLCALC, LDLDIRECT   WEIGHTS: Wt Readings from Last 3 Encounters:  07/12/18 154 lb (69.9 kg)  09/18/17 153 lb (69.4 kg)  09/11/17 153 lb 7 oz (69.6 kg)    VITALS: BP 132/70   Pulse 67   Ht 5\' 8"  (1.727 m)   Wt 154 lb (69.9 kg)   BMI 23.42 kg/m   EXAM: General appearance: alert and no distress Neck: no carotid bruit, no JVD and thyroid not enlarged, symmetric, no tenderness/mass/nodules Lungs: clear to auscultation bilaterally Heart: regular rate and rhythm, S1, S2 normal, no murmur, click, rub or gallop Abdomen: soft, non-tender; bowel sounds normal; no masses,  no organomegaly Extremities: extremities normal, atraumatic, no cyanosis or edema Pulses: 2+ and symmetric Skin: Skin color, texture, turgor normal. No rashes or lesions Neurologic: Grossly normal Psych: Pleasant  EKG: Sinus rhythm first-degree AV block and PVCs at 67- personally reviewed  ASSESSMENT: 1. Coronary artery disease status post CABG x4 with LIMA to LAD, free RIMA to RCA and left radial to OM  (04/2009) 2. Remote history of atrial fibrillation-not anticoagulated 3. Hyperlipidemia 4. History of second-degree AV block 5. Thoracic aortic aneurysm 6. Chronic venous insufficiency  PLAN: 1.   Mr. Alvidrez is doing well from a cardiac standpoint.  He denies any chest pain or worsening shortness of breath.  Cholesterol is been fairly well controlled.  He does have a thoracic aortic aneurysm which will need to be reassessed by CT in April 2020.  Blood pressure is well controlled today.  No changes were made to his medications.  We will plan follow-up in 6 months.  Pixie Casino, MD, [facility], [place] Director of the [facility]  Card[facility]ovascular [facility] Diplomate of the American Board of Clinical Lipidology Attending Cardiologist  Direct Dial: [phone]  Fax: [phone]  Website:  [email or web address] 07/12/2018, 2:33 PM

## 2018-09-05 ENCOUNTER — Encounter: Payer: Self-pay | Admitting: Family Medicine

## 2018-09-05 ENCOUNTER — Ambulatory Visit (INDEPENDENT_AMBULATORY_CARE_PROVIDER_SITE_OTHER): Payer: Medicare Other | Admitting: Family Medicine

## 2018-09-05 VITALS — BP 142/86 | Ht 67.5 in | Wt 146.6 lb

## 2018-09-05 DIAGNOSIS — M25552 Pain in left hip: Secondary | ICD-10-CM | POA: Diagnosis not present

## 2018-09-05 MED ORDER — NITROGLYCERIN 0.2 MG/HR TD PT24: MEDICATED_PATCH | TRANSDERMAL | 1 refills | Status: DC

## 2018-09-05 NOTE — Patient Instructions (Signed)
You have gluteus medius weakness/tendinopathy and external rotator tendinopathy. Start hip side raises and standing hip rotation exercises - work your way up to 3 sets of 10 once a day. Add ankle weight if these become too easy. Nitro patches 1/4th patch to affected area, change daily. Consider physical therapy, increased nitro dosage if not improving as expected. Follow up with me in 6 weeks.

## 2018-09-05 NOTE — Progress Notes (Signed)
  Subjective:     Patient ID: Andrew [name], male   DOB: 20-Aug-1931, 85 y.o.   MRN: [id number]  HPI Andrew [name] is coming in today for L buttock pain that he has had for 12 years. He describes the pain as burning or stinging in the gluteal area, that is worse with sitting for long periods. He describes radiation of the pain upwards to about the level of his SI joint. He also endorses occasional pain radiating down the lateral thigh and into the posterior knee as well as lateral ankle, all of which he has associated with itching in those areas and occasional redness of the skin. As of 12 months ago he reports worsening of his symptoms, and new similar symptoms in the R side. Now, he has a hard time walking because of instability and pain, due to a feeling that there is a "tight rubber band" in his buttock and upper leg. 2 weeks ago his pain was so significant that he could not walk, so he presented to a provider who gave him an IM injection that did improve his symptoms.  Over the 12 years of having these symptoms, he has been seen for the pain by multiple specialists, had 3 spine surgeries (2x on L4/5 and 1x on L1/2, all in 2015-2016), done PT, acupuncture, and had injections, none of which provided relief of his pain.   Review of Systems As above.    Objective:   Physical Exam  Spine: no soft tissue swelling, ecchymoses, or erythema; inspection notable for scarring c/w prior surgeries; full ROM but pain with full flexion, no pain on extension or spinal twisting; straight leg raise negative bilaterally  L hip: no soft tissue swelling, ecchymoses, or erythema; tenderness to palpation over SI joint and over the posterior aspect of the greater trochanter; internal rotation slightly limited otherwise full ROM without pain; hip abduction 4/5 with reproduction of pain, adduction flexion and extension 5/5; Negative FADIR, FABER painful but not over SI joint.  R hip: no soft tissue swelling, ecchymoses, or  erythema; no tenderness to palpation; full ROM without pain; hip abduction, adduction, and flexion intact; negative FADIR and FABER    Assessment:     Most likely tendinopathy of the L hip external rotators given exam findings of pain with FABER and weak hip abduction. As his pain has existed for a long time it is unlikely that he will ever be pain-free, but hopefully with strengthening his gluteus medius and nitro patches his symptoms can improve. If current therapies do not improve symptoms, can consider formal PT eval and treat.    Plan:     - Gave gluteus medius strengthening exercises - Nitro patches - Return to clinic in 6 weeks  Andrew [name], MS4

## 2018-09-08 ENCOUNTER — Encounter: Payer: Self-pay | Admitting: Family Medicine

## 2018-09-20 ENCOUNTER — Emergency Department (HOSPITAL_COMMUNITY): Payer: Medicare Other

## 2018-09-20 ENCOUNTER — Observation Stay (HOSPITAL_COMMUNITY)
Admission: EM | Admit: 2018-09-20 | Discharge: 2018-09-23 | Disposition: A | Discharge status: Home / Self Care | Payer: Medicare Other | Attending: Internal Medicine | Admitting: Internal Medicine

## 2018-09-20 ENCOUNTER — Encounter (HOSPITAL_COMMUNITY): Payer: Self-pay | Admitting: *Deleted

## 2018-09-20 ENCOUNTER — Other Ambulatory Visit: Payer: Self-pay

## 2018-09-20 DIAGNOSIS — R319 Hematuria, unspecified: Secondary | ICD-10-CM | POA: Insufficient documentation

## 2018-09-20 DIAGNOSIS — Z87891 Personal history of nicotine dependence: Secondary | ICD-10-CM | POA: Diagnosis not present

## 2018-09-20 DIAGNOSIS — N390 Urinary tract infection, site not specified: Secondary | ICD-10-CM | POA: Diagnosis not present

## 2018-09-20 DIAGNOSIS — I129 Hypertensive chronic kidney disease with stage 1 through stage 4 chronic kidney disease, or unspecified chronic kidney disease: Secondary | ICD-10-CM | POA: Diagnosis not present

## 2018-09-20 DIAGNOSIS — I712 Thoracic aortic aneurysm, without rupture, unspecified: Secondary | ICD-10-CM | POA: Diagnosis present

## 2018-09-20 DIAGNOSIS — Z7982 Long term (current) use of aspirin: Secondary | ICD-10-CM | POA: Insufficient documentation

## 2018-09-20 DIAGNOSIS — I2510 Atherosclerotic heart disease of native coronary artery without angina pectoris: Secondary | ICD-10-CM | POA: Diagnosis present

## 2018-09-20 DIAGNOSIS — Z8249 Family history of ischemic heart disease and other diseases of the circulatory system: Secondary | ICD-10-CM | POA: Diagnosis not present

## 2018-09-20 DIAGNOSIS — M6281 Muscle weakness (generalized): Secondary | ICD-10-CM | POA: Diagnosis not present

## 2018-09-20 DIAGNOSIS — Z951 Presence of aortocoronary bypass graft: Secondary | ICD-10-CM | POA: Diagnosis not present

## 2018-09-20 DIAGNOSIS — R509 Fever, unspecified: Secondary | ICD-10-CM | POA: Diagnosis present

## 2018-09-20 DIAGNOSIS — R262 Difficulty in walking, not elsewhere classified: Secondary | ICD-10-CM | POA: Diagnosis not present

## 2018-09-20 DIAGNOSIS — Z7951 Long term (current) use of inhaled steroids: Secondary | ICD-10-CM | POA: Diagnosis not present

## 2018-09-20 DIAGNOSIS — N189 Chronic kidney disease, unspecified: Secondary | ICD-10-CM | POA: Diagnosis not present

## 2018-09-20 DIAGNOSIS — I4510 Unspecified right bundle-branch block: Secondary | ICD-10-CM | POA: Insufficient documentation

## 2018-09-20 DIAGNOSIS — R339 Retention of urine, unspecified: Secondary | ICD-10-CM

## 2018-09-20 DIAGNOSIS — R531 Weakness: Secondary | ICD-10-CM

## 2018-09-20 DIAGNOSIS — Z79899 Other long term (current) drug therapy: Secondary | ICD-10-CM | POA: Diagnosis not present

## 2018-09-20 HISTORY — DX: Retention of urine, unspecified: R33.9

## 2018-09-20 LAB — URINALYSIS, ROUTINE W REFLEX MICROSCOPIC
BILIRUBIN URINE: NEGATIVE
Glucose, UA: NEGATIVE mg/dL
KETONES UR: NEGATIVE mg/dL
Nitrite: POSITIVE — AB
PROTEIN: NEGATIVE mg/dL
Specific Gravity, Urine: 1.014 (ref 1.005–1.030)
pH: 5 (ref 5.0–8.0)

## 2018-09-20 LAB — CBC WITH DIFFERENTIAL/PLATELET
Abs Immature Granulocytes: 0.04 10*3/uL (ref 0.00–0.07)
Basophils Absolute: 0 10*3/uL (ref 0.0–0.1)
Basophils Relative: 0 %
Eosinophils Absolute: 0.1 10*3/uL (ref 0.0–0.5)
Eosinophils Relative: 1 %
HCT: 42.4 % (ref 39.0–52.0)
Hemoglobin: 13.2 g/dL (ref 13.0–17.0)
IMMATURE GRANULOCYTES: 1 %
Lymphocytes Relative: 3 %
Lymphs Abs: 0.2 10*3/uL — ABNORMAL LOW (ref 0.7–4.0)
MCH: 29.4 pg (ref 26.0–34.0)
MCHC: 31.1 g/dL (ref 30.0–36.0)
MCV: 94.4 fL (ref 80.0–100.0)
Monocytes Absolute: 0.4 10*3/uL (ref 0.1–1.0)
Monocytes Relative: 6 %
NEUTROS PCT: 89 %
Neutro Abs: 6.8 10*3/uL (ref 1.7–7.7)
Platelets: 183 10*3/uL (ref 150–400)
RBC: 4.49 MIL/uL (ref 4.22–5.81)
RDW: 14.4 % (ref 11.5–15.5)
WBC: 7.6 10*3/uL (ref 4.0–10.5)
nRBC: 0 % (ref 0.0–0.2)

## 2018-09-20 LAB — COMPREHENSIVE METABOLIC PANEL
ALT: 14 U/L (ref 0–44)
AST: 26 U/L (ref 15–41)
Albumin: 3.4 g/dL — ABNORMAL LOW (ref 3.5–5.0)
Alkaline Phosphatase: 95 U/L (ref 38–126)
Anion gap: 8 (ref 5–15)
BUN: 17 mg/dL (ref 8–23)
CO2: 25 mmol/L (ref 22–32)
Calcium: 8.8 mg/dL — ABNORMAL LOW (ref 8.9–10.3)
Chloride: 103 mmol/L (ref 98–111)
Creatinine, Ser: 1.17 mg/dL (ref 0.61–1.24)
GFR calc Af Amer: >60 mL/min (ref >60)
GFR calc non Af Amer: 57 mL/min — ABNORMAL LOW (ref >60)
Glucose, Bld: 141 mg/dL — ABNORMAL HIGH (ref 70–99)
POTASSIUM: 3.8 mmol/L (ref 3.5–5.1)
SODIUM: 136 mmol/L (ref 135–145)
Total Bilirubin: 0.6 mg/dL (ref 0.3–1.2)
Total Protein: 6.7 g/dL (ref 6.5–8.1)

## 2018-09-20 LAB — CBG MONITORING, ED: Glucose-Capillary: 118 mg/dL — ABNORMAL HIGH (ref 70–99)

## 2018-09-20 LAB — LACTIC ACID, PLASMA: Lactic Acid, Venous: 0.9 mmol/L (ref 0.5–1.9)

## 2018-09-20 LAB — TROPONIN I: Troponin I: <0.03 ng/mL (ref <0.03)

## 2018-09-20 MED ORDER — SODIUM CHLORIDE 0.9 % IV BOLUS (SEPSIS)
1000.0000 mL | Freq: Once | INTRAVENOUS | Status: AC
  Administered 2018-09-20: 1000 mL via INTRAVENOUS

## 2018-09-20 MED ORDER — SODIUM CHLORIDE 0.9% FLUSH: 3.0000 mL | INTRAVENOUS | Status: DC | PRN

## 2018-09-20 MED ORDER — SODIUM CHLORIDE 0.9 % IV SOLN: 250.0000 mL | INTRAVENOUS | Status: DC | PRN

## 2018-09-20 MED ORDER — OXYMETAZOLINE HCL 0.05 % NA SOLN
1.0000 | Freq: Once | NASAL | Status: AC
  Administered 2018-09-20: 1 via NASAL
  Filled 2018-09-20: qty 30

## 2018-09-20 MED ORDER — ONDANSETRON HCL 4 MG PO TABS: 4.0000 mg | ORAL_TABLET | Freq: Four times a day (QID) | ORAL | Status: DC | PRN

## 2018-09-20 MED ORDER — ATORVASTATIN CALCIUM 10 MG PO TABS
20.0000 mg | ORAL_TABLET | Freq: Every day | ORAL | Status: DC
  Administered 2018-09-21 – 2018-09-22 (×2): 20 mg via ORAL
  Filled 2018-09-20 (×2): qty 2

## 2018-09-20 MED ORDER — SODIUM CHLORIDE 0.9% FLUSH
3.0000 mL | Freq: Two times a day (BID) | INTRAVENOUS | Status: DC
  Administered 2018-09-22: 3 mL via INTRAVENOUS

## 2018-09-20 MED ORDER — ONDANSETRON HCL 4 MG/2ML IJ SOLN: 4.0000 mg | Freq: Four times a day (QID) | INTRAMUSCULAR | Status: DC | PRN

## 2018-09-20 MED ORDER — SODIUM CHLORIDE (HYPERTONIC) 5 % OP SOLN
1.0000 [drp] | Freq: Every day | OPHTHALMIC | Status: DC
  Filled 2018-09-20: qty 15

## 2018-09-20 MED ORDER — ENOXAPARIN SODIUM 40 MG/0.4ML ~~LOC~~ SOLN
40.0000 mg | Freq: Every day | SUBCUTANEOUS | Status: DC
  Administered 2018-09-21 – 2018-09-23 (×3): 40 mg via SUBCUTANEOUS
  Filled 2018-09-20 (×4): qty 0.4

## 2018-09-20 MED ORDER — ACETAMINOPHEN 650 MG RE SUPP: 650.0000 mg | Freq: Four times a day (QID) | RECTAL | Status: DC | PRN

## 2018-09-20 MED ORDER — DILTIAZEM HCL ER COATED BEADS 180 MG PO CP24
180.0000 mg | ORAL_CAPSULE | Freq: Every day | ORAL | Status: DC
  Administered 2018-09-21 – 2018-09-23 (×3): 180 mg via ORAL
  Filled 2018-09-20 (×3): qty 1

## 2018-09-20 MED ORDER — ASPIRIN EC 81 MG PO TBEC
81.0000 mg | DELAYED_RELEASE_TABLET | Freq: Every day | ORAL | Status: DC
  Administered 2018-09-22: 81 mg via ORAL
  Filled 2018-09-20 (×4): qty 1

## 2018-09-20 MED ORDER — SODIUM CHLORIDE 0.9 % IV SOLN
1.0000 g | INTRAVENOUS | Status: DC
  Administered 2018-09-21 – 2018-09-22 (×2): 1 g via INTRAVENOUS
  Filled 2018-09-20 (×2): qty 10

## 2018-09-20 MED ORDER — HYDROCODONE-ACETAMINOPHEN 5-325 MG PO TABS
1.0000 | ORAL_TABLET | ORAL | Status: DC | PRN
  Administered 2018-09-20: 1 via ORAL
  Filled 2018-09-20: qty 1

## 2018-09-20 MED ORDER — POLYETHYLENE GLYCOL 3350 17 G PO PACK: 17.0000 g | PACK | Freq: Every day | ORAL | Status: DC | PRN

## 2018-09-20 MED ORDER — ACETAMINOPHEN 325 MG PO TABS: 650.0000 mg | ORAL_TABLET | Freq: Four times a day (QID) | ORAL | Status: DC | PRN

## 2018-09-20 MED ORDER — SODIUM CHLORIDE 0.9 % IV SOLN
1.0000 g | Freq: Once | INTRAVENOUS | Status: AC
  Administered 2018-09-20: 1 g via INTRAVENOUS
  Filled 2018-09-20: qty 10

## 2018-09-20 MED ORDER — GABAPENTIN 300 MG PO CAPS
300.0000 mg | ORAL_CAPSULE | Freq: Every day | ORAL | Status: DC
  Administered 2018-09-20 – 2018-09-22 (×3): 300 mg via ORAL
  Filled 2018-09-20 (×3): qty 1

## 2018-09-20 MED ORDER — ACETAMINOPHEN 325 MG PO TABS
650.0000 mg | ORAL_TABLET | Freq: Once | ORAL | Status: AC
  Administered 2018-09-20: 650 mg via ORAL
  Filled 2018-09-20: qty 2

## 2018-09-20 MED ORDER — SODIUM CHLORIDE 0.9 % IV BOLUS (SEPSIS)
250.0000 mL | Freq: Once | INTRAVENOUS | Status: AC
  Administered 2018-09-20: 250 mL via INTRAVENOUS

## 2018-09-20 NOTE — ED Provider Notes (Signed)
[facility] EMERGENCY DEPARTMENT Provider Note   CSN: [id number] Arrival date & time: 09/20/18  1810    History   Chief Complaint Chief Complaint  Patient presents with  . Weakness    HPI Andrew [name] is a 85 y.o. male.  He is brought in by EMS from the [place] primary care office for evaluation of weakness.  He said he woke up this morning feeling terrible and weak all over.  He says he chronically has body aches so there is no difference there.  He took a nap this afternoon and felt a little bit better but then the weakness returned and so his family brought him to the doctor's office where he was noted to be weak possibly with some pronator drift on the left.  Patient himself says he feels just generally weak all over.  He has to self cath due to urinary retention he said it has been dark but that is fairly normal for him.  He denies any cough vomiting diarrhea rashes or other infectious symptoms.  He has had some nasal congestion.     The history is provided by the patient.  Weakness  Severity:  Severe Onset quality:  Sudden Duration:  12 hours Timing:  Intermittent Progression:  Waxing and waning Chronicity:  New Context: not change in medication   Relieved by:  Nothing Worsened by:  Activity Ineffective treatments:  Lying down Associated symptoms: arthralgias, difficulty walking and myalgias   Associated symptoms: no abdominal pain, no chest pain, no cough, no diarrhea, no dysuria, no fever, no shortness of breath, no syncope and no vision change     Past Medical History:  Diagnosis Date  . Angioedema 06/01/2015  . Arthritis   . Back pain   . 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 Chronic kidney disease    ? bph, pt self catheterizes himself on schedule  . Complication of anesthesia    had to have Foley post op cabg and lumb lam  . Coronary artery disease   . Hypertension   . Thoracic aortic aneurysm  ([place])    09/29/16 CT: 4.3 cm ascending, 4.5 cm descending. 1 yr f/u rec  . Wears glasses     Patient Active Problem List   Diagnosis Date Noted  . Thoracic aortic aneurysm without rupture ([place]) 07/12/2018  . Epigastric hernia 09/11/2017  . Allergic reaction 06/01/2015  . Angioedema 06/01/2015    Past Surgical History:  Procedure Laterality Date  . BACK SURGERY  2012   lumb lam  . CARDIAC CATHETERIZATION     2010-per patient  . COLONOSCOPY    . CORONARY ARTERY BYPASS GRAFT     2010  . EPIGASTRIC HERNIA REPAIR N/A 09/11/2017   Procedure: HERNIA REPAIR EPIGASTRIC ADULT;  Surgeon: Rolm Bookbinder, MD;  Location: [place];  Service: General;  Laterality: N/A;  . HERNIA REPAIR     rih 1981  . HERNIA REPAIR  09/11/2017   with mesh  . INGUINAL HERNIA REPAIR  07/30/2012   Procedure: HERNIA REPAIR INGUINAL ADULT;  Surgeon: Rolm Bookbinder, MD;  Location: [place];  Service: General;  Laterality: Left;  Left Hernia Site  . INSERTION OF MESH  07/30/2012   Procedure: INSERTION OF MESH;  Surgeon: Rolm Bookbinder, MD;  Location: [place];  Service: General;  Laterality: Left;  Left Inguinal Hernia  . INSERTION OF MESH N/A 09/11/2017   Procedure:  INSERTION OF MESH;  Surgeon: Rolm Bookbinder, MD;  Location: [place];  Service: General;  Laterality: N/A;  . LAPAROSCOPY N/A 09/11/2017   Procedure: DIAGNOSTIC LAPAOSCOPY;  Surgeon: Rolm Bookbinder, MD;  Location: [place];  Service: General;  Laterality: N/A;  ERAS pathway  . SPINE SURGERY     lumbar laminectomy  . TONSILLECTOMY          Home Medications    Prior to Admission medications   Medication Sig Start Date End Date Taking? Authorizing Provider  aspirin 81 MG tablet Take 81 mg by mouth daily.    [provider]  atorvastatin (LIPITOR) 20 MG tablet Take 1 tablet (20 mg total) by mouth daily at 6 PM. 07/12/18   Hilty, Nadean Corwin, MD  Cholecalciferol (VITAMIN D3) 1000 units CAPS Take 1,000 Units  by mouth daily.    [provider]  Co-Enzyme Q-10 100 MG CAPS Take 100 mg by mouth daily.     [provider]  diltiazem (DILACOR XR) 180 MG 24 hr capsule Take 1 capsule (180 mg total) by mouth daily. 07/12/18   Hilty, Nadean Corwin, MD  ferrous sulfate 325 (65 FE) MG tablet Take 325 mg by mouth daily with breakfast.    [provider]  gabapentin (NEURONTIN) 300 MG capsule Take 300 mg by mouth at bedtime.  04/18/12   [provider]  Javier Docker Oil 500 MG CAPS Take 500 mg by mouth daily.    [provider]  Multiple Vitamins-Minerals (PRESERVISION AREDS 2 PO) Take 1 capsule by mouth 2 (two) times daily.    [provider]  nitroGLYCERIN (NITRODUR - DOSED IN MG/24 HR) 0.2 mg/hr patch Apply 1/4th patch to affected area, change daily 09/05/18   Hudnall, Sharyn Lull, MD  oxymetazoline (AFRIN) 0.05 % nasal spray Place 1 spray into both nostrils at bedtime as needed for congestion.    [provider]  sodium chloride (MURO 128) 5 % ophthalmic ointment Place 1 application into both eyes at bedtime.    [provider]  triamcinolone (NASACORT ALLERGY 24HR) 55 MCG/ACT AERO nasal inhaler Place 2 sprays into the nose daily as needed (for allergies).    [provider]  vitamin B-12 (CYANOCOBALAMIN) 1000 MCG tablet Take 1,000 mcg by mouth daily.     [provider]  vitamin C (ASCORBIC ACID) 250 MG tablet Take 250 mg by mouth daily.     [provider]    Family History Family History  Problem Relation Age of Onset  . Allergic rhinitis Son   . Coronary artery disease Mother   . Angioedema Neg Hx   . Asthma Neg Hx   . Atopy Neg Hx   . Eczema Neg Hx   . Immunodeficiency Neg Hx   . Urticaria Neg Hx     Social History Social History   Tobacco Use  . Smoking status: Former Smoker    Packs/day: 1.00    Years: 8.00    Pack years: 8.00    Last attempt to quit: 07/24/1984    Years since quitting: 34.1  . Smokeless  tobacco: Never Used  Substance Use Topics  . Alcohol use: No  . Drug use: No     Allergies   Patient has no known allergies.   Review of Systems Review of Systems  Constitutional: Negative for fever.  HENT: Negative for sore throat.   Eyes: Negative for visual disturbance.  Respiratory: Negative for cough and shortness of breath.   Cardiovascular: Negative  for chest pain and syncope.  Gastrointestinal: Negative for abdominal pain and diarrhea.  Genitourinary: Negative for dysuria.  Musculoskeletal: Positive for arthralgias and myalgias.  Skin: Negative for rash.  Neurological: Positive for weakness.     Physical Exam Updated Vital Signs BP 110/73 (BP Location: Left Arm)   Pulse 75   Temp 98.1 F (36.7 C) (Oral)   Resp 18   Ht 5\' 9"  (1.753 m)   Wt 66.5 kg   SpO2 93%   BMI 21.65 kg/m   Physical Exam Vitals signs and nursing note reviewed.  Constitutional:      General: He is not in acute distress.    Appearance: He is well-developed.  HENT:     Head: Normocephalic and atraumatic.  Eyes:     Conjunctiva/sclera: Conjunctivae normal.  Neck:     Musculoskeletal: Neck supple.  Cardiovascular:     Rate and Rhythm: Normal rate and regular rhythm.     Pulses: Normal pulses.     Heart sounds: No murmur.  Pulmonary:     Effort: Pulmonary effort is normal. No respiratory distress.     Breath sounds: Normal breath sounds.  Abdominal:     Palpations: Abdomen is soft.     Tenderness: There is no abdominal tenderness. There is no guarding or rebound.  Musculoskeletal: Normal range of motion.     Right lower leg: No edema.     Left lower leg: No edema.  Skin:    General: Skin is warm and dry.     Capillary Refill: Capillary refill takes less than 2 seconds.  Neurological:     Mental Status: He is alert and oriented to person, place, and time.     Cranial Nerves: No cranial nerve deficit.     Sensory: No sensory deficit.     Motor: No weakness.     Comments:  Patient's strength upper and lower extremities is symmetric and intact.  Proprioception intact and sensation to light touch intact.  No pronator drift to my exam.  Gait not tested secondary to his weakness generalized.      ED Treatments / Results  Labs (all labs ordered are listed, but only abnormal results are displayed) Labs Reviewed  COMPREHENSIVE METABOLIC PANEL - Abnormal; Notable for the following components:      Result Value   Glucose, Bld 141 (*)    Calcium 8.8 (*)    Albumin 3.4 (*)    GFR calc non Af Amer 57 (*)    All other components within normal limits  CBC WITH DIFFERENTIAL/PLATELET - Abnormal; Notable for the following components:   Lymphs Abs 0.2 (*)    All other components within normal limits  URINALYSIS, ROUTINE W REFLEX MICROSCOPIC - Abnormal; Notable for the following components:   Hgb urine dipstick SMALL (*)    Nitrite POSITIVE (*)    Leukocytes,Ua SMALL (*)    Bacteria, UA RARE (*)    All other components within normal limits  CBG MONITORING, ED - Abnormal; Notable for the following components:   Glucose-Capillary 118 (*)    All other components within normal limits  CULTURE, BLOOD (ROUTINE X 2)  CULTURE, BLOOD (ROUTINE X 2)  URINE CULTURE  LACTIC ACID, PLASMA  TROPONIN I  BASIC METABOLIC PANEL  CBC WITH DIFFERENTIAL/PLATELET  INFLUENZA PANEL BY PCR (TYPE A & B)    EKG EKG Interpretation  Date/Time:  Friday September 20 2018 18:19:29 EST Ventricular Rate:  91 PR Interval:    QRS Duration: 134  QT Interval:  390 QTC Calculation: 480 R Axis:   83 Text Interpretation:  Sinus rhythm Left atrial enlargement Right bundle branch block new rbbb compared with prior 1/12 Confirmed by Aletta Edouard [id number]) on 09/20/2018 6:46:02 PM   Radiology Dg Chest Port 1 View  Result Date: 09/20/2018 CLINICAL DATA:  Weakness EXAM: PORTABLE CHEST 1 VIEW COMPARISON:  03/18/2017 FINDINGS: Prior CABG. Heart is borderline in size. Dilatation of the descending  thoracic aorta as seen on prior chest x-ray and CT. Blunting of the right costophrenic angle is stable since prior study, likely scarring. Scarring at the right lung base. Left lung clear. No effusions or acute bony abnormality. IMPRESSION: Stable dilatation of the descending thoracic aorta. Mild cardiomegaly. Parenchymal and pleural thickening and scarring at the right lung base. Electronically Signed   By: Rolm Baptise M.D.   On: 09/20/2018 18:38    Procedures Procedures (including critical care time)  Medications Ordered in ED Medications  aspirin EC tablet 81 mg (has no administration in time range)  atorvastatin (LIPITOR) tablet 20 mg (has no administration in time range)  diltiazem (CARDIZEM CD) 24 hr capsule 180 mg (has no administration in time range)  gabapentin (NEURONTIN) capsule 300 mg (300 mg Oral Given 09/20/18 2329)  sodium chloride (MURO 128) 5 % ophthalmic solution 1 drop (has no administration in time range)  enoxaparin (LOVENOX) injection 40 mg (has no administration in time range)  sodium chloride flush (NS) 0.9 % injection 3 mL (3 mLs Intravenous Not Given 09/20/18 2313)  sodium chloride flush (NS) 0.9 % injection 3 mL (has no administration in time range)  0.9 %  sodium chloride infusion (has no administration in time range)  acetaminophen (TYLENOL) tablet 650 mg (has no administration in time range)    Or  acetaminophen (TYLENOL) suppository 650 mg (has no administration in time range)  HYDROcodone-acetaminophen (NORCO/VICODIN) 5-325 MG per tablet 1 tablet (1 tablet Oral Given 09/20/18 2247)  polyethylene glycol (MIRALAX / GLYCOLAX) packet 17 g (has no administration in time range)  ondansetron (ZOFRAN) tablet 4 mg (has no administration in time range)    Or  ondansetron (ZOFRAN) injection 4 mg (has no administration in time range)  cefTRIAXone (ROCEPHIN) 1 g in sodium chloride 0.9 % 100 mL IVPB (has no administration in time range)  sodium chloride 0.9 % bolus 1,000 mL  (0 mLs Intravenous Stopped 09/20/18 2125)    And  sodium chloride 0.9 % bolus 1,000 mL (0 mLs Intravenous Stopped 09/20/18 1942)    And  sodium chloride 0.9 % bolus 250 mL (0 mLs Intravenous Stopped 09/20/18 2029)  acetaminophen (TYLENOL) tablet 650 mg (650 mg Oral Given 09/20/18 1850)  cefTRIAXone (ROCEPHIN) 1 g in sodium chloride 0.9 % 100 mL IVPB (0 g Intravenous Stopped 09/20/18 2237)  oxymetazoline (AFRIN) 0.05 % nasal spray 1 spray (1 spray Each Nare Given 09/20/18 2238)     Initial Impression / Assessment and Plan / ED Course  I have reviewed the triage vital signs and the nursing notes.  Pertinent labs & imaging results that were available during my care of the patient were reviewed by me and considered in my medical decision making (see chart for details).       Patient here with increased lethargy and generalized weakness.  I have not identified any focal weakness on his neurologic exam.  His work-up was significant for a fever here and a UTI.  I have given him a gram of IV ceftriaxone and discussed with the  tried hospitalist Dr. Myna Hidalgo for admission.  Final Clinical Impressions(s) / ED Diagnoses   Final diagnoses:  Generalized weakness  Lower urinary tract infectious disease    ED Discharge Orders    None       Hayden Rasmussen, MD 09/20/18 2332

## 2018-09-20 NOTE — ED Triage Notes (Addendum)
Pt woke up this am feeling very weak.  Pt normally goes to the gym on a regular basis.  Pt needed help walking today and was sent here from Dr's office for weakness.  Rectal temp of 101.8.  Pt self caths.

## 2018-09-20 NOTE — H&P (Signed)
History and Physical    Andrew [name] EPP:[id number] DOB: 06-24-1932 DOA: 09/20/2018  PCP: [name], MD   Patient coming from: Home   Chief Complaint: Generalized weakness, malaise, fever/chills   HPI: Andrew [name] is a 85 y.o. male with medical history significant for patient coronary artery disease, descending thoracic aortic dilation, and chronic urinary retention managed with self caths, now presenting to the emergency department for generalized weakness, fatigue, and fever.  Patient reports that he woke with generalized weakness, fatigue, and general malaise.  He has had some sinus congestion, but no cough, shortness of breath, or sore throat.  He denies any abdominal pain, flank pain, vomiting, or diarrhea.  He denies any focal numbness or weakness, change in vision or hearing, or headache.  He slept most of the day.  Denies chest pain or palpitations.  ED Course: Upon arrival to the ED, patient is found to be febrile to 38.8 C, saturating 90s on room air, and with vitals otherwise normal.  EKG features a sinus rhythm with RBBB.  Chest x-ray is notable for stable dilation of the descending thoracic aorta, mild cardiomegaly, and parenchymal and pleural thickening and scarring at the right base.  Chemistry panel is unremarkable and CBC also unremarkable.  Lactic acid is reassuringly normal at 0.9.  Troponin is undetectable.  Urinalysis features small leukocytes and positive nitrites.  Blood and urine cultures were collected, 2.25 L normal saline was administered, and the patient was treated with empiric Rocephin.  He remains hemodynamically stable and will be observed for further evaluation and management.  Review of Systems:  All other systems reviewed and apart from HPI, are negative.  Past Medical History:  Diagnosis Date  . Angioedema 06/01/2015  . Arthritis   . Back pain   . 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 Chronic  kidney disease    ? bph, pt self catheterizes himself on schedule  . Complication of anesthesia    had to have Foley post op cabg and lumb lam  . Coronary artery disease   . Hypertension   . Thoracic aortic aneurysm ([place])    09/29/16 CT: 4.3 cm ascending, 4.5 cm descending. 1 yr f/u rec  . Urinary retention   . Wears glasses     Past Surgical History:  Procedure Laterality Date  . BACK SURGERY  2012   lumb lam  . CARDIAC CATHETERIZATION     2010-per patient  . COLONOSCOPY    . CORONARY ARTERY BYPASS GRAFT     2010  . EPIGASTRIC HERNIA REPAIR N/A 09/11/2017   Procedure: HERNIA REPAIR EPIGASTRIC ADULT;  Surgeon: Rolm Bookbinder, MD;  Location: [place];  Service: General;  Laterality: N/A;  . HERNIA REPAIR     rih 1981  . HERNIA REPAIR  09/11/2017   with mesh  . INGUINAL HERNIA REPAIR  07/30/2012   Procedure: HERNIA REPAIR INGUINAL ADULT;  Surgeon: Rolm Bookbinder, MD;  Location: [facility];  Service: General;  Laterality: Left;  Left Hernia Site  . INSERTION OF MESH  07/30/2012   Procedure: INSERTION OF MESH;  Surgeon: Rolm Bookbinder, MD;  Location: [place] [facility];  Service: General;  Laterality: Left;  Left Inguinal Hernia  . INSERTION OF MESH N/A 09/11/2017   Procedure: INSERTION OF MESH;  Surgeon: Rolm Bookbinder, MD;  Location: [place];  Service: General;  Laterality: N/A;  . LAPAROSCOPY N/A 09/11/2017   Procedure: DIAGNOSTIC  LAPAOSCOPY;  Surgeon: Rolm Bookbinder, MD;  Location: [place];  Service: General;  Laterality: N/A;  ERAS pathway  . SPINE SURGERY     lumbar laminectomy  . TONSILLECTOMY       reports that he quit smoking about 34 years ago. He has a 8.00 pack-year smoking history. He has never used smokeless tobacco. He reports that he does not drink alcohol or use drugs.  No Known Allergies  Family History  Problem Relation Age of Onset  . Allergic rhinitis Son   . Coronary artery disease Mother   . Angioedema Neg Hx   . Asthma Neg  Hx   . Atopy Neg Hx   . Eczema Neg Hx   . Immunodeficiency Neg Hx   . Urticaria Neg Hx      Prior to Admission medications   Medication Sig Start Date End Date Taking? Authorizing Provider  aspirin 81 MG tablet Take 81 mg by mouth daily.    [provider]  atorvastatin (LIPITOR) 20 MG tablet Take 1 tablet (20 mg total) by mouth daily at 6 PM. 07/12/18   Hilty, Nadean Corwin, MD  Cholecalciferol (VITAMIN D3) 1000 units CAPS Take 1,000 Units by mouth daily.    [provider]  Co-Enzyme Q-10 100 MG CAPS Take 100 mg by mouth daily.     [provider]  diltiazem (DILACOR XR) 180 MG 24 hr capsule Take 1 capsule (180 mg total) by mouth daily. 07/12/18   Hilty, Nadean Corwin, MD  ferrous sulfate 325 (65 FE) MG tablet Take 325 mg by mouth daily with breakfast.    [provider]  gabapentin (NEURONTIN) 300 MG capsule Take 300 mg by mouth at bedtime.  04/18/12   [provider]  Javier Docker Oil 500 MG CAPS Take 500 mg by mouth daily.    [provider]  Multiple Vitamins-Minerals (PRESERVISION AREDS 2 PO) Take 1 capsule by mouth 2 (two) times daily.    [provider]  nitroGLYCERIN (NITRODUR - DOSED IN MG/24 HR) 0.2 mg/hr patch Apply 1/4th patch to affected area, change daily 09/05/18   Hudnall, Sharyn Lull, MD  oxymetazoline (AFRIN) 0.05 % nasal spray Place 1 spray into both nostrils at bedtime as needed for congestion.    [provider]  sodium chloride (MURO 128) 5 % ophthalmic ointment Place 1 application into both eyes at bedtime.    [provider]  triamcinolone (NASACORT ALLERGY 24HR) 55 MCG/ACT AERO nasal inhaler Place 2 sprays into the nose daily as needed (for allergies).    [provider]  vitamin B-12 (CYANOCOBALAMIN) 1000 MCG tablet Take 1,000 mcg by mouth daily.     [provider]  vitamin C (ASCORBIC ACID) 250 MG tablet Take 250 mg by mouth daily.     [provider]    Physical  Exam: Vitals:   09/20/18 1900 09/20/18 1915 09/20/18 1930 09/20/18 1934  BP: (!) 146/83 131/79 (!) 138/93   Pulse: 86 79 84   Resp: 14 12 18    Temp:    98.7 F (37.1 C)  TempSrc:    Oral  SpO2: 93% 91% 92%   Weight:      Height:        Constitutional: NAD, calm  Eyes: PERTLA, lids and conjunctivae normal ENMT: Mucous membranes are dry. Posterior pharynx clear of any exudate or lesions.   Neck: normal, supple, no masses, no thyromegaly Respiratory: clear to auscultation bilaterally, no wheezing, no crackles. Normal respiratory effort.  Cardiovascular: S1 & S2 heard, regular rate and rhythm. Trace pretibial edema bilaterally. Abdomen: No distension, no tenderness, soft. Bowel sounds normal.  Musculoskeletal: no clubbing / cyanosis. No joint deformity upper and lower extremities.    Skin: no significant rashes, lesions, ulcers. Warm, dry, well-perfused. Neurologic: Gross hearing deficit, CN 2-12 grossly intact otherwise. Sensation intact, DTR normal. Generally weak, but achieves 5/5 strength throughout all extremities with encouragement.  Psychiatric: Alert and oriented to person, place, and situation. Calm, cooperative.    Labs on Admission: I have personally reviewed following labs and imaging studies  CBC: Recent Labs  Lab 09/20/18 1825  WBC 7.6  NEUTROABS 6.8  HGB 13.2  HCT 42.4  MCV 94.4  PLT 703   Basic Metabolic Panel: Recent Labs  Lab 09/20/18 1825  NA 136  K 3.8  CL 103  CO2 25  GLUCOSE 141*  BUN 17  CREATININE 1.17  CALCIUM 8.8*   GFR: Estimated Creatinine Clearance: 44.2 mL/min (by C-G formula based on SCr of 1.17 mg/dL). Liver Function Tests: Recent Labs  Lab 09/20/18 1825  AST 26  ALT 14  ALKPHOS 95  BILITOT 0.6  PROT 6.7  ALBUMIN 3.4*   No results for input(s): LIPASE, AMYLASE in the last 168 hours. No results for input(s): AMMONIA in the last 168 hours. Coagulation Profile: No results for input(s): INR, PROTIME in the last 168  hours. Cardiac Enzymes: Recent Labs  Lab 09/20/18 1917  TROPONINI <0.03   BNP (last 3 results) No results for input(s): PROBNP in the last 8760 hours. HbA1C: No results for input(s): HGBA1C in the last 72 hours. CBG: Recent Labs  Lab 09/20/18 1829  GLUCAP 118*   Lipid Profile: No results for input(s): CHOL, HDL, LDLCALC, TRIG, CHOLHDL, LDLDIRECT in the last 72 hours. Thyroid Function Tests: No results for input(s): TSH, T4TOTAL, FREET4, T3FREE, THYROIDAB in the last 72 hours. Anemia Panel: No results for input(s): VITAMINB12, FOLATE, FERRITIN, TIBC, IRON, RETICCTPCT in the last 72 hours. Urine analysis:    Component Value Date/Time   COLORURINE YELLOW 09/20/2018 2020   APPEARANCEUR CLEAR 09/20/2018 2020   LABSPEC 1.014 09/20/2018 2020   PHURINE 5.0 09/20/2018 2020   GLUCOSEU NEGATIVE 09/20/2018 2020   HGBUR SMALL (A) 09/20/2018 2020   BILIRUBINUR NEGATIVE 09/20/2018 2020   KETONESUR NEGATIVE 09/20/2018 2020   PROTEINUR NEGATIVE 09/20/2018 2020   UROBILINOGEN 0.2 06/09/2009 0918   NITRITE POSITIVE (A) 09/20/2018 2020   LEUKOCYTESUR SMALL (A) 09/20/2018 2020   Sepsis Labs: @LABRCNTIP (procalcitonin:4,lacticidven:4) )No results found for this or any previous visit (from the past 240 hour(s)).   Radiological Exams on Admission: Dg Chest Port 1 View  Result Date: 09/20/2018 CLINICAL DATA:  Weakness EXAM: PORTABLE CHEST 1 VIEW COMPARISON:  03/18/2017 FINDINGS: Prior CABG. Heart is borderline in size. Dilatation of the descending thoracic aorta as seen on prior chest x-ray and CT. Blunting of the right costophrenic angle is stable since prior study, likely scarring. Scarring at the right lung base. Left lung clear. No effusions or acute bony abnormality. IMPRESSION: Stable dilatation of the descending thoracic aorta. Mild cardiomegaly. Parenchymal and pleural thickening and scarring at the right lung base. Electronically Signed   By: Rolm Baptise M.D.   On: 09/20/2018 18:38     EKG: Independently reviewed. Sinus rhythm, RBBB.   Assessment/Plan   1. UTI; chronic urinary retention   - Presents with generalized weakness and fatigue, found to be febrile to 38.8 C without tachycardia, hypotension, leukocytosis, or elevated lactate   -  He self-caths for chronic urinary retention and UA is nitrite-positive  - Most likely this is UTI, though viral syndrome also considered  - Check influenza PCR, continue Rocephin, follow cultures and clinical course    2. CAD  - No anginal complaints  - Continue ASA and Lipitor     DVT prophylaxis: Lovenox  Code Status: Full  Family Communication: Discussed with patient   Consults called: None  Admission status: Observation     Vianne Bulls, MD [facility] Pager [phone]  If 7PM-7AM, please contact night-coverage [email or web address] Password [facility]  09/20/2018, 9:48 PM

## 2018-09-20 NOTE — Progress Notes (Signed)
Andrew [name] is a 85 y.o. male patient admitted from ED awake, alert - oriented  X 4 - no acute distress noted.  VSS - Blood pressure 110/73, pulse 75, temperature 98.1 F (36.7 C), temperature source Oral, resp. rate 18, height 5\' 9"  (1.753 m), weight 66.5 kg, SpO2 93 %.    IV in place, occlusive dsg intact without redness.     Will cont to eval and treat per MD orders.  Vidal Schwalbe, RN 09/20/2018 11:41 PM

## 2018-09-21 DIAGNOSIS — N390 Urinary tract infection, site not specified: Secondary | ICD-10-CM | POA: Diagnosis not present

## 2018-09-21 LAB — CBC WITH DIFFERENTIAL/PLATELET
BLASTS: 0 %
Band Neutrophils: 0 %
Basophils Absolute: 0 10*3/uL (ref 0.0–0.1)
Basophils Relative: 1 %
Eosinophils Absolute: 0 10*3/uL (ref 0.0–0.5)
Eosinophils Relative: 1 %
HCT: 35.7 % — ABNORMAL LOW (ref 39.0–52.0)
Hemoglobin: 11.2 g/dL — ABNORMAL LOW (ref 13.0–17.0)
Lymphocytes Relative: 12 %
Lymphs Abs: 0.5 10*3/uL — ABNORMAL LOW (ref 0.7–4.0)
MCH: 29.2 pg (ref 26.0–34.0)
MCHC: 31.4 g/dL (ref 30.0–36.0)
MCV: 93 fL (ref 80.0–100.0)
Metamyelocytes Relative: 0 %
Monocytes Absolute: 0.3 10*3/uL (ref 0.1–1.0)
Monocytes Relative: 7 %
Myelocytes: 0 %
NEUTROS PCT: 79 %
Neutro Abs: 3.6 10*3/uL (ref 1.7–7.7)
Other: 0 %
Platelets: 149 10*3/uL — ABNORMAL LOW (ref 150–400)
Promyelocytes Relative: 0 %
RBC: 3.84 MIL/uL — AB (ref 4.22–5.81)
RDW: 14.6 % (ref 11.5–15.5)
WBC: 4.4 10*3/uL (ref 4.0–10.5)
nRBC: 0 % (ref 0.0–0.2)
nRBC: 0 /100 WBC

## 2018-09-21 LAB — BASIC METABOLIC PANEL
Anion gap: 5 (ref 5–15)
BUN: 12 mg/dL (ref 8–23)
CO2: 28 mmol/L (ref 22–32)
Calcium: 8.2 mg/dL — ABNORMAL LOW (ref 8.9–10.3)
Chloride: 105 mmol/L (ref 98–111)
Creatinine, Ser: 0.98 mg/dL (ref 0.61–1.24)
GFR calc Af Amer: >60 mL/min (ref >60)
GFR calc non Af Amer: >60 mL/min (ref >60)
Glucose, Bld: 93 mg/dL (ref 70–99)
Potassium: 4.1 mmol/L (ref 3.5–5.1)
SODIUM: 138 mmol/L (ref 135–145)

## 2018-09-21 LAB — INFLUENZA PANEL BY PCR (TYPE A & B)
Influenza A By PCR: NEGATIVE
Influenza B By PCR: NEGATIVE

## 2018-09-21 NOTE — Progress Notes (Signed)
[facility] PROGRESS NOTE    Progress Note  Andrew [name]  ZOX:[id number] DOB: 01/23/1932 DOA: 09/20/2018 PCP: [name], MD     Brief Narrative:   Andrew [name] is an 85 y.o. 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  Assessment/Plan:   In the setting of self-catheterization Acute UTI/  Urinary retention: He is not septic. UA was positive for nitrates, he was started empirically on IV Rocephin blood cultures and urine cultures are pending. Influenza PCR is negative.  Coronary artery disease Asymptomatic continue aspirin and Lipitor.    DVT prophylaxis: heparin Family Communication:none Disposition Plan/Barrier to D/C: home in 24-48 hrs Code Status:     Code Status Orders  (From admission, onward)         Start     Ordered   09/20/18 2146  Full code  Continuous     09/20/18 2148        Code Status History    Date Active Date Inactive Code Status Order ID Comments User Context   09/11/2017 1540 09/12/2017 1300 Full Code [id number]  Rolm Bookbinder, MD Inpatient        IV Access:    Peripheral IV   Procedures and diagnostic studies:   Dg Chest Port 1 View  Result Date: 09/20/2018 CLINICAL DATA:  Weakness EXAM: PORTABLE CHEST 1 VIEW COMPARISON:  03/18/2017 FINDINGS: Prior CABG. Heart is borderline in size. Dilatation of the descending thoracic aorta as seen on prior chest x-ray and CT. Blunting of the right costophrenic angle is stable since prior study, likely scarring. Scarring at the right lung base. Left lung clear. No effusions or acute bony abnormality. IMPRESSION: Stable dilatation of the descending thoracic aorta. Mild cardiomegaly. Parenchymal and pleural thickening and scarring at the right lung base. Electronically Signed   By: Rolm Baptise M.D.   On: 09/20/2018  18:38     Medical Consultants:    None.  Anti-Infectives:   IV Rocephin  Subjective:    Andrew [name] feels weak and tired he is still anorexic.  Objective:    Vitals:   09/20/18 2230 09/20/18 2245 09/20/18 2318 09/21/18 0606  BP: 107/62 105/63 110/73 108/76  Pulse: 81 78 75 76  Resp: (!) 9 10 18 17   Temp:   98.1 F (36.7 C) 98.5 F (36.9 C)  TempSrc:   Oral Oral  SpO2: 90% 90% 93% 97%  Weight:      Height:        Intake/Output Summary (Last 24 hours) at 09/21/2018 0759 Last data filed at 09/21/2018 0556 Gross per 24 hour  Intake 2250 ml  Output 1000 ml  Net 1250 ml   Filed Weights   09/20/18 1822  Weight: 66.5 kg    Exam: General exam: In no acute distress. Respiratory system: Good air movement and clear to auscultation. Cardiovascular system: S1 & S2 heard, RRR.  Gastrointestinal system: Abdomen is nondistended, soft and nontender.  Central nervous system: Alert and oriented. No focal neurological deficits. Extremities: No pedal edema. Skin: No cuts or sores in feet or back Psychiatry: Judgement and insight appear normal. Mood & affect appropriate.    Data Reviewed:    Labs: Basic Metabolic Panel: Recent Labs  Lab 09/20/18 1825 09/21/18 0340  NA 136 138  K 3.8 4.1  CL 103 105  CO2 25 28  GLUCOSE 141*  93  BUN 17 12  CREATININE 1.17 0.98  CALCIUM 8.8* 8.2*   GFR Estimated Creatinine Clearance: 52.8 mL/min (by C-G formula based on SCr of 0.98 mg/dL). Liver Function Tests: Recent Labs  Lab 09/20/18 1825  AST 26  ALT 14  ALKPHOS 95  BILITOT 0.6  PROT 6.7  ALBUMIN 3.4*   No results for input(s): LIPASE, AMYLASE in the last 168 hours. No results for input(s): AMMONIA in the last 168 hours. Coagulation profile No results for input(s): INR, PROTIME in the last 168 hours.  CBC: Recent Labs  Lab 09/20/18 1825 09/21/18 0340  WBC 7.6 4.4  NEUTROABS 6.8 3.6  HGB 13.2 11.2*  HCT 42.4 35.7*  MCV 94.4 93.0  PLT 183 149*    Cardiac Enzymes: Recent Labs  Lab 09/20/18 1917  TROPONINI <0.03   BNP (last 3 results) No results for input(s): PROBNP in the last 8760 hours. CBG: Recent Labs  Lab 09/20/18 1829  GLUCAP 118*   D-Dimer: No results for input(s): DDIMER in the last 72 hours. Hgb A1c: No results for input(s): HGBA1C in the last 72 hours. Lipid Profile: No results for input(s): CHOL, HDL, LDLCALC, TRIG, CHOLHDL, LDLDIRECT in the last 72 hours. Thyroid function studies: No results for input(s): TSH, T4TOTAL, T3FREE, THYROIDAB in the last 72 hours.  Invalid input(s): FREET3 Anemia work up: No results for input(s): VITAMINB12, FOLATE, FERRITIN, TIBC, IRON, RETICCTPCT in the last 72 hours. Sepsis Labs: Recent Labs  Lab 09/20/18 1821 09/20/18 1825 09/21/18 0340  WBC  --  7.6 4.4  LATICACIDVEN 0.9  --   --    Microbiology No results found for this or any previous visit (from the past 240 hour(s)).   Medications:   . aspirin EC  81 mg Oral Daily  . atorvastatin  20 mg Oral q1800  . diltiazem  180 mg Oral Daily  . enoxaparin (LOVENOX) injection  40 mg Subcutaneous Daily  . gabapentin  300 mg Oral QHS  . sodium chloride  1 drop Both Eyes QHS  . sodium chloride flush  3 mL Intravenous Q12H   Continuous Infusions: . sodium chloride    . cefTRIAXone (ROCEPHIN)  IV        LOS: 0 days   Charlynne Cousins  [facility]  09/21/2018, 7:59 AM

## 2018-09-21 NOTE — Evaluation (Signed)
Physical Therapy Evaluation Patient Details Name: Andrew [name] MRN: [id number] DOB: 02-02-1932 Today's Date: 09/21/2018   History of Present Illness  85 y.o. male with medical history significant for patient coronary artery disease, descending thoracic aortic dilation, and chronic urinary retention managed with self caths. He presented to the emergency department for generalized weakness, fatigue, and fever. He was admitted with UTI.     Clinical Impression  Pt admitted with above diagnosis. Pt currently with functional limitations due to the deficits listed below (see PT Problem List). PTA pt lived at home with wife, active and independent. On eval, he required min guard assist transfers and ambulation 250 feet with RW. He does not need an AD at baseline. He has one step to enter his home. Pt will benefit from skilled PT to increase their independence and safety with mobility to allow discharge to the venue listed below.       Follow Up Recommendations Home health PT;Supervision for mobility/OOB    Equipment Recommendations  None recommended by PT    Recommendations for Other Services       Precautions / Restrictions Precautions Precautions: None Restrictions Weight Bearing Restrictions: No      Mobility  Bed Mobility Overal bed mobility: Modified Independent             General bed mobility comments: HOB elevated, +rail  Transfers Overall transfer level: Needs assistance Equipment used: Rolling walker (2 wheeled) Transfers: Sit to/from Stand Sit to Stand: Min guard         General transfer comment: min guard for safety, cues for hand placement  Ambulation/Gait Ambulation/Gait assistance: Min guard Gait Distance (Feet): 250 Feet Assistive device: Rolling walker (2 wheeled) Gait Pattern/deviations: Step-through pattern;Decreased stride length Gait velocity: decreased Gait velocity interpretation: 1.31 - 2.62 ft/sec, indicative of limited community  ambulator General Gait Details: Discussed attempting ambulation without RW. Pt reports he feels he needs the RW for stability at this time. No LOB noted. No c/o dizziness.  Stairs            Wheelchair Mobility    Modified Rankin (Stroke Patients Only)       Balance Overall balance assessment: Needs assistance Sitting-balance support: No upper extremity supported;Feet supported Sitting balance-Leahy Scale: Good     Standing balance support: Bilateral upper extremity supported;During functional activity Standing balance-Leahy Scale: Poor Standing balance comment: reliant on RW                             Pertinent Vitals/Pain Pain Assessment: No/denies pain    Home Living Family/patient expects to be discharged to:: Private residence Living Arrangements: Spouse/significant other Available Help at Discharge: Family;Available 24 hours/day Type of Home: (townhouse) Home Access: Stairs to enter   [employer] of Steps: 1 Home Layout: Able to live on main level with bedroom/bathroom Home Equipment: Walker - 2 wheels;Cane - single point;Shower seat - built in;Grab bars - tub/shower;Hand held shower head      Prior Function Level of Independence: Independent         Comments: [facility]     Hand Dominance        Extremity/Trunk Assessment   Upper Extremity Assessment Upper Extremity Assessment: Overall WFL for tasks assessed    Lower Extremity Assessment Lower Extremity Assessment: (strength is symmetrical)    Cervical / Trunk Assessment Cervical / Trunk Assessment: Kyphotic  Communication   Communication: HOH(hears best out  of R ear)  Cognition Arousal/Alertness: Awake/alert Behavior During Therapy: WFL for tasks assessed/performed Overall Cognitive Status: Within Functional Limits for tasks assessed                                        General Comments General comments (skin  integrity, edema, etc.): wife present and supportive    Exercises     Assessment/Plan    PT Assessment Patient needs continued PT services  PT Problem List Decreased strength;Decreased balance;Decreased mobility;Decreased activity tolerance       PT Treatment Interventions Functional mobility training;Balance training;Patient/family education;DME instruction;Gait training;Therapeutic activities;Stair training;Therapeutic exercise    PT Goals (Current goals can be found in the Care Plan section)  Acute Rehab PT Goals Patient Stated Goal: regain his strength PT Goal Formulation: With patient/family Time For Goal Achievement: 10/05/18 Potential to Achieve Goals: Good    Frequency Min 3X/week   Barriers to discharge        Co-evaluation               AM-PAC PT "6 Clicks" Mobility  Outcome Measure Help needed turning from your back to your side while in a flat bed without using bedrails?: None Help needed moving from lying on your back to sitting on the side of a flat bed without using bedrails?: A Little Help needed moving to and from a bed to a chair (including a wheelchair)?: A Little Help needed standing up from a chair using your arms (e.g., wheelchair or bedside chair)?: A Little Help needed to walk in hospital room?: A Little Help needed climbing 3-5 steps with a railing? : A Little 6 Click Score: 19    End of Session Equipment Utilized During Treatment: Gait belt Activity Tolerance: Patient tolerated treatment well Patient left: in chair;with call bell/phone within reach;with family/visitor present Nurse Communication: Mobility status PT Visit Diagnosis: Difficulty in walking, not elsewhere classified (R26.2);Muscle weakness (generalized) (M62.81)    Time: 1157-2620 PT Time Calculation (min) (ACUTE ONLY): 18 min   Charges:   PT Evaluation $PT Eval Low Complexity: 1 Low          Lorrin Goodell, PT  Office # [phone] Pager [phone]   Lorriane Shire 09/21/2018, 11:40 AM

## 2018-09-21 NOTE — Progress Notes (Signed)
Called to patient room due to bleeding coming from his penis, noted moderate amount of blood on the floor, patient stated he tried to get up and walk when he felt something and noted blood on the floor, patient denies any pain, will continue to monitor.

## 2018-09-22 DIAGNOSIS — N390 Urinary tract infection, site not specified: Secondary | ICD-10-CM | POA: Diagnosis not present

## 2018-09-22 MED ORDER — PRESERVISION AREDS 2 PO CAPS: ORAL_CAPSULE | Freq: Two times a day (BID) | ORAL | Status: DC

## 2018-09-22 MED ORDER — PROSIGHT PO TABS
1.0000 | ORAL_TABLET | Freq: Two times a day (BID) | ORAL | Status: DC
  Administered 2018-09-22 – 2018-09-23 (×2): 1 via ORAL
  Filled 2018-09-22 (×2): qty 1

## 2018-09-22 NOTE — Progress Notes (Signed)
[facility] PROGRESS NOTE    Progress Note  Andrew [name]  GYJ:[id number] DOB: 10/05/1931 DOA: 09/20/2018 PCP: [name], MD     Brief Narrative:   Andrew [name] is an 85 y.o. 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  Assessment/Plan:   In the setting of self-catheterization Acute UTI/  Urinary retention: He is not septic. Urine culture did grow more than 100,000 colonies of gram-negative, continue IV Rocephin blood cultures remain negative till date. Traumatic catheterization on 09/21/2018, this morning he relates he had minimal bleeding.  Coronary artery disease Asymptomatic continue aspirin and Lipitor.  Hematuria: Likely due to traumatic catheterization.  DVT prophylaxis: heparin Family Communication:none Disposition Plan/Barrier to D/C: home in 24 hrs Code Status:     Code Status Orders  (From admission, onward)         Start     Ordered   09/20/18 2146  Full code  Continuous     09/20/18 2148        Code Status History    Date Active Date Inactive Code Status Order ID Comments User Context   09/11/2017 1540 09/12/2017 1300 Full Code [id number]  Rolm Bookbinder, MD Inpatient        IV Access:    Peripheral IV   Procedures and diagnostic studies:   Dg Chest Port 1 View  Result Date: 09/20/2018 CLINICAL DATA:  Weakness EXAM: PORTABLE CHEST 1 VIEW COMPARISON:  03/18/2017 FINDINGS: Prior CABG. Heart is borderline in size. Dilatation of the descending thoracic aorta as seen on prior chest x-ray and CT. Blunting of the right costophrenic angle is stable since prior study, likely scarring. Scarring at the right lung base. Left lung clear. No effusions or acute bony abnormality. IMPRESSION: Stable dilatation of the descending thoracic aorta. Mild cardiomegaly. Parenchymal  and pleural thickening and scarring at the right lung base. Electronically Signed   By: Rolm Baptise M.D.   On: 09/20/2018 18:38     Medical Consultants:    None.  Anti-Infectives:   IV Rocephin  Subjective:    [name] relate he had a traumatic procedure and had some bleeding, this morning is much better, with only minimal eating with self-catheterization  Objective:    Vitals:   09/21/18 0606 09/21/18 1300 09/21/18 2108 09/22/18 0513  BP: 108/76 123/74 138/84 (!) 143/91  Pulse: 76 72 77 71  Resp: 17  16 16   Temp: 98.5 F (36.9 C) 98.3 F (36.8 C) 98.2 F (36.8 C) 98.4 F (36.9 C)  TempSrc: Oral Oral Oral Oral  SpO2: 97% 96% 95% 94%  Weight:      Height:        Intake/Output Summary (Last 24 hours) at 09/22/2018 0942 Last data filed at 09/22/2018 0940 Gross per 24 hour  Intake 340 ml  Output 3600 ml  Net -3260 ml   Filed Weights   09/20/18 1822  Weight: 66.5 kg    Exam: General exam: In no acute distress. Respiratory system: Good air movement and clear to auscultation. Cardiovascular system: S1 & S2 heard, RRR.  Gastrointestinal system: Abdomen is nondistended, soft and nontender.  Central nervous system: Alert and oriented. No focal neurological deficits. Extremities: No pedal edema. Skin: No cuts or sores in feet or back Psychiatry: Judgement and insight appear normal. Mood & affect appropriate.    Data Reviewed:  Labs: Basic Metabolic Panel: Recent Labs  Lab 09/20/18 1825 09/21/18 0340  NA 136 138  K 3.8 4.1  CL 103 105  CO2 25 28  GLUCOSE 141* 93  BUN 17 12  CREATININE 1.17 0.98  CALCIUM 8.8* 8.2*   GFR Estimated Creatinine Clearance: 52.8 mL/min (by C-G formula based on SCr of 0.98 mg/dL). Liver Function Tests: Recent Labs  Lab 09/20/18 1825  AST 26  ALT 14  ALKPHOS 95  BILITOT 0.6  PROT 6.7  ALBUMIN 3.4*   No results for input(s): LIPASE, AMYLASE in the last 168 hours. No results for input(s): AMMONIA in the last  168 hours. Coagulation profile No results for input(s): INR, PROTIME in the last 168 hours.  CBC: Recent Labs  Lab 09/20/18 1825 09/21/18 0340  WBC 7.6 4.4  NEUTROABS 6.8 3.6  HGB 13.2 11.2*  HCT 42.4 35.7*  MCV 94.4 93.0  PLT 183 149*   Cardiac Enzymes: Recent Labs  Lab 09/20/18 1917  TROPONINI <0.03   BNP (last 3 results) No results for input(s): PROBNP in the last 8760 hours. CBG: Recent Labs  Lab 09/20/18 1829  GLUCAP 118*   D-Dimer: No results for input(s): DDIMER in the last 72 hours. Hgb A1c: No results for input(s): HGBA1C in the last 72 hours. Lipid Profile: No results for input(s): CHOL, HDL, LDLCALC, TRIG, CHOLHDL, LDLDIRECT in the last 72 hours. Thyroid function studies: No results for input(s): TSH, T4TOTAL, T3FREE, THYROIDAB in the last 72 hours.  Invalid input(s): FREET3 Anemia work up: No results for input(s): VITAMINB12, FOLATE, FERRITIN, TIBC, IRON, RETICCTPCT in the last 72 hours. Sepsis Labs: Recent Labs  Lab 09/20/18 1821 09/20/18 1825 09/21/18 0340  WBC  --  7.6 4.4  LATICACIDVEN 0.9  --   --    Microbiology Recent Results (from the past 240 hour(s))  Blood Culture (routine x 2)     Status: None (Preliminary result)   Collection Time: 09/20/18  6:26 PM  Result Value Ref Range Status   Specimen Description BLOOD LEFT ANTECUBITAL  Final   Special Requests   Final    BOTTLES DRAWN AEROBIC AND ANAEROBIC Blood Culture results may not be optimal due to an inadequate volume of blood received in culture bottles   Culture   Final    NO GROWTH < 24 HOURS Performed at [facility], [place] [address], [place], [place] [address]    Report Status PENDING  Incomplete  Blood Culture (routine x 2)     Status: None (Preliminary result)   Collection Time: 09/20/18  6:30 PM  Result Value Ref Range Status   Specimen Description BLOOD RIGHT ANTECUBITAL  Final   Special Requests   Final    BOTTLES DRAWN AEROBIC AND ANAEROBIC Blood Culture  adequate volume   Culture   Final    NO GROWTH < 24 HOURS Performed at [facility], [place] [address], [place], [place] [address]    Report Status PENDING  Incomplete  Urine culture     Status: Abnormal (Preliminary result)   Collection Time: 09/20/18  8:20 PM  Result Value Ref Range Status   Specimen Description URINE, CATHETERIZED  Final   Special Requests   Final    NONE Performed at [facility], [place] [address], [place], [place] [address]    Culture >=100,000 COLONIES/mL GRAM NEGATIVE RODS (A)  Final   Report Status PENDING  Incomplete     Medications:   . aspirin EC  81 mg Oral Daily  . atorvastatin  20 mg Oral q1800  . diltiazem  180 mg Oral Daily  . enoxaparin (LOVENOX) injection  40 mg Subcutaneous Daily  . gabapentin  300 mg Oral QHS  . sodium chloride  1 drop Both Eyes QHS  . sodium chloride flush  3 mL Intravenous Q12H   Continuous Infusions: . sodium chloride    . cefTRIAXone (ROCEPHIN)  IV Stopped (09/21/18 2146)      LOS: 0 days   Charlynne Cousins  [facility]  09/22/2018, 9:42 AM

## 2018-09-23 DIAGNOSIS — N390 Urinary tract infection, site not specified: Secondary | ICD-10-CM | POA: Diagnosis not present

## 2018-09-23 DIAGNOSIS — R339 Retention of urine, unspecified: Secondary | ICD-10-CM | POA: Diagnosis not present

## 2018-09-23 LAB — URINE CULTURE

## 2018-09-23 MED ORDER — CEPHALEXIN 500 MG PO CAPS: 500.0000 mg | ORAL_CAPSULE | Freq: Three times a day (TID) | ORAL | 0 refills | Status: AC

## 2018-09-23 NOTE — Progress Notes (Signed)
Andrew [name] to be D/C'd  per MD order. Discussed with the patient and all questions fully answered.  VSS, Skin clean, dry and intact without evidence of skin break down, no evidence of skin tears noted.  IV catheter discontinued intact. Site without signs and symptoms of complications. Dressing and pressure applied.  An After Visit Summary was printed and given to the patient. Patient received prescription.  D/c education completed with patient/family including follow up instructions, medication list, d/c activities limitations if indicated, with other d/c instructions as indicated by MD - patient able to verbalize understanding, all questions fully answered.   Patient instructed to return to ED, call 911, or call MD for any changes in condition.   Patient to be escorted via [place], and D/C home via private auto.

## 2018-09-23 NOTE — Discharge Summary (Signed)
Physician Discharge Summary  WILMORE HOLSOMBACK FTD:[id number] DOB: Jun 06, 1932 DOA: 09/20/2018  PCP: [name], MD  Admit date: 09/20/2018 Discharge date: 09/23/2018  Admitted From: Home Disposition:  Home  Recommendations for Outpatient Follow-up:  1. Follow up with PCP in 1-2 weeks   Home Health:Yes Equipment/Devices:None  Discharge Condition:stable CODE STATUS: Full Diet recommendation: Heart Healthy  Brief/Interim Summary: 85 y.o. 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  Discharge Diagnoses:  Principal Problem:   Acute UTI Active Problems:   Thoracic aortic aneurysm without rupture ([place])   Coronary artery disease   Urinary retention   Lower urinary tract infectious disease  Acute UTI in the setting of urinary retention with self-catheterization: He was not septic on admission, urine culture grew more than 100,000 colonies of E. coli sensitive to Keflex which she will continue at home for a total of 7 days. Mild episode of hematuria with is now resolved.  Coronary artery disease: Asymptomatic continue aspirin and Lipitor.  Discharge Instructions  Discharge Instructions    Diet - low sodium heart healthy   Complete by:  As directed    Increase activity slowly   Complete by:  As directed      Allergies as of 09/23/2018   No Known Allergies     Medication List    TAKE these medications   aspirin 81 MG tablet Take 81 mg by mouth daily.   atorvastatin 20 MG tablet Commonly known as:  LIPITOR Take 1 tablet (20 mg total) by mouth daily at 6 PM.   cephALEXin 500 MG capsule Commonly known as:  KEFLEX Take 1 capsule (500 mg total) by mouth 3 (three) times daily for 5 days.   Co-Enzyme Q-10 100 MG Caps Take 100 mg by mouth daily.   diltiazem 180 MG 24 hr capsule Commonly known as:   DILACOR XR Take 1 capsule (180 mg total) by mouth daily.   ferrous sulfate 325 (65 FE) MG tablet Take 325 mg by mouth daily with breakfast.   gabapentin 300 MG capsule Commonly known as:  NEURONTIN Take 300 mg by mouth at bedtime.   Krill Oil 500 MG Caps Take 500 mg by mouth daily.   NASACORT ALLERGY 24HR 55 MCG/ACT Aero nasal inhaler Generic drug:  triamcinolone Place 2 sprays into the nose daily as needed (for allergies).   nitroGLYCERIN 0.2 mg/hr patch Commonly known as:  NITRODUR - Dosed in mg/24 hr Apply 1/4th patch to affected area, change daily   oxymetazoline 0.05 % nasal spray Commonly known as:  AFRIN Place 1 spray into both nostrils at bedtime as needed for congestion.   PRESERVISION AREDS 2 PO Take 1 capsule by mouth 2 (two) times daily.   sodium chloride 5 % ophthalmic ointment Commonly known as:  MURO 062 Place 1 application into both eyes at bedtime.   vitamin B-12 1000 MCG tablet Commonly known as:  CYANOCOBALAMIN Take 1,000 mcg by mouth daily.   vitamin C 250 MG tablet Commonly known as:  ASCORBIC ACID Take 250 mg by mouth daily.   Vitamin D3 25 MCG (1000 UT) Caps Take 1,000 Units by mouth daily.       No Known Allergies  Consultations:  None   Procedures/Studies: Dg Chest Port 1 View  Result Date: 09/20/2018 CLINICAL DATA:  Weakness EXAM: PORTABLE CHEST 1 VIEW COMPARISON:  03/18/2017 FINDINGS: Prior CABG.  Heart is borderline in size. Dilatation of the descending thoracic aorta as seen on prior chest x-ray and CT. Blunting of the right costophrenic angle is stable since prior study, likely scarring. Scarring at the right lung base. Left lung clear. No effusions or acute bony abnormality. IMPRESSION: Stable dilatation of the descending thoracic aorta. Mild cardiomegaly. Parenchymal and pleural thickening and scarring at the right lung base. Electronically Signed   By: Rolm Baptise M.D.   On: 09/20/2018 18:38     Subjective: No  complains  Discharge Exam: Vitals:   09/22/18 2108 09/23/18 0534  BP: 113/79 123/80  Pulse: 68 72  Resp: 17 18  Temp: 98.5 F (36.9 C) 98.2 F (36.8 C)  SpO2: 93% 98%     General: Pt is alert, awake, not in acute distress Cardiovascular: RRR, S1/S2 +, no rubs, no gallops Respiratory: CTA bilaterally, no wheezing, no rhonchi Abdominal: Soft, NT, ND, bowel sounds + Extremities: no edema, no cyanosis    The results of significant diagnostics from this hospitalization (including imaging, microbiology, ancillary and laboratory) are listed below for reference.     Microbiology: Recent Results (from the past 240 hour(s))  Blood Culture (routine x 2)     Status: None (Preliminary result)   Collection Time: 09/20/18  6:26 PM  Result Value Ref Range Status   Specimen Description BLOOD LEFT ANTECUBITAL  Final   Special Requests   Final    BOTTLES DRAWN AEROBIC AND ANAEROBIC Blood Culture results may not be optimal due to an inadequate volume of blood received in culture bottles Performed at [facility] [address], [place], [place] [address]    Culture NO GROWTH 3 DAYS  Final   Report Status PENDING  Incomplete  Blood Culture (routine x 2)     Status: None (Preliminary result)   Collection Time: 09/20/18  6:30 PM  Result Value Ref Range Status   Specimen Description BLOOD RIGHT ANTECUBITAL  Final   Special Requests   Final    BOTTLES DRAWN AEROBIC AND ANAEROBIC Blood Culture adequate volume Performed at [facility], [place] [address], [place], [place] [address]    Culture NO GROWTH 3 DAYS  Final   Report Status PENDING  Incomplete  Urine culture     Status: Abnormal (Preliminary result)   Collection Time: 09/20/18  8:20 PM  Result Value Ref Range Status   Specimen Description URINE, CATHETERIZED  Final   Special Requests NONE  Final   Culture >=100,000 COLONIES/mL ESCHERICHIA COLI (A)  Final   Report Status PENDING  Incomplete   Organism ID, Bacteria  ESCHERICHIA COLI (A)  Final      Susceptibility   Escherichia coli - MIC*    AMPICILLIN >=32 RESISTANT Resistant     CEFAZOLIN <=4 SENSITIVE Sensitive     CEFTRIAXONE <=1 SENSITIVE Sensitive     CIPROFLOXACIN <=0.25 SENSITIVE Sensitive     GENTAMICIN <=1 SENSITIVE Sensitive     IMIPENEM <=0.25 SENSITIVE Sensitive     NITROFURANTOIN <=16 SENSITIVE Sensitive     TRIMETH/SULFA <=20 SENSITIVE Sensitive     AMPICILLIN/SULBACTAM >=32 RESISTANT Resistant     PIP/TAZO <=4 SENSITIVE Sensitive     Extended ESBL Value in next row Sensitive      NEGATIVEPerformed at [facility] [address], [place], [place] [address]    * >=100,000 COLONIES/mL ESCHERICHIA COLI     Labs: BNP (last 3 results) No results for input(s): BNP in the last 8760  hours. Basic Metabolic Panel: Recent Labs  Lab 09/20/18 1825 09/21/18 0340  NA 136 138  K 3.8 4.1  CL 103 105  CO2 25 28  GLUCOSE 141* 93  BUN 17 12  CREATININE 1.17 0.98  CALCIUM 8.8* 8.2*   Liver Function Tests: Recent Labs  Lab 09/20/18 1825  AST 26  ALT 14  ALKPHOS 95  BILITOT 0.6  PROT 6.7  ALBUMIN 3.4*   No results for input(s): LIPASE, AMYLASE in the last 168 hours. No results for input(s): AMMONIA in the last 168 hours. CBC: Recent Labs  Lab 09/20/18 1825 09/21/18 0340  WBC 7.6 4.4  NEUTROABS 6.8 3.6  HGB 13.2 11.2*  HCT 42.4 35.7*  MCV 94.4 93.0  PLT 183 149*   Cardiac Enzymes: Recent Labs  Lab 09/20/18 1917  TROPONINI <0.03   BNP: Invalid input(s): POCBNP CBG: Recent Labs  Lab 09/20/18 1829  GLUCAP 118*   D-Dimer No results for input(s): DDIMER in the last 72 hours. Hgb A1c No results for input(s): HGBA1C in the last 72 hours. Lipid Profile No results for input(s): CHOL, HDL, LDLCALC, TRIG, CHOLHDL, LDLDIRECT in the last 72 hours. Thyroid function studies No results for input(s): TSH, T4TOTAL, T3FREE, THYROIDAB in the last 72 hours.  Invalid input(s): FREET3 Anemia work up No results for  input(s): VITAMINB12, FOLATE, FERRITIN, TIBC, IRON, RETICCTPCT in the last 72 hours. Urinalysis    Component Value Date/Time   COLORURINE YELLOW 09/20/2018 2020   APPEARANCEUR CLEAR 09/20/2018 2020   LABSPEC 1.014 09/20/2018 2020   PHURINE 5.0 09/20/2018 2020   GLUCOSEU NEGATIVE 09/20/2018 2020   HGBUR SMALL (A) 09/20/2018 2020   BILIRUBINUR NEGATIVE 09/20/2018 2020   KETONESUR NEGATIVE 09/20/2018 2020   PROTEINUR NEGATIVE 09/20/2018 2020   UROBILINOGEN 0.2 06/09/2009 0918   NITRITE POSITIVE (A) 09/20/2018 2020   LEUKOCYTESUR SMALL (A) 09/20/2018 2020   Sepsis Labs Invalid input(s): PROCALCITONIN,  WBC,  LACTICIDVEN Microbiology Recent Results (from the past 240 hour(s))  Blood Culture (routine x 2)     Status: None (Preliminary result)   Collection Time: 09/20/18  6:26 PM  Result Value Ref Range Status   Specimen Description BLOOD LEFT ANTECUBITAL  Final   Special Requests   Final    BOTTLES DRAWN AEROBIC AND ANAEROBIC Blood Culture results may not be optimal due to an inadequate volume of blood received in culture bottles Performed at [facility], [place] [address] [place], [place] [address]    Culture NO GROWTH 3 DAYS  Final   Report Status PENDING  Incomplete  Blood Culture (routine x 2)     Status: None (Preliminary result)   Collection Time: 09/20/18  6:30 PM  Result Value Ref Range Status   Specimen Description BLOOD RIGHT ANTECUBITAL  Final   Special Requests   Final    BOTTLES DRAWN AEROBIC AND ANAEROBIC Blood Culture adequate volume Performed at [facility], [place] [address], [place], [place] [address]    Culture NO GROWTH 3 DAYS  Final   Report Status PENDING  Incomplete  Urine culture     Status: Abnormal (Preliminary result)   Collection Time: 09/20/18  8:20 PM  Result Value Ref Range Status   Specimen Description URINE, CATHETERIZED  Final   Special Requests NONE  Final   Culture >=100,000 COLONIES/mL ESCHERICHIA COLI (A)  Final   Report Status  PENDING  Incomplete   Organism ID, Bacteria ESCHERICHIA COLI (A)  Final      Susceptibility  Escherichia coli - MIC*    AMPICILLIN >=32 RESISTANT Resistant     CEFAZOLIN <=4 SENSITIVE Sensitive     CEFTRIAXONE <=1 SENSITIVE Sensitive     CIPROFLOXACIN <=0.25 SENSITIVE Sensitive     GENTAMICIN <=1 SENSITIVE Sensitive     IMIPENEM <=0.25 SENSITIVE Sensitive     NITROFURANTOIN <=16 SENSITIVE Sensitive     TRIMETH/SULFA <=20 SENSITIVE Sensitive     AMPICILLIN/SULBACTAM >=32 RESISTANT Resistant     PIP/TAZO <=4 SENSITIVE Sensitive     Extended ESBL Value in next row Sensitive      NEGATIVEPerformed at [facility] [address], [place],  [address]    * >=100,000 COLONIES/mL ESCHERICHIA COLI     Time coordinating discharge: 40 minutes  SIGNED:   Charlynne Cousins, MD  [facility]

## 2018-09-23 NOTE — Care Management Note (Signed)
Case Management Note  Patient Details  Name: Andrew [name] MRN: [id number] Date of Birth: 07/20/1932  Subjective/Objective:                    Action/Plan:  Discussed discharge planning with patient and wife at bedside.   Patient has PCP transportation to appointments and is able to get medications. Patient has two walkers at home and does not want 3 in 1.   PT recommended home health PT, both patient and wife do not think he needs home health PT at this time.  Expected Discharge Date:  09/23/18               Expected Discharge Plan:  Home/Self Care  In-House Referral:  NA  Discharge planning Services  CM Consult  Post Acute Care Choice:  Home Health Choice offered to:  Patient, Spouse  DME Arranged:  N/A DME Agency:  NA  HH Arranged:  Patient Refused [place] Agency:  NA  Status of Service:  Completed, signed off  If discussed at [place] of Stay Meetings, dates discussed:    Additional Comments:  Marilu Favre, RN 09/23/2018, 9:44 AM

## 2018-09-25 LAB — CULTURE, BLOOD (ROUTINE X 2)
CULTURE: NO GROWTH
Culture: NO GROWTH
Special Requests: ADEQUATE

## 2018-10-01 ENCOUNTER — Telehealth: Payer: Self-pay | Admitting: Internal Medicine

## 2018-10-01 NOTE — Telephone Encounter (Signed)
New Message   Patient c/o Palpitations:  High priority if patient c/o lightheadedness, shortness of breath, or chest pain  1) How long have you had palpitations/irregular HR/ Afib? Are you having the symptoms now? About four days, patient states it's in rhythm right now   2) Are you currently experiencing lightheadedness, SOB or CP? Chest pain patient isn't sure if it's acid reflux or not  3) Do you have a history of afib (atrial fibrillation) or irregular heart rhythm? NO  4) Have you checked your BP or HR? (document readings if available): Not today at all  5) Are you experiencing any other symptoms? Just the Acid reflux

## 2018-10-01 NOTE — Telephone Encounter (Signed)
Returned call to patient he stated he can hear heart beating in ears.Stated heart beat irregular at times.No chest pain.No sob.Patient requesting appointment. Appointment scheduled with Kerin Ransom PA 10/09/18 at 11:30 am.

## 2018-10-09 ENCOUNTER — Ambulatory Visit: Payer: Medicare Other | Admitting: Cardiology

## 2018-10-11 ENCOUNTER — Other Ambulatory Visit: Payer: Self-pay | Admitting: *Deleted

## 2018-10-11 ENCOUNTER — Telehealth: Payer: Self-pay | Admitting: *Deleted

## 2018-10-11 MED ORDER — NITROGLYCERIN 0.2 MG/HR TD PT24: MEDICATED_PATCH | TRANSDERMAL | 1 refills | Status: DC

## 2018-10-11 NOTE — Telephone Encounter (Signed)
Per Dr Barbaraann Barthel, I encouraged him to continue with his exercises daily if he can. We alsoincreased his nitro patch to 1/2 patch. He states he had not headaches with the 1/4 patch regimen. Told himnormally we'd put him in physical therapy but will try to avoid this for now since most of the outpatient and ambulatory rehab facilities are closed due to [place].   Patient voiced understanding and I called in a new Rx for NTG patches, take 1/2 patch every 24 hours to the affected area, since he stated he was almost out of patches.

## 2018-10-14 ENCOUNTER — Telehealth: Payer: Self-pay

## 2018-10-14 NOTE — Telephone Encounter (Signed)
See phone note

## 2018-10-17 ENCOUNTER — Ambulatory Visit: Payer: Medicare Other | Admitting: Family Medicine

## 2018-10-22 ENCOUNTER — Telehealth: Payer: Self-pay | Admitting: Internal Medicine

## 2018-10-22 NOTE — Telephone Encounter (Signed)
Returned call to patient he stated he is having frequent palpitations.Stated palpitations are worse at night when he is getting ready for bed.No chest pain.He had a visit with Dr.Hilty and cancelled it due to Covid-19, but he would like to reschedule.Stated he prefers a tele visit.Advised I will send message to Dr.Hilty's RN to schedule.

## 2018-10-22 NOTE — Telephone Encounter (Signed)
Patient c/o Palpitations:  High priority if patient c/o lightheadedness, shortness of breath, or chest pain  1) How long have you had palpitations/irregular HR/ Afib? Are you having the symptoms now?about 1 month  2) Are you currently experiencing lightheadedness, SOB or CP? no  3) Do you have a history of afib (atrial fibrillation) or irregular heart rhythm? no  4) Have you checked your BP or HR? (document readings if available):   5) Are you experiencing any other symptoms? Just an overall off feeling. Feels like his heart is pre-beating. He had an appt with a PA, but recently cancelled because he didn't want to risk COVID exposure

## 2018-10-23 NOTE — Telephone Encounter (Signed)
Yes .. ok to add him today at 1:30

## 2018-10-23 NOTE — Telephone Encounter (Signed)
Called patient to offer telephone visit today at 1:30p.  Patient reports that he is not too concerned with his palpitations at the present time. He walks the stairs in his home 5 times twice a day without any problems. States that if his symptoms worsen or if he develops shortness of breath, he will call the office again.

## 2018-10-23 NOTE — Telephone Encounter (Signed)
Ok thanks 

## 2018-11-12 ENCOUNTER — Other Ambulatory Visit: Payer: Self-pay | Admitting: Family Medicine

## 2018-11-12 DIAGNOSIS — M7989 Other specified soft tissue disorders: Secondary | ICD-10-CM

## 2018-11-12 DIAGNOSIS — M79605 Pain in left leg: Secondary | ICD-10-CM

## 2018-11-13 ENCOUNTER — Inpatient Hospital Stay: Admission: RE | Admit: 2018-11-13 | Payer: Medicare Other | Source: Ambulatory Visit

## 2018-11-14 ENCOUNTER — Ambulatory Visit
Admission: RE | Admit: 2018-11-14 | Discharge: 2018-11-14 | Disposition: A | Discharge status: Home / Self Care | Payer: Medicare Other | Source: Ambulatory Visit | Attending: Family Medicine | Admitting: Family Medicine

## 2018-11-14 ENCOUNTER — Other Ambulatory Visit: Payer: Self-pay

## 2018-11-14 ENCOUNTER — Ambulatory Visit: Payer: Medicare Other | Admitting: Family Medicine

## 2018-11-14 DIAGNOSIS — M7989 Other specified soft tissue disorders: Secondary | ICD-10-CM

## 2018-11-14 DIAGNOSIS — M79605 Pain in left leg: Secondary | ICD-10-CM

## 2018-11-19 ENCOUNTER — Encounter: Payer: Self-pay | Admitting: Family Medicine

## 2018-11-19 ENCOUNTER — Other Ambulatory Visit: Payer: Self-pay

## 2018-11-19 ENCOUNTER — Ambulatory Visit: Payer: Medicare Other | Admitting: Family Medicine

## 2018-11-19 VITALS — BP 130/78 | Ht 68.0 in | Wt 146.6 lb

## 2018-11-19 DIAGNOSIS — M25552 Pain in left hip: Secondary | ICD-10-CM | POA: Diagnosis not present

## 2018-11-19 MED ORDER — METHYLPREDNISOLONE ACETATE 40 MG/ML IJ SUSP
40.0000 mg | Freq: Once | INTRAMUSCULAR | Status: AC
  Administered 2018-11-19: 12:00:00 40 mg via INTRA_ARTICULAR

## 2018-11-19 NOTE — Progress Notes (Signed)
  Subjective:     Patient ID: MALAQUIAS LENKER, male   DOB: 06/14/1932, 85 y.o.   MRN: [id number]  HPI  2/13: Josh is coming in today for L buttock pain that he has had for 12 years. He describes the pain as burning or stinging in the gluteal area, that is worse with sitting for long periods. He describes radiation of the pain upwards to about the level of his SI joint. He also endorses occasional pain radiating down the lateral thigh and into the posterior knee as well as lateral ankle, all of which he has associated with itching in those areas and occasional redness of the skin. As of 12 months ago he reports worsening of his symptoms, and new similar symptoms in the R side. Now, he has a hard time walking because of instability and pain, due to a feeling that there is a "tight rubber band" in his buttock and upper leg. 2 weeks ago his pain was so significant that he could not walk, so he presented to a provider who gave him an IM injection that did improve his symptoms.  Over the 12 years of having these symptoms, he has been seen for the pain by multiple specialists, had 3 spine surgeries (2x on L4/5 and 1x on L1/2, all in 2015-2016), done PT, acupuncture, and had injections, none of which provided relief of his pain.   4/28: Patient reports his pain in left buttock has continued since last visit. Has been doing hip exercises daily - today could hardly walk after doing these but this is not typically a problem. Icing has not helped. Elevating also not helpful. Pain is sharp posterior to left greater trochanter. Using nitro patches - ok with 1/4th patch but hasn't felt like it's worked - could not tolerate 1/2 patch. No skin changes, numbness.  Review of Systems As above.    Objective:   Physical Exam  Gen: NAD, comfortable in exam room  Left hip: No deformity, swelling, bruising. FROM with 5/5 strength except 4/5 hip abduction. Tenderness to palpation posterior to greater trochanter  and within external rotators, glut medius. NVI distally. Negative logroll.  Right hip: No deformity. FROM with 5/5 strength. No tenderness to palpation. NVI distally. Negative logroll.    Assessment/Plan:  1. Left hip pain - 2/2 glut medius weakness/tendinopathy and external rotator tendinopathy.  Continue home exercises.  Trial of ultrasound guided injection today given lack of response to prior PT, home exercises, nitro patches.  F/u in 6 weeks.  Consider repeat PT if not improving.  After informed written consent timeout was performed.  Patient was lying prone on exam table.  Area overlying left hip piriformis in maximal area of pain prepped.  Then utilizing ultrasound guidance, patient was injected with 3:1 marcaine: depomedrol over superficial aspect of piriformis.  Patient tolerated procedure well without immediate complications.

## 2018-11-19 NOTE — Patient Instructions (Signed)
You have gluteus medius weakness/tendinopathy and external rotator tendinopathy. Wait a few days then restart hip side raises and standing hip rotation exercises - work your way up to 3 sets of 10 once a day. Add ankle weight if these become too easy. You were given an injection today as well. Consider physical therapy if you continue to struggle.  If injection helps for several months we can repeat this as well. Follow up with me in 6 weeks.

## 2018-11-23 ENCOUNTER — Other Ambulatory Visit: Payer: Self-pay | Admitting: Internal Medicine

## 2018-11-23 NOTE — Telephone Encounter (Signed)
Atorvastatin 20 mg

## 2018-11-25 ENCOUNTER — Other Ambulatory Visit: Payer: Self-pay | Admitting: Internal Medicine

## 2018-12-17 ENCOUNTER — Other Ambulatory Visit: Payer: Self-pay | Admitting: *Deleted

## 2018-12-17 DIAGNOSIS — M25562 Pain in left knee: Secondary | ICD-10-CM | POA: Insufficient documentation

## 2018-12-17 DIAGNOSIS — Z01812 Encounter for preprocedural laboratory examination: Secondary | ICD-10-CM

## 2018-12-17 DIAGNOSIS — I712 Thoracic aortic aneurysm, without rupture, unspecified: Secondary | ICD-10-CM

## 2018-12-17 DIAGNOSIS — M7042 Prepatellar bursitis, left knee: Secondary | ICD-10-CM | POA: Insufficient documentation

## 2018-12-18 ENCOUNTER — Telehealth: Payer: Self-pay | Admitting: Internal Medicine

## 2018-12-18 NOTE — Telephone Encounter (Signed)
New Message    Pt is returning phone call about scheduling a ct scan   Please call

## 2018-12-31 ENCOUNTER — Ambulatory Visit: Payer: Medicare Other | Admitting: Family Medicine

## 2018-12-31 LAB — BASIC METABOLIC PANEL
BUN/Creatinine Ratio: 16 (ref 10–24)
BUN: 19 mg/dL (ref 8–27)
CALCIUM: 9.5 mg/dL (ref 8.6–10.2)
CHLORIDE: 99 mmol/L (ref 96–106)
CO2: 26 mmol/L (ref 20–29)
Creatinine, Ser: 1.22 mg/dL (ref 0.76–1.27)
GFR calc non Af Amer: 54 mL/min/{1.73_m2} — ABNORMAL LOW (ref >59)
GFR, EST AFRICAN AMERICAN: 63 mL/min/{1.73_m2} (ref >59)
Glucose: 89 mg/dL (ref 65–99)
POTASSIUM: 5.5 mmol/L — AB (ref 3.5–5.2)
Sodium: 138 mmol/L (ref 134–144)

## 2019-01-10 ENCOUNTER — Other Ambulatory Visit: Payer: Self-pay

## 2019-01-10 ENCOUNTER — Ambulatory Visit (INDEPENDENT_AMBULATORY_CARE_PROVIDER_SITE_OTHER)
Admission: RE | Admit: 2019-01-10 | Discharge: 2019-01-10 | Disposition: A | Discharge status: Home / Self Care | Payer: Medicare Other | Source: Ambulatory Visit | Attending: Internal Medicine | Admitting: Internal Medicine

## 2019-01-10 DIAGNOSIS — I712 Thoracic aortic aneurysm, without rupture, unspecified: Secondary | ICD-10-CM

## 2019-01-10 MED ORDER — IOHEXOL 350 MG/ML SOLN
100.0000 mL | Freq: Once | INTRAVENOUS | Status: AC | PRN
  Administered 2019-01-10: 100 mL via INTRAVENOUS

## 2019-01-15 ENCOUNTER — Ambulatory Visit (INDEPENDENT_AMBULATORY_CARE_PROVIDER_SITE_OTHER): Payer: Medicare Other | Admitting: Internal Medicine

## 2019-01-15 ENCOUNTER — Encounter: Payer: Self-pay | Admitting: Internal Medicine

## 2019-01-15 ENCOUNTER — Other Ambulatory Visit: Payer: Self-pay

## 2019-01-15 VITALS — BP 118/62 | Temp 98.2°F | Ht 68.0 in | Wt 150.6 lb

## 2019-01-15 DIAGNOSIS — Z79899 Other long term (current) drug therapy: Secondary | ICD-10-CM | POA: Diagnosis not present

## 2019-01-15 DIAGNOSIS — I498 Other specified cardiac arrhythmias: Secondary | ICD-10-CM

## 2019-01-15 DIAGNOSIS — I712 Thoracic aortic aneurysm, without rupture, unspecified: Secondary | ICD-10-CM

## 2019-01-15 DIAGNOSIS — Z951 Presence of aortocoronary bypass graft: Secondary | ICD-10-CM

## 2019-01-15 DIAGNOSIS — I499 Cardiac arrhythmia, unspecified: Secondary | ICD-10-CM

## 2019-01-15 NOTE — Patient Instructions (Addendum)
Medication Instructions:  Continue current medications If you need a refill on your cardiac medications before your next appointment, please call your pharmacy.   Lab work: BMET If you have labs (blood work) drawn today and your tests are completely normal, you will receive your results only by: Marland Kitchen MyChart Message (if you have MyChart) OR . A paper copy in the mail If you have any lab test that is abnormal or we need to change your treatment, we will call you to review the results.  Testing/Procedures: Dr. Debara Pickett has ordered an echocardiogram. This is done at [address] - [address]  Dr. Debara Pickett has ordered a 14 day event monitor. You will receive the monitor in the mail with insturctions   Follow-Up: At [facility], you and your health needs are our priority.  As part of our continuing mission to provide you with exceptional heart care, we have created designated Provider Care Teams.  These Care Teams include your primary Cardiologist (physician) and Advanced Practice Providers (APPs -  Physician Assistants and Nurse Practitioners) who all work together to provide you with the care you need, when you need it. . You will need a follow up appointment in 4 weeks in the office with Dr. Debara Pickett  Any Other Special Instructions Will Be Listed Below (If Applicable). You have been referred to Dr. Servando Snare - cardiothoracic surgeon

## 2019-01-15 NOTE — Progress Notes (Signed)
OFFICE NOTE  Chief Complaint:  Follow-up  Primary Care Physician: [name], MD  HPI:  Andrew [name] is a 85 y.o. male with a past medial history significant for coronary artery disease status post CABG in 2010 with prior history of angioplasty and PCI dating back at least 10 years before that.  He also has history of thoracic aortic aneurysm with a 4.3 cm ascending and 4.5 cm descending aneurysm.  Chronic kidney disease, hypertension, arthritis, skin cancer, and ongoing back problem which she is having evaluated for injection.  There is a listed a history of paroxysmal atrial fibrillation however I cannot find evidence to support this.  He is worn a monitor, last in 2017 which showed PACs and PVCs.  There is no A. fib today.  He does have PVCs.  He is not anticoagulated beyond aspirin.  Testing from his PCP showed total cholesterol 150, HDL 53, LDL 83 and triglycerides 74.  01/15/2019  Andrew [name] returns today for follow-up.  He was previously followed by Dr. Wynonia Lawman and establish care with me in March 2020 prior to the [place] pandemic.  He underwent scheduled repeat CT angiography for aortic aneurysm.  This demonstrated stable 4.2 cm ascending thoracic aortic aneurysm, however did also demonstrate an enlarging 5.3 cm mid descending thoracic aortic aneurysm (previously measuring 4.8 cm).  Referral to cardiothoracic surgery was recommended.  Additionally, there were incidentally noted mild T8 and T9 vertebral body compression deformities.  Symptomatically he is doing fairly well.  He complains of some left knee pain left lower leg swelling.  Today he is noted to be in ventricular bigeminy, on EKG.  He is noted recently having more frequent palpitations.  He says he has a longstanding history of palpitations and has been monitored for that several times in the past.  PMHx:  Past Medical History:  Diagnosis Date  . Angioedema 06/01/2015  . Arthritis   . Back pain   . 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 Chronic kidney disease    ? bph, pt self catheterizes himself on schedule  . Complication of anesthesia    had to have Foley post op cabg and lumb lam  . Coronary artery disease   . Hypertension   . Thoracic aortic aneurysm ([place])    09/29/16 CT: 4.3 cm ascending, 4.5 cm descending. 1 yr f/u rec  . Urinary retention   . Wears glasses     Past Surgical History:  Procedure Laterality Date  . BACK SURGERY  2012   lumb lam  . CARDIAC CATHETERIZATION     2010-per patient  . COLONOSCOPY    . CORONARY ARTERY BYPASS GRAFT     2010  . EPIGASTRIC HERNIA REPAIR N/A 09/11/2017   Procedure: HERNIA REPAIR EPIGASTRIC ADULT;  Surgeon: Rolm Bookbinder, MD;  Location: [facility];  Service: General;  Laterality: N/A;  . HERNIA REPAIR     rih 1981  . HERNIA REPAIR  09/11/2017   with mesh  . INGUINAL HERNIA REPAIR  07/30/2012   Procedure: HERNIA REPAIR INGUINAL ADULT;  Surgeon: Rolm Bookbinder, MD;  Location: [facility];  Service: General;  Laterality: Left;  Left Hernia Site  . INSERTION OF MESH  07/30/2012   Procedure: INSERTION OF MESH;  Surgeon: Rolm Bookbinder, MD;  Location: [facility];  Service: General;  Laterality: Left;  Left Inguinal Hernia  . INSERTION OF MESH N/A 09/11/2017   Procedure:  INSERTION OF MESH;  Surgeon: Rolm Bookbinder, MD;  Location: [place] [facility];  Service: General;  Laterality: N/A;  . LAPAROSCOPY N/A 09/11/2017   Procedure: DIAGNOSTIC LAPAOSCOPY;  Surgeon: Rolm Bookbinder, MD;  Location: [facility];  Service: General;  Laterality: N/A;  ERAS pathway  . SPINE SURGERY     lumbar laminectomy  . TONSILLECTOMY      FAMHx:  Family History  Problem Relation Age of Onset  . Allergic rhinitis Son   . Coronary artery disease Mother   . Angioedema Neg Hx   . Asthma Neg Hx   . Atopy Neg Hx   . Eczema Neg Hx   . Immunodeficiency Neg Hx   . Urticaria Neg Hx     SOCHx:   reports that he  quit smoking about 34 years ago. He has a 8.00 pack-year smoking history. He has never used smokeless tobacco. He reports that he does not drink alcohol or use drugs.  ALLERGIES:  No Known Allergies  ROS: Pertinent items noted in HPI and remainder of comprehensive ROS otherwise negative.  HOME MEDS: Current Outpatient Medications on File Prior to Visit  Medication Sig Dispense Refill  . aspirin 81 MG tablet Take 81 mg by mouth daily.    Marland Kitchen atorvastatin (LIPITOR) 20 MG tablet TAKE 1 TABLET ONCE DAILY AT 6 PM. 90 tablet 0  . Cholecalciferol (VITAMIN D3) 1000 units CAPS Take 1,000 Units by mouth daily.    Marland Kitchen Co-Enzyme Q-10 100 MG CAPS Take 100 mg by mouth daily.     Marland Kitchen DILT-XR 180 MG 24 hr capsule TAKE 1 CAPSULE DAILY. 30 capsule 7  . ferrous sulfate 325 (65 FE) MG tablet Take 325 mg by mouth daily with breakfast.    . gabapentin (NEURONTIN) 300 MG capsule Take 300 mg by mouth at bedtime.     . Multiple Vitamins-Minerals (PRESERVISION AREDS 2 PO) Take 1 capsule by mouth 2 (two) times daily.    . nitroGLYCERIN (NITRODUR - DOSED IN MG/24 HR) 0.2 mg/hr patch Use 1/2 patch daily to the affected area 30 patch 1  . oxymetazoline (AFRIN) 0.05 % nasal spray Place 1 spray into both nostrils at bedtime as needed for congestion.    . sodium chloride (MURO 128) 5 % ophthalmic ointment Place 1 application into both eyes at bedtime.    . triamcinolone (NASACORT ALLERGY 24HR) 55 MCG/ACT AERO nasal inhaler Place 2 sprays into the nose daily as needed (for allergies).    . vitamin B-12 (CYANOCOBALAMIN) 1000 MCG tablet Take 1,000 mcg by mouth daily.     . vitamin C (ASCORBIC ACID) 250 MG tablet Take 250 mg by mouth daily.     Javier Docker Oil 500 MG CAPS Take 500 mg by mouth daily.     No current facility-administered medications on file prior to visit.     LABS/IMAGING: No results found for this or any previous visit (from the past 48 hour(s)). No results found.  LIPID PANEL: No results found for: CHOL, TRIG,  HDL, CHOLHDL, VLDL, LDLCALC, LDLDIRECT   WEIGHTS: Wt Readings from Last 3 Encounters:  01/15/19 150 lb 9.6 oz (68.3 kg)  11/19/18 146 lb 9.6 oz (66.5 kg)  09/20/18 146 lb 9.7 oz (66.5 kg)    VITALS: BP 118/62   Temp 98.2 F (36.8 C)   Ht 5\' 8"  (1.727 m)   Wt 150 lb 9.6 oz (68.3 kg)   SpO2 99%   BMI 22.90 kg/m   EXAM: General appearance: alert and no distress  Neck: no carotid bruit, no JVD and thyroid not enlarged, symmetric, no tenderness/mass/nodules Lungs: clear to auscultation bilaterally Heart: regular rate and rhythm, S1, S2 normal, no murmur, click, rub or gallop Abdomen: soft, non-tender; bowel sounds normal; no masses,  no organomegaly Extremities: extremities normal, atraumatic, no cyanosis or edema Pulses: 2+ and symmetric Skin: Skin color, texture, turgor normal. No rashes or lesions Neurologic: Grossly normal Psych: Pleasant  EKG: Sinus rhythm with first-degree AV block and frequent PVCs and bigeminy, RBBB at 67-personally reviewed  ASSESSMENT: 1. Coronary artery disease status post CABG x4 with LIMA to LAD, free RIMA to RCA and left radial to OM (04/2009) 2. Remote history of atrial fibrillation-not anticoagulated 3. Hyperlipidemia 4. History of second-degree AV block 5. Thoracic aortic aneurysm 6. Chronic venous insufficiency 7. Ventricular bigeminy 8. RBBB  PLAN: 1.   Mr. Robarts has evidence of an enlarging descending thoracic aortic aneurysm measuring up to 5.3 cm on recent CT.  I like to refer her to Dr. Servando Snare for further evaluation and monitoring.  With regards to his ventricular bigeminy today.  He says he has had this intermittently in the past or least has had palpitations.  Is not clear what his burden of ventricular bigeminy is and although he notes his pulse rates in the 30s or 40s at times he is not overly symptomatic.  Nonetheless I recommend a 2-week monitor and will repeat an echocardiogram to assess for any cardiomyopathy.  Plan follow-up  with me afterwards.  Pixie Casino, MD, [facility], [place] Director of the Advanced Lipid Disorders &  Cardiovascular Risk Reduction Clinic Diplomate of the American Board of Clinical Lipidology Attending Cardiologist  Direct Dial: [phone]  Fax: [phone]  Website:  [email or web address] 01/15/2019, 1:00 PM

## 2019-01-16 ENCOUNTER — Telehealth: Payer: Self-pay | Admitting: *Deleted

## 2019-01-16 LAB — BASIC METABOLIC PANEL
BUN/Creatinine Ratio: 15 (ref 10–24)
BUN: 19 mg/dL (ref 8–27)
CO2: 27 mmol/L (ref 20–29)
Calcium: 9.4 mg/dL (ref 8.6–10.2)
Chloride: 100 mmol/L (ref 96–106)
Creatinine, Ser: 1.23 mg/dL (ref 0.76–1.27)
GFR calc Af Amer: 62 mL/min/{1.73_m2} (ref >59)
GFR calc non Af Amer: 54 mL/min/{1.73_m2} — ABNORMAL LOW (ref >59)
Glucose: 66 mg/dL (ref 65–99)
Potassium: 4.9 mmol/L (ref 3.5–5.2)
Sodium: 139 mmol/L (ref 134–144)

## 2019-01-16 NOTE — Telephone Encounter (Signed)
Patient scheduled for Echocardiogram on 01/27/19.  ZIO patch cannot be removed and replaced to accommodate other tests.  Please do not apply ZIO XT patch monitor until after your Echocardiogram on 01/27/19.

## 2019-01-16 NOTE — Telephone Encounter (Signed)
[employer] to mail 14 day ZIO XT long term holter monitor patch. Instructions reviewed briefly as they are included in the monitor kit.

## 2019-01-17 ENCOUNTER — Telehealth: Payer: Self-pay | Admitting: Internal Medicine

## 2019-01-17 NOTE — Telephone Encounter (Signed)
Follow up: ° ° °Patient returning your call back. Please call patient back. °

## 2019-01-27 ENCOUNTER — Ambulatory Visit (HOSPITAL_COMMUNITY): Payer: Medicare Other | Attending: Internal Medicine

## 2019-01-27 ENCOUNTER — Other Ambulatory Visit: Payer: Self-pay

## 2019-01-27 DIAGNOSIS — I2510 Atherosclerotic heart disease of native coronary artery without angina pectoris: Secondary | ICD-10-CM | POA: Insufficient documentation

## 2019-01-27 DIAGNOSIS — I499 Cardiac arrhythmia, unspecified: Secondary | ICD-10-CM | POA: Insufficient documentation

## 2019-01-27 DIAGNOSIS — I498 Other specified cardiac arrhythmias: Secondary | ICD-10-CM

## 2019-01-27 DIAGNOSIS — I10 Essential (primary) hypertension: Secondary | ICD-10-CM | POA: Insufficient documentation

## 2019-01-27 DIAGNOSIS — R008 Other abnormalities of heart beat: Secondary | ICD-10-CM | POA: Diagnosis not present

## 2019-01-27 DIAGNOSIS — Z951 Presence of aortocoronary bypass graft: Secondary | ICD-10-CM | POA: Insufficient documentation

## 2019-01-28 ENCOUNTER — Ambulatory Visit (INDEPENDENT_AMBULATORY_CARE_PROVIDER_SITE_OTHER): Payer: Medicare Other

## 2019-01-28 DIAGNOSIS — I493 Ventricular premature depolarization: Secondary | ICD-10-CM

## 2019-01-28 DIAGNOSIS — I498 Other specified cardiac arrhythmias: Secondary | ICD-10-CM

## 2019-01-28 DIAGNOSIS — I499 Cardiac arrhythmia, unspecified: Secondary | ICD-10-CM | POA: Diagnosis not present

## 2019-02-10 ENCOUNTER — Ambulatory Visit: Payer: Medicare Other | Admitting: Internal Medicine

## 2019-02-17 ENCOUNTER — Other Ambulatory Visit: Payer: Self-pay | Admitting: Internal Medicine

## 2019-02-17 DIAGNOSIS — I493 Ventricular premature depolarization: Secondary | ICD-10-CM

## 2019-02-17 DIAGNOSIS — I498 Other specified cardiac arrhythmias: Secondary | ICD-10-CM

## 2019-02-18 ENCOUNTER — Encounter: Payer: Self-pay | Admitting: Thoracic Surgery (Cardiothoracic Vascular Surgery)

## 2019-02-18 ENCOUNTER — Other Ambulatory Visit: Payer: Self-pay

## 2019-02-18 ENCOUNTER — Other Ambulatory Visit: Payer: Self-pay | Admitting: Thoracic Surgery (Cardiothoracic Vascular Surgery)

## 2019-02-18 ENCOUNTER — Other Ambulatory Visit: Payer: Self-pay | Admitting: Internal Medicine

## 2019-02-18 ENCOUNTER — Institutional Professional Consult (permissible substitution) (INDEPENDENT_AMBULATORY_CARE_PROVIDER_SITE_OTHER): Payer: Medicare Other | Admitting: Thoracic Surgery (Cardiothoracic Vascular Surgery)

## 2019-02-18 VITALS — BP 126/78 | HR 67 | Temp 97.8°F | Resp 20 | Ht 68.0 in | Wt 151.0 lb

## 2019-02-18 DIAGNOSIS — I712 Thoracic aortic aneurysm, without rupture, unspecified: Secondary | ICD-10-CM

## 2019-02-18 DIAGNOSIS — I719 Aortic aneurysm of unspecified site, without rupture: Secondary | ICD-10-CM | POA: Diagnosis not present

## 2019-02-18 DIAGNOSIS — Z951 Presence of aortocoronary bypass graft: Secondary | ICD-10-CM

## 2019-02-18 NOTE — Progress Notes (Signed)
PCP is [name], MD Referring Provider is Debara Pickett Nadean Corwin, MD  Chief Complaint  Patient presents with  . Thoracic Aortic Aneurysm    Surgical eval, CTA Chest 01/10/19, ECHO 01/27/19, HX of CABG     HPI: [name] is sent for consultation regarding thoracic aortic aneurysm.  Andrew [name] is a 85 year old man with a past medical history significant for remote tobacco abuse (quit 1986), atherosclerotic cardiovascular disease, descending thoracic aortic aneurysm, abdominal aortic aneurysm, coronary artery disease, status post coronary bypass grafting x3 in 2010, hypertension, angioedema, arthritis, chronic kidney disease, benign prostatic hypertrophy, and back pain.  He recently saw Dr. Debara Pickett.  He did a follow-up CT angiogram because of a history of ascending and descending thoracic aneurysms.  The descending aneurysm was felt to be larger although the ascending aneurysm was stable.  He is not having any chest pain, pressure, or tightness.  He does have some back and left hip pain which is chronic.  He also complains of swelling in his left leg after injuring his knee.   Past Medical History:  Diagnosis Date  . Angioedema 06/01/2015  . Arthritis   . Back pain   . 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 Chronic kidney disease    ? bph, pt self catheterizes himself on schedule  . Complication of anesthesia    had to have Foley post op cabg and lumb lam  . Coronary artery disease   . Hypertension   . Thoracic aortic aneurysm ([place])    09/29/16 CT: 4.3 cm ascending, 4.5 cm descending. 1 yr f/u rec  . Urinary retention   . Wears glasses     Past Surgical History:  Procedure Laterality Date  . BACK SURGERY  2012   lumb lam  . CARDIAC CATHETERIZATION     2010-per patient  . COLONOSCOPY    . CORONARY ARTERY BYPASS GRAFT     2010  . EPIGASTRIC HERNIA REPAIR N/A 09/11/2017   Procedure: HERNIA REPAIR EPIGASTRIC ADULT;  Surgeon: Rolm Bookbinder, MD;  Location: [facility];  Service: General;  Laterality: N/A;  . HERNIA REPAIR     rih 1981  . HERNIA REPAIR  09/11/2017   with mesh  . INGUINAL HERNIA REPAIR  07/30/2012   Procedure: HERNIA REPAIR INGUINAL ADULT;  Surgeon: Rolm Bookbinder, MD;  Location: [facility];  Service: General;  Laterality: Left;  Left Hernia Site  . INSERTION OF MESH  07/30/2012   Procedure: INSERTION OF MESH;  Surgeon: Rolm Bookbinder, MD;  Location: [facility];  Service: General;  Laterality: Left;  Left Inguinal Hernia  . INSERTION OF MESH N/A 09/11/2017   Procedure: INSERTION OF MESH;  Surgeon: Rolm Bookbinder, MD;  Location: [facility];  Service: General;  Laterality: N/A;  . LAPAROSCOPY N/A 09/11/2017   Procedure: DIAGNOSTIC LAPAOSCOPY;  Surgeon: Rolm Bookbinder, MD;  Location: [facility] [facility];  Service: General;  Laterality: N/A;  ERAS pathway  . SPINE SURGERY     lumbar laminectomy  . TONSILLECTOMY      Family History  Problem Relation Age of Onset  . Allergic rhinitis Son   . Coronary artery disease Mother   . Angioedema Neg Hx   . Asthma Neg Hx   . Atopy Neg Hx   . Eczema Neg Hx   . Immunodeficiency Neg Hx   . Urticaria Neg Hx     Social History Social History   Tobacco  Use  . Smoking status: Former Smoker    Packs/day: 1.00    Years: 8.00    Pack years: 8.00    Quit date: 07/24/1984    Years since quitting: 34.5  . Smokeless tobacco: Never Used  Substance Use Topics  . Alcohol use: No  . Drug use: No    Current Outpatient Medications  Medication Sig Dispense Refill  . aspirin 81 MG tablet Take 81 mg by mouth daily.    Marland Kitchen atorvastatin (LIPITOR) 20 MG tablet TAKE 1 TABLET ONCE DAILY AT 6 PM. 90 tablet 2  . Cholecalciferol (VITAMIN D3) 1000 units CAPS Take 1,000 Units by mouth daily.    Marland Kitchen Co-Enzyme Q-10 100 MG CAPS Take 100 mg by mouth daily.     Marland Kitchen DILT-XR 180 MG 24 hr capsule TAKE 1 CAPSULE DAILY. 30 capsule 7  . ferrous sulfate 325 (65 FE) MG tablet Take  325 mg by mouth daily with breakfast.    . gabapentin (NEURONTIN) 300 MG capsule Take 300 mg by mouth at bedtime.     . Multiple Vitamins-Minerals (PRESERVISION AREDS 2 PO) Take 1 capsule by mouth 2 (two) times daily.    . nitroGLYCERIN (NITRODUR - DOSED IN MG/24 HR) 0.2 mg/hr patch Use 1/2 patch daily to the affected area 30 patch 1  . oxymetazoline (AFRIN) 0.05 % nasal spray Place 1 spray into both nostrils at bedtime as needed for congestion.    . sodium chloride (MURO 128) 5 % ophthalmic ointment Place 1 application into both eyes at bedtime.    . triamcinolone (NASACORT ALLERGY 24HR) 55 MCG/ACT AERO nasal inhaler Place 2 sprays into the nose daily as needed (for allergies).    . vitamin B-12 (CYANOCOBALAMIN) 1000 MCG tablet Take 1,000 mcg by mouth daily.     . vitamin C (ASCORBIC ACID) 250 MG tablet Take 250 mg by mouth daily.     Javier Docker Oil 500 MG CAPS Take 500 mg by mouth daily.     No current facility-administered medications for this visit.     No Known Allergies  Review of Systems  Constitutional: Negative for activity change, appetite change and unexpected weight change.  HENT: Negative for trouble swallowing and voice change.   Eyes: Negative for visual disturbance.  Respiratory: Negative for chest tightness and shortness of breath.   Cardiovascular: Positive for palpitations and leg swelling (Left ankle). Negative for chest pain.  Genitourinary: Positive for difficulty urinating. Negative for dysuria.  Musculoskeletal: Positive for arthralgias and back pain.  Neurological: Negative for seizures and syncope.  Hematological: Negative for adenopathy. Bruises/bleeds easily.  All other systems reviewed and are negative.   BP 126/78   Pulse 67   Temp 97.8 F (36.6 C) (Skin)   Resp 20   Ht 5\' 8"  (1.727 m)   Wt 151 lb (68.5 kg)   SpO2 92% Comment: RA  BMI 22.96 kg/m  Physical Exam Vitals signs reviewed.  Constitutional:      General: He is not in acute distress.     Comments: Frail-appearing  HENT:     Head: Normocephalic and atraumatic.  Eyes:     General: No scleral icterus.    Extraocular Movements: Extraocular movements intact.     Conjunctiva/sclera: Conjunctivae normal.  Neck:     Musculoskeletal: Neck supple.     Vascular: No carotid bruit.  Cardiovascular:     Rate and Rhythm: Normal rate and regular rhythm.     Heart sounds: Murmur (2/6 systolic) present.  Pulmonary:     Effort: Pulmonary effort is normal. No respiratory distress.     Breath sounds: No wheezing or rales.     Comments: Diminished breath sounds both bases Abdominal:     General: There is no distension.     Palpations: Abdomen is soft. There is mass (Pulsatile mass easily palpable).     Tenderness: There is no abdominal tenderness.  Musculoskeletal:     Left lower leg: Edema (2+) present.  Lymphadenopathy:     Cervical: No cervical adenopathy.  Skin:    General: Skin is warm and dry.  Neurological:     General: No focal deficit present.     Mental Status: He is alert and oriented to person, place, and time.     Cranial Nerves: No cranial nerve deficit.     Gait: Gait normal.    Diagnostic Tests: CT ANGIOGRAPHY CHEST WITH CONTRAST  TECHNIQUE: Multidetector CT imaging of the chest was performed using the standard protocol during bolus administration of intravenous contrast. Multiplanar CT image reconstructions and MIPs were obtained to evaluate the vascular anatomy.  CONTRAST:  153mL OMNIPAQUE IOHEXOL 350 MG/ML SOLN  COMPARISON:  11/07/2017  FINDINGS: Cardiovascular: Heart size normal. No pericardial effusion. Satisfactory opacification of pulmonary arteries noted, and there is no evidence of pulmonary emboli. Common left pulmonary vein trunk to the left atrium. Extensive coronary calcifications. Previous CABG. Good contrast opacification of the thoracic aorta with maximum transverse dimensions as follows:  4.1 cm sinuses of Valsalva  3.4 cm  sino-tubular junction  4.1  Cm proximal ascending (previously 4.2)  3.6 cm distal ascending/proximal arch  3.3 cm distal arch  2.9 cm proximal descending  5.3 cm mid descending (previously 4.8 cm by my measurement)  4.4 cm distal descending  No dissection or stenosis. Classic 3 vessel brachiocephalic arterial origin anatomy. Eccentric nonocclusive mural thrombus in the dilated mid and distal descending segments.  The proximal visualized abdominal aorta is dilated measuring up to 4.2 cm diameter at the level of the left renal artery, not visualized distally.  Mediastinum/Nodes: No hilar or mediastinal adenopathy.  Lungs/Pleura: Trace right pleural effusion as before. No pneumothorax. Right lower lobe bronchiectasis. Subpleural atelectasis/scarring/consolidation laterally and posteriorly at the right lung base as before.  Musculoskeletal: Mild T8 and T9 vertebral body compression deformities, new since previous.  Upper Abdomen: No acute findings.  Review of the MIP images confirms the above findings.  IMPRESSION: 1. Stable 4.2 cm ascending thoracic aortic aneurysm. 2. Enlarging 5.3 cm mid descending thoracic aortic aneurysm (previously 4.8). Recommend semi-annual imaging followup by CTA or MRA and referral to cardiothoracic surgery if not already obtained. This recommendation follows 2010 ACCF/AHA/AATS/ACR/ASA/SCA/SCAI/SIR/STS/SVM Guidelines for the Diagnosis and Management of Patients With Thoracic Aortic Disease. Circulation. 2010; 121: e266-e36 3. Abdominal aortic aneurysm at least 4.4 cm, incompletely visualized. 4. Abdominal aortic aneurysm measuring at least 4.2 cm diameter, incompletely visualized distally. 5. New mild T8 and T9 vertebral body compression deformities.   Electronically Signed   By: Lucrezia Europe M.D.   On: 01/10/2019 16:02 I personally reviewed the CT images and concur with the findings noted above.  I do think the change over  the past year is less dramatic than implied.  I think it was larger in 2019 than the official reading.  Impression: Andrew [name] is an 85 year old man with a past medical history significant for remote tobacco abuse (quit 1986), atherosclerotic cardiovascular disease, descending thoracic aortic aneurysm, abdominal aortic aneurysm, coronary artery disease, status post coronary bypass  grafting x3 in 2010, hypertension, angioedema, arthritis, chronic kidney disease, benign prostatic hypertrophy, and back pain.  He has known ascending aortic ectasia and descending thoracic aortic aneurysm along with an abdominal aortic aneurysm and severe thoracic and abdominal aortic atherosclerosis.  His most recent CT shows stable ascending aortic ectasia.  Given his age this is unlikely to ever cause some difficulty.  Descending thoracic aortic aneurysm and aortic atherosclerosis.  He has a 5.3 cm aneurysm just above the diaphragm.  He is not a candidate for open repair.  He might be a candidate for stent graft although there is no indication for surgery at this time.  Reviewing his CT from 2019 I think the aneurysm was closer to 5 cm so there is been about a 3 mm increase over the past year.  He needs more frequent follow-up and will plan to scan him again in 6 months.  Abdominal aortic aneurysm-4.4 cm on CT scan in February 2019.  He has not had an ultrasound for follow-up.  We will schedule him for an ultrasound to reevaluate that aneurysm.  Then we will plan to follow-up with CT scans along with the chest aneurysm going forward.  Blood pressures well controlled.  Coronary artery disease-history of CABG x3.  No anginal symptoms.  Palpitations-being evaluated by Dr. Debara Pickett  Plan: Abdominal ultrasound to reevaluate abdominal aortic aneurysm. Return in 6 months with CT of chest abdomen and pelvis.  Melrose Nakayama, MD [facility] [phone]

## 2019-02-20 ENCOUNTER — Other Ambulatory Visit: Payer: Self-pay

## 2019-02-20 ENCOUNTER — Ambulatory Visit (INDEPENDENT_AMBULATORY_CARE_PROVIDER_SITE_OTHER): Payer: Medicare Other | Admitting: Physician Assistant

## 2019-02-20 ENCOUNTER — Encounter: Payer: Self-pay | Admitting: Physician Assistant

## 2019-02-20 ENCOUNTER — Other Ambulatory Visit: Payer: Self-pay | Admitting: *Deleted

## 2019-02-20 VITALS — BP 104/67 | HR 60 | Temp 97.2°F | Ht 67.5 in | Wt 151.4 lb

## 2019-02-20 DIAGNOSIS — I719 Aortic aneurysm of unspecified site, without rupture: Secondary | ICD-10-CM

## 2019-02-20 DIAGNOSIS — I10 Essential (primary) hypertension: Secondary | ICD-10-CM | POA: Diagnosis not present

## 2019-02-20 DIAGNOSIS — I493 Ventricular premature depolarization: Secondary | ICD-10-CM | POA: Diagnosis not present

## 2019-02-20 DIAGNOSIS — I712 Thoracic aortic aneurysm, without rupture, unspecified: Secondary | ICD-10-CM

## 2019-02-20 DIAGNOSIS — N183 Chronic kidney disease, stage 3 unspecified: Secondary | ICD-10-CM

## 2019-02-20 DIAGNOSIS — I25810 Atherosclerosis of coronary artery bypass graft(s) without angina pectoris: Secondary | ICD-10-CM

## 2019-02-20 NOTE — Progress Notes (Signed)
Cardiology Office Note    Date:  02/20/2019   ID:  Aum, Andrew [name] 08/14/1931, MRN [id number]  PCP:  Andrew Cruel, MD  Cardiologist:  Andrew [name] (previously Andrew [name])  Chief Complaint  Patient presents with  . Follow-up    seen for Andrew [name], discuss Echo and heart monitor    History of Present Illness:  Andrew [name] is a 85 y.o. male with PMH of CAD s/p CABG 2010, TAA, CKD, and HTN.  Heart monitor obtained in 2017 showed PACs and PVCs.  Although atrial fibrillation is listed as past medical history, however there was no evidence to support this diagnosis.  He is not on no anticoagulation therapy either.  Venous Doppler for lower extremity edema obtained on 11/14/2018 was negative.  CT angiogram of the chest obtained in June 2020 showed stable 4.2 cm ascending thoracic aorta, enlarging 5.3 cm mid descending thoracic aortic aneurysm.  He was referred to CT surgery for further work-up.  He was last seen by Andrew [name] on 01/15/2019, EKG at the time showed ventricular bigeminy.  He was not symptomatic at the time.  Echocardiogram obtained on 01/27/2019 showed EF of 60 to 65%, normal RV EF, mild LAE, mild to moderate TR otherwise no significant aortic or mitral valve issue.  2-week heart monitor demonstrated sinus rhythm with frequent PVCs.  Overall PVC burden was 11.5%.  There were also periods of bigeminy as well.  He has been seen by cardiothoracic surgeon Dr. Roxan Hockey who recommended repeat imaging in 6 months.  Patient presents back to the cardiology clinic today for reevaluation.  He denies any recent chest pain, shortness of breath or dizziness.  His PVC burden is about 11.5%.  Given the lack of symptom and his borderline blood pressure, I did not try to uptitrated his diltiazem.  I will continue him on the current medication.  He can follow-up with Andrew [name] in 4 to 5 months.    Past Medical History:  Diagnosis Date  . Angioedema 06/01/2015  . Arthritis   . Back pain   .  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 Chronic kidney disease    ? bph, pt self catheterizes himself on schedule  . Complication of anesthesia    had to have Foley post op cabg and lumb lam  . Coronary artery disease   . Hypertension   . Thoracic aortic aneurysm ([name])    09/29/16 CT: 4.3 cm ascending, 4.5 cm descending. 1 yr f/u rec  . Urinary retention   . Wears glasses     Past Surgical History:  Procedure Laterality Date  . BACK SURGERY  2012   lumb lam  . CARDIAC CATHETERIZATION     2010-per patient  . COLONOSCOPY    . CORONARY ARTERY BYPASS GRAFT     2010  . EPIGASTRIC HERNIA REPAIR N/A 09/11/2017   Procedure: HERNIA REPAIR EPIGASTRIC ADULT;  Surgeon: Andrew Bookbinder, MD;  Location: [name];  Service: General;  Laterality: N/A;  . HERNIA REPAIR     rih 1981  . HERNIA REPAIR  09/11/2017   with mesh  . INGUINAL HERNIA REPAIR  07/30/2012   Procedure: HERNIA REPAIR INGUINAL ADULT;  Surgeon: Andrew Bookbinder, MD;  Location: [name];  Service: General;  Laterality: Left;  Left Hernia Site  . INSERTION OF MESH  07/30/2012   Procedure: INSERTION OF MESH;  Surgeon: Andrew Bookbinder, MD;  Location:  [name];  Service: General;  Laterality: Left;  Left Inguinal Hernia  . INSERTION OF MESH N/A 09/11/2017   Procedure: INSERTION OF MESH;  Surgeon: Andrew Bookbinder, MD;  Location: [name];  Service: General;  Laterality: N/A;  . LAPAROSCOPY N/A 09/11/2017   Procedure: DIAGNOSTIC LAPAOSCOPY;  Surgeon: Andrew Bookbinder, MD;  Location: [name];  Service: General;  Laterality: N/A;  ERAS pathway  . SPINE SURGERY     lumbar laminectomy  . TONSILLECTOMY      Current Medications: Outpatient Medications Prior to Visit  Medication Sig Dispense Refill  . aspirin 81 MG tablet Take 81 mg by mouth daily.    Andrew [name] atorvastatin (LIPITOR) 20 MG tablet TAKE 1 TABLET ONCE DAILY AT 6 PM. 90 tablet 2  . Cholecalciferol (VITAMIN D3)  1000 units CAPS Take 1,000 Units by mouth daily.    Andrew [name] Co-Enzyme Q-10 100 MG CAPS Take 100 mg by mouth daily.     Andrew [name] DILT-XR 180 MG 24 hr capsule TAKE 1 CAPSULE DAILY. 30 capsule 7  . ferrous sulfate 325 (65 FE) MG tablet Take 325 mg by mouth daily with breakfast.    . gabapentin (NEURONTIN) 300 MG capsule Take 300 mg by mouth at bedtime.     . Multiple Vitamins-Minerals (PRESERVISION AREDS 2 PO) Take 1 capsule by mouth 2 (two) times daily.    . nitroGLYCERIN (NITROSTAT) 0.6 MG SL tablet Place 0.6 mg under the tongue every 5 (five) minutes as needed for chest pain.    Andrew [name] oxymetazoline (AFRIN) 0.05 % nasal spray Place 1 spray into both nostrils at bedtime as needed for congestion.    . sodium chloride (MURO 128) 5 % ophthalmic ointment Place 1 application into both eyes at bedtime.    . triamcinolone (NASACORT ALLERGY 24HR) 55 MCG/ACT AERO nasal inhaler Place 2 sprays into the nose daily as needed (for allergies).    . vitamin B-12 (CYANOCOBALAMIN) 1000 MCG tablet Take 1,000 mcg by mouth daily.     . vitamin C (ASCORBIC ACID) 250 MG tablet Take 250 mg by mouth daily.     Andrew [name] Oil 500 MG CAPS Take 500 mg by mouth daily.    . nitroGLYCERIN (NITRODUR - DOSED IN MG/24 HR) 0.2 mg/hr patch Use 1/2 patch daily to the affected area (Patient not taking: Reported on 02/20/2019) 30 patch 1   No facility-administered medications prior to visit.      Allergies:   Patient has no known allergies.   Social History   Socioeconomic History  . Marital status: Married    Spouse name: Not on file  . Number of children: Not on file  . Years of education: Not on file  . Highest education level: Not on file  Occupational History  . Not on file  Social Needs  . Financial resource strain: Not on file  . Food insecurity    Worry: Not on file    Inability: Not on file  . Transportation needs    Medical: Not on file    Non-medical: Not on file  Tobacco Use  . Smoking status: Former Smoker    Packs/day: 1.00     Years: 8.00    Pack years: 8.00    Quit date: 07/24/1984    Years since quitting: 34.6  . Smokeless tobacco: Never Used  Substance and Sexual Activity  . Alcohol use: No  . Drug use: No  . Sexual activity: Not on file  Lifestyle  . Physical activity  Days per week: Not on file    Minutes per session: Not on file  . Stress: Not on file  Relationships  . Social Herbalist on phone: Not on file    Gets together: Not on file    Attends religious service: Not on file    Active member of club or organization: Not on file    Attends meetings of clubs or organizations: Not on file    Relationship status: Not on file  Other Topics Concern  . Not on file  Social History Narrative  . Not on file     Family History:  The patient's family history includes Allergic rhinitis in his son; Coronary artery disease in his mother.   ROS:   Please see the history of present illness.    ROS All other systems reviewed and are negative.   PHYSICAL EXAM:   VS:  BP 104/67   Pulse 60   Temp (!) 97.2 F (36.2 C)   Ht 5' 7.5" (1.715 m)   Wt 151 lb 6.4 oz (68.7 kg)   BMI 23.36 kg/m    GEN: Well nourished, well developed, in no acute distress  HEENT: normal  Neck: no JVD, carotid bruits, or masses Cardiac: RRR; no murmurs, rubs, or gallops,no edema  Respiratory:  clear to auscultation bilaterally, normal work of breathing GI: soft, nontender, nondistended, + BS MS: no deformity or atrophy  Skin: warm and dry, no rash Neuro:  Alert and Oriented x 3, Strength and sensation are intact Psych: euthymic mood, full affect  Wt Readings from Last 3 Encounters:  02/20/19 151 lb 6.4 oz (68.7 kg)  02/18/19 151 lb (68.5 kg)  01/15/19 150 lb 9.6 oz (68.3 kg)      Studies/Labs Reviewed:   EKG:  EKG is not ordered today.    Recent Labs: 09/20/2018: ALT 14 09/21/2018: Hemoglobin 11.2; Platelets 149 01/16/2019: BUN 19; Creatinine, Ser 1.23; Potassium 4.9; Sodium 139   Lipid Panel No  results found for: CHOL, TRIG, HDL, CHOLHDL, VLDL, LDLCALC, LDLDIRECT  Additional studies/ records that were reviewed today include:   Echo 01/27/2019 1. The left ventricle has normal systolic function with an ejection fraction of 60-65%. The cavity size was normal. Left ventricular diastolic Doppler parameters are consistent with impaired relaxation. Elevated left ventricular end-diastolic pressure  The E/e' is 18.  2. The right ventricle has normal systolic function. The cavity was mildly enlarged. There is no increase in right ventricular wall thickness. Right ventricular systolic pressure 36 mmHg.  3. Left atrial size was mildly dilated.  4. The aortic valve is grossly normal. No stenosis of the aortic valve.  5. Tricuspid valve regurgitation is mild-moderate.  6. The inferior vena cava was normal in size with <50% respiratory variability.    2 week heart Monitor 02/17/2019 Sinus rhythm with frequent PVC's (11.5%) and periods of bigeminy.    ASSESSMENT:    1. PVC (premature ventricular contraction)   2. Coronary artery disease involving coronary bypass graft of native heart without angina pectoris   3. Thoracic aortic aneurysm without rupture ([place])   4. Essential hypertension   5. CKD (chronic kidney disease), stage III ([place])      PLAN:  In order of problems listed above:  1. Frequent PVCs: Recent heart monitor demonstrating 11.5% of PVC burden.  He also has occasional bigeminy as well.  He denies any symptoms of chest pain, dizziness, blurred vision or feeling of passing out.  Given lack of  symptom, I did not titrate his diltiazem.  He does not need antiarrhythmic therapy at this time as the degree of PVC has not generated any cardiomyopathy.  2. CAD s/p CABG: Denies any exertional chest pain or shortness of breath recently.  Continue aspirin and statin  3. Thoracic aortic aneurysm: Seen by Dr. Roxan Hockey recently.  Plan to repeat image in 6 months  4. Hypertension: Blood  pressure borderline today, however normally his blood pressure is normal  5. CKD stage III: Recent lab work in June 2020 showed stable renal function.    Medication Adjustments/Labs and Tests Ordered: Current medicines are reviewed at length with the patient today.  Concerns regarding medicines are outlined above.  Medication changes, Labs and Tests ordered today are listed in the Patient Instructions below. Patient Instructions  Medication Instructions:  Your physician recommends that you continue on your current medications as directed. Please refer to the Current Medication list given to you today.  If you need a refill on your cardiac medications before your next appointment, please call your pharmacy.   Lab work: NONE ordered at this time of appointment  If you have labs (blood work) drawn today and your tests are completely normal, you will receive your results only by: [name] MyChart Message (if you have MyChart) OR . A paper copy in the mail If you have any lab test that is abnormal or we need to change your treatment, we will call you to review the results.  Testing/Procedures: NONE ordered at this time of appointment   Follow-Up: At [facility], you and your health needs are our priority.  As part of our continuing mission to provide you with exceptional heart care, we have created designated Provider Care Teams.  These Care Teams include your primary Cardiologist (physician) and Advanced Practice Providers (APPs -  Physician Assistants and Nurse Practitioners) who all work together to provide you with the care you need, when you need it. You will need a follow up appointment in 4-5 months.  Please call our office 2 months in advance to schedule this appointment.  You may see Pixie Casino, MD or one of the following Advanced Practice Providers on your designated Care Team: [place], [place] . Fabian Sharp, PA-C  Any Other Special Instructions Will Be Listed Below (If Applicable).        Hilbert Corrigan, [place]  02/20/2019 9:33 AM    [address], [place], [place]  [address] Phone: [phone]; Fax: [phone]

## 2019-02-20 NOTE — Patient Instructions (Signed)
Medication Instructions:  Your physician recommends that you continue on your current medications as directed. Please refer to the Current Medication list given to you today.  If you need a refill on your cardiac medications before your next appointment, please call your pharmacy.   Lab work: NONE ordered at this time of appointment  If you have labs (blood work) drawn today and your tests are completely normal, you will receive your results only by: Marland Kitchen MyChart Message (if you have MyChart) OR . A paper copy in the mail If you have any lab test that is abnormal or we need to change your treatment, we will call you to review the results.  Testing/Procedures: NONE ordered at this time of appointment   Follow-Up: At [facility], you and your health needs are our priority.  As part of our continuing mission to provide you with exceptional heart care, we have created designated Provider Care Teams.  These Care Teams include your primary Cardiologist (physician) and Advanced Practice Providers (APPs -  Physician Assistants and Nurse Practitioners) who all work together to provide you with the care you need, when you need it. You will need a follow up appointment in 4-5 months.  Please call our office 2 months in advance to schedule this appointment.  You may see Andrew Casino, Andrew [name] or one of the following Advanced Practice Providers on your designated Care Team: [place], [place] . Fabian Sharp, PA-C  Any Other Special Instructions Will Be Listed Below (If Applicable).

## 2019-02-26 ENCOUNTER — Other Ambulatory Visit: Payer: Medicare Other

## 2019-02-26 ENCOUNTER — Ambulatory Visit
Admission: RE | Admit: 2019-02-26 | Discharge: 2019-02-26 | Disposition: A | Discharge status: Home / Self Care | Payer: Medicare Other | Source: Ambulatory Visit | Attending: Thoracic Surgery (Cardiothoracic Vascular Surgery) | Admitting: Thoracic Surgery (Cardiothoracic Vascular Surgery)

## 2019-02-26 DIAGNOSIS — I719 Aortic aneurysm of unspecified site, without rupture: Secondary | ICD-10-CM

## 2019-03-04 ENCOUNTER — Telehealth: Payer: Self-pay | Admitting: Thoracic Surgery (Cardiothoracic Vascular Surgery)

## 2019-03-04 NOTE — Telephone Encounter (Signed)
      [address]       [place] [address]             [phone]      I called Mr. Schoenberger to update him on the results of his abdominal ultrasound.  It showed maximum 4.2 x 4.8 cm infrarenal abdominal aneurysm.  This likely will not require any intervention at this time but will need to be followed.  Will refer to vascular surgery.  Revonda Standard Roxan Hockey, MD [facility] [phone]

## 2019-03-26 ENCOUNTER — Other Ambulatory Visit: Payer: Self-pay

## 2019-03-26 ENCOUNTER — Ambulatory Visit (INDEPENDENT_AMBULATORY_CARE_PROVIDER_SITE_OTHER): Payer: Medicare Other | Admitting: Family Medicine

## 2019-03-26 ENCOUNTER — Encounter: Payer: Self-pay | Admitting: Family Medicine

## 2019-03-26 VITALS — BP 126/78 | Wt 146.0 lb

## 2019-03-26 DIAGNOSIS — M25552 Pain in left hip: Secondary | ICD-10-CM

## 2019-03-26 DIAGNOSIS — G8929 Other chronic pain: Secondary | ICD-10-CM | POA: Diagnosis not present

## 2019-03-26 NOTE — Progress Notes (Signed)
PCP: [name], MD  Subjective:   HPI: Patient is a 85 y.o. male here for follow-up of left hip pain.  Pain is located in his left buttock and has some radiation down his leg.  Patient notes this pain is been present for the last 10 to 15 years.  His last visit he had a steroid injection at his piriformis tendon.  He notes this did not help improve any of his symptoms.  He previously has done both home and formal physical therapy, he has used nitro patches, he has had bursectomy, multiple spinal fusions, been to a chiropractor and none of these therapies have improved his pain.  Patient notes he no longer sits with a wallet in his back pocket.  He does endorse intermittent swelling of his left leg.  Review of Systems: See HPI above.  Past Medical History:  Diagnosis Date  . Angioedema 06/01/2015  . Arthritis   . Back pain   . 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 Chronic kidney disease    ? bph, pt self catheterizes himself on schedule  . Complication of anesthesia    had to have Foley post op cabg and lumb lam  . Coronary artery disease   . Hypertension   . Thoracic aortic aneurysm ([place])    09/29/16 CT: 4.3 cm ascending, 4.5 cm descending. 1 yr f/u rec  . Urinary retention   . Wears glasses     Current Outpatient Medications on File Prior to Visit  Medication Sig Dispense Refill  . aspirin 81 MG tablet Take 81 mg by mouth daily.    Marland Kitchen atorvastatin (LIPITOR) 20 MG tablet TAKE 1 TABLET ONCE DAILY AT 6 PM. 90 tablet 2  . Cholecalciferol (VITAMIN D3) 1000 units CAPS Take 1,000 Units by mouth daily.    Marland Kitchen Co-Enzyme Q-10 100 MG CAPS Take 100 mg by mouth daily.     Marland Kitchen DILT-XR 180 MG 24 hr capsule TAKE 1 CAPSULE DAILY. 30 capsule 7  . ferrous sulfate 325 (65 FE) MG tablet Take 325 mg by mouth daily with breakfast.    . gabapentin (NEURONTIN) 300 MG capsule Take 300 mg by mouth at bedtime.     Javier Docker Oil 500 MG CAPS Take 500 mg by mouth  daily.    . Multiple Vitamins-Minerals (PRESERVISION AREDS 2 PO) Take 1 capsule by mouth 2 (two) times daily.    . nitroGLYCERIN (NITRODUR - DOSED IN MG/24 HR) 0.2 mg/hr patch Use 1/2 patch daily to the affected area (Patient not taking: Reported on 02/20/2019) 30 patch 1  . nitroGLYCERIN (NITROSTAT) 0.6 MG SL tablet Place 0.6 mg under the tongue every 5 (five) minutes as needed for chest pain.    Marland Kitchen oxymetazoline (AFRIN) 0.05 % nasal spray Place 1 spray into both nostrils at bedtime as needed for congestion.    . sodium chloride (MURO 128) 5 % ophthalmic ointment Place 1 application into both eyes at bedtime.    . triamcinolone (NASACORT ALLERGY 24HR) 55 MCG/ACT AERO nasal inhaler Place 2 sprays into the nose daily as needed (for allergies).    . vitamin B-12 (CYANOCOBALAMIN) 1000 MCG tablet Take 1,000 mcg by mouth daily.     . vitamin C (ASCORBIC ACID) 250 MG tablet Take 250 mg by mouth daily.      No current facility-administered medications on file prior to visit.     Past Surgical History:  Procedure Laterality Date  .  BACK SURGERY  2012   lumb lam  . CARDIAC CATHETERIZATION     2010-per patient  . COLONOSCOPY    . CORONARY ARTERY BYPASS GRAFT     2010  . EPIGASTRIC HERNIA REPAIR N/A 09/11/2017   Procedure: HERNIA REPAIR EPIGASTRIC ADULT;  Surgeon: Rolm Bookbinder, MD;  Location: [place];  Service: General;  Laterality: N/A;  . HERNIA REPAIR     rih 1981  . HERNIA REPAIR  09/11/2017   with mesh  . INGUINAL HERNIA REPAIR  07/30/2012   Procedure: HERNIA REPAIR INGUINAL ADULT;  Surgeon: Rolm Bookbinder, MD;  Location: [place];  Service: General;  Laterality: Left;  Left Hernia Site  . INSERTION OF MESH  07/30/2012   Procedure: INSERTION OF MESH;  Surgeon: Rolm Bookbinder, MD;  Location: [employer];  Service: General;  Laterality: Left;  Left Inguinal Hernia  . INSERTION OF MESH N/A 09/11/2017   Procedure: INSERTION OF MESH;  Surgeon: Rolm Bookbinder, MD;  Location: [place];  Service: General;  Laterality: N/A;  . LAPAROSCOPY N/A 09/11/2017   Procedure: DIAGNOSTIC LAPAOSCOPY;  Surgeon: Rolm Bookbinder, MD;  Location: [place];  Service: General;  Laterality: N/A;  ERAS pathway  . SPINE SURGERY     lumbar laminectomy  . TONSILLECTOMY      No Known Allergies  Social History   Socioeconomic History  . Marital status: Married    Spouse name: Not on file  . Number of children: Not on file  . Years of education: Not on file  . Highest education level: Not on file  Occupational History  . Not on file  Social Needs  . Financial resource strain: Not on file  . Food insecurity    Worry: Not on file    Inability: Not on file  . Transportation needs    Medical: Not on file    Non-medical: Not on file  Tobacco Use  . Smoking status: Former Smoker    Packs/day: 1.00    Years: 8.00    Pack years: 8.00    Quit date: 07/24/1984    Years since quitting: 34.6  . Smokeless tobacco: Never Used  Substance and Sexual Activity  . Alcohol use: No  . Drug use: No  . Sexual activity: Not on file  Lifestyle  . Physical activity    Days per week: Not on file    Minutes per session: Not on file  . Stress: Not on file  Relationships  . Social Herbalist on phone: Not on file    Gets together: Not on file    Attends religious service: Not on file    Active member of club or organization: Not on file    Attends meetings of clubs or organizations: Not on file    Relationship status: Not on file  . Intimate partner violence    Fear of current or ex partner: Not on file    Emotionally abused: Not on file    Physically abused: Not on file    Forced sexual activity: Not on file  Other Topics Concern  . Not on file  Social History Narrative  . Not on file    Family History  Problem Relation Age of Onset  . Allergic rhinitis Son   . Coronary artery disease Mother   . Angioedema Neg Hx   . Asthma Neg Hx   . Atopy Neg Hx    . Eczema Neg Hx   .  Immunodeficiency Neg Hx   . Urticaria Neg Hx        Objective:  Physical Exam: BP 126/78   Wt 146 lb (66.2 kg)   BMI 22.53 kg/m  Gen: NAD, comfortable in exam room Lungs: Breathing comfortably on room air  Hip Exam Left -Inspection: No deformity, no discoloration -Palpation: Tenderness palpation over left greater trochanter, left ischial tuberosity, left piriformis -ROM: Flexion: 125 degrees; Extension: 115 degrees; Abduction: 45 degrees; Internal Rotation: 40 degrees; 45 degrees-Strength: Flexion: 5/5; Extension 5/5; Abduction: 5/5 -Special Tests: Obers: Negative; FABER: Negative; FADIR: Negative; Log roll: Negative -Negative straight leg -Negative slump test - Patient had mild pain with stretching of his piriformis -Positive Trendelenburg on left side -Limb neurovascularly intact  Contralateral Hip -Inspection: No deformity, no discoloration -Palpation: SI joint: non-tender; greater trochanter: non-tender -ROM: Flexion: 125 degrees; Extension: 115 degrees; Abduction: 45 degrees; Internal Rotation: 40 degrees; 45 degrees -Strength: Flexion: 5/5; Extension 5/5; Abduction: 5/5 -Limb neurovascularly intact   Assessment & Plan:  Patient is a 85 y.o. male here for follow-up of left hip pain  1.  Chronic left hip pain - Patient with chronic left gluteal pain for the last 10 years.  Patient has failed multiple treatment options as listed in the HPI - Exam today largely unchanged from patient's previous exam. - Lengthy discussion was had with patient regarding his pain.  Patient will likely always have some component of pain however goals will be to limit the amount of pain he is having and to keep him as active as possible - Further injections are unlikely to help patient's pain - Patient is unable to tolerate gabapentin or Lyrica has not benefited from Cymbalta in the past - Patient may try alternative therapies as wanted for pain including light therapeutic  massage or acupuncture.  He will also continue with strengthening exercises given to him at previous visit - She may take Tylenol as needed for his discomfort  Patient will follow-up on an as-needed basis

## 2019-04-29 ENCOUNTER — Encounter: Payer: Self-pay | Admitting: Vascular Surgery

## 2019-04-29 ENCOUNTER — Other Ambulatory Visit: Payer: Self-pay

## 2019-04-29 ENCOUNTER — Ambulatory Visit: Payer: Medicare Other | Admitting: Vascular Surgery

## 2019-04-29 DIAGNOSIS — I714 Abdominal aortic aneurysm, without rupture, unspecified: Secondary | ICD-10-CM | POA: Insufficient documentation

## 2019-04-29 NOTE — Progress Notes (Signed)
Patient name: Andrew [name] MRN: [id number] DOB: 01/14/1932 Sex: male  REASON FOR CONSULT: Abdominal aortic aneurysm  HPI: Andrew [name] is a 85 y.o. male, with history of hypertension, coronary artery disease status post CABG, remote tobacco abuse (quit 1986), known thoracic aneurysm followed by CT surgery, and CKD who presents for evaluation of an abdominal aortic aneurysm.  He is referred by Dr. Roxan Hockey with CT surgery after an ultrasound of the aorta showed a 4.8 cm infrarenal abdominal aortic aneurysm.  Patient states he has known about his thoracic aneurysm but this is new.  He reports no new abdominal or back pain.  He does not smoke at this time but did remotely.  [facility] do work on [facility] exercise [facility].  States he lives at home with his wife.  No previous history of abdominal surgery.  As a relates to his thoracic aneurysm his last CT chest on 01/10/2019 showed a stable 4.2 cm ascending aortic aneurysm and an enlarging 5.3 cm mid descending thoracic aneurysm.  Past Medical History:  Diagnosis Date  . Angioedema 06/01/2015  . Arthritis   . Back pain   . 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 Chronic kidney disease    ? bph, pt self catheterizes himself on schedule  . Complication of anesthesia    had to have Foley post op cabg and lumb lam  . Coronary artery disease   . Hypertension   . Thoracic aortic aneurysm ([place])    09/29/16 CT: 4.3 cm ascending, 4.5 cm descending. 1 yr f/u rec  . Urinary retention   . Wears glasses     Past Surgical History:  Procedure Laterality Date  . BACK SURGERY  2012   lumb lam  . CARDIAC CATHETERIZATION     2010-per patient  . COLONOSCOPY    . CORONARY ARTERY BYPASS GRAFT     2010  . EPIGASTRIC HERNIA REPAIR N/A 09/11/2017   Procedure: HERNIA REPAIR EPIGASTRIC ADULT;  Surgeon: Rolm Bookbinder, MD;  Location: [place];  Service: General;   Laterality: N/A;  . HERNIA REPAIR     rih 1981  . HERNIA REPAIR  09/11/2017   with mesh  . INGUINAL HERNIA REPAIR  07/30/2012   Procedure: HERNIA REPAIR INGUINAL ADULT;  Surgeon: Rolm Bookbinder, MD;  Location: [place];  Service: General;  Laterality: Left;  Left Hernia Site  . INSERTION OF MESH  07/30/2012   Procedure: INSERTION OF MESH;  Surgeon: Rolm Bookbinder, MD;  Location: [place];  Service: General;  Laterality: Left;  Left Inguinal Hernia  . INSERTION OF MESH N/A 09/11/2017   Procedure: INSERTION OF MESH;  Surgeon: Rolm Bookbinder, MD;  Location: [place];  Service: General;  Laterality: N/A;  . LAPAROSCOPY N/A 09/11/2017   Procedure: DIAGNOSTIC LAPAOSCOPY;  Surgeon: Rolm Bookbinder, MD;  Location: [place];  Service: General;  Laterality: N/A;  ERAS pathway  . SPINE SURGERY     lumbar laminectomy  . TONSILLECTOMY      Family History  Problem Relation Age of Onset  . Allergic rhinitis Son   . Coronary artery disease Mother   . Angioedema Neg Hx   . Asthma Neg Hx   . Atopy Neg Hx   . Eczema Neg Hx   . Immunodeficiency Neg Hx   . Urticaria Neg Hx  SOCIAL HISTORY: Social History   Socioeconomic History  . Marital status: Married    Spouse name: Not on file  . Number of children: Not on file  . Years of education: Not on file  . Highest education level: Not on file  Occupational History  . Not on file  Social Needs  . Financial resource strain: Not on file  . Food insecurity    Worry: Not on file    Inability: Not on file  . Transportation needs    Medical: Not on file    Non-medical: Not on file  Tobacco Use  . Smoking status: Former Smoker    Packs/day: 1.00    Years: 8.00    Pack years: 8.00    Quit date: 07/24/1984    Years since quitting: 34.7  . Smokeless tobacco: Never Used  Substance and Sexual Activity  . Alcohol use: No  . Drug use: No  . Sexual activity: Not on file  Lifestyle  . Physical activity     Days per week: Not on file    Minutes per session: Not on file  . Stress: Not on file  Relationships  . Social Herbalist on phone: Not on file    Gets together: Not on file    Attends religious service: Not on file    Active member of club or organization: Not on file    Attends meetings of clubs or organizations: Not on file    Relationship status: Not on file  . Intimate partner violence    Fear of current or ex partner: Not on file    Emotionally abused: Not on file    Physically abused: Not on file    Forced sexual activity: Not on file  Other Topics Concern  . Not on file  Social History Narrative  . Not on file    No Known Allergies  Current Outpatient Medications  Medication Sig Dispense Refill  . aspirin 81 MG tablet Take 81 mg by mouth daily.    Marland Kitchen atorvastatin (LIPITOR) 20 MG tablet TAKE 1 TABLET ONCE DAILY AT 6 PM. 90 tablet 2  . Cholecalciferol (VITAMIN D3) 1000 units CAPS Take 1,000 Units by mouth daily.    Marland Kitchen Co-Enzyme Q-10 100 MG CAPS Take 100 mg by mouth daily.     Marland Kitchen DILT-XR 180 MG 24 hr capsule TAKE 1 CAPSULE DAILY. 30 capsule 7  . ferrous sulfate 325 (65 FE) MG tablet Take 325 mg by mouth daily with breakfast.    . gabapentin (NEURONTIN) 300 MG capsule Take 300 mg by mouth at bedtime.     . Multiple Vitamins-Minerals (PRESERVISION AREDS 2 PO) Take 1 capsule by mouth 2 (two) times daily.    . nitroGLYCERIN (NITRODUR - DOSED IN MG/24 HR) 0.2 mg/hr patch Use 1/2 patch daily to the affected area 30 patch 1  . nitroGLYCERIN (NITROSTAT) 0.6 MG SL tablet Place 0.6 mg under the tongue every 5 (five) minutes as needed for chest pain.    Marland Kitchen oxymetazoline (AFRIN) 0.05 % nasal spray Place 1 spray into both nostrils at bedtime as needed for congestion.    . sodium chloride (MURO 128) 5 % ophthalmic ointment Place 1 application into both eyes at bedtime.    . triamcinolone (NASACORT ALLERGY 24HR) 55 MCG/ACT AERO nasal inhaler Place 2 sprays into the nose daily as  needed (for allergies).    . vitamin B-12 (CYANOCOBALAMIN) 1000 MCG tablet Take 1,000 mcg by mouth daily.     Marland Kitchen  vitamin C (ASCORBIC ACID) 250 MG tablet Take 250 mg by mouth daily.      No current facility-administered medications for this visit.     REVIEW OF SYSTEMS:  [X]  denotes positive finding, [ ]  denotes negative finding Cardiac  Comments:  Chest pain or chest pressure:    Shortness of breath upon exertion:    Short of breath when lying flat:    Irregular heart rhythm:        Vascular    Pain in calf, thigh, or hip brought on by ambulation:    Pain in feet at night that wakes you up from your sleep:     Blood clot in your veins:    Leg swelling:  x       Pulmonary    Oxygen at home:    Productive cough:     Wheezing:         Neurologic    Sudden weakness in arms or legs:     Sudden numbness in arms or legs:     Sudden onset of difficulty speaking or slurred speech:    Temporary loss of vision in one eye:     Problems with dizziness:  x       Gastrointestinal    Blood in stool:     Vomited blood:         Genitourinary    Burning when urinating:     Blood in urine:        Psychiatric    Major depression:         Hematologic    Bleeding problems:    Problems with blood clotting too easily:        Skin    Rashes or ulcers:        Constitutional    Fever or chills:      PHYSICAL EXAM: Vitals:   04/29/19 1225  BP: (!) 143/80  Pulse: 71  Resp: 14  Temp: 97.8 F (36.6 C)  TempSrc: Temporal  SpO2: 96%  Weight: 152 lb (68.9 kg)  Height: 5\' 8"  (1.727 m)    GENERAL: The patient is a well-nourished male, in no acute distress. The vital signs are documented above. CARDIAC: There is a regular rate and rhythm.  VASCULAR:  Palpable femoral pulses bilaterally Palpable dorsalis pedis pulses bilaterally PULMONARY: There is good air exchange bilaterally without wheezing or rales. ABDOMEN: Soft and non-tender with normal pitched bowel sounds.   MUSCULOSKELETAL: There are no major deformities or cyanosis. NEUROLOGIC: No focal weakness or paresthesias are detected.   DATA:   Ultrasound of his aorta shows a 4.8 cm infrarenal abdominal aortic aneurysm.  Right common iliac artery measures 1.7 cm and the left common iliac artery measures 2.1 cm.  Assessment/Plan:  85 year old male presents with 4.8 cm infrarenal abdominal aortic aneurysm based on ultrasound.  Discussed with him in detail that no indication for repair at this time.  Would recommend surveillance with repeat ultrasound in 6 months.  Discussed that typically offer repair at 5.5 cm in men.  He asked about a stent graft today and discussed that we would need to get better axial imaging with a CT in the future if he ever needs repair to make that determination.  I will see him again in six months with abdominal US.   Marty Heck, MD [facility] Office: [phone] Pager: [phone]

## 2019-06-23 ENCOUNTER — Encounter: Payer: Self-pay | Admitting: Internal Medicine

## 2019-06-23 ENCOUNTER — Other Ambulatory Visit: Payer: Self-pay

## 2019-06-23 ENCOUNTER — Ambulatory Visit: Payer: Medicare Other | Admitting: Internal Medicine

## 2019-06-23 VITALS — BP 112/76 | HR 57 | Temp 96.0°F | Ht 68.0 in | Wt 150.0 lb

## 2019-06-23 DIAGNOSIS — I25810 Atherosclerosis of coronary artery bypass graft(s) without angina pectoris: Secondary | ICD-10-CM | POA: Diagnosis not present

## 2019-06-23 DIAGNOSIS — I712 Thoracic aortic aneurysm, without rupture, unspecified: Secondary | ICD-10-CM

## 2019-06-23 DIAGNOSIS — I493 Ventricular premature depolarization: Secondary | ICD-10-CM | POA: Diagnosis not present

## 2019-06-23 DIAGNOSIS — Z951 Presence of aortocoronary bypass graft: Secondary | ICD-10-CM

## 2019-06-23 NOTE — Patient Instructions (Addendum)
Medication Instructions:  Your physician recommends that you continue on your current medications as directed. Please refer to the Current Medication list given to you today.  *If you need a refill on your cardiac medications before your next appointment, please call your pharmacy*  Lab Work: NONE If you have labs (blood work) drawn today and your tests are completely normal, you will receive your results only by: . MyChart Message (if you have MyChart) OR . A paper copy in the mail If you have any lab test that is abnormal or we need to change your treatment, we will call you to review the results.  Testing/Procedures: NONE  Follow-Up: At [facility], you and your health needs are our priority.  As part of our continuing mission to provide you with exceptional heart care, we have created designated Provider Care Teams.  These Care Teams include your primary Cardiologist (physician) and Advanced Practice Providers (APPs -  Physician Assistants and Nurse Practitioners) who all work together to provide you with the care you need, when you need it.  Your next appointment:   6 month(s)  The format for your next appointment:   In Person  Provider:   You may see Kenneth C Hilty, MD or one of the following Advanced Practice Providers on your designated Care Team:    Hao Meng, PA-C  Angela Duke, PA-C or   Krista Kroeger, PA-C   Other Instructions   

## 2019-06-23 NOTE — Progress Notes (Signed)
OFFICE NOTE  Chief Complaint:  Follow-up  Primary Care Physician: [name], MD  HPI:  Andrew [name] is a 85 y.o. male with a past medial history significant for coronary artery disease status post CABG in 2010 with prior history of angioplasty and PCI dating back at least 10 years before that.  He also has history of thoracic aortic aneurysm with a 4.3 cm ascending and 4.5 cm descending aneurysm.  Chronic kidney disease, hypertension, arthritis, skin cancer, and ongoing back problem which she is having evaluated for injection.  There is a listed a history of paroxysmal atrial fibrillation however I cannot find evidence to support this.  He is worn a monitor, last in 2017 which showed PACs and PVCs.  There is no A. fib today.  He does have PVCs.  He is not anticoagulated beyond aspirin.  Testing from his PCP showed total cholesterol 150, HDL 53, LDL 83 and triglycerides 74.  01/15/2019  Andrew [name] returns today for follow-up.  He was previously followed by Dr. Wynonia Lawman and establish care with me in March 2020 prior to the [place] pandemic.  He underwent scheduled repeat CT angiography for aortic aneurysm.  This demonstrated stable 4.2 cm ascending thoracic aortic aneurysm, however did also demonstrate an enlarging 5.3 cm mid descending thoracic aortic aneurysm (previously measuring 4.8 cm).  Referral to cardiothoracic surgery was recommended.  Additionally, there were incidentally noted mild T8 and T9 vertebral body compression deformities.  Symptomatically he is doing fairly well.  He complains of some left knee pain left lower leg swelling.  Today he is noted to be in ventricular bigeminy, on EKG.  He is noted recently having more frequent palpitations.  He says he has a longstanding history of palpitations and has been monitored for that several times in the past.  06/23/2019  Andrew [name] is seen today for follow-up.  He had been seen in the interim by Dr. Roxan Hockey with who will  follow up his thoracic aortic aneurysm.  In addition he was referred to Dr. Carlis Abbott with vascular surgery to follow-up an abdominal aortic aneurysm.  From a cardiac standpoint he is doing well.  When I last saw him he was having frequent ectopy and wore a monitor which showed PVCs at a frequency of close to 12%.  An echo was performed which showed normal systolic function.  He said this went on for a while about 2 months and then stopped pretty abruptly.  Since that time he is not had any further palpitations and his EKG today shows no evidence of any PVCs.  PMHx:  Past Medical History:  Diagnosis Date  . Angioedema 06/01/2015  . Arthritis   . Back pain   . 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 Chronic kidney disease    ? bph, pt self catheterizes himself on schedule  . Complication of anesthesia    had to have Foley post op cabg and lumb lam  . Coronary artery disease   . Hypertension   . Thoracic aortic aneurysm ([place])    09/29/16 CT: 4.3 cm ascending, 4.5 cm descending. 1 yr f/u rec  . Urinary retention   . Wears glasses     Past Surgical History:  Procedure Laterality Date  . BACK SURGERY  2012   lumb lam  . CARDIAC CATHETERIZATION     2010-per patient  . COLONOSCOPY    . CORONARY ARTERY BYPASS GRAFT  2010  . EPIGASTRIC HERNIA REPAIR N/A 09/11/2017   Procedure: HERNIA REPAIR EPIGASTRIC ADULT;  Surgeon: Rolm Bookbinder, MD;  Location: [facility];  Service: General;  Laterality: N/A;  . HERNIA REPAIR     rih 1981  . HERNIA REPAIR  09/11/2017   with mesh  . INGUINAL HERNIA REPAIR  07/30/2012   Procedure: HERNIA REPAIR INGUINAL ADULT;  Surgeon: Rolm Bookbinder, MD;  Location: [facility];  Service: General;  Laterality: Left;  Left Hernia Site  . INSERTION OF MESH  07/30/2012   Procedure: INSERTION OF MESH;  Surgeon: Rolm Bookbinder, MD;  Location: [facility];  Service: General;  Laterality: Left;  Left  Inguinal Hernia  . INSERTION OF MESH N/A 09/11/2017   Procedure: INSERTION OF MESH;  Surgeon: Rolm Bookbinder, MD;  Location: [facility];  Service: General;  Laterality: N/A;  . LAPAROSCOPY N/A 09/11/2017   Procedure: DIAGNOSTIC LAPAOSCOPY;  Surgeon: Rolm Bookbinder, MD;  Location: [facility];  Service: General;  Laterality: N/A;  ERAS pathway  . SPINE SURGERY     lumbar laminectomy  . TONSILLECTOMY      FAMHx:  Family History  Problem Relation Age of Onset  . Allergic rhinitis Son   . Coronary artery disease Mother   . Angioedema Neg Hx   . Asthma Neg Hx   . Atopy Neg Hx   . Eczema Neg Hx   . Immunodeficiency Neg Hx   . Urticaria Neg Hx     SOCHx:   reports that he quit smoking about 34 years ago. He has a 8.00 pack-year smoking history. He has never used smokeless tobacco. He reports that he does not drink alcohol or use drugs.  ALLERGIES:  No Known Allergies  ROS: Pertinent items noted in HPI and remainder of comprehensive ROS otherwise negative.  HOME MEDS: Current Outpatient Medications on File Prior to Visit  Medication Sig Dispense Refill  . aspirin 81 MG tablet Take 81 mg by mouth daily.    Marland Kitchen atorvastatin (LIPITOR) 20 MG tablet TAKE 1 TABLET ONCE DAILY AT 6 PM. 90 tablet 2  . Cholecalciferol (VITAMIN D3) 1000 units CAPS Take 1,000 Units by mouth daily.    Marland Kitchen Co-Enzyme Q-10 100 MG CAPS Take 100 mg by mouth daily.     Marland Kitchen DILT-XR 180 MG 24 hr capsule TAKE 1 CAPSULE DAILY. 30 capsule 7  . ferrous sulfate 325 (65 FE) MG tablet Take 325 mg by mouth daily with breakfast.    . gabapentin (NEURONTIN) 300 MG capsule Take 300 mg by mouth at bedtime.     . Multiple Vitamins-Minerals (PRESERVISION AREDS 2 PO) Take 1 capsule by mouth 2 (two) times daily.    . nitroGLYCERIN (NITROSTAT) 0.6 MG SL tablet Place 0.6 mg under the tongue every 5 (five) minutes as needed for chest pain.    Marland Kitchen oxymetazoline (AFRIN) 0.05 % nasal spray Place 1 spray into both nostrils at bedtime as needed for  congestion.    . sodium chloride (MURO 128) 5 % ophthalmic ointment Place 1 application into both eyes at bedtime.    . triamcinolone (NASACORT ALLERGY 24HR) 55 MCG/ACT AERO nasal inhaler Place 2 sprays into the nose daily as needed (for allergies).    . vitamin B-12 (CYANOCOBALAMIN) 1000 MCG tablet Take 1,000 mcg by mouth daily.     . vitamin C (ASCORBIC ACID) 250 MG tablet Take 250 mg by mouth daily.     . nitroGLYCERIN (NITRODUR - DOSED IN MG/24 HR) 0.2 mg/hr  patch Use 1/2 patch daily to the affected area (Patient not taking: Reported on 06/23/2019) 30 patch 1   No current facility-administered medications on file prior to visit.     LABS/IMAGING: No results found for this or any previous visit (from the past 48 hour(s)). No results found.  LIPID PANEL: No results found for: CHOL, TRIG, HDL, CHOLHDL, VLDL, LDLCALC, LDLDIRECT   WEIGHTS: Wt Readings from Last 3 Encounters:  06/23/19 150 lb (68 kg)  04/29/19 152 lb (68.9 kg)  03/26/19 146 lb (66.2 kg)    VITALS: BP 112/76   Pulse (!) 57   Temp (!) 96 F (35.6 C)   Ht 5\' 8"  (1.727 m)   Wt 150 lb (68 kg)   SpO2 95%   BMI 22.81 kg/m   EXAM: General appearance: alert and no distress Neck: no carotid bruit, no JVD and thyroid not enlarged, symmetric, no tenderness/mass/nodules Lungs: clear to auscultation bilaterally Heart: regular rate and rhythm, S1, S2 normal, no murmur, click, rub or gallop Abdomen: soft, non-tender; bowel sounds normal; no masses,  no organomegaly Extremities: extremities normal, atraumatic, no cyanosis or edema Pulses: 2+ and symmetric Skin: Skin color, texture, turgor normal. No rashes or lesions Neurologic: Grossly normal Psych: Pleasant  EKG: Sinus rhythm first-degree AV block at 62, RBBB-personally reviewed  ASSESSMENT: 1. Coronary artery disease status post CABG x4 with LIMA to LAD, free RIMA to RCA and left radial to OM (04/2009) 2. Remote history of atrial fibrillation-not anticoagulated  3. Hyperlipidemia 4. History of second-degree AV block 5. Thoracic aortic aneurysm 6. Chronic venous insufficiency 7. Ventricular bigeminy - (burden of 12% in 12/2018, resolved) 8. RBBB  PLAN: 1.   Andrew [name] has had resolution of his frequent PVCs for some reason.  EKG shows a stable rhythm today with first-degree AV block and chronic right bundle branch block.  He denies any chest pain.  He is seen both by thoracic surgery and vascular surgery for aneurysms which will be followed prospectively with repeat imaging in January of next year.  Plan follow-up with me annually or sooner as necessary.  Pixie Casino, MD, [facility], [place] Director of the [facility]  [facility] Diplomate of the American Board of Clinical Lipidology Attending Cardiologist  Direct Dial: [phone]  Fax: [phone]  Website:  [email or web address] Elroy Schembri 06/23/2019, 10:42 AM

## 2019-06-26 ENCOUNTER — Other Ambulatory Visit: Payer: Self-pay | Admitting: Podiatry

## 2019-06-26 ENCOUNTER — Ambulatory Visit: Payer: Medicare Other | Admitting: Podiatry

## 2019-06-26 ENCOUNTER — Encounter: Payer: Self-pay | Admitting: Podiatry

## 2019-06-26 ENCOUNTER — Other Ambulatory Visit: Payer: Self-pay

## 2019-06-26 ENCOUNTER — Ambulatory Visit (INDEPENDENT_AMBULATORY_CARE_PROVIDER_SITE_OTHER): Payer: Medicare Other

## 2019-06-26 ENCOUNTER — Ambulatory Visit: Payer: Self-pay

## 2019-06-26 DIAGNOSIS — G5781 Other specified mononeuropathies of right lower limb: Secondary | ICD-10-CM

## 2019-06-26 DIAGNOSIS — M2042 Other hammer toe(s) (acquired), left foot: Secondary | ICD-10-CM | POA: Diagnosis not present

## 2019-06-26 DIAGNOSIS — M2041 Other hammer toe(s) (acquired), right foot: Secondary | ICD-10-CM

## 2019-06-28 NOTE — Progress Notes (Signed)
Subjective:  Patient ID: Andrew [name], male    DOB: 06-Apr-1932,  MRN: [id number] HPI Chief Complaint  Patient presents with  . Hammer Toe    Rt 5th abnormal curling of the toe x years; 3/10 soreness when walking -pt denie injury -pt states whole foot feels weird/not norma.l Tx; none   . Skin Problem    Lt back heel and whole leg itching x 5 yrs -w/ redness -pt denies swelling Tx: Pt states Der. Rx and ointmen for  the skin     85 y.o. male presents with the above complaint.   ROS: Denies fever chills nausea vomiting muscle aches pains calf pain back pain chest pain shortness of breath.  Past Medical History:  Diagnosis Date  . Angioedema 06/01/2015  . Arthritis   . Back pain   . Cancer ([place])    squamous cell skin cancer carcinomas removed from hand and face  . Chronic kidney disease    ? bph, pt self catheterizes himself on schedule  . Complication of anesthesia    had to have Foley post op cabg and lumb lam  . Coronary artery disease   . Hypertension   . Thoracic aortic aneurysm ([place])    09/29/16 CT: 4.3 cm ascending, 4.5 cm descending. 1 yr f/u rec  . Urinary retention   . Wears glasses    Past Surgical History:  Procedure Laterality Date  . BACK SURGERY  2012   lumb lam  . CARDIAC CATHETERIZATION     2010-per patient  . COLONOSCOPY    . CORONARY ARTERY BYPASS GRAFT     2010  . EPIGASTRIC HERNIA REPAIR N/A 09/11/2017   Procedure: HERNIA REPAIR EPIGASTRIC ADULT;  Surgeon: Rolm Bookbinder, MD;  Location: [facility];  Service: General;  Laterality: N/A;  . HERNIA REPAIR     rih 1981  . HERNIA REPAIR  09/11/2017   with mesh  . INGUINAL HERNIA REPAIR  07/30/2012   Procedure: HERNIA REPAIR INGUINAL ADULT;  Surgeon: Rolm Bookbinder, MD;  Location: [facility];  Service: General;  Laterality: Left;  Left Hernia Site  . INSERTION OF MESH  07/30/2012   Procedure: INSERTION OF MESH;  Surgeon: Rolm Bookbinder, MD;  Location: [facility];  Service:  General;  Laterality: Left;  Left Inguinal Hernia  . INSERTION OF MESH N/A 09/11/2017   Procedure: INSERTION OF MESH;  Surgeon: Rolm Bookbinder, MD;  Location: [facility];  Service: General;  Laterality: N/A;  . LAPAROSCOPY N/A 09/11/2017   Procedure: DIAGNOSTIC LAPAOSCOPY;  Surgeon: Rolm Bookbinder, MD;  Location: [facility];  Service: General;  Laterality: N/A;  ERAS pathway  . SPINE SURGERY     lumbar laminectomy  . TONSILLECTOMY      Current Outpatient Medications:  .  aspirin 81 MG tablet, Take 81 mg by mouth daily., Disp: , Rfl:  .  atorvastatin (LIPITOR) 20 MG tablet, TAKE 1 TABLET ONCE DAILY AT 6 PM., Disp: 90 tablet, Rfl: 2 .  Cholecalciferol (VITAMIN D3) 1000 units CAPS, Take 1,000 Units by mouth daily., Disp: , Rfl:  .  Co-Enzyme Q-10 100 MG CAPS, Take 100 mg by mouth daily. , Disp: , Rfl:  .  DILT-XR 180 MG 24 hr capsule, TAKE 1 CAPSULE DAILY., Disp: 30 capsule, Rfl: 7 .  ferrous sulfate 325 (65 FE) MG tablet, Take 325 mg by mouth daily with breakfast., Disp: , Rfl:  .  gabapentin (NEURONTIN) 300 MG capsule, Take 300 mg by mouth at bedtime. ,  Disp: , Rfl:  .  Multiple Vitamins-Minerals (PRESERVISION AREDS 2 PO), Take 1 capsule by mouth 2 (two) times daily., Disp: , Rfl:  .  nitroGLYCERIN (NITRODUR - DOSED IN MG/24 HR) 0.2 mg/hr patch, Use 1/2 patch daily to the affected area (Patient not taking: Reported on 06/23/2019), Disp: 30 patch, Rfl: 1 .  nitroGLYCERIN (NITROSTAT) 0.6 MG SL tablet, Place 0.6 mg under the tongue every 5 (five) minutes as needed for chest pain., Disp: , Rfl:  .  oxymetazoline (AFRIN) 0.05 % nasal spray, Place 1 spray into both nostrils at bedtime as needed for congestion., Disp: , Rfl:  .  sodium chloride (MURO 128) 5 % ophthalmic ointment, Place 1 application into both eyes at bedtime., Disp: , Rfl:  .  triamcinolone (NASACORT ALLERGY 24HR) 55 MCG/ACT AERO nasal inhaler, Place 2 sprays into the nose daily as needed (for allergies)., Disp: , Rfl:  .  vitamin B-12  (CYANOCOBALAMIN) 1000 MCG tablet, Take 1,000 mcg by mouth daily. , Disp: , Rfl:  .  vitamin C (ASCORBIC ACID) 250 MG tablet, Take 250 mg by mouth daily. , Disp: , Rfl:   No Known Allergies Review of Systems Objective:  There were no vitals filed for this visit.  General: Well developed, nourished, in no acute distress, alert and oriented x3   Dermatological: Skin is warm, dry and supple bilateral. Nails x 10 are well maintained; remaining integument appears unremarkable at this time. There are no open sores, no preulcerative lesions, no rash or signs of infection present.  Venous stasis dermatitis with hemosiderin deposition  Vascular: Dorsalis Pedis artery and Posterior Tibial artery pedal pulses are 2/4 bilateral with immedate capillary fill time. Pedal hair growth present. No varicosities and no lower extremity edema present bilateral.  Venous stasis with pitting edema bilateral lower legs left greater than right.  Neruologic: Grossly intact via light touch bilateral. Vibratory intact via tuning fork bilateral. Protective threshold with Semmes Wienstein monofilament intact to all pedal sites bilateral. Patellar and Achilles deep tendon reflexes 2+ bilateral. No Babinski or clonus noted bilateral.  Neuralgia in the posterior thigh and lateral left calf with some numbness..  He has some pain on palpation to the third interspace of the right foot.  Musculoskeletal: No gross boney pedal deformities bilateral. No pain, crepitus, or limitation noted with foot and ankle range of motion bilateral. Muscular strength 5/5 in all groups tested bilateral.  Adductovarus rotated hammertoe deformities #4 #5 of the right foot.  Gait: Unassisted, Nonantalgic.    Radiographs:  Radiographs taken today demonstrate an osseously mature individual with adductovarus rotated hammertoe deformity and 4 #5 of the right foot.  Assessment & Plan:   Assessment: Venous stasis resulting in venous stasis dermatitis with  pruritus.  Hammertoe deformity 4 5 of the right foot.  Neuroma third interspace right foot.  Plan: He will continue to use his triamcinolone cream on his legs for the itching provided by his dermatologist.  I injected the neuroma third interspace of the right foot.  10 mg Kenalog 5 mg Marcaine point maximal tenderness.  We did discuss the possible need for dehydrated alcohol.  We did discuss the possible need for surgery to toes 4 and 5 of the right foot but he declined.     Parveen Freehling T. [place], [place]

## 2019-07-08 ENCOUNTER — Other Ambulatory Visit: Payer: Self-pay | Admitting: Thoracic Surgery (Cardiothoracic Vascular Surgery)

## 2019-07-08 DIAGNOSIS — I712 Thoracic aortic aneurysm, without rupture, unspecified: Secondary | ICD-10-CM

## 2019-07-20 ENCOUNTER — Other Ambulatory Visit: Payer: Self-pay | Admitting: Internal Medicine

## 2019-08-10 ENCOUNTER — Ambulatory Visit: Payer: Medicare Other

## 2019-08-11 ENCOUNTER — Ambulatory Visit: Payer: Medicare Other

## 2019-08-13 ENCOUNTER — Ambulatory Visit: Payer: Medicare Other | Attending: Internal Medicine

## 2019-08-13 DIAGNOSIS — Z23 Encounter for immunization: Secondary | ICD-10-CM | POA: Insufficient documentation

## 2019-08-13 NOTE — Progress Notes (Signed)
   Covid-19 Vaccination Clinic  Name:  Andrew [name]    MRN: [id number] DOB: 1932/06/29  08/13/2019  [name] was observed post Covid-19 immunization for 15 minutes without incidence. He was provided with Vaccine Information Sheet and instruction to access the V-Safe system.   [name] was instructed to call 911 with any severe reactions post vaccine: Marland Kitchen Difficulty breathing  . Swelling of your face and throat  . A fast heartbeat  . A bad rash all over your body  . Dizziness and weakness    Immunizations Administered    Name Date Dose VIS Date Route   Pfizer COVID-19 Vaccine 08/13/2019  9:33 AM 0.3 mL 07/04/2019 Intramuscular   Manufacturer: [place]   Lot: [id number]   [place]: [id number]

## 2019-08-18 ENCOUNTER — Other Ambulatory Visit: Payer: Self-pay | Admitting: Thoracic Surgery (Cardiothoracic Vascular Surgery)

## 2019-08-18 DIAGNOSIS — I712 Thoracic aortic aneurysm, without rupture, unspecified: Secondary | ICD-10-CM

## 2019-08-19 LAB — CREATININE, SERUM: Creat: 1.32 mg/dL — ABNORMAL HIGH (ref 0.70–1.11)

## 2019-08-21 ENCOUNTER — Ambulatory Visit
Admission: RE | Admit: 2019-08-21 | Discharge: 2019-08-21 | Disposition: A | Discharge status: Home / Self Care | Payer: Medicare Other | Source: Ambulatory Visit | Attending: Thoracic Surgery (Cardiothoracic Vascular Surgery) | Admitting: Thoracic Surgery (Cardiothoracic Vascular Surgery)

## 2019-08-21 DIAGNOSIS — I712 Thoracic aortic aneurysm, without rupture, unspecified: Secondary | ICD-10-CM

## 2019-08-21 MED ORDER — IOPAMIDOL (ISOVUE-370) INJECTION 76%
75.0000 mL | Freq: Once | INTRAVENOUS | Status: AC | PRN
  Administered 2019-08-21: 75 mL via INTRAVENOUS

## 2019-08-26 ENCOUNTER — Ambulatory Visit: Payer: Medicare Other | Admitting: Thoracic Surgery (Cardiothoracic Vascular Surgery)

## 2019-08-26 ENCOUNTER — Other Ambulatory Visit: Payer: Self-pay

## 2019-08-26 VITALS — BP 119/76 | HR 65 | Temp 97.7°F | Resp 20 | Ht 69.0 in | Wt 145.0 lb

## 2019-08-26 DIAGNOSIS — I714 Abdominal aortic aneurysm, without rupture, unspecified: Secondary | ICD-10-CM

## 2019-08-26 DIAGNOSIS — I712 Thoracic aortic aneurysm, without rupture: Secondary | ICD-10-CM | POA: Diagnosis not present

## 2019-08-26 DIAGNOSIS — I7123 Aneurysm of the descending thoracic aorta, without rupture: Secondary | ICD-10-CM

## 2019-08-26 NOTE — Progress Notes (Signed)
[address]       [place] [address]             [phone]      HPI: [name] returns for follow-up of his descending thoracic aortic aneurysm  Andrew [name] is an 86 year old man with a past history of remote tobacco abuse, atherosclerotic cardiovascular disease, descending thoracic aortic aneurysm, abdominal aortic aneurysm, coronary artery bypass grafting x3 in 2010, hypertension, angioedema, arthritis, chronic kidney disease, BPH, and back pain..  I saw him in July 2020 because of his enlarged descending aneurysm.  He also saw Dr. Carlis [name] for his abdominal aortic aneurysm.  He now returns for 65-month follow-up.  He has been feeling well.  His primary complaint is his macular degeneration.  Past Medical History:  Diagnosis Date  . Angioedema 06/01/2015  . Arthritis   . Back pain   . 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 Chronic kidney disease    ? bph, pt self catheterizes himself on schedule  . Complication of anesthesia    had to have Foley post op cabg and lumb lam  . Coronary artery disease   . Hypertension   . Thoracic aortic aneurysm ([place])    09/29/16 CT: 4.3 cm ascending, 4.5 cm descending. 1 yr f/u rec  . Urinary retention   . Wears glasses     Current Outpatient Medications  Medication Sig Dispense Refill  . aspirin 81 MG tablet Take 81 mg by mouth daily.    Marland Kitchen atorvastatin (LIPITOR) 20 MG tablet TAKE 1 TABLET ONCE DAILY AT 6 PM. 90 tablet 2  . Cholecalciferol (VITAMIN D3) 1000 units CAPS Take 1,000 Units by mouth daily.    Marland Kitchen Co-Enzyme Q-10 100 MG CAPS Take 100 mg by mouth daily.     Marland Kitchen DILT-XR 180 MG 24 hr capsule TAKE 1 CAPSULE DAILY. 30 capsule 5  . ferrous sulfate 325 (65 FE) MG tablet Take 325 mg by mouth daily with breakfast.    . gabapentin (NEURONTIN) 300 MG capsule Take 300 mg by mouth at bedtime.     . Multiple Vitamins-Minerals (PRESERVISION AREDS 2 PO) Take 1 capsule by mouth 2  (two) times daily.    . nitroGLYCERIN (NITRODUR - DOSED IN MG/24 HR) 0.2 mg/hr patch Use 1/2 patch daily to the affected area 30 patch 1  . nitroGLYCERIN (NITROSTAT) 0.6 MG SL tablet Place 0.6 mg under the tongue every 5 (five) minutes as needed for chest pain.    Marland Kitchen oxymetazoline (AFRIN) 0.05 % nasal spray Place 1 spray into both nostrils at bedtime as needed for congestion.    . sodium chloride (MURO 128) 5 % ophthalmic ointment Place 1 application into both eyes at bedtime.    . triamcinolone (NASACORT ALLERGY 24HR) 55 MCG/ACT AERO nasal inhaler Place 2 sprays into the nose daily as needed (for allergies).    . vitamin B-12 (CYANOCOBALAMIN) 1000 MCG tablet Take 1,000 mcg by mouth daily.     . vitamin C (ASCORBIC ACID) 250 MG tablet Take 250 mg by mouth daily.      No current facility-administered medications for this visit.    Physical Exam BP 119/76 (BP Location: Right Arm, Patient Position: Sitting, Cuff Size: Normal)   Pulse 65   Temp 97.7 F (36.5 C) (Skin)   Resp 20   Ht 5\' 9"  (1.753 m)   Wt 145 lb (65.8 kg)   SpO2  92% Comment: RA  BMI 21.8 kg/m  86 year old man in no acute distress Alert and oriented x3 with no focal motor deficit Lungs clear bilaterally Cardiac regular rate and rhythm with a 2/6 systolic murmur 2+ edema  Diagnostic Tests: CT ANGIOGRAPHY CHEST, ABDOMEN AND PELVIS  TECHNIQUE: Multidetector CT imaging through the chest, abdomen and pelvis was performed using the standard protocol during bolus administration of intravenous contrast. Multiplanar reconstructed images and MIPs were obtained and reviewed to evaluate the vascular anatomy.  CONTRAST:  85mL ISOVUE-370 IOPAMIDOL (ISOVUE-370) INJECTION 76%  COMPARISON:  CTA of the chest on 01/10/2019 and CT of the abdomen and pelvis on 09/18/2017  FINDINGS: CTA CHEST FINDINGS  Cardiovascular: Aneurysmal disease of the thoracic aorta shows slight interval increase since the prior study and  affects predominantly the mid to distal descending thoracic aorta. Maximal diameter on axial images of the lower descending thoracic aorta is approximately 5.8 cm compared to 5.4 cm previously at the same level. On coronal images current maximal diameter is estimated to be approximately 5.6 cm compared to 5.4 cm previously. The aortic root measures 3.7 cm, the ascending thoracic aorta measures 4.0 cm, the aortic arch measures 3.3 cm and the proximal descending thoracic aorta measures 4.2 cm. The most dilated segments of the mid to distal descending thoracic aorta shows slightly more prominent peripheral posterolateral mural thrombus compared to the prior study. There is no evidence of aneurysm rupture or dissection.  Proximal great vessels demonstrate stable patency without significant obstructive disease. Continued appearance of a tiny patent right vertebral artery with dominant left vertebral artery. Status post CABG with stable cardiac enlargement and no pericardial fluid. Stable caliber of central pulmonary arteries.  Mediastinum/Nodes: No enlarged mediastinal, hilar, or axillary lymph nodes. Thyroid gland, trachea, and esophagus demonstrate no significant findings.  Lungs/Pleura: Stable bronchiectasis in the right lower lobe with associated peripheral parenchymal scarring and consolidation. Stable scarring scattered elsewhere in both lungs. No evidence of edema, pneumothorax or suspicious pulmonary nodules. No pleural effusions.  Musculoskeletal: Stable osteopenia and mild loss of height of the T9 and T8 vertebral bodies.  Review of the MIP images confirms the above findings.  CTA ABDOMEN AND PELVIS FINDINGS  VASCULAR  Aorta: The abdominal aorta measures 3.4 cm just below the diaphragmatic hiatus. Irregular dilatation begins at the level of the SMA origin and immediately tapers to larger caliber at the level of the left renal artery origin. Abdominal aortic  aneurysm shows increase in size since 2019 with maximal diameter now of approximately 4.5 x 4.8 cm compared to 3.9 x 4.3 cm on the prior study. There is associated increased mural thrombus circumferentially in the aneurysm. No evidence of aneurysm rupture or aortic dissection.  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 The celiac trunk shows mild poststenotic dilatation and measures 10 mm in diameter. Distal branches are normally patent.  SMA: High-grade stenosis at the origin of the SMA of greater than 80%. Poststenotic dilatation of the proximal SMA trunk up to 10 mm. Distal branches appear patent.  Renals: Single renal arteries present bilaterally. Due to aneurysm and tortuosity of the aorta, the right renal artery is significantly higher than the left. Both renal artery origins demonstrate probable hemodynamically significant stenoses with estimated greater than 70% stenosis on the left and critical stenosis of likely greater than 80% on the right. There is some associated plaque ulceration at the left renal artery orifice.  IMA: The IMA origin is occluded  by mural thrombus in the aneurysm. Distal IMA territory is supplied by SMA collaterals.  Inflow: Atherosclerosis and tortuosity of the iliac arteries without significant obstructive disease. Both common iliac arteries demonstrate fusiform dilatation with the right measuring approximately 2.1 cm in greatest diameter and the left 1.8 cm. Bilateral internal and external iliac arteries demonstrate no significant obstructive disease or focal aneurysmal disease. Common femoral arteries and femoral bifurcations are normally patent.  Review of the MIP images confirms the above findings.  NON-VASCULAR  Hepatobiliary: Previously noted probable tiny right lobe hepatic cysts are not appreciated on the arterial phase exam. Gallbladder appears unremarkable. No biliary  ductal dilatation identified.  Pancreas: Unremarkable. No pancreatic ductal dilatation or surrounding inflammatory changes.  Spleen: Normal in size without focal abnormality.  Adrenals/Urinary Tract: Adrenal glands are unremarkable. Kidneys are normal, without renal calculi, focal lesion, or hydronephrosis. Bladder is unremarkable.  Stomach/Bowel: Visualized bowel shows no evidence of obstruction, ileus or inflammation. No lesions or free air identified.  Lymphatic: No enlarged lymph nodes identified.  Reproductive: Prostate is unremarkable.  Other: No abdominal wall hernia or abnormality. No abdominopelvic ascites.  Musculoskeletal: Prior posterior lumbar fusion at L4-5. Osteopenia without evidence of fracture.  Review of the MIP images confirms the above findings.  IMPRESSION: 1. Slight interval increase in size of descending thoracic aortic aneurysm now measuring 5.6-5.8 cm in greatest diameter compared to 5.4 cm previously. 2. Interval increase in size of abdominal aortic aneurysm now measuring 4.5 x 4.8 cm compared to 3.9 x 4.3 cm on the prior study in 2019. 3. 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 4. 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 5. Probable hemodynamically significant stenoses of bilateral renal arteries. 6. Stable fusiform dilatation of bilateral common iliac arteries. 7. Stable bronchiectasis and parenchymal scarring in the right lower lobe. 8. Stable osteopenia and mild loss of height of the T9 and T8 vertebral bodies.   Electronically Signed   By: Aletta Edouard M.D.   On: 08/21/2019 11:54 I personally reviewed the CT images and concur with the findings noted above.  The descending thoracic aortic aneurysm does appear slightly larger by about 2 mm.  Impression: Andrew [name] is an 86 year old man with a past history of remote tobacco abuse, atherosclerotic  cardiovascular disease, descending thoracic aortic aneurysm, abdominal aortic aneurysm, CAD, coronary artery bypass grafting x3 in 2010, hypertension, angioedema, arthritis, chronic kidney disease, BPH, and back pain.  Descending thoracic aortic aneurysm-appears to have grown larger in the interim over the past 6 months.  I get around 5.5 to 5.6 cm in diameter.  It is now at the point that we need to consider reintervention.  Given his age and comorbidities any intervention would be extremely high risk.  I do not feel he is a candidate for open repair.  I will discuss with Dr. Carlis [name] whether a stent graft might be feasible.  Abdominal aortic aneurysm-appears stable over past 6 months.  Blood pressures well controlled on current regimen  Plan: We will review films with Dr. Carlis [name] I will plan to see him back in 6 months unless we recommend an intervention at this time.  Melrose Nakayama, MD [facility] [phone]

## 2019-08-28 ENCOUNTER — Ambulatory Visit: Payer: Medicare Other | Admitting: Podiatry

## 2019-09-01 ENCOUNTER — Ambulatory Visit: Payer: Medicare Other | Attending: Internal Medicine

## 2019-09-01 ENCOUNTER — Telehealth (HOSPITAL_COMMUNITY): Payer: Self-pay

## 2019-09-01 DIAGNOSIS — Z23 Encounter for immunization: Secondary | ICD-10-CM | POA: Insufficient documentation

## 2019-09-01 NOTE — Progress Notes (Signed)
   Covid-19 Vaccination Clinic  Name:  Andrew [name]    MRN: [id number] DOB: 03-Feb-1932  09/01/2019  [name] was observed post Covid-19 immunization for 15 minutes without incidence. He was provided with Vaccine Information Sheet and instruction to access the V-Safe system.   [name] was instructed to call 911 with any severe reactions post vaccine: Marland Kitchen Difficulty breathing  . Swelling of your face and throat  . A fast heartbeat  . A bad rash all over your body  . Dizziness and weakness    Immunizations Administered    Name Date Dose VIS Date Route   Pfizer COVID-19 Vaccine 09/01/2019 12:07 PM 0.3 mL 07/04/2019 Intramuscular   Manufacturer: [place]   Lot: [id number]   [place]: [id number]

## 2019-09-01 NOTE — Telephone Encounter (Signed)
The above patient or their representative was contacted and gave the following answers to these questions:         Do you have any of the following symptoms?    NO  Fever                    Cough                   Shortness of breath  Do  you have any of the following other symptoms?    muscle pain         vomiting,        diarrhea        rash         weakness        red eye        abdominal pain         bruising          bruising or bleeding              joint pain           severe headache    Have you been in contact with someone who was or has been sick in the past 2 weeks?  NO  Yes                 Unsure                         Unable to assess   Does the person that you were in contact with have any of the following symptoms?   Cough         shortness of breath           muscle pain         vomiting,            diarrhea            rash            weakness           fever            red eye           abdominal pain           bruising  or  bleeding                joint pain                severe headache                 COMMENTS OR ACTION PLAN FOR THIS PATIENT:        ALL QUESTIONS WERE ANSWERED/[facility] PT HAD 2ND COVID SHOT TODAY

## 2019-09-02 ENCOUNTER — Ambulatory Visit (INDEPENDENT_AMBULATORY_CARE_PROVIDER_SITE_OTHER): Payer: Medicare Other | Admitting: Vascular Surgery

## 2019-09-02 ENCOUNTER — Other Ambulatory Visit: Payer: Self-pay

## 2019-09-02 ENCOUNTER — Encounter: Payer: Self-pay | Admitting: Vascular Surgery

## 2019-09-02 VITALS — BP 171/95 | HR 72 | Temp 98.1°F | Resp 16 | Ht 68.0 in | Wt 150.0 lb

## 2019-09-02 DIAGNOSIS — I712 Thoracic aortic aneurysm, without rupture, unspecified: Secondary | ICD-10-CM

## 2019-09-02 NOTE — Progress Notes (Signed)
Patient name: Andrew [name] MRN: [id number] DOB: 03/18/1932 Sex: male  REASON FOR CONSULT: Discuss endovascular repair of thoracic aneurysm  HPI: [name] is a 86 y.o. male, with history of hypertension, coronary artery disease status post CABG, remote tobacco abuse (quit 1986), CKD, and known thoracic aneurysm followed by CT surgery that presents as a referral from Dr. Roxan Hockey (CT surgery) to discuss stent repair of thoracic aneurysm.  He recently saw Dr. Roxan Hockey for 83-month follow-up of his thoracic aneurysm.  He had a CTA chest abdomen pelvis on 08/21/2019 thoracic aneurysm now measures 5.8 cm.  Slight increase in abdominal aortic aneurysm to 4.8 cm.  Patient states he remains very active.  He goes to the gym every day [facility] [employer]  Very functional.  Lives independently with his wife.  No chest pain or back pain.   Past Medical History:  Diagnosis Date  . Angioedema 06/01/2015  . Arthritis   . Back pain   . 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 Chronic kidney disease    ? bph, pt self catheterizes himself on schedule  . Complication of anesthesia    had to have Foley post op cabg and lumb lam  . Coronary artery disease   . Hypertension   . Thoracic aortic aneurysm ([place])    09/29/16 CT: 4.3 cm ascending, 4.5 cm descending. 1 yr f/u rec  . Urinary retention   . Wears glasses     Past Surgical History:  Procedure Laterality Date  . BACK SURGERY  2012   lumb lam  . CARDIAC CATHETERIZATION     2010-per patient  . COLONOSCOPY    . CORONARY ARTERY BYPASS GRAFT     2010  . EPIGASTRIC HERNIA REPAIR N/A 09/11/2017   Procedure: HERNIA REPAIR EPIGASTRIC ADULT;  Surgeon: Rolm Bookbinder, MD;  Location: [facility];  Service: General;  Laterality: N/A;  . HERNIA REPAIR     rih 1981  . HERNIA REPAIR  09/11/2017   with mesh  . INGUINAL HERNIA REPAIR  07/30/2012   Procedure: HERNIA REPAIR INGUINAL ADULT;   Surgeon: Rolm Bookbinder, MD;  Location: [facility];  Service: General;  Laterality: Left;  Left Hernia Site  . INSERTION OF MESH  07/30/2012   Procedure: INSERTION OF MESH;  Surgeon: Rolm Bookbinder, MD;  Location: [facility];  Service: General;  Laterality: Left;  Left Inguinal Hernia  . INSERTION OF MESH N/A 09/11/2017   Procedure: INSERTION OF MESH;  Surgeon: Rolm Bookbinder, MD;  Location: [facility];  Service: General;  Laterality: N/A;  . LAPAROSCOPY N/A 09/11/2017   Procedure: DIAGNOSTIC LAPAOSCOPY;  Surgeon: Rolm Bookbinder, MD;  Location: [facility];  Service: General;  Laterality: N/A;  ERAS pathway  . SPINE SURGERY     lumbar laminectomy  . TONSILLECTOMY      Family History  Problem Relation Age of Onset  . Allergic rhinitis Son   . Coronary artery disease Mother   . Angioedema Neg Hx   . Asthma Neg Hx   . Atopy Neg Hx   . Eczema Neg Hx   . Immunodeficiency Neg Hx   . Urticaria Neg Hx     SOCIAL HISTORY: Social History   Socioeconomic History  . Marital status: Married    Spouse name: Not on file  . Number of children: Not on file  . Years of education: Not on file  .  Highest education level: Not on file  Occupational History  . Not on file  Tobacco Use  . Smoking status: Former Smoker    Packs/day: 1.00    Years: 8.00    Pack years: 8.00    Quit date: 07/24/1984    Years since quitting: 35.1  . Smokeless tobacco: Never Used  Substance and Sexual Activity  . Alcohol use: No  . Drug use: No  . Sexual activity: Not on file  Other Topics Concern  . Not on file  Social History Narrative  . Not on file   Social Determinants of Health   Financial Resource Strain:   . Difficulty of Paying Living Expenses: Not on file  Food Insecurity:   . Worried About Charity fundraiser in the Last Year: Not on file  . Ran Out of Food in the Last Year: Not on file  Transportation Needs:   . Lack of Transportation (Medical): Not on file  .  Lack of Transportation (Non-Medical): Not on file  Physical Activity:   . Days of Exercise per Week: Not on file  . Minutes of Exercise per Session: Not on file  Stress:   . Feeling of Stress : Not on file  Social Connections:   . Frequency of Communication with Friends and Family: Not on file  . Frequency of Social Gatherings with Friends and Family: Not on file  . Attends Religious Services: Not on file  . Active Member of Clubs or Organizations: Not on file  . Attends Archivist Meetings: Not on file  . Marital Status: Not on file  Intimate Partner Violence:   . Fear of Current or Ex-Partner: Not on file  . Emotionally Abused: Not on file  . Physically Abused: Not on file  . Sexually Abused: Not on file    No Known Allergies  Current Outpatient Medications  Medication Sig Dispense Refill  . aspirin 81 MG tablet Take 81 mg by mouth daily.    Marland Kitchen atorvastatin (LIPITOR) 20 MG tablet TAKE 1 TABLET ONCE DAILY AT 6 PM. 90 tablet 2  . Cholecalciferol (VITAMIN D3) 1000 units CAPS Take 1,000 Units by mouth daily.    Marland Kitchen Co-Enzyme Q-10 100 MG CAPS Take 100 mg by mouth daily.     Marland Kitchen DILT-XR 180 MG 24 hr capsule TAKE 1 CAPSULE DAILY. 30 capsule 5  . ferrous sulfate 325 (65 FE) MG tablet Take 325 mg by mouth daily with breakfast.    . gabapentin (NEURONTIN) 300 MG capsule Take 300 mg by mouth at bedtime.     . nitroGLYCERIN (NITRODUR - DOSED IN MG/24 HR) 0.2 mg/hr patch Use 1/2 patch daily to the affected area 30 patch 1  . nitroGLYCERIN (NITROSTAT) 0.6 MG SL tablet Place 0.6 mg under the tongue every 5 (five) minutes as needed for chest pain.    Marland Kitchen oxymetazoline (AFRIN) 0.05 % nasal spray Place 1 spray into both nostrils at bedtime as needed for congestion.    . sodium chloride (MURO 128) 5 % ophthalmic ointment Place 1 application into both eyes at bedtime.    . triamcinolone (NASACORT ALLERGY 24HR) 55 MCG/ACT AERO nasal inhaler Place 2 sprays into the nose daily as needed (for  allergies).    . vitamin B-12 (CYANOCOBALAMIN) 1000 MCG tablet Take 1,000 mcg by mouth daily.     . vitamin C (ASCORBIC ACID) 250 MG tablet Take 250 mg by mouth daily.     . Multiple Vitamins-Minerals (PRESERVISION AREDS 2 PO)  Take 1 capsule by mouth 2 (two) times daily.     No current facility-administered medications for this visit.    REVIEW OF SYSTEMS:  [X]  denotes positive finding, [ ]  denotes negative finding Cardiac  Comments:  Chest pain or chest pressure:    Shortness of breath upon exertion:    Short of breath when lying flat:    Irregular heart rhythm:        Vascular    Pain in calf, thigh, or hip brought on by ambulation:    Pain in feet at night that wakes you up from your sleep:     Blood clot in your veins:    Leg swelling:         Pulmonary    Oxygen at home:    Productive cough:     Wheezing:         Neurologic    Sudden weakness in arms or legs:     Sudden numbness in arms or legs:     Sudden onset of difficulty speaking or slurred speech:    Temporary loss of vision in one eye:     Problems with dizziness:         Gastrointestinal    Blood in stool:     Vomited blood:         Genitourinary    Burning when urinating:     Blood in urine:        Psychiatric    Major depression:         Hematologic    Bleeding problems:    Problems with blood clotting too easily:        Skin    Rashes or ulcers:        Constitutional    Fever or chills:      PHYSICAL EXAM: Vitals:   09/02/19 1616  BP: (!) 171/95  Pulse: 72  Resp: 16  Temp: 98.1 F (36.7 C)  TempSrc: Temporal  SpO2: 94%  Weight: 150 lb (68 kg)  Height: 5\' 8"  (1.727 m)    GENERAL: The patient is a well-nourished male, in no acute distress. The vital signs are documented above. CARDIAC: There is a regular rate and rhythm.  VASCULAR:  Palpable femoral pulses bilaterally Palpable dorsalis pedis pulses bilaterally PULMONARY: There is good air exchange bilaterally without wheezing or  rales. ABDOMEN: Soft and non-tender with normal pitched bowel sounds.  MUSCULOSKELETAL: There are no major deformities or cyanosis. NEUROLOGIC: No focal weakness or paresthesias are detected.   DATA:   I independently reviewed his CTA chest and agree that is thoracic aorta looks degenerated from beyond the left subclavian artery all the way down above the celiac.  The greatest dimension I measures of his thoracic aneurysm is 5.8 cm.  Assessment/Plan:  Discussed with Mr. Rohlfs CTA findings with slight interval increase in his descending thoracic aneurysm now measuring approximately 5.8 cm.  He was referred back by Dr. Roxan Hockey who feels he is not a candidate for open repair.  He remains very active and independent and exercises daily.  He is very interested in having his thoracic aneurysm repaired and we discussed option of stent graft with a transfemoral approach.  My biggest concern is given the extent of thoracic aorta that will require coverage from the left subclavian all the way to just above the celiac given a fairly extensive degenerated thoracic aorta.  Discussed with him and his wife risk of spinal cord ischemia and paralysis of approximately 2  to 5%.  Will require spinal drain by anesthesia.   In addition to risk of anesthesia (MI, stroke, etc)as well as access issues he remains high risk.  Discussed alternative option of continued observation.  I do think he has option for stent graft repair.  He would like to get scheduled for September 22, 2019 and we will get him on the schedule.   Marty Heck, MD [facility] Office: [phone]

## 2019-09-05 ENCOUNTER — Encounter: Payer: Self-pay | Admitting: Vascular Surgery

## 2019-09-05 ENCOUNTER — Other Ambulatory Visit: Payer: Self-pay

## 2019-09-16 NOTE — Progress Notes (Signed)
Andrew [name], scheduler for Dr. Carlis Abbott, called and asked about patient instructions for ASA 81mg  for surgery scheduled for 09/22/19. Per Andrew [name] on the phone, ok to instruct patient to continue taking ASA as prescribed and to also continue to take ASA on day of surgery.

## 2019-09-16 NOTE — Progress Notes (Signed)
[place], [place] [facility] [place] [address] Phone: [phone] Fax: [phone]      Your procedure is scheduled on Monday September 22, 2019.  Report to [facility] Main Entrance "A" at 07:30 A.M., and check in at the Admitting office.  Call this number if you have problems the morning of surgery:  [phone]  Call [phone] if you have any questions prior to your surgery date Monday-Friday 8am-4pm    Remember:  Do not eat or drink after midnight the night before your surgery    Take these medicines the morning of surgery with A SIP OF WATER: aspirin 81 MG  nitroGLYCERIN (NITROSTAT) - as needed for chest pain  7 days prior to surgery STOP taking Aleve, Naproxen, Ibuprofen, Motrin, Advil, Goody's, BC's, all herbal medications, fish oil, and all vitamins.    The Morning of Surgery  Do not wear jewelry, make-up or nail polish.  Do not wear lotions, powders, or perfumes/colognes, or deodorant  Men may shave face and neck.  Do not bring valuables to the hospital.  [facility] is not responsible for any belongings or valuables.  If you are a smoker, DO NOT Smoke 24 hours prior to surgery  If you wear a CPAP at night please bring your mask the morning of surgery   Remember that you must have someone to transport you home after your surgery, and remain with you for 24 hours if you are discharged the same day.   Please bring cases for contacts, glasses, hearing aids, dentures or bridgework because it cannot be worn into surgery.    Leave your suitcase in the car.  After surgery it may be brought to your room.  For patients admitted to the hospital, discharge time will be determined by your treatment team.  Patients discharged the day of surgery will not be allowed to drive home.    Special instructions:   [place]- Preparing For Surgery  Before surgery, you can play an important role. Because  skin is not sterile, your skin needs to be as free of germs as possible. You can reduce the number of germs on your skin by washing with CHG (chlorahexidine gluconate) Soap before surgery.  CHG is an antiseptic cleaner which kills germs and bonds with the skin to continue killing germs even after washing.    Oral Hygiene is also important to reduce your risk of infection.  Remember - BRUSH YOUR TEETH THE MORNING OF SURGERY WITH YOUR REGULAR TOOTHPASTE  Please do not use if you have an allergy to CHG or antibacterial soaps. If your skin becomes reddened/irritated stop using the CHG.  Do not shave (including legs and underarms) for at least 48 hours prior to first CHG shower. It is OK to shave your face.  Please follow these instructions carefully.   1. Shower the NIGHT BEFORE SURGERY and the MORNING OF SURGERY with CHG Soap.   2. If you chose to wash your hair, wash your hair first as usual with your normal shampoo.  3. After you shampoo, rinse your hair and body thoroughly to remove the shampoo.  4. Use CHG as you would any other liquid soap. You can apply CHG directly to the skin and wash gently with a scrungie or a clean washcloth.   5. Apply the CHG Soap to your body ONLY FROM THE NECK DOWN.  Do not use on open wounds or open sores. Avoid contact  with your eyes, ears, mouth and genitals (private parts). Wash Face and genitals (private parts)  with your normal soap.   6. Wash thoroughly, paying special attention to the area where your surgery will be performed.  7. Thoroughly rinse your body with warm water from the neck down.  8. DO NOT shower/wash with your normal soap after using and rinsing off the CHG Soap.  9. Pat yourself dry with a CLEAN TOWEL.  10. Wear CLEAN PAJAMAS to bed the night before surgery, wear comfortable clothes the morning of surgery  11. Place CLEAN SHEETS on your bed the night of your first shower and DO NOT SLEEP WITH PETS.    Day of Surgery:  Please  shower the morning of surgery with the CHG soap Do not apply any deodorants/lotions. Please wear clean clothes to the hospital/surgery center.   Remember to brush your teeth WITH YOUR REGULAR TOOTHPASTE.   Please read over the following fact sheets that you were given.

## 2019-09-17 ENCOUNTER — Other Ambulatory Visit: Payer: Self-pay

## 2019-09-17 ENCOUNTER — Encounter (HOSPITAL_COMMUNITY): Payer: Self-pay

## 2019-09-17 ENCOUNTER — Encounter (HOSPITAL_COMMUNITY)
Admission: RE | Admit: 2019-09-17 | Discharge: 2019-09-17 | Disposition: A | Discharge status: Home / Self Care | Payer: Medicare Other | Source: Ambulatory Visit | Attending: Vascular Surgery | Admitting: Vascular Surgery

## 2019-09-17 DIAGNOSIS — M1990 Unspecified osteoarthritis, unspecified site: Secondary | ICD-10-CM | POA: Insufficient documentation

## 2019-09-17 DIAGNOSIS — Z79899 Other long term (current) drug therapy: Secondary | ICD-10-CM | POA: Diagnosis not present

## 2019-09-17 DIAGNOSIS — I712 Thoracic aortic aneurysm, without rupture: Secondary | ICD-10-CM | POA: Diagnosis not present

## 2019-09-17 DIAGNOSIS — Z87891 Personal history of nicotine dependence: Secondary | ICD-10-CM | POA: Diagnosis not present

## 2019-09-17 DIAGNOSIS — Z01812 Encounter for preprocedural laboratory examination: Secondary | ICD-10-CM | POA: Insufficient documentation

## 2019-09-17 DIAGNOSIS — Z951 Presence of aortocoronary bypass graft: Secondary | ICD-10-CM | POA: Insufficient documentation

## 2019-09-17 DIAGNOSIS — I10 Essential (primary) hypertension: Secondary | ICD-10-CM | POA: Insufficient documentation

## 2019-09-17 DIAGNOSIS — Z01818 Encounter for other preprocedural examination: Secondary | ICD-10-CM | POA: Insufficient documentation

## 2019-09-17 DIAGNOSIS — Z981 Arthrodesis status: Secondary | ICD-10-CM | POA: Insufficient documentation

## 2019-09-17 DIAGNOSIS — I2510 Atherosclerotic heart disease of native coronary artery without angina pectoris: Secondary | ICD-10-CM | POA: Diagnosis not present

## 2019-09-17 DIAGNOSIS — Z85828 Personal history of other malignant neoplasm of skin: Secondary | ICD-10-CM | POA: Diagnosis not present

## 2019-09-17 DIAGNOSIS — Z7982 Long term (current) use of aspirin: Secondary | ICD-10-CM | POA: Diagnosis not present

## 2019-09-17 HISTORY — DX: Abdominal aortic aneurysm, without rupture: I71.4

## 2019-09-17 HISTORY — DX: Abdominal aortic aneurysm, without rupture, unspecified: I71.40

## 2019-09-17 LAB — COMPREHENSIVE METABOLIC PANEL
ALT: 11 U/L (ref 0–44)
AST: 20 U/L (ref 15–41)
Albumin: 3.7 g/dL (ref 3.5–5.0)
Alkaline Phosphatase: 111 U/L (ref 38–126)
Anion gap: 11 (ref 5–15)
BUN: 26 mg/dL — ABNORMAL HIGH (ref 8–23)
CO2: 28 mmol/L (ref 22–32)
Calcium: 9.4 mg/dL (ref 8.9–10.3)
Chloride: 100 mmol/L (ref 98–111)
Creatinine, Ser: 1.28 mg/dL — ABNORMAL HIGH (ref 0.61–1.24)
GFR calc Af Amer: 59 mL/min — ABNORMAL LOW (ref >60)
GFR calc non Af Amer: 51 mL/min — ABNORMAL LOW (ref >60)
Glucose, Bld: 107 mg/dL — ABNORMAL HIGH (ref 70–99)
Potassium: 4.6 mmol/L (ref 3.5–5.1)
Sodium: 139 mmol/L (ref 135–145)
Total Bilirubin: 0.7 mg/dL (ref 0.3–1.2)
Total Protein: 7.1 g/dL (ref 6.5–8.1)

## 2019-09-17 LAB — CBC
HCT: 41.2 % (ref 39.0–52.0)
Hemoglobin: 12.9 g/dL — ABNORMAL LOW (ref 13.0–17.0)
MCH: 29.5 pg (ref 26.0–34.0)
MCHC: 31.3 g/dL (ref 30.0–36.0)
MCV: 94.3 fL (ref 80.0–100.0)
Platelets: 209 10*3/uL (ref 150–400)
RBC: 4.37 MIL/uL (ref 4.22–5.81)
RDW: 14.5 % (ref 11.5–15.5)
WBC: 8.6 10*3/uL (ref 4.0–10.5)
nRBC: 0 % (ref 0.0–0.2)

## 2019-09-17 LAB — SURGICAL PCR SCREEN
MRSA, PCR: NEGATIVE
Staphylococcus aureus: NEGATIVE

## 2019-09-17 LAB — TYPE AND SCREEN
ABO/RH(D): O POS
Antibody Screen: NEGATIVE

## 2019-09-17 LAB — APTT: aPTT: 28 seconds (ref 24–36)

## 2019-09-17 LAB — PROTIME-INR
INR: 1 (ref 0.8–1.2)
Prothrombin Time: 13.5 seconds (ref 11.4–15.2)

## 2019-09-17 NOTE — Progress Notes (Signed)
PCP - Carlyle Basques Cardiologist - Lyman Bishop  Chest x-ray - n/a EKG - 06-23-19 ECHO - 01-27-19 Cardiac Cath - 2010   Aspirin Instructions: Continue to take, even on day of surgery, per surgeon's office   COVID TEST- Friday, 09-19-19   Anesthesia review: yes, heart history  Patient denies shortness of breath, fever, cough and chest pain at PAT appointment   All instructions explained to the patient, with a verbal understanding of the material. Patient agrees to go over the instructions while at home for a better understanding. Patient also instructed to self quarantine after being tested for COVID-19. The opportunity to ask questions was provided.

## 2019-09-18 ENCOUNTER — Encounter (HOSPITAL_COMMUNITY): Payer: Self-pay

## 2019-09-18 NOTE — Anesthesia Preprocedure Evaluation (Addendum)
Anesthesia Evaluation  Patient identified by MRN, date of birth, ID band Patient awake    Reviewed: Allergy & Precautions, H&P , NPO status , Patient's Chart, lab work & pertinent test results  Airway Mallampati: II  TM Distance: >3 FB Neck ROM: Full    Dental no notable dental hx. (+) Teeth Intact, Dental Advisory Given   Pulmonary neg pulmonary ROS, former smoker,    Pulmonary exam normal breath sounds clear to auscultation       Cardiovascular Exercise Tolerance: Good hypertension, Pt. on medications + CAD, + CABG and + Peripheral Vascular Disease  negative cardio ROS   Rhythm:Regular Rate:Normal  Echo 01/27/19: IMPRESSIONS  1. The left ventricle has normal systolic function with an ejection  fraction of 60-65%. The cavity size was normal. Left ventricular diastolic  Doppler parameters are consistent with impaired relaxation. Elevated left  ventricular end-diastolic pressure  The E/e' is 29.  2. The right ventricle has normal systolic function. The cavity was  mildly enlarged. There is no increase in right ventricular wall thickness.  Right ventricular systolic pressure 36 mmHg.  3. Left atrial size was mildly dilated.  4. The aortic valve is grossly normal. No stenosis of the aortic valve.  5. Tricuspid valve regurgitation is mild-moderate.  6. The inferior vena cava was normal in size with <50% respiratory  Variability.    Neuro/Psych negative neurological ROS  negative psych ROS   GI/Hepatic negative GI ROS, Neg liver ROS,   Endo/Other  negative endocrine ROS  Renal/GU Renal InsufficiencyRenal diseasenegative Renal ROS  negative genitourinary   Musculoskeletal  (+) Arthritis , Osteoarthritis,    Abdominal   Peds  Hematology negative hematology ROS (+)   Anesthesia Other Findings   Reproductive/Obstetrics negative OB ROS                           Anesthesia  Physical Anesthesia Plan  ASA: III  Anesthesia Plan: General   Post-op Pain Management:    Induction: Intravenous  PONV Risk Score and Plan: 3 and Ondansetron, Dexamethasone and Treatment may vary due to age or medical condition  Airway Management Planned: Oral ETT  Additional Equipment: Arterial line and Spinal Drain  Intra-op Plan:   Post-operative Plan: Possible Post-op intubation/ventilation  Informed Consent: I have reviewed the patients History and Physical, chart, labs and discussed the procedure including the risks, benefits and alternatives for the proposed anesthesia with the patient or authorized representative who has indicated his/her understanding and acceptance.     Dental advisory given  Plan Discussed with: CRNA  Anesthesia Plan Comments: (PAT note written 09/18/2019 by Myra Gianotti, PA-C. History CABG (cardiologist Dr. Debara Pickett), TAA, AAA, angioedema, BPH/hypotonic bladder/urinary retention (intermittent self-cath), L4-5 fusion 2015.   Per Dr. Ainsley Spinner progress note, he will need a spinal drain per anesthesia.   )     Anesthesia Quick Evaluation

## 2019-09-18 NOTE — Progress Notes (Signed)
Anesthesia Chart Review:  Case: [id number] Date/Time: 09/22/19 0915   Procedure: THORACIC AORTIC ENDOVASCULAR STENT GRAFT (N/A )   Anesthesia type: General   Pre-op diagnosis: THORACIC AORTIC ANEURYSM WITHOUT RUPTURE   Location: [facility] OR ROOM 16 / [place] OR   Surgeons: Marty Heck, MD      DISCUSSION: Patient is an 86 year old male scheduled for the above procedure. CT surgery did not feel he was a candidate for open repair, so referred him for TEVAR. Per Dr. Ainsley Spinner 09/02/19 note, he will require a spinal drain by anesthesia.   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   Last cardiology evaluation with Dr. Debara Pickett was on 06/23/19 and "From a cardiac standpoint he is doing well." PVCs/palpitations has improved since summer 2020. 01/2019 echo showed normal LVEF. One year follow-up recommended.   COVID-19 vaccine 09/01/19.  Presurgical COVID-19 test is scheduled for 09/19/2019.  He was not able to provide a urine specimen at his PAT visit so to be done on the day of surgery.   VS: BP 117/83   Pulse 67   Temp 36.9 C   Resp 17   Ht 5\' 8"  (1.727 m)   Wt 69 kg   SpO2 95%   BMI 23.14 kg/m     PROVIDERS: [name], MD has been PCP in the past Lyman Bishop, MD is cardiologist Modesto Charon, MD is CT surgeon Tresa Endo, MD is urologist [facility] Last visit 08/25/19. - He saw  pulmonologist Marshell Garfinkel, MD on 09/20/16 for abnormal CXR with bibasilar fibrosis. Chest CT showed no ILD, but changes of bronchiectasis and scarring which could be related to prior infection or asbestos exposure.     LABS: Labs reviewed: Acceptable for surgery. (all labs ordered are listed, but only abnormal results are displayed)  Labs Reviewed  CBC - Abnormal; Notable for the following components:      Result Value   Hemoglobin 12.9 (*)    All other components within normal limits  COMPREHENSIVE METABOLIC PANEL - Abnormal; Notable for the following components:   Glucose, Bld 107 (*)    BUN 26 (*)    Creatinine, Ser 1.28 (*)    GFR calc non Af Amer 51 (*)    GFR calc Af Amer 59 (*)    All other components within normal limits  SURGICAL PCR SCREEN  APTT  PROTIME-INR  TYPE AND SCREEN     IMAGES: CTA chest/abd/pelvis 08/21/19: IMPRESSION: 1. Slight interval increase in size of descending thoracic aortic aneurysm now measuring 5.6-5.8 cm in greatest diameter compared to 5.4 cm previously. 2. Interval increase in size of abdominal aortic aneurysm now measuring 4.5 x 4.8 cm compared to 3.9 x 4.3 cm on the prior study in 2019. 3. 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 4. 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 5. Probable hemodynamically significant stenoses of bilateral renal arteries. 6. Stable fusiform dilatation of bilateral common iliac arteries. 7. Stable  bronchiectasis and parenchymal scarring in the right lower lobe. 8. Stable osteopenia and mild loss of height of the T9 and T8 vertebral bodies.   EKG: 06/23/19 ([facility]):  Sinus rhythm with first-degree AV block Right bundle branch block Cannot rule out inferior infarct, age undetermined   CV: Echo 01/27/19: IMPRESSIONS  1. The left ventricle has normal systolic function with an ejection  fraction of 60-65%. The cavity size was  normal. Left ventricular diastolic  Doppler parameters are consistent with impaired relaxation. Elevated left  ventricular end-diastolic pressure  The E/e' is 31.  2. The right ventricle has normal systolic function. The cavity was  mildly enlarged. There is no increase in right ventricular wall thickness.  Right ventricular systolic pressure 36 mmHg.  3. Left atrial size was mildly dilated.  4. The aortic valve is grossly normal. No stenosis of the aortic valve.  5. Tricuspid valve regurgitation is mild-moderate.  6. The inferior vena cava was normal in size with <50% respiratory  Variability.    Long term cardiac monitor 01/28/19-02/11/19: Study Highlights Sinus rhythm with frequent PVC's (11.5%) and periods of bigeminy.    Last cardiac cath seen was on 05/26/09 prior to CABG (3V CAD, preserved EF).   Past Medical History:  Diagnosis Date  . AAA (abdominal aortic aneurysm) ([place])   . Angioedema 06/01/2015  . Arthritis   . Back pain   . 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 Chronic kidney disease    ? bph, pt self catheterizes himself on schedule  . Complication of anesthesia    had to have Foley post op cabg and lumb lam  . Coronary artery disease   . Hypertension   . Thoracic aortic aneurysm ([place])    09/29/16 CT: 4.3 cm ascending, 4.5 cm descending. 1 yr f/u rec  . Urinary retention   . Wears glasses     Past Surgical History:  Procedure Laterality Date  . BACK SURGERY  2012   lumb lam  . CARDIAC CATHETERIZATION     2010-per patient  . COLONOSCOPY    . CORONARY ARTERY BYPASS GRAFT     2010  . EPIGASTRIC HERNIA REPAIR N/A 09/11/2017   Procedure: HERNIA REPAIR EPIGASTRIC ADULT;  Surgeon: Rolm Bookbinder, MD;  Location: [facility];  Service: General;  Laterality: N/A;  . HERNIA REPAIR     rih 1981  . HERNIA REPAIR  09/11/2017   with mesh  . INGUINAL HERNIA REPAIR  07/30/2012   Procedure: HERNIA REPAIR INGUINAL ADULT;   Surgeon: Rolm Bookbinder, MD;  Location: [facility];  Service: General;  Laterality: Left;  Left Hernia Site  . INSERTION OF MESH  07/30/2012   Procedure: INSERTION OF MESH;  Surgeon: Rolm Bookbinder, MD;  Location: [facility];  Service: General;  Laterality: Left;  Left Inguinal Hernia  . INSERTION OF MESH N/A 09/11/2017   Procedure: INSERTION OF MESH;  Surgeon: Rolm Bookbinder, MD;  Location: [facility];  Service: General;  Laterality: N/A;  . LAPAROSCOPY N/A 09/11/2017   Procedure: DIAGNOSTIC LAPAOSCOPY;  Surgeon: Rolm Bookbinder, MD;  Location: [facility];  Service: General;  Laterality: N/A;  ERAS pathway  . SPINE SURGERY     lumbar laminectomy  . TONSILLECTOMY      MEDICATIONS: . aspirin 81 MG tablet  . atorvastatin (LIPITOR) 20 MG tablet  . Cholecalciferol (VITAMIN D3) 1000 units CAPS  . Co-Enzyme Q-10 100 MG CAPS  . DILT-XR  180 MG 24 hr capsule  . ferrous sulfate 325 (65 FE) MG tablet  . gabapentin (NEURONTIN) 300 MG capsule  . Multiple Vitamins-Minerals (PRESERVISION AREDS 2 PO)  . nitroGLYCERIN (NITRODUR - DOSED IN MG/24 HR) 0.2 mg/hr patch  . nitroGLYCERIN (NITROSTAT) 0.6 MG SL tablet  . oxymetazoline (AFRIN) 0.05 % nasal spray  . sodium chloride (MURO 128) 5 % ophthalmic ointment  . triamcinolone (NASACORT ALLERGY 24HR) 55 MCG/ACT AERO nasal inhaler  . vitamin B-12 (CYANOCOBALAMIN) 1000 MCG tablet  . vitamin C (ASCORBIC ACID) 250 MG tablet   No current facility-administered medications for this encounter.    Myra Gianotti, PA-C Surgical Short Stay/Anesthesiology [facility] Phone [phone] [facility] Phone [phone] 09/18/2019 10:46 AM

## 2019-09-19 ENCOUNTER — Other Ambulatory Visit (HOSPITAL_COMMUNITY)
Admission: RE | Admit: 2019-09-19 | Discharge: 2019-09-19 | Disposition: A | Discharge status: Home / Self Care | Payer: Medicare Other | Source: Ambulatory Visit | Attending: Vascular Surgery | Admitting: Vascular Surgery

## 2019-09-19 DIAGNOSIS — Z01812 Encounter for preprocedural laboratory examination: Secondary | ICD-10-CM | POA: Insufficient documentation

## 2019-09-19 DIAGNOSIS — Z20822 Contact with and (suspected) exposure to covid-19: Secondary | ICD-10-CM | POA: Insufficient documentation

## 2019-09-19 LAB — SARS CORONAVIRUS 2 (TAT 6-24 HRS): SARS Coronavirus 2: NEGATIVE

## 2019-09-22 ENCOUNTER — Inpatient Hospital Stay (HOSPITAL_COMMUNITY)
Admission: RE | Admit: 2019-09-22 | Discharge: 2019-09-24 | DRG: 221 | Disposition: A | Discharge status: Home / Self Care | Payer: Medicare Other | Attending: Vascular Surgery | Admitting: Vascular Surgery

## 2019-09-22 ENCOUNTER — Inpatient Hospital Stay (HOSPITAL_COMMUNITY): Payer: Medicare Other | Admitting: Vascular Surgery

## 2019-09-22 ENCOUNTER — Inpatient Hospital Stay (HOSPITAL_COMMUNITY): Payer: Medicare Other

## 2019-09-22 ENCOUNTER — Other Ambulatory Visit: Payer: Self-pay

## 2019-09-22 ENCOUNTER — Encounter (HOSPITAL_COMMUNITY): Payer: Self-pay | Admitting: Vascular Surgery

## 2019-09-22 ENCOUNTER — Encounter (HOSPITAL_COMMUNITY)
Admission: RE | Disposition: A | Discharge status: Home / Self Care | Payer: Self-pay | Source: Home / Self Care | Attending: Vascular Surgery

## 2019-09-22 ENCOUNTER — Inpatient Hospital Stay (HOSPITAL_COMMUNITY): Payer: Medicare Other | Admitting: Anesthesiology

## 2019-09-22 DIAGNOSIS — I739 Peripheral vascular disease, unspecified: Secondary | ICD-10-CM | POA: Diagnosis present

## 2019-09-22 DIAGNOSIS — I10 Essential (primary) hypertension: Secondary | ICD-10-CM | POA: Diagnosis present

## 2019-09-22 DIAGNOSIS — Z87891 Personal history of nicotine dependence: Secondary | ICD-10-CM

## 2019-09-22 DIAGNOSIS — Z7982 Long term (current) use of aspirin: Secondary | ICD-10-CM

## 2019-09-22 DIAGNOSIS — I712 Thoracic aortic aneurysm, without rupture, unspecified: Secondary | ICD-10-CM

## 2019-09-22 DIAGNOSIS — Z79899 Other long term (current) drug therapy: Secondary | ICD-10-CM | POA: Diagnosis not present

## 2019-09-22 DIAGNOSIS — Z7951 Long term (current) use of inhaled steroids: Secondary | ICD-10-CM | POA: Diagnosis not present

## 2019-09-22 DIAGNOSIS — Z8249 Family history of ischemic heart disease and other diseases of the circulatory system: Secondary | ICD-10-CM | POA: Diagnosis not present

## 2019-09-22 DIAGNOSIS — Z20822 Contact with and (suspected) exposure to covid-19: Secondary | ICD-10-CM | POA: Diagnosis present

## 2019-09-22 DIAGNOSIS — I2510 Atherosclerotic heart disease of native coronary artery without angina pectoris: Secondary | ICD-10-CM | POA: Diagnosis present

## 2019-09-22 DIAGNOSIS — Z951 Presence of aortocoronary bypass graft: Secondary | ICD-10-CM | POA: Diagnosis not present

## 2019-09-22 DIAGNOSIS — I714 Abdominal aortic aneurysm, without rupture, unspecified: Secondary | ICD-10-CM

## 2019-09-22 DIAGNOSIS — Z85828 Personal history of other malignant neoplasm of skin: Secondary | ICD-10-CM | POA: Diagnosis not present

## 2019-09-22 HISTORY — PX: THORACIC AORTIC ENDOVASCULAR STENT GRAFT: SHX6112

## 2019-09-22 LAB — BASIC METABOLIC PANEL
Anion gap: 10 (ref 5–15)
BUN: 16 mg/dL (ref 8–23)
CO2: 26 mmol/L (ref 22–32)
Calcium: 8.6 mg/dL — ABNORMAL LOW (ref 8.9–10.3)
Chloride: 104 mmol/L (ref 98–111)
Creatinine, Ser: 0.92 mg/dL (ref 0.61–1.24)
GFR calc Af Amer: >60 mL/min (ref >60)
GFR calc non Af Amer: >60 mL/min (ref >60)
Glucose, Bld: 140 mg/dL — ABNORMAL HIGH (ref 70–99)
Potassium: 3.8 mmol/L (ref 3.5–5.1)
Sodium: 140 mmol/L (ref 135–145)

## 2019-09-22 LAB — CBC
HCT: 36.6 % — ABNORMAL LOW (ref 39.0–52.0)
Hemoglobin: 11.7 g/dL — ABNORMAL LOW (ref 13.0–17.0)
MCH: 29.9 pg (ref 26.0–34.0)
MCHC: 32 g/dL (ref 30.0–36.0)
MCV: 93.6 fL (ref 80.0–100.0)
Platelets: 177 10*3/uL (ref 150–400)
RBC: 3.91 MIL/uL — ABNORMAL LOW (ref 4.22–5.81)
RDW: 14.4 % (ref 11.5–15.5)
WBC: 10.7 10*3/uL — ABNORMAL HIGH (ref 4.0–10.5)
nRBC: 0 % (ref 0.0–0.2)

## 2019-09-22 LAB — URINALYSIS, ROUTINE W REFLEX MICROSCOPIC
Bilirubin Urine: NEGATIVE
Glucose, UA: NEGATIVE mg/dL
Hgb urine dipstick: NEGATIVE
Ketones, ur: NEGATIVE mg/dL
Nitrite: POSITIVE — AB
Protein, ur: NEGATIVE mg/dL
Specific Gravity, Urine: 1.011 (ref 1.005–1.030)
WBC, UA: >50 WBC/hpf — ABNORMAL HIGH (ref 0–5)
pH: 6 (ref 5.0–8.0)

## 2019-09-22 LAB — POCT ACTIVATED CLOTTING TIME
Activated Clotting Time: 202 seconds
Activated Clotting Time: 241 seconds

## 2019-09-22 LAB — PROTIME-INR
INR: 1.2 (ref 0.8–1.2)
Prothrombin Time: 14.7 seconds (ref 11.4–15.2)

## 2019-09-22 LAB — MAGNESIUM: Magnesium: 1.9 mg/dL (ref 1.7–2.4)

## 2019-09-22 LAB — APTT: aPTT: 44 seconds — ABNORMAL HIGH (ref 24–36)

## 2019-09-22 SURGERY — INSERTION, ENDOVASCULAR STENT GRAFT, AORTA, THORACIC
Anesthesia: General | Site: Groin

## 2019-09-22 MED ORDER — SODIUM CHLORIDE 0.9 % IV SOLN
INTRAVENOUS | Status: DC | PRN
  Administered 2019-09-22: 500 mL

## 2019-09-22 MED ORDER — FENTANYL CITRATE (PF) 100 MCG/2ML IJ SOLN: 25.0000 ug | INTRAMUSCULAR | Status: DC | PRN

## 2019-09-22 MED ORDER — LIDOCAINE 2% (20 MG/ML) 5 ML SYRINGE
INTRAMUSCULAR | Status: DC | PRN
  Administered 2019-09-22: 60 mg via INTRAVENOUS

## 2019-09-22 MED ORDER — MIDAZOLAM HCL 2 MG/2ML IJ SOLN
INTRAMUSCULAR | Status: AC
  Filled 2019-09-22: qty 2

## 2019-09-22 MED ORDER — FENTANYL CITRATE (PF) 100 MCG/2ML IJ SOLN
INTRAMUSCULAR | Status: DC | PRN
  Administered 2019-09-22 (×2): 25 ug via INTRAVENOUS

## 2019-09-22 MED ORDER — HYDRALAZINE HCL 20 MG/ML IJ SOLN: 5.0000 mg | INTRAMUSCULAR | Status: DC | PRN

## 2019-09-22 MED ORDER — OXYCODONE-ACETAMINOPHEN 5-325 MG PO TABS
1.0000 | ORAL_TABLET | ORAL | Status: DC | PRN
  Administered 2019-09-22 – 2019-09-23 (×2): 1 via ORAL
  Filled 2019-09-22 (×2): qty 1

## 2019-09-22 MED ORDER — METOPROLOL TARTRATE 5 MG/5ML IV SOLN
5.0000 mg | Freq: Four times a day (QID) | INTRAVENOUS | Status: DC
  Administered 2019-09-22 – 2019-09-24 (×6): 5 mg via INTRAVENOUS
  Filled 2019-09-22 (×6): qty 5

## 2019-09-22 MED ORDER — GUAIFENESIN-DM 100-10 MG/5ML PO SYRP: 15.0000 mL | ORAL_SOLUTION | ORAL | Status: DC | PRN

## 2019-09-22 MED ORDER — ONDANSETRON HCL 4 MG/2ML IJ SOLN
INTRAMUSCULAR | Status: DC | PRN
  Administered 2019-09-22: 4 mg via INTRAVENOUS

## 2019-09-22 MED ORDER — MORPHINE SULFATE (PF) 2 MG/ML IV SOLN: 1.0000 mg | INTRAVENOUS | Status: DC | PRN

## 2019-09-22 MED ORDER — BISACODYL 10 MG RE SUPP: 10.0000 mg | Freq: Every day | RECTAL | Status: DC | PRN

## 2019-09-22 MED ORDER — NITROGLYCERIN IN D5W 200-5 MCG/ML-% IV SOLN
5.0000 ug/min | INTRAVENOUS | Status: DC
  Administered 2019-09-22: 5 ug/min via INTRAVENOUS
  Filled 2019-09-22: qty 250

## 2019-09-22 MED ORDER — POLYETHYLENE GLYCOL 3350 17 G PO PACK: 17.0000 g | PACK | Freq: Every day | ORAL | Status: DC | PRN

## 2019-09-22 MED ORDER — ACETAMINOPHEN 325 MG RE SUPP: 325.0000 mg | RECTAL | Status: DC | PRN

## 2019-09-22 MED ORDER — CHLORHEXIDINE GLUCONATE CLOTH 2 % EX PADS
6.0000 | MEDICATED_PAD | Freq: Every day | CUTANEOUS | Status: DC
  Administered 2019-09-23: 6 via TOPICAL

## 2019-09-22 MED ORDER — IODIXANOL 320 MG/ML IV SOLN
INTRAVENOUS | Status: DC | PRN
  Administered 2019-09-22: 125 mL via INTRA_ARTERIAL

## 2019-09-22 MED ORDER — SODIUM CHLORIDE 0.9 % IV SOLN: INTRAVENOUS | Status: DC

## 2019-09-22 MED ORDER — LACTATED RINGERS IV SOLN: INTRAVENOUS | Status: DC | PRN

## 2019-09-22 MED ORDER — DOCUSATE SODIUM 100 MG PO CAPS
100.0000 mg | ORAL_CAPSULE | Freq: Every day | ORAL | Status: DC
  Administered 2019-09-23 – 2019-09-24 (×2): 100 mg via ORAL
  Filled 2019-09-22 (×2): qty 1

## 2019-09-22 MED ORDER — CHLORHEXIDINE GLUCONATE CLOTH 2 % EX PADS: 6.0000 | MEDICATED_PAD | Freq: Once | CUTANEOUS | Status: DC

## 2019-09-22 MED ORDER — PROPOFOL 10 MG/ML IV BOLUS
INTRAVENOUS | Status: AC
  Filled 2019-09-22: qty 20

## 2019-09-22 MED ORDER — PROPOFOL 10 MG/ML IV BOLUS
INTRAVENOUS | Status: DC | PRN
  Administered 2019-09-22: 80 mg via INTRAVENOUS

## 2019-09-22 MED ORDER — 0.9 % SODIUM CHLORIDE (POUR BTL) OPTIME
TOPICAL | Status: DC | PRN
  Administered 2019-09-22: 11:00:00 1000 mL

## 2019-09-22 MED ORDER — METOPROLOL TARTRATE 5 MG/5ML IV SOLN: 2.0000 mg | INTRAVENOUS | Status: DC | PRN

## 2019-09-22 MED ORDER — SUGAMMADEX SODIUM 500 MG/5ML IV SOLN
INTRAVENOUS | Status: DC | PRN
  Administered 2019-09-22: 200 mg via INTRAVENOUS

## 2019-09-22 MED ORDER — DILTIAZEM HCL ER COATED BEADS 180 MG PO CP24
180.0000 mg | ORAL_CAPSULE | Freq: Every day | ORAL | Status: DC
  Administered 2019-09-23 – 2019-09-24 (×2): 180 mg via ORAL
  Filled 2019-09-22 (×2): qty 1

## 2019-09-22 MED ORDER — ATORVASTATIN CALCIUM 10 MG PO TABS
20.0000 mg | ORAL_TABLET | Freq: Every day | ORAL | Status: DC
  Administered 2019-09-22 – 2019-09-23 (×2): 20 mg via ORAL
  Filled 2019-09-22 (×2): qty 2

## 2019-09-22 MED ORDER — MAGNESIUM SULFATE 2 GM/50ML IV SOLN: 2.0000 g | Freq: Every day | INTRAVENOUS | Status: DC | PRN

## 2019-09-22 MED ORDER — ALUM & MAG HYDROXIDE-SIMETH 200-200-20 MG/5ML PO SUSP: 15.0000 mL | ORAL | Status: DC | PRN

## 2019-09-22 MED ORDER — GABAPENTIN 300 MG PO CAPS
300.0000 mg | ORAL_CAPSULE | Freq: Every day | ORAL | Status: DC
  Administered 2019-09-22 – 2019-09-23 (×2): 300 mg via ORAL
  Filled 2019-09-22 (×2): qty 1

## 2019-09-22 MED ORDER — DEXAMETHASONE SODIUM PHOSPHATE 10 MG/ML IJ SOLN
INTRAMUSCULAR | Status: DC | PRN
  Administered 2019-09-22: 5 mg via INTRAVENOUS

## 2019-09-22 MED ORDER — SODIUM CHLORIDE 0.9 % IV SOLN: 500.0000 mL | Freq: Once | INTRAVENOUS | Status: DC | PRN

## 2019-09-22 MED ORDER — POTASSIUM CHLORIDE CRYS ER 20 MEQ PO TBCR: 20.0000 meq | EXTENDED_RELEASE_TABLET | Freq: Every day | ORAL | Status: DC | PRN

## 2019-09-22 MED ORDER — SODIUM CHLORIDE 0.9 % IV SOLN
INTRAVENOUS | Status: AC
  Filled 2019-09-22: qty 1.2

## 2019-09-22 MED ORDER — LACTATED RINGERS IV SOLN: INTRAVENOUS | Status: DC

## 2019-09-22 MED ORDER — LABETALOL HCL 5 MG/ML IV SOLN
10.0000 mg | INTRAVENOUS | Status: DC | PRN
  Administered 2019-09-24: 10 mg via INTRAVENOUS
  Filled 2019-09-22: qty 4

## 2019-09-22 MED ORDER — FENTANYL CITRATE (PF) 250 MCG/5ML IJ SOLN
INTRAMUSCULAR | Status: AC
  Filled 2019-09-22: qty 5

## 2019-09-22 MED ORDER — ASPIRIN EC 81 MG PO TBEC
81.0000 mg | DELAYED_RELEASE_TABLET | Freq: Every day | ORAL | Status: DC
  Administered 2019-09-23 – 2019-09-24 (×2): 81 mg via ORAL
  Filled 2019-09-22 (×2): qty 1

## 2019-09-22 MED ORDER — EPHEDRINE SULFATE 50 MG/ML IJ SOLN
INTRAMUSCULAR | Status: DC | PRN
  Administered 2019-09-22 (×4): 10 mg via INTRAVENOUS

## 2019-09-22 MED ORDER — FERROUS SULFATE 325 (65 FE) MG PO TABS
325.0000 mg | ORAL_TABLET | Freq: Every day | ORAL | Status: DC
  Administered 2019-09-23 – 2019-09-24 (×2): 325 mg via ORAL
  Filled 2019-09-22 (×2): qty 1

## 2019-09-22 MED ORDER — ACETAMINOPHEN 325 MG PO TABS: 325.0000 mg | ORAL_TABLET | ORAL | Status: DC | PRN

## 2019-09-22 MED ORDER — PANTOPRAZOLE SODIUM 40 MG PO TBEC
40.0000 mg | DELAYED_RELEASE_TABLET | Freq: Every day | ORAL | Status: DC
  Administered 2019-09-22 – 2019-09-24 (×3): 40 mg via ORAL
  Filled 2019-09-22 (×3): qty 1

## 2019-09-22 MED ORDER — ALBUMIN HUMAN 5 % IV SOLN
INTRAVENOUS | Status: AC
  Filled 2019-09-22: qty 250

## 2019-09-22 MED ORDER — ALBUMIN HUMAN 5 % IV SOLN
12.5000 g | Freq: Once | INTRAVENOUS | Status: AC
  Administered 2019-09-22: 13:00:00 12.5 g via INTRAVENOUS

## 2019-09-22 MED ORDER — DILTIAZEM HCL ER 180 MG PO CP24: 180.0000 mg | ORAL_CAPSULE | Freq: Every day | ORAL | Status: DC

## 2019-09-22 MED ORDER — ACETAMINOPHEN 500 MG PO TABS
1000.0000 mg | ORAL_TABLET | Freq: Once | ORAL | Status: AC
  Administered 2019-09-22: 1000 mg via ORAL
  Filled 2019-09-22: qty 2

## 2019-09-22 MED ORDER — ONDANSETRON HCL 4 MG/2ML IJ SOLN: 4.0000 mg | Freq: Four times a day (QID) | INTRAMUSCULAR | Status: DC | PRN

## 2019-09-22 MED ORDER — PHENYLEPHRINE HCL-NACL 10-0.9 MG/250ML-% IV SOLN
INTRAVENOUS | Status: DC | PRN
  Administered 2019-09-22: 25 ug/min via INTRAVENOUS

## 2019-09-22 MED ORDER — PROTAMINE SULFATE 10 MG/ML IV SOLN
INTRAVENOUS | Status: DC | PRN
  Administered 2019-09-22 (×4): 10 mg via INTRAVENOUS

## 2019-09-22 MED ORDER — CEFAZOLIN SODIUM-DEXTROSE 2-4 GM/100ML-% IV SOLN
2.0000 g | INTRAVENOUS | Status: AC
  Administered 2019-09-22: 10:00:00 2 g via INTRAVENOUS
  Filled 2019-09-22: qty 100

## 2019-09-22 MED ORDER — PHENOL 1.4 % MT LIQD: 1.0000 | OROMUCOSAL | Status: DC | PRN

## 2019-09-22 MED ORDER — ROCURONIUM BROMIDE 10 MG/ML (PF) SYRINGE
PREFILLED_SYRINGE | INTRAVENOUS | Status: DC | PRN
  Administered 2019-09-22: 20 mg via INTRAVENOUS
  Administered 2019-09-22: 50 mg via INTRAVENOUS
  Administered 2019-09-22: 20 mg via INTRAVENOUS

## 2019-09-22 MED ORDER — HEPARIN SODIUM (PORCINE) 1000 UNIT/ML IJ SOLN
INTRAMUSCULAR | Status: DC | PRN
  Administered 2019-09-22: 1000 [IU] via INTRAVENOUS
  Administered 2019-09-22: 7000 [IU] via INTRAVENOUS
  Administered 2019-09-22: 3000 [IU] via INTRAVENOUS

## 2019-09-22 MED ORDER — CEFAZOLIN SODIUM-DEXTROSE 2-4 GM/100ML-% IV SOLN
2.0000 g | Freq: Three times a day (TID) | INTRAVENOUS | Status: AC
  Administered 2019-09-22 – 2019-09-23 (×2): 2 g via INTRAVENOUS
  Filled 2019-09-22 (×3): qty 100

## 2019-09-22 SURGICAL SUPPLY — 48 items
CANISTER SUCT 3000ML PPV (MISCELLANEOUS) ×2 IMPLANT
CATH ACCU-VU SIZ PIG 5F 100CM (CATHETERS) ×2 IMPLANT
CATH BALLN TRILOBE 26-42 (BALLOONS) ×2 IMPLANT
CATH BEACON 5 .035 40 KMP TP (CATHETERS) ×1 IMPLANT
CATH BEACON 5 .038 40 KMP TP (CATHETERS) ×1
COVER PROBE W GEL 5X96 (DRAPES) ×2 IMPLANT
DERMABOND ADVANCED (GAUZE/BANDAGES/DRESSINGS) ×1
DERMABOND ADVANCED .7 DNX12 (GAUZE/BANDAGES/DRESSINGS) ×1 IMPLANT
DEVICE CLOSURE PERCLS PRGLD 6F (VASCULAR PRODUCTS) IMPLANT
DEVICE TORQUE KENDALL .025-038 (MISCELLANEOUS) ×2 IMPLANT
DRSG TEGADERM 2-3/8X2-3/4 SM (GAUZE/BANDAGES/DRESSINGS) ×2 IMPLANT
DRYSEAL FLEXSHEATH 22FR 33CM (SHEATH) ×1
ELECT REM PT RETURN 9FT ADLT (ELECTROSURGICAL) ×4
ELECTRODE REM PT RTRN 9FT ADLT (ELECTROSURGICAL) ×2 IMPLANT
GLOVE BIO SURGEON STRL SZ7.5 (GLOVE) ×2 IMPLANT
GLOVE BIOGEL PI IND STRL 8 (GLOVE) ×1 IMPLANT
GLOVE BIOGEL PI INDICATOR 8 (GLOVE) ×1
GOWN STRL REUS W/ TWL LRG LVL3 (GOWN DISPOSABLE) ×3 IMPLANT
GOWN STRL REUS W/ TWL XL LVL3 (GOWN DISPOSABLE) ×1 IMPLANT
GOWN STRL REUS W/TWL LRG LVL3 (GOWN DISPOSABLE) ×3
GOWN STRL REUS W/TWL XL LVL3 (GOWN DISPOSABLE) ×1
GUIDEWIRE ANGLED .035X150CM (WIRE) ×2 IMPLANT
KIT BASIN OR (CUSTOM PROCEDURE TRAY) ×2 IMPLANT
KIT TURNOVER KIT B (KITS) ×2 IMPLANT
NEEDLE PERC 18GX7CM (NEEDLE) IMPLANT
NS IRRIG 1000ML POUR BTL (IV SOLUTION) ×4 IMPLANT
PACK ENDOVASCULAR (PACKS) ×2 IMPLANT
PAD ARMBOARD 7.5X6 YLW CONV (MISCELLANEOUS) ×4 IMPLANT
PERCLOSE PROGLIDE 6F (VASCULAR PRODUCTS)
SHEATH AVANTI 11CM 8FR (SHEATH) IMPLANT
SHEATH DRYSEAL FLEX 22FR 33CM (SHEATH) ×1 IMPLANT
SHEATH PINNACLE 5F 10CM (SHEATH) ×2 IMPLANT
STAPLER VISISTAT 35W (STAPLE) IMPLANT
STENT GRFT THORAC ACS 40X40X20 (Endovascular Graft) ×4 IMPLANT
STOPCOCK MORSE 400PSI 3WAY (MISCELLANEOUS) ×2 IMPLANT
SUT ETHILON 3 0 PS 1 (SUTURE) IMPLANT
SUT MNCRL AB 4-0 PS2 18 (SUTURE) ×2 IMPLANT
SUT PROLENE 5 0 C 1 24 (SUTURE) IMPLANT
SUT VIC AB 2-0 CTX 36 (SUTURE) IMPLANT
SUT VIC AB 3-0 SH 27 (SUTURE)
SUT VIC AB 3-0 SH 27X BRD (SUTURE) IMPLANT
SYR 30ML LL (SYRINGE) IMPLANT
SYR 50ML LL SCALE MARK (SYRINGE) ×2 IMPLANT
TOWEL GREEN STERILE (TOWEL DISPOSABLE) ×2 IMPLANT
TRAY FOLEY MTR SLVR 16FR STAT (SET/KITS/TRAYS/PACK) ×2 IMPLANT
TUBING HIGH PRESSURE 120CM (CONNECTOR) ×2 IMPLANT
WIRE BENTSON .035X145CM (WIRE) ×2 IMPLANT
WIRE STIFF LUNDERQUIST 260CM (WIRE) ×2 IMPLANT

## 2019-09-22 NOTE — Op Note (Signed)
Date: September 22, 2019  Preoperative diagnosis: 5.9 cm descending thoracic aortic aneurysm  Postoperative diagnosis: Same  Procedure: 1.  Ultrasound-guided access with percutanenous closure of the right common femoral artery for percutaneous delivery of thoracic endograft 2.  Thoracic arch aortogram 3.  Endovascular repair of descending thoracic aortic aneurysm without coverage of the left subclavian artery (40 mm x 20 cm Gore TAG x2)   Surgeon: Dr. Marty Heck, MD  Assistant: Dr. Deitra [name], MD  Indication: Patient is an 86 year old male that has been followed by CT surgery for a known thoracic aortic aneurysm and is followed by vascular surgery for known abdominal aortic aneurysm.  Recently on follow-up with CT surgery had increase in the size of his thoracic aortic aneurysm now 5.9 cm and he presents for endovascular repair after risk benefits were discussed and obviously was not a good candidate for open repair.  Findings: Ultrasound-guided access of the right common femoral artery with delivery of two Perclose devices.  48 F sheath placed in right common femoral artery.  Ultimately after planning thoracic aortogram, we stented from just distal to the left subclavian artery all the way to the descending thoracic aorta just above the celiac artery.  This did require using a trilobed balloon at the distal graft site just above the celiac to get good seal.  At the end the case there was no evidence of endoleak and the aneurysm appears excluded.  Anesthesia: General  Details: Patient was taken to the operating room after informed consent was obtained.  Placed on operative table in supine position and general endotracheal anesthesia was induced.  Should be noted that prior to induction of anesthetic the anesthesia team did place lumbar drain.  After he was asleep he did get preoperative antibiotics.  His abdominal wall as well as both groins were prepped and draped in usual sterile  fashion.  Preop timeout was performed.  Initially evaluated the right common femoral artery with ultrasound, it was patent, and image was saved.  Accessed this with a micro access needle placed a microwire and the micro sheath.  I did use 11 blade scalpel to dilate track over the wire once wire was initially placed and passed a hemostat.  We then had some trouble getting the Bentson wire to advance giving a very tortuous iliac on the right and so I placed a short 5 French sheath and then used a short KMP with a Glidewire to get up into the infrarenal aorta.  Over this we then deployed two Perclose devices at 10:00 and 2:00 in the right common femoral artery.  At that point in time I then placed an 8 French sheath in the right common femoral artery.  Patient was given 100 units/kg heparin.  ACT was checked throughout the case to maintain greater than 250.  I then used a long pigtail catheter that was advanced up into the ascending aorta and exchanged for a double curve Lundy for additional support and this straightened out the right iliac.  That point in time we advanced a 41 [place] Gore dry seal sheath in the right common femoral artery and advanced through the iliac into the infrarenal aorta.  We then placed the main body thoracic device through the sheath which was a 40 mm x 20 cm Gore thoracic endograft.  This advanced up to the descending thoracic aorta and then a pigtail catheter was buddy wired and ultimately a limited arch aortogram was obtained in steep 60 degree left oblique and  we marked the takeoff of the left subclavian.  Ultimately the graft was pushed up against the outer curve of the descending thoracic aorta maintaining constant pressure on the graft as well as the wire and the graft was deployed just distal to the left subclavian artery into mid descending thoracic aorta.  That point time we then removed our pigtail catheter and then inserted our second main body device which was also 40 mm x 20 cm  Gore thoracic endograft.  Ultimately we needed to cover to just above the celiac based on preoperative CT planning.  A pigtail catheter was buddy wired again and then using a very steep right anterior oblique images we were able to identify the takeoff of the celiac and SMA.  Should be noted that both of these have high-grade stenosis.  We then subsequently deployed the second graft having good overlap within the initial endograft to prevent any endoleak.  Ultimately the graft landed just above the celiac.  We did push our pigtail catheter back up into the new endograft and shot a distal thoracic aortogram that showed there was an area distal that did not have good wall apposition.  Delivery system subsequently removed for the second piece.  We then used tri-lobed balloon and ultimately angioplasty the distal stent was performed being careful to stay within the stent graft itself.  We then ultimately removed the tri-lobed balloon and pushed a pigtail catheter back up with a buddy wire and then a final arch aortogram and descending aortogram was obtained that showed no evidence of endoleak with excellent inline flow down the stent graft that was widely patent and the aneurysm appears appropriately excluded.  There was preservation of flow in the celiac and SMA.  That point time the we then exchanged for a Bentson wire and the double curve Lindy was removed.  We were careful to pull the 22 French right iliac sheath back and retrograde right iliac arteriogram that showed no dissection and preservation of the right iliac artery.  That point time we held manual pressure over the right groin while removing the 22 French sheath leaving the wire in place.  The two Perclose devices were then tied down with good hemostasis.  Hemodynamics of the patient remained stable.  We subsequently remove the wire and finished deploying the Perclose devices.  We checked both feet and had good signals by Doppler.  Patient was given 40 mg  protamine.  The right groin incision was then closed with a 4-0 Monocryl subcuticular and Dermabond was applied.  He was awakened from anesthesia and was able to wiggle his lower extremities will be taken to PACU in stable condition.  Complication: None  Condition: Stable  Marty Heck, MD [facility] Office: [place]

## 2019-09-22 NOTE — Addendum Note (Signed)
Addendum  created 09/22/19 1553 by Roderic Palau, MD   Child order released for a procedure order, Clinical Note Signed, Delete clinical note, Intraprocedure Blocks edited, Order Canceled from Note, Pend clinical note

## 2019-09-22 NOTE — Anesthesia Procedure Notes (Signed)
Procedure Name: Intubation Date/Time: 09/22/2019 10:13 AM Performed by: Imagene Riches, CRNA Pre-anesthesia Checklist: Patient identified, Emergency Drugs available, Suction available and Patient being monitored Patient Re-evaluated:Patient Re-evaluated prior to induction Oxygen Delivery Method: Circle System Utilized Preoxygenation: Pre-oxygenation with 100% oxygen Induction Type: IV induction Ventilation: Mask ventilation without difficulty Laryngoscope Size: Miller and 2 Grade View: Grade I Tube type: Oral Tube size: 7.5 mm Number of attempts: 1 Airway Equipment and Method: Stylet and Oral airway Placement Confirmation: ETT inserted through vocal cords under direct vision,  positive ETCO2 and breath sounds checked- equal and bilateral Secured at: 21 cm Tube secured with: Tape Dental Injury: Teeth and Oropharynx as per pre-operative assessment

## 2019-09-22 NOTE — Anesthesia Procedure Notes (Deleted)
Anesthesia Procedure Note Lumbar drain: Time 8:35 - 8:45 Drain placed at surgeons request. Sterile prep and drape. 2% Lidocaine for skin infiltration. L3-4 level 14G Tuohy needle inserted. +CSF return Drain catheter inserted without difficulty and left at 9cm at the skin. Wire removed from catheter with free flow of CSF. Dressing applied. Pt tolerated the procedure well.

## 2019-09-22 NOTE — Progress Notes (Signed)
    S/P Endovascular repair of descending thoracic aortic aneurysm without coverage of the left subclavian artery (40 mm x 20 cm Gore TAG x2)   Moving all 4 extremities.  No complaints of pain  Lumbar drain 49 cc OP  He denise chest pain, lumbar and abdominal pain.  Roxy Horseman PA-C

## 2019-09-22 NOTE — Anesthesia Procedure Notes (Signed)
Arterial Line Insertion Start/End3/07/2019 8:45 AM, 09/22/2019 9:00 AM Performed by: Imagene Riches, CRNA, CRNA  Patient location: Pre-op. Preanesthetic checklist: patient identified, IV checked, site marked, risks and benefits discussed, surgical consent, monitors and equipment checked, pre-op evaluation, timeout performed and anesthesia consent Patient sedated Right, radial was placed Catheter size: 20 G Hand hygiene performed  and maximum sterile barriers used  Allen's test indicative of satisfactory collateral circulation Attempts: 1 Procedure performed without using ultrasound guided technique. Following insertion, dressing applied and Biopatch. Post procedure assessment: normal  Patient tolerated the procedure well with no immediate complications.

## 2019-09-22 NOTE — H&P (Signed)
History and Physical Interval Note:  09/22/2019 9:54 AM  Andrew [name]  has presented today for surgery, with the diagnosis of THORACIC AORTIC ANEURYSM WITHOUT RUPTURE.  The various methods of treatment have been discussed with the patient and family. After consideration of risks, benefits and other options for treatment, the patient has consented to  Procedure(s): THORACIC AORTIC ENDOVASCULAR STENT GRAFT (N/A) as a surgical intervention.  The patient's history has been reviewed, patient examined, no change in status, stable for surgery.  I have reviewed the patient's chart and labs.  Questions were answered to the patient's satisfaction.    TEVAR for thoracic aneurysm.  Risks and benefits discussed.  Lumbar drain placed by anesthesia.  Andrew [name]  Patient name: Andrew BROWNRIDGE MRN: [id number] DOB: 10/24/1931 Sex: male  REASON FOR CONSULT: Discuss endovascular repair of thoracic aneurysm  HPI:  Andrew [name] is a 86 y.o. male, with history of hypertension, coronary artery disease status post CABG, remote tobacco abuse (quit 1986), CKD, and known thoracic aneurysm followed by CT surgery that presents as a referral from Dr. Roxan Hockey (CT surgery) to discuss stent repair of thoracic aneurysm. He recently saw Dr. Roxan Hockey for 8-month follow-up of his thoracic aneurysm. He had a CTA chest abdomen pelvis on 08/21/2019 thoracic aneurysm now measures 5.8 cm. Slight increase in abdominal aortic aneurysm to 4.8 cm. Patient states he remains very active. He goes to the gym every day [facility] Very functional. Lives independently with his wife. No chest pain or back pain.      Past Medical History:  Diagnosis Date  . Angioedema 06/01/2015  . Arthritis   . Back pain   . 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 Chronic kidney disease    ? bph, pt self catheterizes himself on schedule  . Complication of anesthesia    had to  have Foley post op cabg and lumb lam  . Coronary artery disease   . Hypertension   . Thoracic aortic aneurysm ([place])    09/29/16 CT: 4.3 cm ascending, 4.5 cm descending. 1 yr f/u rec  . Urinary retention   . Wears glasses         Past Surgical History:  Procedure Laterality Date  . BACK SURGERY  2012   lumb lam  . CARDIAC CATHETERIZATION     2010-per patient  . COLONOSCOPY    . CORONARY ARTERY BYPASS GRAFT     2010  . EPIGASTRIC HERNIA REPAIR N/A 09/11/2017   Procedure: HERNIA REPAIR EPIGASTRIC ADULT; Surgeon: Rolm Bookbinder, MD; Location: [facility]; Service: General; Laterality: N/A;  . HERNIA REPAIR     rih 1981  . HERNIA REPAIR  09/11/2017   with mesh  . INGUINAL HERNIA REPAIR  07/30/2012   Procedure: HERNIA REPAIR INGUINAL ADULT; Surgeon: Rolm Bookbinder, MD; Location: [facility]; Service: General; Laterality: Left; Left Hernia Site  . INSERTION OF MESH  07/30/2012   Procedure: INSERTION OF MESH; Surgeon: Rolm Bookbinder, MD; Location: [facility]; Service: General; Laterality: Left; Left Inguinal Hernia  . INSERTION OF MESH N/A 09/11/2017   Procedure: INSERTION OF MESH; Surgeon: Rolm Bookbinder, MD; Location: [facility]; Service: General; Laterality: N/A;  . LAPAROSCOPY N/A 09/11/2017   Procedure: DIAGNOSTIC LAPAOSCOPY; Surgeon: Rolm Bookbinder, MD; Location: [facility]; Service: General; Laterality: N/A; ERAS pathway  . SPINE SURGERY     lumbar laminectomy  . TONSILLECTOMY  Family History  Problem Relation Age of Onset  . Allergic rhinitis Son   . Coronary artery disease Mother   . Angioedema Neg Hx   . Asthma Neg Hx   . Atopy Neg Hx   . Eczema Neg Hx   . Immunodeficiency Neg Hx   . Urticaria Neg Hx    SOCIAL HISTORY:  Social History        Socioeconomic History  . Marital status: Married    Spouse name: Not on file  . Number of children: Not on file  . Years of education: Not on file  . Highest education level: Not on file   Occupational History  . Not on file  Tobacco Use  . Smoking status: Former Smoker    Packs/day: 1.00    Years: 8.00    Pack years: 8.00    Quit date: 07/24/1984    Years since quitting: 35.1  . Smokeless tobacco: Never Used  Substance and Sexual Activity  . Alcohol use: No  . Drug use: No  . Sexual activity: Not on file  Other Topics Concern  . Not on file  Social History Narrative  . Not on file   Social Determinants of Health      Financial Resource Strain:   . Difficulty of Paying Living Expenses: Not on file  Food Insecurity:   . Worried About Charity fundraiser in the Last Year: Not on file  . Ran Out of Food in the Last Year: Not on file  Transportation Needs:   . Lack of Transportation (Medical): Not on file  . Lack of Transportation (Non-Medical): Not on file  Physical Activity:   . Days of Exercise per Week: Not on file  . Minutes of Exercise per Session: Not on file  Stress:   . Feeling of Stress : Not on file  Social Connections:   . Frequency of Communication with Friends and Family: Not on file  . Frequency of Social Gatherings with Friends and Family: Not on file  . Attends Religious Services: Not on file  . Active Member of Clubs or Organizations: Not on file  . Attends Archivist Meetings: Not on file  . Marital Status: Not on file  Intimate Partner Violence:   . Fear of Current or Ex-Partner: Not on file  . Emotionally Abused: Not on file  . Physically Abused: Not on file  . Sexually Abused: Not on file   No Known Allergies        Current Outpatient Medications  Medication Sig Dispense Refill  . aspirin 81 MG tablet Take 81 mg by mouth daily.    Marland Kitchen atorvastatin (LIPITOR) 20 MG tablet TAKE 1 TABLET ONCE DAILY AT 6 PM. 90 tablet 2  . Cholecalciferol (VITAMIN D3) 1000 units CAPS Take 1,000 Units by mouth daily.    Marland Kitchen Co-Enzyme Q-10 100 MG CAPS Take 100 mg by mouth daily.     Marland Kitchen DILT-XR 180 MG 24 hr capsule TAKE 1 CAPSULE DAILY. 30 capsule  5  . ferrous sulfate 325 (65 FE) MG tablet Take 325 mg by mouth daily with breakfast.    . gabapentin (NEURONTIN) 300 MG capsule Take 300 mg by mouth at bedtime.     . nitroGLYCERIN (NITRODUR - DOSED IN MG/24 HR) 0.2 mg/hr patch Use 1/2 patch daily to the affected area 30 patch 1  . nitroGLYCERIN (NITROSTAT) 0.6 MG SL tablet Place 0.6 mg under the tongue every 5 (five) minutes as needed for chest  pain.    . oxymetazoline (AFRIN) 0.05 % nasal spray Place 1 spray into both nostrils at bedtime as needed for congestion.    . sodium chloride (MURO 128) 5 % ophthalmic ointment Place 1 application into both eyes at bedtime.    . triamcinolone (NASACORT ALLERGY 24HR) 55 MCG/ACT AERO nasal inhaler Place 2 sprays into the nose daily as needed (for allergies).    . vitamin B-12 (CYANOCOBALAMIN) 1000 MCG tablet Take 1,000 mcg by mouth daily.     . vitamin C (ASCORBIC ACID) 250 MG tablet Take 250 mg by mouth daily.     . Multiple Vitamins-Minerals (PRESERVISION AREDS 2 PO) Take 1 capsule by mouth 2 (two) times daily.     No current facility-administered medications for this visit.   REVIEW OF SYSTEMS:  [X]  denotes positive finding, [ ]  denotes negative finding  Cardiac  Comments:  Chest pain or chest pressure:    Shortness of breath upon exertion:    Short of breath when lying flat:    Irregular heart rhythm:        Vascular    Pain in calf, thigh, or hip brought on by ambulation:    Pain in feet at night that wakes you up from your sleep:     Blood clot in your veins:    Leg swelling:         Pulmonary    Oxygen at home:    Productive cough:     Wheezing:         Neurologic    Sudden weakness in arms or legs:     Sudden numbness in arms or legs:     Sudden onset of difficulty speaking or slurred speech:    Temporary loss of vision in one eye:     Problems with dizziness:         Gastrointestinal    Blood in stool:     Vomited blood:         Genitourinary    Burning when urinating:      Blood in urine:        Psychiatric    Major depression:         Hematologic    Bleeding problems:    Problems with blood clotting too easily:        Skin    Rashes or ulcers:        Constitutional    Fever or chills:    PHYSICAL EXAM:     Vitals:   09/02/19 1616  BP: (!) 171/95  Pulse: 72  Resp: 16  Temp: 98.1 F (36.7 C)  TempSrc: Temporal  SpO2: 94%  Weight: 150 lb (68 kg)  Height: 5\' 8"  (1.727 m)   GENERAL: The patient is a well-nourished male, in no acute distress. The vital signs are documented above.  CARDIAC: There is a regular rate and rhythm.  VASCULAR:  Palpable femoral pulses bilaterally  Palpable dorsalis pedis pulses bilaterally  PULMONARY: There is good air exchange bilaterally without wheezing or rales.  ABDOMEN: Soft and non-tender with normal pitched bowel sounds.  MUSCULOSKELETAL: There are no major deformities or cyanosis.  NEUROLOGIC: No focal weakness or paresthesias are detected.  DATA:  I independently reviewed his CTA chest and agree that is thoracic aorta looks degenerated from beyond the left subclavian artery all the way down above the celiac. The greatest dimension I measures of his thoracic aneurysm is 5.8 cm.  Assessment/Plan:  Discussed with Mr. Gable CTA findings  with slight interval increase in his descending thoracic aneurysm now measuring approximately 5.8 cm. He was referred back by Dr. Roxan Hockey who feels he is not a candidate for open repair. He remains very active and independent and exercises daily. He is very interested in having his thoracic aneurysm repaired and we discussed option of stent graft with a transfemoral approach. My biggest concern is given the extent of thoracic aorta that will require coverage from the left subclavian all the way to just above the celiac given a fairly extensive degenerated thoracic aorta. Discussed with him and his wife risk of spinal cord ischemia and paralysis of approximately 2 to 5%. Will  require spinal drain by anesthesia. In addition to risk of anesthesia (MI, stroke, etc)as well as access issues he remains high risk. Discussed alternative option of continued observation. I do think he has option for stent graft repair. He would like to get scheduled for September 22, 2019 and we will get him on the schedule.  Andrew Heck, MD  [facility]  Office: [phone]

## 2019-09-22 NOTE — Anesthesia Postprocedure Evaluation (Signed)
Anesthesia Post Note  Patient: Andrew [name]  Procedure(s) Performed: THORACIC AORTIC ENDOVASCULAR STENT GRAFT (N/A Groin)     Patient location during evaluation: PACU Anesthesia Type: General Level of consciousness: awake and alert Pain management: pain level controlled Vital Signs Assessment: post-procedure vital signs reviewed and stable Respiratory status: spontaneous breathing, nonlabored ventilation, respiratory function stable and patient connected to nasal cannula oxygen Cardiovascular status: blood pressure returned to baseline and stable Postop Assessment: no apparent nausea or vomiting Anesthetic complications: no    Last Vitals:  Vitals:   09/22/19 1325 09/22/19 1340  BP: 106/67 114/73  Pulse: 65 66  Resp: 12 19  Temp:  36.6 C  SpO2: 100% 100%    Last Pain:  Vitals:   09/22/19 1340  TempSrc:   PainSc: 0-No pain                 Millie Forde,W. EDMOND

## 2019-09-22 NOTE — Anesthesia Procedure Notes (Signed)
Lumbar Drain  Patient location during procedure: pre-op Start time: 09/22/2019 8:35 AM End time: 09/22/2019 8:45 AM Reason for block: at surgeon's request Staffing Performed: anesthesiologist  Anesthesiologist: Roderic Palau, MD Preanesthetic Checklist Completed: patient identified, IV checked, site marked, risks and benefits discussed, surgical consent, monitors and equipment checked, pre-op evaluation and timeout performed Lumbar Puncture:  Patient position: sitting Prep: ChloraPrep Patient monitoring: heart rate, cardiac monitor, continuous pulse ox and blood pressure Approach: midline Location: L3-4 Needle Needle type: Tuohy  Needle gauge: 14 G Needle length: 9 cm Catheter type: closed end flexible Catheter at skin depth: 9 cm Assessment Events: cerebrospinal fluid Attempts: 1 CSF: clear Post Procedure: drain attached to lumbar drainage system, site cleaned and sterile dressing applied

## 2019-09-22 NOTE — Transfer of Care (Signed)
Immediate Anesthesia Transfer of Care Note  Patient: OVED PALMBERG  Procedure(s) Performed: THORACIC AORTIC ENDOVASCULAR STENT GRAFT (N/A Groin)  Patient Location: PACU  Anesthesia Type:General  Level of Consciousness: awake, alert  and oriented  Airway & Oxygen Therapy: Patient Spontanous Breathing and Patient connected to nasal cannula oxygen  Post-op Assessment: Report given to RN and Post -op Vital signs reviewed and stable  Post vital signs: Reviewed and stable  Last Vitals:  Vitals Value Taken Time  BP 121/78 09/22/19 1209  Temp    Pulse 65 09/22/19 1215  Resp 12 09/22/19 1215  SpO2 100 % 09/22/19 1215  Vitals shown include unvalidated device data.  Last Pain:  Vitals:   09/22/19 0801  TempSrc:   PainSc: 0-No pain         Complications: No apparent anesthesia complications

## 2019-09-22 NOTE — Op Note (Deleted)
History and Physical Interval Note:  09/22/2019 9:20 AM  Andrew [name]  has presented today for surgery, with the diagnosis of THORACIC AORTIC ANEURYSM WITHOUT RUPTURE.  The various methods of treatment have been discussed with the patient and family. After consideration of risks, benefits and other options for treatment, the patient has consented to  Procedure(s): THORACIC AORTIC ENDOVASCULAR STENT GRAFT (N/A) as a surgical intervention.  The patient's history has been reviewed, patient examined, no change in status, stable for surgery.  I have reviewed the patient's chart and labs.  Questions were answered to the patient's satisfaction.    TEVAR for thoracic aneurysm.  Risks and benefits discussed again.  Lumbar drain by anesthesia given amount of thoracic aorta that requires coverage.  Andrew [name]  Patient name: Andrew [name] MRN: [id number] DOB: 04/07/1932 Sex: male  REASON FOR CONSULT: Discuss endovascular repair of thoracic aneurysm  HPI:  Andrew [name] is a 86 y.o. male, with history of hypertension, coronary artery disease status post CABG, remote tobacco abuse (quit 1986), CKD, and known thoracic aneurysm followed by CT surgery that presents as a referral from Dr. Roxan Hockey (CT surgery) to discuss stent repair of thoracic aneurysm. He recently saw Dr. Roxan Hockey for 53-month follow-up of his thoracic aneurysm. He had a CTA chest abdomen pelvis on 08/21/2019 thoracic aneurysm now measures 5.8 cm. Slight increase in abdominal aortic aneurysm to 4.8 cm. Patient states he remains very active. He goes to the gym every day [facility] [employer] Very functional. Lives independently with his wife. No chest pain or back pain.      Past Medical History:  Diagnosis Date  . Angioedema 06/01/2015  . Arthritis   . Back pain   . 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 Chronic kidney disease    ? bph, pt self catheterizes himself  on schedule  . Complication of anesthesia    had to have Foley post op cabg and lumb lam  . Coronary artery disease   . Hypertension   . Thoracic aortic aneurysm ([place])    09/29/16 CT: 4.3 cm ascending, 4.5 cm descending. 1 yr f/u rec  . Urinary retention   . Wears glasses         Past Surgical History:  Procedure Laterality Date  . BACK SURGERY  2012   lumb lam  . CARDIAC CATHETERIZATION     2010-per patient  . COLONOSCOPY    . CORONARY ARTERY BYPASS GRAFT     2010  . EPIGASTRIC HERNIA REPAIR N/A 09/11/2017   Procedure: HERNIA REPAIR EPIGASTRIC ADULT; Surgeon: Rolm Bookbinder, MD; Location: [facility]; Service: General; Laterality: N/A;  . HERNIA REPAIR     rih 1981  . HERNIA REPAIR  09/11/2017   with mesh  . INGUINAL HERNIA REPAIR  07/30/2012   Procedure: HERNIA REPAIR INGUINAL ADULT; Surgeon: Rolm Bookbinder, MD; Location: [facility]; Service: General; Laterality: Left; Left Hernia Site  . INSERTION OF MESH  07/30/2012   Procedure: INSERTION OF MESH; Surgeon: Rolm Bookbinder, MD; Location: [facility]; Service: General; Laterality: Left; Left Inguinal Hernia  . INSERTION OF MESH N/A 09/11/2017   Procedure: INSERTION OF MESH; Surgeon: Rolm Bookbinder, MD; Location: [facility]; Service: General; Laterality: N/A;  . LAPAROSCOPY N/A 09/11/2017   Procedure: DIAGNOSTIC LAPAOSCOPY; Surgeon: Rolm Bookbinder, MD; Location: [facility]; Service: General; Laterality: N/A; ERAS pathway  . SPINE SURGERY  lumbar laminectomy  . TONSILLECTOMY          Family History  Problem Relation Age of Onset  . Allergic rhinitis Son   . Coronary artery disease Mother   . Angioedema Neg Hx   . Asthma Neg Hx   . Atopy Neg Hx   . Eczema Neg Hx   . Immunodeficiency Neg Hx   . Urticaria Neg Hx    SOCIAL HISTORY:  Social History        Socioeconomic History  . Marital status: Married    Spouse name: Not on file  . Number of children: Not on file  . Years of education:  Not on file  . Highest education level: Not on file  Occupational History  . Not on file  Tobacco Use  . Smoking status: Former Smoker    Packs/day: 1.00    Years: 8.00    Pack years: 8.00    Quit date: 07/24/1984    Years since quitting: 35.1  . Smokeless tobacco: Never Used  Substance and Sexual Activity  . Alcohol use: No  . Drug use: No  . Sexual activity: Not on file  Other Topics Concern  . Not on file  Social History Narrative  . Not on file   Social Determinants of Health      Financial Resource Strain:   . Difficulty of Paying Living Expenses: Not on file  Food Insecurity:   . Worried About Charity fundraiser in the Last Year: Not on file  . Ran Out of Food in the Last Year: Not on file  Transportation Needs:   . Lack of Transportation (Medical): Not on file  . Lack of Transportation (Non-Medical): Not on file  Physical Activity:   . Days of Exercise per Week: Not on file  . Minutes of Exercise per Session: Not on file  Stress:   . Feeling of Stress : Not on file  Social Connections:   . Frequency of Communication with Friends and Family: Not on file  . Frequency of Social Gatherings with Friends and Family: Not on file  . Attends Religious Services: Not on file  . Active Member of Clubs or Organizations: Not on file  . Attends Archivist Meetings: Not on file  . Marital Status: Not on file  Intimate Partner Violence:   . Fear of Current or Ex-Partner: Not on file  . Emotionally Abused: Not on file  . Physically Abused: Not on file  . Sexually Abused: Not on file   No Known Allergies        Current Outpatient Medications  Medication Sig Dispense Refill  . aspirin 81 MG tablet Take 81 mg by mouth daily.    Marland Kitchen atorvastatin (LIPITOR) 20 MG tablet TAKE 1 TABLET ONCE DAILY AT 6 PM. 90 tablet 2  . Cholecalciferol (VITAMIN D3) 1000 units CAPS Take 1,000 Units by mouth daily.    Marland Kitchen Co-Enzyme Q-10 100 MG CAPS Take 100 mg by mouth daily.     Marland Kitchen DILT-XR  180 MG 24 hr capsule TAKE 1 CAPSULE DAILY. 30 capsule 5  . ferrous sulfate 325 (65 FE) MG tablet Take 325 mg by mouth daily with breakfast.    . gabapentin (NEURONTIN) 300 MG capsule Take 300 mg by mouth at bedtime.     . nitroGLYCERIN (NITRODUR - DOSED IN MG/24 HR) 0.2 mg/hr patch Use 1/2 patch daily to the affected area 30 patch 1  . nitroGLYCERIN (NITROSTAT) 0.6 MG SL tablet  Place 0.6 mg under the tongue every 5 (five) minutes as needed for chest pain.    Marland Kitchen oxymetazoline (AFRIN) 0.05 % nasal spray Place 1 spray into both nostrils at bedtime as needed for congestion.    . sodium chloride (MURO 128) 5 % ophthalmic ointment Place 1 application into both eyes at bedtime.    . triamcinolone (NASACORT ALLERGY 24HR) 55 MCG/ACT AERO nasal inhaler Place 2 sprays into the nose daily as needed (for allergies).    . vitamin B-12 (CYANOCOBALAMIN) 1000 MCG tablet Take 1,000 mcg by mouth daily.     . vitamin C (ASCORBIC ACID) 250 MG tablet Take 250 mg by mouth daily.     . Multiple Vitamins-Minerals (PRESERVISION AREDS 2 PO) Take 1 capsule by mouth 2 (two) times daily.     No current facility-administered medications for this visit.   REVIEW OF SYSTEMS:  [X]  denotes positive finding, [ ]  denotes negative finding  Cardiac  Comments:  Chest pain or chest pressure:    Shortness of breath upon exertion:    Short of breath when lying flat:    Irregular heart rhythm:        Vascular    Pain in calf, thigh, or hip brought on by ambulation:    Pain in feet at night that wakes you up from your sleep:     Blood clot in your veins:    Leg swelling:         Pulmonary    Oxygen at home:    Productive cough:     Wheezing:         Neurologic    Sudden weakness in arms or legs:     Sudden numbness in arms or legs:     Sudden onset of difficulty speaking or slurred speech:    Temporary loss of vision in one eye:     Problems with dizziness:         Gastrointestinal    Blood in stool:     Vomited blood:          Genitourinary    Burning when urinating:     Blood in urine:        Psychiatric    Major depression:         Hematologic    Bleeding problems:    Problems with blood clotting too easily:        Skin    Rashes or ulcers:        Constitutional    Fever or chills:    PHYSICAL EXAM:     Vitals:   09/02/19 1616  BP: (!) 171/95  Pulse: 72  Resp: 16  Temp: 98.1 F (36.7 C)  TempSrc: Temporal  SpO2: 94%  Weight: 150 lb (68 kg)  Height: 5\' 8"  (1.727 m)   GENERAL: The patient is a well-nourished male, in no acute distress. The vital signs are documented above.  CARDIAC: There is a regular rate and rhythm.  VASCULAR:  Palpable femoral pulses bilaterally  Palpable dorsalis pedis pulses bilaterally  PULMONARY: There is good air exchange bilaterally without wheezing or rales.  ABDOMEN: Soft and non-tender with normal pitched bowel sounds.  MUSCULOSKELETAL: There are no major deformities or cyanosis.  NEUROLOGIC: No focal weakness or paresthesias are detected.  DATA:  I independently reviewed his CTA chest and agree that is thoracic aorta looks degenerated from beyond the left subclavian artery all the way down above the celiac. The greatest dimension I measures of his  thoracic aneurysm is 5.8 cm.  Assessment/Plan:  Discussed with Mr. Verhagen CTA findings with slight interval increase in his descending thoracic aneurysm now measuring approximately 5.8 cm. He was referred back by Dr. Roxan Hockey who feels he is not a candidate for open repair. He remains very active and independent and exercises daily. He is very interested in having his thoracic aneurysm repaired and we discussed option of stent graft with a transfemoral approach. My biggest concern is given the extent of thoracic aorta that will require coverage from the left subclavian all the way to just above the celiac given a fairly extensive degenerated thoracic aorta. Discussed with him and his wife risk of spinal cord  ischemia and paralysis of approximately 2 to 5%. Will require spinal drain by anesthesia. In addition to risk of anesthesia (MI, stroke, etc)as well as access issues he remains high risk. Discussed alternative option of continued observation. I do think he has option for stent graft repair. He would like to get scheduled for September 22, 2019 and we will get him on the schedule.  Andrew Heck, MD  [facility]  Office: [phone]

## 2019-09-23 ENCOUNTER — Encounter: Payer: Self-pay | Admitting: *Deleted

## 2019-09-23 LAB — BASIC METABOLIC PANEL
Anion gap: 9 (ref 5–15)
BUN: 16 mg/dL (ref 8–23)
CO2: 26 mmol/L (ref 22–32)
Calcium: 8.7 mg/dL — ABNORMAL LOW (ref 8.9–10.3)
Chloride: 103 mmol/L (ref 98–111)
Creatinine, Ser: 1 mg/dL (ref 0.61–1.24)
GFR calc Af Amer: >60 mL/min (ref >60)
GFR calc non Af Amer: >60 mL/min (ref >60)
Glucose, Bld: 117 mg/dL — ABNORMAL HIGH (ref 70–99)
Potassium: 4 mmol/L (ref 3.5–5.1)
Sodium: 138 mmol/L (ref 135–145)

## 2019-09-23 LAB — CBC
HCT: 33.3 % — ABNORMAL LOW (ref 39.0–52.0)
Hemoglobin: 10.9 g/dL — ABNORMAL LOW (ref 13.0–17.0)
MCH: 29.8 pg (ref 26.0–34.0)
MCHC: 32.7 g/dL (ref 30.0–36.0)
MCV: 91 fL (ref 80.0–100.0)
Platelets: 151 10*3/uL (ref 150–400)
RBC: 3.66 MIL/uL — ABNORMAL LOW (ref 4.22–5.81)
RDW: 14.2 % (ref 11.5–15.5)
WBC: 11.9 10*3/uL — ABNORMAL HIGH (ref 4.0–10.5)
nRBC: 0 % (ref 0.0–0.2)

## 2019-09-23 NOTE — Progress Notes (Signed)
Anesthesiology: Rounding on Lumbar drain   POD 1    Drain is clamped, pt with FROM lower extrem, normal neuro exam   VSS, alert and oriented, very conversant, no complaints   Site is clean, dry, with no erythema, drainage, edema, tenderness    Plan: removal of catheter tomorrow, if OK with Dr. Carlis Abbott.      Jenita Seashore, MD

## 2019-09-23 NOTE — Progress Notes (Signed)
[facility] [place]  Subjective  - no complaints.  Neurologically intact overnight in ICU with lumbar drain after TEVAR.   Objective 118/64 64 98 F (36.7 C) (Oral) 10 93%  Intake/Output Summary (Last 24 hours) at 09/23/2019 0808 Last data filed at 09/23/2019 0700 Gross per 24 hour  Intake 1436.42 ml  Output 2428 ml  Net -991.58 ml    Right groin c/d/i Palpable DP pulse BLE Feet warm Feet motor intact  Laboratory Lab Results: Recent Labs    09/22/19 1240 09/23/19 0623  WBC 10.7* 11.9*  HGB 11.7* 10.9*  HCT 36.6* 33.3*  PLT 177 151   BMET Recent Labs    09/22/19 1240 09/23/19 0623  NA 140 138  K 3.8 4.0  CL 104 103  CO2 26 26  GLUCOSE 140* 117*  BUN 16 16  CREATININE 0.92 1.00  CALCIUM 8.6* 8.7*    COAG Lab Results  Component Value Date   INR 1.2 09/22/2019   INR 1.0 09/17/2019   INR 1.30 06/11/2009   No results found for: PTT  Assessment/Planning:  Postop day 1 status post TEVAR for endovascular repair of descending thoracic aortic aneurysm.  Has done well overnight in ICU.  Right groin looks good.  Lumbar drain 107 mL output since placement yesterday.  Remains neurologically intact.  Have instructed ICU to clamp lumbar drain this morning and monitor neurologic exam.  I also talked to anesthesia about removing drain tomorrow assuming he does well with drain clamped today.  Can wean nitro from my standpoint.  Regular diet.  Marty Heck 09/23/2019 8:08 AM --

## 2019-09-24 MED ORDER — OXYCODONE-ACETAMINOPHEN 5-325 MG PO TABS: 1.0000 | ORAL_TABLET | Freq: Four times a day (QID) | ORAL | 0 refills | Status: AC | PRN

## 2019-09-24 MED ORDER — DILTIAZEM HCL ER 180 MG PO CP24: 180.0000 mg | ORAL_CAPSULE | Freq: Every day | ORAL | Status: DC

## 2019-09-24 NOTE — Discharge Summary (Signed)
TEVAR Discharge Summary   Andrew [name] 02/14/1932 86 y.o. male  MRN: [id number]  Admission Date: 09/22/2019  Discharge Date: 09/24/19  Physician: Marty Heck, MD  Admission Diagnosis: Aneurysm of thoracic aorta [facility] [I71.2]  Discharge Day services:   Physical therapy Anesthesia  HPI: Andrew [name] is a 86 y.o. male, with history of hypertension, coronary artery disease status post CABG, remote tobacco abuse (quit 1986), CKD, and known thoracic aneurysm followed by CT surgery that presents as a referral from Dr. Roxan Hockey (CT surgery) to discuss stent repair of thoracic aneurysm. He recently saw Dr. Roxan Hockey for 29-month follow-up of his thoracic aneurysm. He had a CTA chest abdomen pelvis on 08/21/2019 thoracic aneurysm now measures 5.8 cm. Slight increase in abdominal aortic aneurysm to 4.8 cm. Patient states he remains very active. He goes to the gym every day [facility] Very functional. Lives independently with his wife. No chest pain or back pain.   Hospital Course:  The patient was admitted to the hospital and taken to the operating room on 09/22/2019 and underwent a endovascular repair of descending thoracic aortic aneurysm that was 5.9 cm by Dr. Carlis Abbott.  The pt tolerated the procedure well and was transported to the PACU in good condition. He had a spinal drain placed by Anesthesia pre-op  Post op day one he did well. Remained neurologically intact in the CV ICU. His right femoral access without any access site complications. Lumbar drain with 107 mL output. He remained on Nitro over night. His lumbar drain was clamped and neurologically he remained intact with ability to move extremities. He was weaned off of Nitro and his regular diet was resumed  The remainder of the hospital course consisted of increasing mobilization and increasing intake of solids without difficulty. His lumbar drain was removed post op day 2. Site remained  without tenderness or edema. Adequate perfusion of bilateral lower extremities. Neurologically remained intact. He was able to ambulate more with PT. He was instructed to continue his Aspirin and Statin as well as continue his home medications. He will discharge home post op day 2. A follow up appointment with Dr. Carlis Abbott was made for the patient for 4 weeks with CTA chest CBC    Component Value Date/Time   WBC 11.9 (H) 09/23/2019 0623   RBC 3.66 (L) 09/23/2019 0623   HGB 10.9 (L) 09/23/2019 0623   HCT 33.3 (L) 09/23/2019 0623   PLT 151 09/23/2019 0623   MCV 91.0 09/23/2019 0623   MCH 29.8 09/23/2019 0623   MCHC 32.7 09/23/2019 0623   RDW 14.2 09/23/2019 0623   LYMPHSABS 0.5 (L) 09/21/2018 0340   MONOABS 0.3 09/21/2018 0340   EOSABS 0.0 09/21/2018 0340   BASOSABS 0.0 09/21/2018 0340    BMET    Component Value Date/Time   NA 138 09/23/2019 0623   NA 139 01/16/2019 0838   K 4.0 09/23/2019 0623   CL 103 09/23/2019 0623   CO2 26 09/23/2019 0623   GLUCOSE 117 (H) 09/23/2019 0623   BUN 16 09/23/2019 0623   BUN 19 01/16/2019 0838   CREATININE 1.00 09/23/2019 0623   CREATININE 1.32 (H) 08/19/2019 1358   CALCIUM 8.7 (L) 09/23/2019 0623   GFRNONAA >60 09/23/2019 0623   GFRAA >60 09/23/2019 [id number]       Discharge Instructions    Discharge patient   Complete by: As directed    Discharge disposition: 01-Home or Self Care   Discharge patient date:  09/24/2019      Discharge Diagnosis:  Aneurysm of thoracic aorta ([place]) [I71.2]  Secondary Diagnosis: Patient Active Problem List   Diagnosis Date Noted  . Aneurysm of thoracic aorta ([place]) 09/22/2019  . AAA (abdominal aortic aneurysm) without rupture ([place]) 04/29/2019  . Pain in left knee 12/17/2018  . Prepatellar bursitis of left knee 12/17/2018  . Lower urinary tract infectious disease   . Acute UTI 09/20/2018  . Coronary artery disease 09/20/2018  . Urinary retention 09/20/2018  . Thoracic aortic aneurysm without rupture ([place])  07/12/2018  . Bilateral nonexudative age-related macular degeneration 12/24/2017  . Fuchs' corneal dystrophy 12/24/2017  . Pseudophakia of both eyes 12/24/2017  . S/P YAG capsulotomy, bilateral 12/24/2017  . Epigastric hernia 09/11/2017  . ED (erectile dysfunction) of organic origin 02/21/2016  . Allergic reaction 06/01/2015  . Angioedema 06/01/2015  . S/P orthopedic surgery, follow-up exam 02/09/2015  . Tendinosis 12/15/2014  . Spermatocele 03/23/2014  . Spondylolisthesis of lumbar region 03/09/2014  . Essential hypertension 10/28/2013  . Lumbar radiculopathy 10/01/2013  . Spinal stenosis 08/05/2013  . 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/28/2013  . Neurogenic bladder disorder 07/28/2013  . 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 Benign prostate hyperplasia 02/17/2013  . Left varicocele 02/12/2012  . Pain in testicle 02/12/2012   Past Medical History:  Diagnosis Date  . AAA (abdominal aortic aneurysm) ([place])   . Angioedema 06/01/2015  . Arthritis   . Back pain   . Cancer ([place])    squamous cell skin cancer carcinomas removed from hand and face  . Chronic kidney disease    ? bph, pt self catheterizes himself on schedule  . Complication of anesthesia    had to have Foley post op cabg and lumb lam  . Coronary artery disease   . Hypertension   . Thoracic aortic aneurysm ([place])    09/29/16 CT: 4.3 cm ascending, 4.5 cm descending. 1 yr f/u rec  . Urinary retention   . Wears glasses      Allergies as of 09/24/2019      Reactions   Quinolones    Patient has an abdominal aortic aneurysm       Medication List    TAKE these medications   aspirin 81 MG tablet Take 81 mg by mouth daily.   atorvastatin 20 MG tablet Commonly known as: LIPITOR TAKE 1 TABLET ONCE DAILY AT 6 PM. What changed: See the new instructions.   Co-Enzyme Q-10 100 MG Caps Take 100 mg by mouth daily.   Dilt-XR 180 MG 24 hr capsule Generic drug: diltiazem TAKE 1 CAPSULE DAILY. What changed: how much to take   ferrous  sulfate 325 (65 FE) MG tablet Take 325 mg by mouth daily with breakfast.   gabapentin 300 MG capsule Commonly known as: NEURONTIN Take 300 mg by mouth at bedtime.   Nasacort Allergy 24HR 55 MCG/ACT Aero nasal inhaler Generic drug: triamcinolone Place 2 sprays into the nose daily as needed (for allergies).   nitroGLYCERIN 0.6 MG SL tablet Commonly known as: NITROSTAT Place 0.6 mg under the tongue every 5 (five) minutes as needed for chest pain.   nitroGLYCERIN 0.2 mg/hr patch Commonly known as: NITRODUR - Dosed in mg/24 hr Use 1/2 patch daily to the affected area   oxyCODONE-acetaminophen 5-325 MG tablet Commonly known as: Percocet Take 1 tablet by mouth every 6 (six) hours as needed for severe pain.   oxymetazoline 0.05 % nasal spray Commonly known as: AFRIN Place 1 spray into both nostrils at bedtime as needed  for congestion.   PRESERVISION AREDS 2 PO Take 1 capsule by mouth 2 (two) times daily.   sodium chloride 5 % ophthalmic ointment Commonly known as: MURO 0000000 Place 1 application into both eyes at bedtime.   vitamin B-12 1000 MCG tablet Commonly known as: CYANOCOBALAMIN Take 1,000 mcg by mouth daily.   vitamin C 250 MG tablet Commonly known as: ASCORBIC ACID Take 250 mg by mouth daily.   Vitamin D3 25 MCG (1000 UT) Caps Take 1,000 Units by mouth daily.       Discharge Instructions: activity as tolerated. He was instructed to not return to his usual exercise activity until Next week starting 3/8  [facility]  Discharge Instructions Endovascular Aortic Aneurysm Repair  Please refer to the following instructions for your post-procedure care. Your surgeon or Physician Assistant will discuss any changes with you.  Activity  You are encouraged to walk as much as you can. You can slowly return to normal activities but must avoid strenuous activity and heavy lifting until your doctor tells you it's OK. Avoid activities such as  vacuuming or swinging a gold club. It is normal to feel tired for several weeks after your surgery. Do not drive until your doctor gives the OK and you are no longer taking prescription pain medications. It is also normal to have difficulty with sleep habits, eating, and bowel movements after surgery. These will go away with time.  Bathing/Showering  You may shower after you go home. If you have an incision, do not soak in a bathtub, hot tub, or swim until the incision heals completely.  Incision Care  Shower every day. Clean your incision with mild soap and water. Pat the area dry with a clean towel. You do not need a bandage unless otherwise instructed. Do not apply any ointments or creams to your incision. If you clothing is irritating, you may cover your incision with a dry gauze pad.  Diet  Resume your normal diet. There are no special food restrictions following this procedure. A low fat/low cholesterol diet is recommended for all patients with vascular disease. In order to heal from your surgery, it is CRITICAL to get adequate nutrition. Your body requires vitamins, minerals, and protein. Vegetables are the best source of vitamins and minerals. Vegetables also provide the perfect balance of protein. Processed food has little nutritional value, so try to avoid this.  Medications  Resume taking all of your medications unless your doctor or Physician Assistnat tells you not to. If your incision is causing pain, you may take over-the-counter pain relievers such as acetaminophen (Tylenol). If you were prescribed a stronger pain medication, please be aware these medications can cause nausea and constipation. Prevent nausea by taking the medication with a snack or meal. Avoid constipation by drinking plenty of fluids and eating foods with a high amount of fiber, such as fruits, vegetables, and grains. Do not take Tylenol if you are taking prescription pain medications.   Follow up  [place] office  will schedule a follow-up appointment with a C.T. scan 3-4 weeks after your surgery.  Please call us immediately for any of the following conditions  . Severe or worsening pain in your legs or feet or in your abdomen back or chest. . Increased pain, redness, drainage (pus) from your incision sit. . Increased abdominal pain, bloating, nausea, vomiting or persistent diarrhea. . Fever of 101 degrees or higher. . Swelling in your leg (s), .  Reduce your risk  of vascular disease  .Stop smoking. If you would like help call [facility] at 1-800-QUIT-NOW [phone]) or [place] at ([phone]. .Manage your cholesterol .Maintain a desired weight .Control your diabetes .Keep your blood pressure down  If you have questions, please call the office at [phone].    Prescriptions given: Oxycodone- Acetaminophen #15 every 6 hours as needed for severe pain. No Refill  Disposition: home  Patient's condition: is Excellent  Follow up: 1. Dr. Carlis Abbott in 4 weeks with CTA protocol   Karoline Caldwell, PA-C [facility] [phone] 09/24/2019  1:43 PM   - For VQI Registry use - Post-op:  Time to Extubation: []  In OR, [ x] < 12 hrs, [ ]  12-24 hrs, [ ]  >=24 hrs Vasopressors Req. Post-op: No MI: No., [ ]  Troponin only, [ ]  EKG or Clinical New Arrhythmia: No CHF: No ICU Stay: 2 day in CVICU Transfusion: NO  Complications: Resp failure: No., [ ]  Pneumonia, [ ]  Ventilator Chg in renal function: No., [ ]  Inc. Cr > 0.5, [ ]  Temp. Dialysis,  [ ]  Permanent dialysis Leg ischemia: No., no Surgery needed, [ ]  Yes, Surgery needed,  [ ]  Amputation Bowel ischemia: No., [ ]  Medical Rx, [ ]  Surgical Rx Wound complication: No., [ ]  Superficial separation/infection, [ ]  Return to OR Return to OR: No  Return to OR for bleeding: No Stroke: No., [ ]  Minor, [ ]  Major  Discharge medications: Statin use:  Yes  ASA use:  Yes  Plavix use:  No  Beta blocker use:  No  ARB use:   No ACEI use:  No CCB use:  Yes

## 2019-09-24 NOTE — Progress Notes (Addendum)
  Progress Note    09/24/2019 8:21 AM 2 Days Post-Op  Subjective: no complaints overnight, sitting up in chair eating breakast   Vitals:   09/24/19 0700 09/24/19 0814  BP: (!) 146/92   Pulse: (!) 119   Resp: 12   Temp:  97.8 F (36.6 C)  SpO2: 92%    Physical Exam: General: Well appearing, well nourished, not in any pain Lungs: non labored Extremities: Right femoral access c/d/i, soft, no hematoma, no ecchymosis or erythema. Moving all extremities. Palpable DP bilaterally, 2+ right femoral pulse Neurologic: Alert and oriented, neurologically intact, no weakness or paresthesias  CBC    Component Value Date/Time   WBC 11.9 (H) 09/23/2019 0623   RBC 3.66 (L) 09/23/2019 0623   HGB 10.9 (L) 09/23/2019 0623   HCT 33.3 (L) 09/23/2019 0623   PLT 151 09/23/2019 0623   MCV 91.0 09/23/2019 0623   MCH 29.8 09/23/2019 0623   MCHC 32.7 09/23/2019 0623   RDW 14.2 09/23/2019 0623   LYMPHSABS 0.5 (L) 09/21/2018 0340   MONOABS 0.3 09/21/2018 0340   EOSABS 0.0 09/21/2018 0340   BASOSABS 0.0 09/21/2018 0340    BMET    Component Value Date/Time   NA 138 09/23/2019 0623   NA 139 01/16/2019 0838   K 4.0 09/23/2019 0623   CL 103 09/23/2019 0623   CO2 26 09/23/2019 0623   GLUCOSE 117 (H) 09/23/2019 0623   BUN 16 09/23/2019 0623   BUN 19 01/16/2019 0838   CREATININE 1.00 09/23/2019 0623   CREATININE 1.32 (H) 08/19/2019 1358   CALCIUM 8.7 (L) 09/23/2019 0623   GFRNONAA >60 09/23/2019 0623   GFRAA >60 09/23/2019 0623    INR    Component Value Date/Time   INR 1.2 09/22/2019 1240     Intake/Output Summary (Last 24 hours) at 09/24/2019 [id number] Last data filed at 09/24/2019 0600 Gross per 24 hour  Intake 362.8 ml  Output 1834 ml  Net -1471.2 ml     Assessment/Plan:  86 y.o. male is s/p TEVAR 2 Days Post-Op for descending thoracic aortic aneurysm. He has done excellent overnight. Tolerating diet. Right groin is c/d/i. Lumbar drain was clamped yesterday and no issues overnight.  Neurologically intact. Anesthesia will remove drain today. Continue Aspirin and Statin. He has not yet ambulated, he will work with PT this morning and if doing well can discharge later today. He will follow up with Dr. Carlis Abbott in 4 weeks with CT Chest   Karoline Caldwell, [place] [facility] [phone] 09/24/2019 8:21 AM   I have seen and evaluated the patient. I agree with the PA note as documented above. POD#2 s/p TEVAR for repair of descending thoracic aneurysm.  Has done well overnight with lumbar drain clamped and neuro intact.  HDS.  Palpable femoral and pedal pulses.  Right groin looks good.  Will ask anesthesiology to remove lumbar drain and d/c foley.  Plan to d/c home today if does ok walkingin hall.  F/U 4 weeks CTA chest with me in clinic.  Marty Heck, MD [facility] [place] Office: [phone]

## 2019-09-24 NOTE — Progress Notes (Signed)
Anesthesiology: Lumbar drain POD 2  Dressing clean, dry. Patient has no complaints. Alert and oriented. Moves LE well.  Lumbar drain removed at surgeons request. Site looks good without edema or tenderness.  Plan is to walk this morning and be discharged later this afternoon.  Will sign off now. Thanks  E. Ola Spurr, MD

## 2019-09-24 NOTE — Discharge Instructions (Signed)
   [facility]   Discharge Instructions  Endovascular Aortic Aneurysm Repair  Please refer to the following instructions for your post-procedure care. Your surgeon or Physician Assistant will discuss any changes with you.  Activity  You are encouraged to walk as much as you can. You can slowly return to normal activities but must avoid strenuous activity and heavy lifting until your doctor tells you it's OK. Avoid activities such as vacuuming or swinging a gold club. It is normal to feel tired for several weeks after your surgery. Do not drive until your doctor gives the OK and you are no longer taking prescription pain medications. It is also normal to have difficulty with sleep habits, eating, and bowel movements after surgery. These will go away with time.  Bathing/Showering  You may shower after you go home. If you have an incision, do not soak in a bathtub, hot tub, or swim until the incision heals completely.  Incision Care  Shower every day. Clean your right groin access site with mild soap and water. Pat the area dry with a clean towel. You do not need a bandage unless otherwise instructed. Do not apply any ointments or creams to your incision. If you clothing is irritating, you may cover your incision with a dry gauze pad.  Diet  Resume your normal diet. There are no special food restrictions following this procedure. A low fat/low cholesterol diet is recommended for all patients with vascular disease. In order to heal from your surgery, it is CRITICAL to get adequate nutrition. Your body requires vitamins, minerals, and protein. Vegetables are the best source of vitamins and minerals. Vegetables also provide the perfect balance of protein. Processed food has little nutritional value, so try to avoid this.  Medications  Resume taking all of your medications unless your doctor or nurse practitioner tells you not to. If your incision is causing pain,  you may take over-the-counter pain relievers such as acetaminophen (Tylenol). If you were prescribed a stronger pain medication, please be aware these medications can cause nausea and constipation. Prevent nausea by taking the medication with a snack or meal. Avoid constipation by drinking plenty of fluids and eating foods with a high amount of fiber, such as fruits, vegetables, and grains. Do not take Tylenol if you are taking prescription pain medications.   Follow up  [place] office will schedule a follow-up appointment with a C.T. scan 3-4 weeks after your surgery.  Please call us immediately for any of the following conditions  Severe or worsening pain in your legs or feet or in your abdomen back or chest. Increased pain, redness, drainage (pus) from your incision sit. Increased abdominal pain, bloating, nausea, vomiting or persistent diarrhea. Fever of 101 degrees or higher. Swelling in your leg (s),  Reduce your risk of vascular disease  .Stop smoking. If you would like help call [facility] at 1-800-QUIT-NOW 6120915373) or [place] at [phone]. .Manage your cholesterol .Maintain a desired weight .Control your diabetes .Keep your blood pressure down  If you have questions, please call the office at [phone].

## 2019-09-24 NOTE — Addendum Note (Signed)
Addendum  created 09/24/19 0909 by Roderic Palau, MD   Clinical Note Signed

## 2019-09-26 ENCOUNTER — Emergency Department (HOSPITAL_COMMUNITY): Payer: Medicare Other

## 2019-09-26 ENCOUNTER — Telehealth: Payer: Self-pay

## 2019-09-26 ENCOUNTER — Other Ambulatory Visit: Payer: Self-pay

## 2019-09-26 ENCOUNTER — Encounter (HOSPITAL_COMMUNITY): Payer: Self-pay | Admitting: Emergency Medicine

## 2019-09-26 ENCOUNTER — Observation Stay (HOSPITAL_COMMUNITY)
Admission: EM | Admit: 2019-09-26 | Discharge: 2019-09-27 | Disposition: A | Discharge status: Home / Self Care | Payer: Medicare Other | Attending: Family Medicine | Admitting: Family Medicine

## 2019-09-26 DIAGNOSIS — Z85828 Personal history of other malignant neoplasm of skin: Secondary | ICD-10-CM | POA: Insufficient documentation

## 2019-09-26 DIAGNOSIS — I2510 Atherosclerotic heart disease of native coronary artery without angina pectoris: Secondary | ICD-10-CM | POA: Diagnosis not present

## 2019-09-26 DIAGNOSIS — Z87891 Personal history of nicotine dependence: Secondary | ICD-10-CM | POA: Insufficient documentation

## 2019-09-26 DIAGNOSIS — Z20822 Contact with and (suspected) exposure to covid-19: Secondary | ICD-10-CM | POA: Insufficient documentation

## 2019-09-26 DIAGNOSIS — Z7982 Long term (current) use of aspirin: Secondary | ICD-10-CM | POA: Insufficient documentation

## 2019-09-26 DIAGNOSIS — I712 Thoracic aortic aneurysm, without rupture, unspecified: Secondary | ICD-10-CM

## 2019-09-26 DIAGNOSIS — I10 Essential (primary) hypertension: Secondary | ICD-10-CM | POA: Diagnosis present

## 2019-09-26 DIAGNOSIS — I998 Other disorder of circulatory system: Secondary | ICD-10-CM

## 2019-09-26 DIAGNOSIS — R079 Chest pain, unspecified: Principal | ICD-10-CM | POA: Diagnosis present

## 2019-09-26 DIAGNOSIS — I714 Abdominal aortic aneurysm, without rupture: Secondary | ICD-10-CM | POA: Diagnosis not present

## 2019-09-26 DIAGNOSIS — Z79899 Other long term (current) drug therapy: Secondary | ICD-10-CM | POA: Insufficient documentation

## 2019-09-26 DIAGNOSIS — R0782 Intercostal pain: Secondary | ICD-10-CM

## 2019-09-26 HISTORY — DX: Ventricular premature depolarization: I49.3

## 2019-09-26 LAB — BASIC METABOLIC PANEL
Anion gap: 11 (ref 5–15)
BUN: 18 mg/dL (ref 8–23)
CO2: 27 mmol/L (ref 22–32)
Calcium: 9.2 mg/dL (ref 8.9–10.3)
Chloride: 100 mmol/L (ref 98–111)
Creatinine, Ser: 1.16 mg/dL (ref 0.61–1.24)
GFR calc Af Amer: >60 mL/min (ref >60)
GFR calc non Af Amer: 57 mL/min — ABNORMAL LOW (ref >60)
Glucose, Bld: 108 mg/dL — ABNORMAL HIGH (ref 70–99)
Potassium: 4 mmol/L (ref 3.5–5.1)
Sodium: 138 mmol/L (ref 135–145)

## 2019-09-26 LAB — CBC
HCT: 38.3 % — ABNORMAL LOW (ref 39.0–52.0)
HCT: 38.5 % — ABNORMAL LOW (ref 39.0–52.0)
Hemoglobin: 12 g/dL — ABNORMAL LOW (ref 13.0–17.0)
Hemoglobin: 12.3 g/dL — ABNORMAL LOW (ref 13.0–17.0)
MCH: 29.5 pg (ref 26.0–34.0)
MCH: 29.6 pg (ref 26.0–34.0)
MCHC: 31.3 g/dL (ref 30.0–36.0)
MCHC: 31.9 g/dL (ref 30.0–36.0)
MCV: 94.1 fL (ref 80.0–100.0)
MCV: 92.5 fL (ref 80.0–100.0)
Platelets: 142 10*3/uL — ABNORMAL LOW (ref 150–400)
Platelets: 148 10*3/uL — ABNORMAL LOW (ref 150–400)
RBC: 4.07 MIL/uL — ABNORMAL LOW (ref 4.22–5.81)
RBC: 4.16 MIL/uL — ABNORMAL LOW (ref 4.22–5.81)
RDW: 14.3 % (ref 11.5–15.5)
RDW: 14.2 % (ref 11.5–15.5)
WBC: 9.4 10*3/uL (ref 4.0–10.5)
WBC: 9.7 10*3/uL (ref 4.0–10.5)
nRBC: 0 % (ref 0.0–0.2)
nRBC: 0 % (ref 0.0–0.2)

## 2019-09-26 LAB — CREATININE, SERUM
Creatinine, Ser: 0.96 mg/dL (ref 0.61–1.24)
GFR calc Af Amer: >60 mL/min (ref >60)
GFR calc non Af Amer: >60 mL/min (ref >60)

## 2019-09-26 LAB — TROPONIN I (HIGH SENSITIVITY)
Troponin I (High Sensitivity): 8 ng/L (ref <18)
Troponin I (High Sensitivity): 7 ng/L (ref <18)
Troponin I (High Sensitivity): 22 ng/L — ABNORMAL HIGH (ref <18)

## 2019-09-26 MED ORDER — VITAMIN B-12 1000 MCG PO TABS
1000.0000 ug | ORAL_TABLET | Freq: Every day | ORAL | Status: DC
  Administered 2019-09-27: 1000 ug via ORAL
  Filled 2019-09-26: qty 1

## 2019-09-26 MED ORDER — FERROUS SULFATE 325 (65 FE) MG PO TABS
325.0000 mg | ORAL_TABLET | Freq: Every day | ORAL | Status: DC
  Administered 2019-09-27: 325 mg via ORAL
  Filled 2019-09-26: qty 1

## 2019-09-26 MED ORDER — NITROGLYCERIN 0.4 MG SL SUBL: 0.4000 mg | SUBLINGUAL_TABLET | SUBLINGUAL | Status: DC | PRN

## 2019-09-26 MED ORDER — DILTIAZEM HCL ER COATED BEADS 180 MG PO CP24
180.0000 mg | ORAL_CAPSULE | Freq: Every day | ORAL | Status: DC
  Administered 2019-09-27: 180 mg via ORAL
  Filled 2019-09-26 (×2): qty 1

## 2019-09-26 MED ORDER — ATORVASTATIN CALCIUM 10 MG PO TABS: 20.0000 mg | ORAL_TABLET | Freq: Every day | ORAL | Status: DC

## 2019-09-26 MED ORDER — ASPIRIN 81 MG PO CHEW: 81.0000 mg | CHEWABLE_TABLET | Freq: Every day | ORAL | Status: DC

## 2019-09-26 MED ORDER — ACETAMINOPHEN 650 MG RE SUPP: 650.0000 mg | Freq: Four times a day (QID) | RECTAL | Status: DC | PRN

## 2019-09-26 MED ORDER — ENOXAPARIN SODIUM 40 MG/0.4ML ~~LOC~~ SOLN
40.0000 mg | SUBCUTANEOUS | Status: DC
  Administered 2019-09-26: 40 mg via SUBCUTANEOUS
  Filled 2019-09-26: qty 0.4

## 2019-09-26 MED ORDER — ONDANSETRON HCL 4 MG PO TABS: 4.0000 mg | ORAL_TABLET | Freq: Four times a day (QID) | ORAL | Status: DC | PRN

## 2019-09-26 MED ORDER — GABAPENTIN 300 MG PO CAPS: 300.0000 mg | ORAL_CAPSULE | Freq: Every day | ORAL | Status: DC

## 2019-09-26 MED ORDER — ACETAMINOPHEN 325 MG PO TABS: 650.0000 mg | ORAL_TABLET | Freq: Four times a day (QID) | ORAL | Status: DC | PRN

## 2019-09-26 MED ORDER — ONDANSETRON HCL 4 MG/2ML IJ SOLN: 4.0000 mg | Freq: Four times a day (QID) | INTRAMUSCULAR | Status: DC | PRN

## 2019-09-26 MED ORDER — IOHEXOL 350 MG/ML SOLN
100.0000 mL | Freq: Once | INTRAVENOUS | Status: AC | PRN
  Administered 2019-09-26: 100 mL via INTRAVENOUS

## 2019-09-26 MED ORDER — OXYCODONE-ACETAMINOPHEN 5-325 MG PO TABS: 1.0000 | ORAL_TABLET | Freq: Four times a day (QID) | ORAL | Status: DC | PRN

## 2019-09-26 NOTE — ED Notes (Signed)
Pt reports using coude catheters at home. Pt self cathed with coude catheter successfully.

## 2019-09-26 NOTE — ED Triage Notes (Signed)
Per [facility] pt coming from home c/o chest pain x 2 days that has been intermittent. Over the past day pain has been more constant and sharp pressure. Took 1 nitro at home and given 324 aspirin PTA. States had 2 stents placed last week.

## 2019-09-26 NOTE — ED Notes (Signed)
Wife [name] would like an update. [phone]

## 2019-09-26 NOTE — H&P (Addendum)
History and Physical    TODDRICK GAUL [id number] DOB: 07/09/1932 DOA: 09/26/2019  PCP: Carlyle Basques, MD   Patient coming from: Home.  Chief Complaint: Chest pain.  HPI: Andrew [name] is a 86 y.o. male with history of CAD status post CABG who has underwent recent endovascular repair of descending thoracic aortic aneurysm on March 1 presents to the ER with complaints of chest pain.  Patient states since discharge last 2 days patient has been having chest pain which initially was intermittent became more persistent today and presents to the ER.  Denies any associated shortness of breath productive cough fever or chills.  Denies any abdominal pain nausea or vomiting.  ED Course: EMS was called and patient was brought to the ER.  Initially aspirin was given and following giving sublingual nitroglycerin patient's chest pain resolved.  EKG shows normal sinus rhythm.  Since patient had recent endovascular repair of descending thoracic aortic aneurysm CT angiogram of the chest abdomen and pelvis was done which does not show any leak and Dr. Carlis Abbott vascular surgeon evaluated and at this time concern is for possible ACS rule out.  Covid test was negative.  Labs show hemoglobin around 12 WBC 9.4 high sensitive troponin initially was 8 and 7.  Review of Systems: As per HPI, rest all negative.   Past Medical History:  Diagnosis Date  . AAA (abdominal aortic aneurysm) ([place])   . Angioedema 06/01/2015  . Arthritis   . Back pain   . 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 Chronic kidney disease    ? bph, pt self catheterizes himself on schedule  . Complication of anesthesia    had to have Foley post op cabg and lumb lam  . Coronary artery disease   . Hypertension   . Thoracic aortic aneurysm ([place])    09/29/16 CT: 4.3 cm ascending, 4.5 cm descending. 1 yr f/u rec  . Urinary retention   . Wears glasses     Past Surgical History:  Procedure  Laterality Date  . BACK SURGERY  2012   lumb lam  . CARDIAC CATHETERIZATION     2010-per patient  . COLONOSCOPY    . CORONARY ARTERY BYPASS GRAFT     2010  . EPIGASTRIC HERNIA REPAIR N/A 09/11/2017   Procedure: HERNIA REPAIR EPIGASTRIC ADULT;  Surgeon: Rolm Bookbinder, MD;  Location: [facility];  Service: General;  Laterality: N/A;  . HERNIA REPAIR     rih 1981  . HERNIA REPAIR  09/11/2017   with mesh  . INGUINAL HERNIA REPAIR  07/30/2012   Procedure: HERNIA REPAIR INGUINAL ADULT;  Surgeon: Rolm Bookbinder, MD;  Location: [facility];  Service: General;  Laterality: Left;  Left Hernia Site  . INSERTION OF MESH  07/30/2012   Procedure: INSERTION OF MESH;  Surgeon: Rolm Bookbinder, MD;  Location: [facility];  Service: General;  Laterality: Left;  Left Inguinal Hernia  . INSERTION OF MESH N/A 09/11/2017   Procedure: INSERTION OF MESH;  Surgeon: Rolm Bookbinder, MD;  Location: [facility];  Service: General;  Laterality: N/A;  . LAPAROSCOPY N/A 09/11/2017   Procedure: DIAGNOSTIC LAPAOSCOPY;  Surgeon: Rolm Bookbinder, MD;  Location: [facility];  Service: General;  Laterality: N/A;  ERAS pathway  . SPINE SURGERY     lumbar laminectomy  . THORACIC AORTIC ENDOVASCULAR STENT GRAFT N/A 09/22/2019   Procedure: THORACIC AORTIC ENDOVASCULAR STENT GRAFT;  Surgeon: Monica Martinez  J, MD;  Location: [facility];  Service: Vascular;  Laterality: N/A;  . TONSILLECTOMY       reports that he quit smoking about 35 years ago. He has a 8.00 pack-year smoking history. He has never used smokeless tobacco. He reports that he does not drink alcohol or use drugs.  Allergies  Allergen Reactions  . Quinolones Other (See Comments)    Patient has an abdominal aortic aneurysm     Family History  Problem Relation Age of Onset  . Allergic rhinitis Son   . Coronary artery disease Mother   . Angioedema Neg Hx   . Asthma Neg Hx   . Atopy Neg Hx   . Eczema Neg Hx   . Immunodeficiency Neg Hx   .  Urticaria Neg Hx     Prior to Admission medications   Medication Sig Start Date End Date Taking? Authorizing Provider  aspirin 81 MG tablet Take 81 mg by mouth daily after supper.    Yes [provider]  atorvastatin (LIPITOR) 20 MG tablet TAKE 1 TABLET ONCE DAILY AT 6 PM. Patient taking differently: Take 20 mg by mouth daily at 6 PM.  02/18/19  Yes Hilty, Nadean Corwin, MD  Cholecalciferol (VITAMIN D3) 1000 units CAPS Take 1,000 Units by mouth daily.   Yes [provider]  Co-Enzyme Q-10 100 MG CAPS Take 100 mg by mouth daily.    Yes [provider]  CRANBERRY EXTRACT PO Take 1 tablet by mouth daily.   Yes [provider]  DILT-XR 180 MG 24 hr capsule TAKE 1 CAPSULE DAILY. Patient taking differently: Take 180 mg by mouth daily.  07/21/19  Yes Hilty, Nadean Corwin, MD  ferrous sulfate 325 (65 FE) MG tablet Take 325 mg by mouth daily with breakfast.   Yes [provider]  gabapentin (NEURONTIN) 300 MG capsule Take 300 mg by mouth at bedtime.  04/18/12  Yes [provider]  ibuprofen (ADVIL) 200 MG tablet Take 200-400 mg by mouth every 6 (six) hours as needed (for pain).   Yes [provider]  loratadine (CLARITIN) 10 MG tablet Take 10 mg by mouth daily as needed for allergies or rhinitis.   Yes [provider]  Multiple Vitamins-Minerals (PRESERVISION AREDS 2 PO) Take 1 capsule by mouth 2 (two) times daily.   Yes [provider]  nitroGLYCERIN (NITROSTAT) 0.6 MG SL tablet Place 0.6 mg under the tongue every 5 (five) minutes as needed for chest pain.   Yes [provider]  oxyCODONE-acetaminophen (PERCOCET) 5-325 MG tablet Take 1 tablet by mouth every 6 (six) hours as needed for severe pain. 09/24/19 09/23/20 Yes Baglia, Corrina, PA-C  oxymetazoline (AFRIN) 0.05 % nasal spray Place 1 spray into both nostrils at bedtime as needed for congestion.   Yes [provider]  sodium chloride (MURO 128) 5 % ophthalmic  ointment Place 1 application into both eyes at bedtime.   Yes [provider]  triamcinolone (NASACORT ALLERGY 24HR) 55 MCG/ACT AERO nasal inhaler Place 2 sprays into the nose daily as needed (for allergies).    Yes [provider]  TURMERIC PO Take 1 capsule by mouth daily after breakfast.   Yes [provider]  vitamin B-12 (CYANOCOBALAMIN) 1000 MCG tablet Take 1,000 mcg by mouth daily.    Yes [provider]  vitamin C (ASCORBIC ACID) 250 MG tablet Take 250 mg by mouth daily.    Yes [provider]  nitroGLYCERIN (NITRODUR - DOSED IN MG/24 HR)  0.2 mg/hr patch Use 1/2 patch daily to the affected area Patient not taking: Reported on 09/26/2019 10/11/18   Dene Gentry, MD    Physical Exam: Constitutional: Moderately built and nourished. Vitals:   09/26/19 1730 09/26/19 1900 09/26/19 1930 09/26/19 2000  BP: (!) 147/93 (!) 152/94 (!) 142/94 (!) 144/86  Pulse: 73 73  72  Resp: 11 12 13 15   Temp:      TempSrc:      SpO2: 96% 96% 94% 95%  Weight:      Height:       Eyes: Nonicteric no pallor. ENMT: No discharge from the ears eyes nose or mouth. Neck: No mass felt.  No neck rigidity. Respiratory: No rhonchi or crepitations. Cardiovascular: S1-S2 heard. Abdomen: Soft nontender bowel sound present. Musculoskeletal: No edema. Skin: No rash. Neurologic: Alert awake oriented time place and person.  Moves all extremities. Psychiatric: Appears normal with normal affect.   Labs on Admission: I have personally reviewed following labs and imaging studies  CBC: Recent Labs  Lab 09/22/19 1240 09/23/19 0623 09/26/19 1300  WBC 10.7* 11.9* 9.4  HGB 11.7* 10.9* 12.0*  HCT 36.6* 33.3* 38.3*  MCV 93.6 91.0 94.1  PLT 177 151 [id number]   Basic Metabolic Panel: Recent Labs  Lab 09/22/19 1240 09/23/19 0623 09/26/19 1300  NA 140 138 138  K 3.8 4.0 4.0  CL 104 103 100  CO2 26 26 27   GLUCOSE 140* 117* 108*  BUN 16 16 18   CREATININE 0.92 1.00 1.16    CALCIUM 8.6* 8.7* 9.2  MG 1.9  --   --    GFR: Estimated Creatinine Clearance: 43.2 mL/min (by C-G formula based on SCr of 1.16 mg/dL). Liver Function Tests: No results for input(s): AST, ALT, ALKPHOS, BILITOT, PROT, ALBUMIN in the last 168 hours. No results for input(s): LIPASE, AMYLASE in the last 168 hours. No results for input(s): AMMONIA in the last 168 hours. Coagulation Profile: Recent Labs  Lab 09/22/19 1240  INR 1.2   Cardiac Enzymes: No results for input(s): CKTOTAL, CKMB, CKMBINDEX, TROPONINI in the last 168 hours. BNP (last 3 results) No results for input(s): PROBNP in the last 8760 hours. HbA1C: No results for input(s): HGBA1C in the last 72 hours. CBG: No results for input(s): GLUCAP in the last 168 hours. Lipid Profile: No results for input(s): CHOL, HDL, LDLCALC, TRIG, CHOLHDL, LDLDIRECT in the last 72 hours. Thyroid Function Tests: No results for input(s): TSH, T4TOTAL, FREET4, T3FREE, THYROIDAB in the last 72 hours. Anemia Panel: No results for input(s): VITAMINB12, FOLATE, FERRITIN, TIBC, IRON, RETICCTPCT in the last 72 hours. Urine analysis:    Component Value Date/Time   COLORURINE YELLOW 09/22/2019 0728   APPEARANCEUR HAZY (A) 09/22/2019 0728   LABSPEC 1.011 09/22/2019 0728   PHURINE 6.0 09/22/2019 0728   GLUCOSEU NEGATIVE 09/22/2019 0728   HGBUR NEGATIVE 09/22/2019 0728   BILIRUBINUR NEGATIVE 09/22/2019 0728   KETONESUR NEGATIVE 09/22/2019 0728   PROTEINUR NEGATIVE 09/22/2019 0728   UROBILINOGEN 0.2 06/09/2009 0918   NITRITE POSITIVE (A) 09/22/2019 0728   LEUKOCYTESUR LARGE (A) 09/22/2019 0728   Sepsis Labs: @LABRCNTIP (procalcitonin:4,lacticidven:4) ) Recent Results (from the past 240 hour(s))  Surgical pcr screen     Status: None   Collection Time: 09/17/19  1:22 PM   Specimen: Nasal Mucosa; Nasal Swab  Result Value Ref Range Status   MRSA, PCR NEGATIVE NEGATIVE Final   Staphylococcus aureus NEGATIVE NEGATIVE Final    Comment:  (NOTE) The Xpert SA Assay (FDA approved  for NASAL specimens in patients 34 years of age and older), is one component of a comprehensive surveillance program. It is not intended to diagnose infection nor to guide or monitor treatment. Performed at [facility], [place] [address] [place], [place] [address]   SARS CORONAVIRUS 2 (TAT 6-24 HRS) Nasopharyngeal Nasopharyngeal Swab     Status: None   Collection Time: 09/19/19 12:52 PM   Specimen: Nasopharyngeal Swab  Result Value Ref Range Status   SARS Coronavirus 2 NEGATIVE NEGATIVE Final    Comment: (NOTE) SARS-CoV-2 target nucleic acids are NOT DETECTED. The SARS-CoV-2 RNA is generally detectable in upper and lower respiratory specimens during the acute phase of infection. Negative results do not preclude SARS-CoV-2 infection, do not rule out co-infections with other pathogens, and should not be used as the sole basis for treatment or other patient management decisions. Negative results must be combined with clinical observations, patient history, and epidemiological information. The expected result is Negative. Fact Sheet for Patients: SugarRoll.be Fact Sheet for Healthcare Providers: https://www.woods-mathews.com/ This test is not yet approved or cleared by the [place] FDA and  has been authorized for detection and/or diagnosis of SARS-CoV-2 by FDA under an Emergency Use Authorization (EUA). This EUA will remain  in effect (meaning this test can be used) for the duration of the COVID-19 declaration under Section 56 4(b)(1) of the Act, 21 U.S.C. section 360bbb-3(b)(1), unless the authorization is terminated or revoked sooner. Performed at [facility], [place] [address] [place], [place] [address]      Radiological Exams on Admission: DG Chest Portable 1 View  Result Date: 09/26/2019 CLINICAL DATA:  Chest pain. EXAM: PORTABLE CHEST 1 VIEW COMPARISON:  September 22, 2019. FINDINGS:  Stable cardiomediastinal silhouette. Stable position of stent graft involving transverse aortic arch and descending thoracic aorta. Status post coronary bypass graft. No pneumothorax is noted. Minimal left basilar subsegmental atelectasis is noted. Stable right basilar atelectasis or infiltrate is noted with probable small pleural effusion. Bony thorax is unremarkable. IMPRESSION: Stable right basilar atelectasis or infiltrate is noted with probable small pleural effusion. Electronically Signed   By: Marijo Conception M.D.   On: 09/26/2019 13:22   CT Angio Chest/Abd/Pel for Dissection W and/or Wo Contrast  Result Date: 09/26/2019 CLINICAL DATA:  Chest pain x2 days. EXAM: CT ANGIOGRAPHY CHEST, ABDOMEN AND PELVIS TECHNIQUE: Multidetector CT imaging through the chest, abdomen and pelvis was performed using the standard protocol during bolus administration of intravenous contrast. Multiplanar reconstructed images and MIPs were obtained and reviewed to evaluate the vascular anatomy. CONTRAST:  116mL OMNIPAQUE IOHEXOL 350 MG/ML SOLN COMPARISON:  August 21, 2019 FINDINGS: CTA CHEST FINDINGS Cardiovascular: There is extensive stenting of the proximal, mid and distal portions of the aortic arch, as well as the length of the descending thoracic aorta. There is no evidence of associated stent occlusion or stent leak. Satisfactory opacification of the pulmonary arteries to the segmental level. No evidence of pulmonary embolism. Normal heart size. No pericardial effusion. Marked severity coronary artery calcification is seen. Mediastinum/Nodes: No enlarged mediastinal, hilar, or axillary lymph nodes. Thyroid gland, trachea, and esophagus demonstrate no significant findings. Lungs/Pleura: Mild to moderate severity atelectasis and/or infiltrate is seen along the posterior and lateral aspects of the right lung base. There is a small right pleural effusion. No pneumothorax is identified. Musculoskeletal: Multiple sternal wires are  seen. No acute osseous abnormalities are seen. Review of the MIP images confirms the above findings. CTA ABDOMEN AND PELVIS FINDINGS VASCULAR  Aorta: There is stable, approximately 4.9 cm x 4.6 cm aneurysmal dilatation of the infrarenal abdominal aorta. Approximately 4.4 cm x 3.6 cm aneurysmal dilatation is seen at the level of the renal arteries. There is moderate to marked severity calcification and atherosclerosis without evidence of aortic dissection. Celiac: There is stable marked severity narrowing of the origin of the celiac artery. SMA: Stable marked severity narrowing of the origin of the superior mesenteric artery is seen. Renals: Single renal arteries are noted, bilaterally. Stable narrowing of the origins of both renal arteries is seen which appears to be hemodynamically significant (greater than 70% stenosis on the left and greater than 80% stenosis on the right). 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 Distal reconstitution of flow is noted. Inflow: There is marked severity calcification and tortuosity of the bilateral common iliac arteries with stable bilateral diffuse of formed dilatation (measuring approximately 2.1 cm in greatest diameter on the right and approximately 1.8 cm greatest diameter on the left). Veins: No obvious venous abnormality within the limitations of this arterial phase study. Review of the MIP images confirms the above findings. NON-VASCULAR Hepatobiliary: No focal liver abnormality is seen. No gallstones, gallbladder wall thickening, or biliary dilatation. Pancreas: Unremarkable. No pancreatic ductal dilatation or surrounding inflammatory changes. Spleen: Normal in size without focal abnormality. Adrenals/Urinary Tract: Adrenal glands are unremarkable. Kidneys are normal in size, without renal calculi or focal lesions. Mild to moderate severity left-sided hydronephrosis and hydroureter are noted. The urinary bladder is markedly distended and otherwise normal in appearance.  Stomach/Bowel: Stomach is within normal limits. The appendix is not identified. No evidence of bowel wall thickening, distention, or inflammatory changes. Lymphatic: No enlarged abdominal or pelvic lymph nodes are identified. Reproductive: A small amount of focal prostate gland calcification is seen. Other: No abdominal wall hernia or abnormality. No abdominopelvic ascites. Musculoskeletal: Stable postoperative changes are seen at the level of L4-L5. Review of the MIP images confirms the above findings. IMPRESSION: 1. No evidence of pulmonary embolus. 2. Mild to moderate severity atelectasis and/or infiltrate along the posterior and lateral aspects of the right lung base. 3. Small right pleural effusion. 4. Stable, approximately 4.9 cm x 4.6 cm infrarenal abdominal aortic aneurysm. 5. Stable marked severity narrowing of the origin of the celiac artery, superior mesenteric artery and bilateral renal arteries with occlusion of the IMA and distal reconstitution of flow. 6. Prior stenting of the thoracic aorta without evidence of stent occlusion or stent leak. 7. Marked severity coronary artery calcification. Aortic Atherosclerosis (ICD10-I70.0). Electronically Signed   By: Virgina Norfolk M.D.   On: 09/26/2019 18:49    EKG: Independently reviewed.  Normal sinus rhythm RBBB.  Assessment/Plan Principal Problem:   Chest pain Active Problems:   Essential hypertension   Aneurysm of thoracic aorta (HCC)    1. Chest pain with history of CABG concerning for ACS.  Will cycle cardiac markers keep patient n.p.o. in the morning except medication consult cardiology.  Patient is on aspirin Cardizem and statins. 2. Recent endovascular repair of descending thoracic aortic aneurysm had a CT scan done and was evaluated by Dr. Carlis Abbott vascular surgeon lower at this time feels that there is no endovascular leak.  Closely monitor. 3. Anemia follow CBC. 4. Previous history of proximal atrial fibrillation presently in sinus  rhythm. 5. CT scan was showing possibility of infiltrates versus atelectasis in the lung.  Patient does not have any cough or fever or leukocytosis.  I have not started patient on IV antibiotics.  Will check procalcitonin.   DVT prophylaxis: Lovenox. Code Status: Full code. Family Communication: Discussed with patient. Disposition Plan: Home. Consults called: Vascular surgery and cardiology has been notified. Admission status: Observation.   Rise Patience MD [facility] Pager [phone].  If 7PM-7AM, please contact night-coverage [email or web address] Password [facility]  09/26/2019, 9:08 PM

## 2019-09-26 NOTE — ED Provider Notes (Signed)
Patient sent to me by Dr. Alvino Chapel pending chest CT which came back without acute findings.  Due to patient's history of angina responsive to nitroglycerin have consulted hospitalist for admission.   Lacretia Leigh, MD 09/26/19 2057

## 2019-09-26 NOTE — ED Notes (Signed)
CT notified pt ready for transport. Pt asking about delay. CT stated pt is next in line. Pt requesting to self cath. Reports he self caths 3x/day.

## 2019-09-26 NOTE — Telephone Encounter (Signed)
Pt left message on triage line. Call returned; pt did not answer. Left VM.

## 2019-09-26 NOTE — Progress Notes (Signed)
86 year old male status post endovascular repair of descending thoracic aortic aneurysm on 09/22/2019.  Ultimately presented to the ED today with complaints of chest pain that resolved with nitroglycerin and has known history of coronary artery disease with previous CABG.  I reviewed his CTA chest and the stent graft looks well positioned with no endoleak.  His right groin is clean dry and intact with a palpable femoral pulse and he also has a palpable right dorsalis pedis pulse.  Would agree with plan for ACS rule out but otherwise do not see any overt complications from stent graft repair at this time.  Marty Heck, MD [facility] Office: [place]

## 2019-09-26 NOTE — ED Notes (Signed)
Pt transported to CT scan.

## 2019-09-26 NOTE — ED Provider Notes (Signed)
[facility] EMERGENCY DEPARTMENT Provider Note   CSN: [id number] Arrival date & time: 09/26/19  1241     History Chief Complaint  Patient presents with  . Chest Pain    Andrew [name] is a 86 y.o. male.  HPI Patient presents with chest pain.  Began last night.  For the last day however has been more constant.  Sharper.  Is in the anterior chest.  Took a nitroglycerin at home and an aspirin.  States the pain improved shortly after.  States does not feel like for previous coronary artery disease.  Has had CABG and cardiac stents and angioplasty.  Also was discharged 2 days ago after having endovascular repair of a thoracic aortic aneurysm.  Was done by vascular surgery, Dr. Carlis Abbott.  No swelling his legs.  Pain does mildly radiate to the back.    Past Medical History:  Diagnosis Date  . AAA (abdominal aortic aneurysm) ([place])   . Angioedema 06/01/2015  . Arthritis   . Back pain   . 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 Chronic kidney disease    ? bph, pt self catheterizes himself on schedule  . Complication of anesthesia    had to have Foley post op cabg and lumb lam  . Coronary artery disease   . Hypertension   . Thoracic aortic aneurysm ([place])    09/29/16 CT: 4.3 cm ascending, 4.5 cm descending. 1 yr f/u rec  . Urinary retention   . Wears glasses     Patient Active Problem List   Diagnosis Date Noted  . Aneurysm of thoracic aorta ([place]) 09/22/2019  . AAA (abdominal aortic aneurysm) without rupture ([place]) 04/29/2019  . Pain in left knee 12/17/2018  . Prepatellar bursitis of left knee 12/17/2018  . Lower urinary tract infectious disease   . Acute UTI 09/20/2018  . Coronary artery disease 09/20/2018  . Urinary retention 09/20/2018  . Thoracic aortic aneurysm without rupture ([place]) 07/12/2018  . Bilateral nonexudative age-related macular degeneration 12/24/2017  . Fuchs' corneal dystrophy 12/24/2017  . Pseudophakia  of both eyes 12/24/2017  . S/P YAG capsulotomy, bilateral 12/24/2017  . Epigastric hernia 09/11/2017  . ED (erectile dysfunction) of organic origin 02/21/2016  . Allergic reaction 06/01/2015  . Angioedema 06/01/2015  . S/P orthopedic surgery, follow-up exam 02/09/2015  . Tendinosis 12/15/2014  . Spermatocele 03/23/2014  . Spondylolisthesis of lumbar region 03/09/2014  . Essential hypertension 10/28/2013  . Lumbar radiculopathy 10/01/2013  . Spinal stenosis 08/05/2013  . 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/28/2013  . Neurogenic bladder disorder 07/28/2013  . 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 Benign prostate hyperplasia 02/17/2013  . Left varicocele 02/12/2012  . Pain in testicle 02/12/2012    Past Surgical History:  Procedure Laterality Date  . BACK SURGERY  2012   lumb lam  . CARDIAC CATHETERIZATION     2010-per patient  . COLONOSCOPY    . CORONARY ARTERY BYPASS GRAFT     2010  . EPIGASTRIC HERNIA REPAIR N/A 09/11/2017   Procedure: HERNIA REPAIR EPIGASTRIC ADULT;  Surgeon: Rolm Bookbinder, MD;  Location: [facility];  Service: General;  Laterality: N/A;  . HERNIA REPAIR     rih 1981  . HERNIA REPAIR  09/11/2017   with mesh  . INGUINAL HERNIA REPAIR  07/30/2012   Procedure: HERNIA REPAIR INGUINAL ADULT;  Surgeon: Rolm Bookbinder, MD;  Location: [facility];  Service: General;  Laterality: Left;  Left Hernia Site  .  INSERTION OF MESH  07/30/2012   Procedure: INSERTION OF MESH;  Surgeon: Rolm Bookbinder, MD;  Location: [facility];  Service: General;  Laterality: Left;  Left Inguinal Hernia  . INSERTION OF MESH N/A 09/11/2017   Procedure: INSERTION OF MESH;  Surgeon: Rolm Bookbinder, MD;  Location: [facility];  Service: General;  Laterality: N/A;  . LAPAROSCOPY N/A 09/11/2017   Procedure: DIAGNOSTIC LAPAOSCOPY;  Surgeon: Rolm Bookbinder, MD;  Location: [facility];  Service: General;  Laterality: N/A;  ERAS pathway  . SPINE SURGERY     lumbar laminectomy  . THORACIC AORTIC  ENDOVASCULAR STENT GRAFT N/A 09/22/2019   Procedure: THORACIC AORTIC ENDOVASCULAR STENT GRAFT;  Surgeon: Marty Heck, MD;  Location: [facility] OR;  Service: Vascular;  Laterality: N/A;  . TONSILLECTOMY         Family History  Problem Relation Age of Onset  . Allergic rhinitis Son   . Coronary artery disease Mother   . Angioedema Neg Hx   . Asthma Neg Hx   . Atopy Neg Hx   . Eczema Neg Hx   . Immunodeficiency Neg Hx   . Urticaria Neg Hx     Social History   Tobacco Use  . Smoking status: Former Smoker    Packs/day: 1.00    Years: 8.00    Pack years: 8.00    Quit date: 07/24/1984    Years since quitting: 35.1  . Smokeless tobacco: Never Used  Substance Use Topics  . Alcohol use: No  . Drug use: No    Home Medications Prior to Admission medications   Medication Sig Start Date End Date Taking? Authorizing Provider  aspirin 81 MG tablet Take 81 mg by mouth daily.    [provider]  atorvastatin (LIPITOR) 20 MG tablet TAKE 1 TABLET ONCE DAILY AT 6 PM. Patient taking differently: Take 20 mg by mouth daily at 6 PM.  02/18/19   Hilty, Nadean Corwin, MD  Cholecalciferol (VITAMIN D3) 1000 units CAPS Take 1,000 Units by mouth daily.    [provider]  Co-Enzyme Q-10 100 MG CAPS Take 100 mg by mouth daily.     [provider]  DILT-XR 180 MG 24 hr capsule TAKE 1 CAPSULE DAILY. Patient taking differently: Take 180 mg by mouth daily.  07/21/19   Hilty, Nadean Corwin, MD  ferrous sulfate 325 (65 FE) MG tablet Take 325 mg by mouth daily with breakfast.    [provider]  gabapentin (NEURONTIN) 300 MG capsule Take 300 mg by mouth at bedtime.  04/18/12   [provider]  Multiple Vitamins-Minerals (PRESERVISION AREDS 2 PO) Take 1 capsule by mouth 2 (two) times daily.    [provider]  nitroGLYCERIN (NITRODUR - DOSED IN MG/24 HR) 0.2 mg/hr patch Use 1/2 patch daily to the affected area Patient not taking: Reported on 09/10/2019 10/11/18    Dene Gentry, MD  nitroGLYCERIN (NITROSTAT) 0.6 MG SL tablet Place 0.6 mg under the tongue every 5 (five) minutes as needed for chest pain.    [provider]  oxyCODONE-acetaminophen (PERCOCET) 5-325 MG tablet Take 1 tablet by mouth every 6 (six) hours as needed for severe pain. 09/24/19 09/23/20  Baglia, Corrina, PA-C  oxymetazoline (AFRIN) 0.05 % nasal spray Place 1 spray into both nostrils at bedtime as needed for congestion.    [provider]  sodium chloride (MURO 128) 5 % ophthalmic ointment Place 1 application into both eyes at bedtime.    [provider]  triamcinolone (NASACORT ALLERGY 24HR) 55 MCG/ACT AERO nasal inhaler Place 2 sprays into the nose daily as needed (for allergies).    [provider]  vitamin B-12 (CYANOCOBALAMIN) 1000 MCG tablet Take 1,000 mcg by mouth daily.     [provider]  vitamin C (ASCORBIC ACID) 250 MG tablet Take 250 mg by mouth daily.     [provider]    Allergies    Quinolones  Review of Systems   Review of Systems  Constitutional: Negative for appetite change.  HENT: Negative for congestion.   Respiratory: Negative for cough and shortness of breath.   Cardiovascular: Positive for chest pain. Negative for leg swelling.  Gastrointestinal: Negative for abdominal pain.  Genitourinary: Negative for flank pain.  Musculoskeletal: Positive for back pain.  Skin: Negative for rash.  Neurological: Negative for weakness.  Psychiatric/Behavioral: Negative for confusion.    Physical Exam Updated Vital Signs BP 116/88   Pulse 71   Temp 97.9 F (36.6 C) (Oral)   Resp 11   Ht 5\' 8"  (1.727 m)   Wt 65.6 kg   SpO2 96%   BMI 21.99 kg/m   Physical Exam Vitals and nursing note reviewed.  Eyes:     Pupils: Pupils are equal, round, and reactive to light.  Cardiovascular:     Rate and Rhythm: Normal rate and regular rhythm.  Pulmonary:     Breath sounds: No wheezing or rhonchi.  Chest:     Chest  wall: No tenderness.  Abdominal:     Palpations: Abdomen is soft.     Tenderness: There is no abdominal tenderness.  Musculoskeletal:     Right lower leg: No edema.     Left lower leg: No edema.  Skin:    General: Skin is warm.     Capillary Refill: Capillary refill takes less than 2 seconds.  Neurological:     Mental Status: He is alert and oriented to person, place, and time.     ED Results / Procedures / Treatments   Labs (all labs ordered are listed, but only abnormal results are displayed) Labs Reviewed  BASIC METABOLIC PANEL - Abnormal; Notable for the following components:      Result Value   Glucose, Bld 108 (*)    GFR calc non Af Amer 57 (*)    All other components within normal limits  CBC - Abnormal; Notable for the following components:   RBC 4.07 (*)    Hemoglobin 12.0 (*)    HCT 38.3 (*)    Platelets 142 (*)    All other components within normal limits  TROPONIN I (HIGH SENSITIVITY)  TROPONIN I (HIGH SENSITIVITY)    EKG EKG Interpretation  Date/Time:  Friday September 26 2019 12:43:14 EST Ventricular Rate:  73 PR Interval:    QRS Duration: 135 QT Interval:  416 QTC Calculation: 459 R Axis:   67 Text Interpretation: Sinus rhythm Ventricular premature complex Prolonged PR interval Right bundle branch block Baseline wander in lead(s) V3 Confirmed by Davonna Belling [id number]) on 09/26/2019 12:50:20 PM   Radiology DG Chest Portable 1 View  Result Date: 09/26/2019 CLINICAL DATA:  Chest pain. EXAM: PORTABLE CHEST 1 VIEW COMPARISON:  September 22, 2019. FINDINGS: Stable cardiomediastinal silhouette. Stable position of stent graft involving transverse aortic arch and descending thoracic aorta. Status post coronary bypass graft. No pneumothorax is noted. Minimal left basilar subsegmental atelectasis is noted. Stable right basilar atelectasis or infiltrate is noted with probable small pleural effusion. Bony thorax is  unremarkable. IMPRESSION: Stable right basilar atelectasis  or infiltrate is noted with probable small pleural effusion. Electronically Signed   By: Marijo Conception M.D.   On: 09/26/2019 13:22    Procedures Procedures (including critical care time)  Medications Ordered in ED Medications - No data to display  ED Course  I have reviewed the triage vital signs and the nursing notes.  Pertinent labs & imaging results that were available during my care of the patient were reviewed by me and considered in my medical decision making (see chart for details).    MDM Rules/Calculators/A&P                      Patient with chest pain.  Dull in the anterior chest but states feels different than his previous anginal pain.  Did however have a recent endovascular thoracic aorta repair.  Imaging pending.  EKG reassuring.  Initial troponin negative.  Care will be turned over to oncoming provider. Final Clinical Impression(s) / ED Diagnoses Final diagnoses:  Nonspecific chest pain    Rx / DC Orders ED Discharge Orders    None       Davonna Belling, MD 09/26/19 1500

## 2019-09-27 ENCOUNTER — Observation Stay (HOSPITAL_BASED_OUTPATIENT_CLINIC_OR_DEPARTMENT_OTHER): Payer: Medicare Other

## 2019-09-27 ENCOUNTER — Encounter (HOSPITAL_COMMUNITY): Payer: Self-pay | Admitting: Internal Medicine

## 2019-09-27 DIAGNOSIS — R079 Chest pain, unspecified: Secondary | ICD-10-CM | POA: Diagnosis not present

## 2019-09-27 DIAGNOSIS — I361 Nonrheumatic tricuspid (valve) insufficiency: Secondary | ICD-10-CM

## 2019-09-27 LAB — NM MYOCAR MULTI W/SPECT W/WALL MOTION / EF
LV dias vol: 89 mL (ref 62–150)
LV sys vol: 27 mL
Peak HR: 88 {beats}/min
Rest HR: 82 {beats}/min
TID: 2.09

## 2019-09-27 LAB — SARS CORONAVIRUS 2 (TAT 6-24 HRS): SARS Coronavirus 2: NEGATIVE

## 2019-09-27 LAB — BASIC METABOLIC PANEL
Anion gap: 10 (ref 5–15)
BUN: 17 mg/dL (ref 8–23)
CO2: 28 mmol/L (ref 22–32)
Calcium: 8.8 mg/dL — ABNORMAL LOW (ref 8.9–10.3)
Chloride: 102 mmol/L (ref 98–111)
Creatinine, Ser: 1.01 mg/dL (ref 0.61–1.24)
GFR calc Af Amer: >60 mL/min (ref >60)
GFR calc non Af Amer: >60 mL/min (ref >60)
Glucose, Bld: 99 mg/dL (ref 70–99)
Potassium: 3.8 mmol/L (ref 3.5–5.1)
Sodium: 140 mmol/L (ref 135–145)

## 2019-09-27 LAB — CBC WITH DIFFERENTIAL/PLATELET
Abs Immature Granulocytes: 0.07 10*3/uL (ref 0.00–0.07)
Basophils Absolute: 0.1 10*3/uL (ref 0.0–0.1)
Basophils Relative: 1 %
Eosinophils Absolute: 0.5 10*3/uL (ref 0.0–0.5)
Eosinophils Relative: 5 %
HCT: 36.3 % — ABNORMAL LOW (ref 39.0–52.0)
Hemoglobin: 11.7 g/dL — ABNORMAL LOW (ref 13.0–17.0)
Immature Granulocytes: 1 %
Lymphocytes Relative: 10 %
Lymphs Abs: 0.9 10*3/uL (ref 0.7–4.0)
MCH: 29.7 pg (ref 26.0–34.0)
MCHC: 32.2 g/dL (ref 30.0–36.0)
MCV: 92.1 fL (ref 80.0–100.0)
Monocytes Absolute: 1 10*3/uL (ref 0.1–1.0)
Monocytes Relative: 12 %
Neutro Abs: 6.2 10*3/uL (ref 1.7–7.7)
Neutrophils Relative %: 71 %
Platelets: 150 10*3/uL (ref 150–400)
RBC: 3.94 MIL/uL — ABNORMAL LOW (ref 4.22–5.81)
RDW: 14.2 % (ref 11.5–15.5)
WBC: 8.7 10*3/uL (ref 4.0–10.5)
nRBC: 0 % (ref 0.0–0.2)

## 2019-09-27 LAB — ECHOCARDIOGRAM COMPLETE
Height: 69 in
Weight: 2209.6 oz

## 2019-09-27 LAB — TROPONIN I (HIGH SENSITIVITY)
Troponin I (High Sensitivity): 35 ng/L — ABNORMAL HIGH (ref <18)
Troponin I (High Sensitivity): 24 ng/L — ABNORMAL HIGH (ref <18)
Troponin I (High Sensitivity): 18 ng/L — ABNORMAL HIGH (ref <18)

## 2019-09-27 LAB — PROCALCITONIN: Procalcitonin: <0.1 ng/mL

## 2019-09-27 MED ORDER — REGADENOSON 0.4 MG/5ML IV SOLN
0.4000 mg | Freq: Once | INTRAVENOUS | Status: AC
  Filled 2019-09-27: qty 5

## 2019-09-27 MED ORDER — ISOSORBIDE MONONITRATE ER 30 MG PO TB24: 15.0000 mg | ORAL_TABLET | Freq: Every day | ORAL | 0 refills | Status: DC

## 2019-09-27 MED ORDER — TECHNETIUM TC 99M TETROFOSMIN IV KIT
32.3000 | PACK | Freq: Once | INTRAVENOUS | Status: AC | PRN
  Administered 2019-09-27: 32.3 via INTRAVENOUS

## 2019-09-27 MED ORDER — TECHNETIUM TC 99M TETROFOSMIN IV KIT
10.6700 | PACK | Freq: Once | INTRAVENOUS | Status: AC | PRN
  Administered 2019-09-27: 10.67 via INTRAVENOUS

## 2019-09-27 MED ORDER — REGADENOSON 0.4 MG/5ML IV SOLN
INTRAVENOUS | Status: AC
  Administered 2019-09-27: 0.4 mg via INTRAVENOUS
  Filled 2019-09-27: qty 5

## 2019-09-27 NOTE — Discharge Instructions (Signed)

## 2019-09-27 NOTE — Care Management Obs Status (Signed)
[facility] NOTIFICATION   Patient Details  Name: JUNG BUESO MRN: [id number] Date of Birth: 04-10-1932   Medicare Observation Status Notification Given:  Yes    Royston Bake, RN 09/27/2019, 3:37 PM

## 2019-09-27 NOTE — Progress Notes (Signed)
*  PRELIMINARY RESULTS* Echocardiogram 2D Echocardiogram has been performed.  Leavy Cella 09/27/2019, 3:51 PM

## 2019-09-27 NOTE — Progress Notes (Signed)
[facility]   Subjective  -no chest pain overnight   Objective 108/85 89 97.6 F (36.4 C) (Oral) 15 92%  Intake/Output Summary (Last 24 hours) at 09/27/2019 0847 Last data filed at 09/27/2019 0000 Gross per 24 hour  Intake 360 ml  Output 1500 ml  Net -1140 ml    Right groin clean dry and intact with a palpable femoral and dorsalis pedis pulse in right foot  Laboratory Lab Results: Recent Labs    09/26/19 2135 09/27/19 0539  WBC 9.7 8.7  HGB 12.3* 11.7*  HCT 38.5* 36.3*  PLT 148* 150   BMET Recent Labs    09/26/19 1300 09/26/19 1300 09/26/19 2135 09/27/19 0539  NA 138  --   --  140  K 4.0  --   --  3.8  CL 100  --   --  102  CO2 27  --   --  28  GLUCOSE 108*  --   --  99  BUN 18  --   --  17  CREATININE 1.16   < > 0.96 1.01  CALCIUM 9.2  --   --  8.8*   < > = values in this interval not displayed.    COAG Lab Results  Component Value Date   INR 1.2 09/22/2019   INR 1.0 09/17/2019   INR 1.30 06/11/2009   No results found for: PTT  Assessment/Planning:  86 year old male status post endovascular repair of descending thoracic aneurysm.  Discussed with him this morning that after review of the CTA stent graft looks well positioned and no endoleak.  Mildly elevated troponin and is ACS rule out.  No further chest pain overnight.  Has follow-up scheduled with [place] already.  Marty Heck 09/27/2019 8:47 AM --

## 2019-09-27 NOTE — Discharge Summary (Signed)
Physician Discharge Summary  Andrew [name] [id number] DOB: [birth year] DOA: 09/26/2019  PCP: Carlyle Basques, MD  Admit date: 09/26/2019 Discharge date: 09/27/2019  Admitted From: Home Disposition: Home  Recommendations for Outpatient Follow-up:  1. Follow up with PCP in 1-2 weeks 2. Follow-up with cardiology in 2 to 3 weeks 3. Please obtain BMP/CBC in one week 4. Please follow up on the following pending results:  Home Health: None Equipment/Devices: None  Discharge Condition: Stable CODE STATUS: Full code Diet recommendation: Cardiac  Subjective: Seen and examined earlier this morning.  His chest pain had resolved with no other complaints.  HPI: Andrew [name] is a 86 y.o. male with history of CAD status post CABG who has underwent recent endovascular repair of descending thoracic aortic aneurysm on March 1 presents to the ER with complaints of chest pain.  Patient states since discharge last 2 days patient has been having chest pain which initially was intermittent became more persistent today and presents to the ER.  Denies any associated shortness of breath productive cough fever or chills.  Denies any abdominal pain nausea or vomiting.  ED Course: EMS was called and patient was brought to the ER.  Initially aspirin was given and following giving sublingual nitroglycerin patient's chest pain resolved.  EKG shows normal sinus rhythm.  Since patient had recent endovascular repair of descending thoracic aortic aneurysm CT angiogram of the chest abdomen and pelvis was done which does not show any leak and Dr. Carlis Abbott vascular surgeon evaluated and at this time concern is for possible ACS rule out.  Covid test was negative.  Labs show hemoglobin around 12 WBC 9.4 high sensitive troponin initially was 8 and 7.  Brief/Interim Summary: Patient was admitted with chest pain.  Was ruled out of MI with negative cardiac enzymes.  Seen by cardiology.  Stress test done which showed fixed defect, low  risk study.  Cardiology cleared him to discharge with recommendations to continue all home medications and add Imdur 15 mg p.o. daily.  He was also seen by vascular surgeon due to his recent history of endovascular repair of descending thoracic aortic aneurysm on 09/22/2019 and his pain was not thought to be secondary to that as well so he was cleared by vascular surgery as well.  He is being discharged in stable condition.  Discharge Diagnoses:  Principal Problem:   Chest pain Active Problems:   Essential hypertension   Aneurysm of thoracic aorta [facility])    Discharge Instructions  Discharge Instructions    Discharge patient   Complete by: As directed    Discharge disposition: 01-Home or Self Care   Discharge patient date: 09/27/2019     Allergies as of 09/27/2019      Reactions   Quinolones Other (See Comments)   Patient has an abdominal aortic aneurysm       Medication List    TAKE these medications   aspirin 81 MG tablet Take 81 mg by mouth daily after supper.   atorvastatin 20 MG tablet Commonly known as: LIPITOR TAKE 1 TABLET ONCE DAILY AT 6 PM. What changed: See the new instructions.   Co-Enzyme Q-10 100 MG Caps Take 100 mg by mouth daily.   CRANBERRY EXTRACT PO Take 1 tablet by mouth daily.   Dilt-XR 180 MG 24 hr capsule Generic drug: diltiazem TAKE 1 CAPSULE DAILY. What changed: how much to take   ferrous sulfate 325 (65 FE) MG tablet Take 325 mg by mouth daily with breakfast.   gabapentin  300 MG capsule Commonly known as: NEURONTIN Take 300 mg by mouth at bedtime.   ibuprofen 200 MG tablet Commonly known as: ADVIL Take 200-400 mg by mouth every 6 (six) hours as needed (for pain).   isosorbide mononitrate 30 MG 24 hr tablet Commonly known as: IMDUR Take 0.5 tablets (15 mg total) by mouth daily.   loratadine 10 MG tablet Commonly known as: CLARITIN Take 10 mg by mouth daily as needed for allergies or rhinitis.   Nasacort Allergy 24HR 55 MCG/ACT Aero  nasal inhaler Generic drug: triamcinolone Place 2 sprays into the nose daily as needed (for allergies).   nitroGLYCERIN 0.6 MG SL tablet Commonly known as: NITROSTAT Place 0.6 mg under the tongue every 5 (five) minutes as needed for chest pain.   nitroGLYCERIN 0.2 mg/hr patch Commonly known as: NITRODUR - Dosed in mg/24 hr Use 1/2 patch daily to the affected area   oxyCODONE-acetaminophen 5-325 MG tablet Commonly known as: Percocet Take 1 tablet by mouth every 6 (six) hours as needed for severe pain.   oxymetazoline 0.05 % nasal spray Commonly known as: AFRIN Place 1 spray into both nostrils at bedtime as needed for congestion.   PRESERVISION AREDS 2 PO Take 1 capsule by mouth 2 (two) times daily.   sodium chloride 5 % ophthalmic ointment Commonly known as: MURO 0000000 Place 1 application into both eyes at bedtime.   TURMERIC PO Take 1 capsule by mouth daily after breakfast.   vitamin B-12 1000 MCG tablet Commonly known as: CYANOCOBALAMIN Take 1,000 mcg by mouth daily.   vitamin C 250 MG tablet Commonly known as: ASCORBIC ACID Take 250 mg by mouth daily.   Vitamin D3 25 MCG (1000 UT) Caps Take 1,000 Units by mouth daily.      Follow-up Information    Carlyle Basques, MD Follow up in 1 week(s).   Specialty: Infectious Diseases Contact information: [place] [address] [phone]        Pixie Casino, MD .   Specialty: Cardiology Contact information: [address] [place] [address] [phone]          Allergies  Allergen Reactions  . Quinolones Other (See Comments)    Patient has an abdominal aortic aneurysm     Consultations: Cardiology and vascular surgery   Procedures/Studies: NM Myocar Multi W/Spect W/Wall Motion / EF  Result Date: 09/27/2019  There was no ST segment deviation noted during stress.  Defect 1: There is a large defect of moderate severity present in the basal  inferoseptal, basal inferior, basal inferolateral, mid inferoseptal, mid inferior and mid inferolateral location.  The left ventricular ejection fraction is normal (55-65%).  No significant changes in ECG with lexiscan. TID is 2.09, so balanced ischemia cannot be excluded. The quality of the study is difficult, as there are significant extracardiac counts that may affect interpretation. There is a large defect of moderate severity in the inferiorseptal/inferior/inferolateral walls that improves with stress. This is not consistent with reversible ischemia.   DG Chest Portable 1 View  Result Date: 09/26/2019 CLINICAL DATA:  Chest pain. EXAM: PORTABLE CHEST 1 VIEW COMPARISON:  September 22, 2019. FINDINGS: Stable cardiomediastinal silhouette. Stable position of stent graft involving transverse aortic arch and descending thoracic aorta. Status post coronary bypass graft. No pneumothorax is noted. Minimal left basilar subsegmental atelectasis is noted. Stable right basilar atelectasis or infiltrate is noted with probable small pleural effusion. Bony thorax is unremarkable. IMPRESSION: Stable right basilar atelectasis  or infiltrate is noted with probable small pleural effusion. Electronically Signed   By: Marijo Conception M.D.   On: 09/26/2019 13:22   DG Chest Port 1 View  Result Date: 09/22/2019 CLINICAL DATA:  Postop aortic surgery EXAM: PORTABLE CHEST 1 VIEW COMPARISON:  Portable exam 1241 hours compared to 09/20/2018 FINDINGS: Interval placement of endoluminal stent within the aorta from aortic arch through descending thoracic aorta. Enlargement of cardiac silhouette post CABG. Mediastinal contour stable. Bibasilar atelectasis. No infiltrate, pleural effusion or pneumothorax. Catheter tubing projects over the lateral RIGHT chest wall. IMPRESSION: Bibasilar atelectasis. Electronically Signed   By: Lavonia Dana M.D.   On: 09/22/2019 13:23   ECHOCARDIOGRAM COMPLETE  Result Date: 09/27/2019    ECHOCARDIOGRAM REPORT    Patient Name:   Andrew [name] Date of Exam: 09/27/2019 Medical Rec #:  [id number]       Height:       69.0 in Accession #:    [id number]      Weight:       138.1 lb Date of Birth:  11/20/1931       BSA:          1.765 m Patient Age:    35 years        BP:           105/72 mmHg Patient Gender: M               HR:           80 bpm. Exam Location:  Inpatient Procedure: 2D Echo Indications:    Chest Pain 786.50 / R07.9  History:        Patient has prior history of Echocardiogram examinations, most                 recent 01/27/2019. CAD, Signs/Symptoms:Chest Pain; Risk                 Factors:Hypertension and Former Smoker. Aneurysm of thoracic                 aorta.  Sonographer:    Leavy Cella Referring Phys: [id number] [place]  1. Left ventricular ejection fraction, by estimation, is 60 to 65%. The left ventricle has normal function. The left ventricle has no regional wall motion abnormalities. Left ventricular diastolic parameters were normal.  2. Right ventricular systolic function is normal. The right ventricular size is normal. There is mildly elevated pulmonary artery systolic pressure.  3. The mitral valve is normal in structure and function. Trivial mitral valve regurgitation. No evidence of mitral stenosis.  4. The aortic valve is normal in structure and function. Aortic valve regurgitation is not visualized. Mild aortic valve sclerosis is present, with no evidence of aortic valve stenosis.  5. Aortic arch stent is seen.  6. The inferior vena cava is normal in size with greater than 50% respiratory variability, suggesting right atrial pressure of 3 mmHg. Comparison(s): No significant change from prior study. Prior images reviewed side by side. FINDINGS  Left Ventricle: Left ventricular ejection fraction, by estimation, is 60 to 65%. The left ventricle has normal function. The left ventricle has no regional wall motion abnormalities. The left ventricular internal cavity size was normal in  size. There is  no left ventricular hypertrophy. Left ventricular diastolic parameters were normal. Normal left ventricular filling pressure. Right Ventricle: The right ventricular size is normal. No increase in right ventricular wall thickness. Right ventricular systolic function is normal. There is mildly  elevated pulmonary artery systolic pressure. The tricuspid regurgitant velocity is 2.46  m/s, and with an assumed right atrial pressure of 10 mmHg, the estimated right ventricular systolic pressure is [id number] mmHg. Left Atrium: Left atrial size was normal in size. Right Atrium: Right atrial size was normal in size. Pericardium: There is no evidence of pericardial effusion. Mitral Valve: The mitral valve is normal in structure and function. Normal mobility of the mitral valve leaflets. Mild mitral annular calcification. Trivial mitral valve regurgitation. No evidence of mitral valve stenosis. Tricuspid Valve: The tricuspid valve is normal in structure. Tricuspid valve regurgitation is mild . No evidence of tricuspid stenosis. Aortic Valve: The aortic valve is normal in structure and function. Aortic valve regurgitation is not visualized. Mild aortic valve sclerosis is present, with no evidence of aortic valve stenosis. Aortic valve mean gradient measures 3.5 mmHg. Aortic valve peak gradient measures 6.4 mmHg. Pulmonic Valve: The pulmonic valve was normal in structure. Pulmonic valve regurgitation is not visualized. No evidence of pulmonic stenosis. Aorta: Aortic arch stent is seen. The aortic root is normal in size and structure. Venous: The inferior vena cava is normal in size with greater than 50% respiratory variability, suggesting right atrial pressure of 3 mmHg. IAS/Shunts: No atrial level shunt detected by color flow Doppler.  LEFT VENTRICLE PLAX 2D LVIDd:         3.00 cm  Diastology LVIDs:         2.10 cm  LV e' lateral:   9.79 cm/s LV PW:         1.20 cm  LV E/e' lateral: 9.6 LV IVS:        1.30 cm  LV e'  medial:    10.40 cm/s LVOT diam:     1.80 cm  LV E/e' medial:  9.0 LVOT Area:     2.54 cm  RIGHT VENTRICLE RV S prime:     10.60 cm/s TAPSE (M-mode): 1.5 cm LEFT ATRIUM             Index       RIGHT ATRIUM           Index LA diam:        3.50 cm 1.98 cm/m  RA Area:     16.90 cm LA Vol (A2C):   58.6 ml 33.20 ml/m RA Volume:   35.80 ml  20.28 ml/m LA Vol (A4C):   56.3 ml 31.90 ml/m LA Biplane Vol: 58.6 ml 33.20 ml/m  AORTIC VALVE AV Vmax:      126.50 cm/s AV Vmean:     91.900 cm/s AV VTI:       0.260 m AV Peak Grad: 6.4 mmHg AV Mean Grad: 3.5 mmHg  AORTA Ao Root diam: 3.50 cm MITRAL VALVE                TRICUSPID VALVE MV Area (PHT): 4.49 cm     TR Peak grad:   24.2 mmHg MV Decel Time: 169 msec     TR Vmax:        246.00 cm/s MV E velocity: 94.10 cm/s MV A velocity: 126.00 cm/s  SHUNTS MV E/A ratio:  0.75         Systemic Diam: 1.80 cm Dani Gobble Croitoru MD Electronically signed by Sanda Klein MD Signature Date/Time: 09/27/2019/4:06:52 PM    Final    CT Angio Chest/Abd/Pel for Dissection W and/or Wo Contrast  Result Date: 09/26/2019 CLINICAL DATA:  Chest pain x2 days. EXAM: CT ANGIOGRAPHY CHEST, ABDOMEN  AND PELVIS TECHNIQUE: Multidetector CT imaging through the chest, abdomen and pelvis was performed using the standard protocol during bolus administration of intravenous contrast. Multiplanar reconstructed images and MIPs were obtained and reviewed to evaluate the vascular anatomy. CONTRAST:  137mL OMNIPAQUE IOHEXOL 350 MG/ML SOLN COMPARISON:  August 21, 2019 FINDINGS: CTA CHEST FINDINGS Cardiovascular: There is extensive stenting of the proximal, mid and distal portions of the aortic arch, as well as the length of the descending thoracic aorta. There is no evidence of associated stent occlusion or stent leak. Satisfactory opacification of the pulmonary arteries to the segmental level. No evidence of pulmonary embolism. Normal heart size. No pericardial effusion. Marked severity coronary artery calcification  is seen. Mediastinum/Nodes: No enlarged mediastinal, hilar, or axillary lymph nodes. Thyroid gland, trachea, and esophagus demonstrate no significant findings. Lungs/Pleura: Mild to moderate severity atelectasis and/or infiltrate is seen along the posterior and lateral aspects of the right lung base. There is a small right pleural effusion. No pneumothorax is identified. Musculoskeletal: Multiple sternal wires are seen. No acute osseous abnormalities are seen. Review of the MIP images confirms the above findings. CTA ABDOMEN AND PELVIS FINDINGS VASCULAR Aorta: There is stable, approximately 4.9 cm x 4.6 cm aneurysmal dilatation of the infrarenal abdominal aorta. Approximately 4.4 cm x 3.6 cm aneurysmal dilatation is seen at the level of the renal arteries. There is moderate to marked severity calcification and atherosclerosis without evidence of aortic dissection. Celiac: There is stable marked severity narrowing of the origin of the celiac artery. SMA: Stable marked severity narrowing of the origin of the superior mesenteric artery is seen. Renals: Single renal arteries are noted, bilaterally. Stable narrowing of the origins of both renal arteries is seen which appears to be hemodynamically significant (greater than 70% stenosis on the left and greater than 80% stenosis on the right). 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 Distal reconstitution of flow is noted. Inflow: There is marked severity calcification and tortuosity of the bilateral common iliac arteries with stable bilateral diffuse of formed dilatation (measuring approximately 2.1 cm in greatest diameter on the right and approximately 1.8 cm greatest diameter on the left). Veins: No obvious venous abnormality within the limitations of this arterial phase study. Review of the MIP images confirms the above findings. NON-VASCULAR Hepatobiliary: No focal liver abnormality is seen. No gallstones, gallbladder wall thickening, or biliary dilatation. Pancreas:  Unremarkable. No pancreatic ductal dilatation or surrounding inflammatory changes. Spleen: Normal in size without focal abnormality. Adrenals/Urinary Tract: Adrenal glands are unremarkable. Kidneys are normal in size, without renal calculi or focal lesions. Mild to moderate severity left-sided hydronephrosis and hydroureter are noted. The urinary bladder is markedly distended and otherwise normal in appearance. Stomach/Bowel: Stomach is within normal limits. The appendix is not identified. No evidence of bowel wall thickening, distention, or inflammatory changes. Lymphatic: No enlarged abdominal or pelvic lymph nodes are identified. Reproductive: A small amount of focal prostate gland calcification is seen. Other: No abdominal wall hernia or abnormality. No abdominopelvic ascites. Musculoskeletal: Stable postoperative changes are seen at the level of L4-L5. Review of the MIP images confirms the above findings. IMPRESSION: 1. No evidence of pulmonary embolus. 2. Mild to moderate severity atelectasis and/or infiltrate along the posterior and lateral aspects of the right lung base. 3. Small right pleural effusion. 4. Stable, approximately 4.9 cm x 4.6 cm infrarenal abdominal aortic aneurysm. 5. Stable marked severity narrowing of the origin of the celiac artery, superior mesenteric artery and bilateral renal arteries with occlusion of the  IMA and distal reconstitution of flow. 6. Prior stenting of the thoracic aorta without evidence of stent occlusion or stent leak. 7. Marked severity coronary artery calcification. Aortic Atherosclerosis (ICD10-I70.0). Electronically Signed   By: Virgina Norfolk M.D.   On: 09/26/2019 18:49   HYBRID OR IMAGING ([facility] ONLY)  Result Date: 09/22/2019 There is no interpretation for this exam.  This order is for images obtained during a surgical procedure.  Please See "Surgeries" Tab for more information regarding the procedure.     Discharge Exam: Vitals:   09/27/19 1114 09/27/19  1222  BP: (!) 94/57 105/72  Pulse: 89   Resp:  16  Temp:    SpO2:     Vitals:   09/27/19 1110 09/27/19 1112 09/27/19 1114 09/27/19 1222  BP: (!) 125/100 103/90 (!) 94/57 105/72  Pulse: 86 89 89   Resp:    16  Temp:      TempSrc:      SpO2:      Weight:      Height:        General: Pt is alert, awake, not in acute distress Cardiovascular: RRR, S1/S2 +, no rubs, no gallops Respiratory: CTA bilaterally, no wheezing, no rhonchi Abdominal: Soft, NT, ND, bowel sounds + Extremities: no edema, no cyanosis    The results of significant diagnostics from this hospitalization (including imaging, microbiology, ancillary and laboratory) are listed below for reference.     Microbiology: Recent Results (from the past 240 hour(s))  SARS CORONAVIRUS 2 (TAT 6-24 HRS) Nasopharyngeal Nasopharyngeal Swab     Status: None   Collection Time: 09/19/19 12:52 PM   Specimen: Nasopharyngeal Swab  Result Value Ref Range Status   SARS Coronavirus 2 NEGATIVE NEGATIVE Final    Comment: (NOTE) SARS-CoV-2 target nucleic acids are NOT DETECTED. The SARS-CoV-2 RNA is generally detectable in upper and lower respiratory specimens during the acute phase of infection. Negative results do not preclude SARS-CoV-2 infection, do not rule out co-infections with other pathogens, and should not be used as the sole basis for treatment or other patient management decisions. Negative results must be combined with clinical observations, patient history, and epidemiological information. The expected result is Negative. Fact Sheet for Patients: SugarRoll.be Fact Sheet for Healthcare Providers: https://www.woods-mathews.com/ This test is not yet approved or cleared by the [place] FDA and  has been authorized for detection and/or diagnosis of SARS-CoV-2 by FDA under an Emergency Use Authorization (EUA). This EUA will remain  in effect (meaning this test can be used) for  the duration of the COVID-19 declaration under Section 56 4(b)(1) of the Act, 21 U.S.C. section 360bbb-3(b)(1), unless the authorization is terminated or revoked sooner. Performed at [facility], [place] [address] [place], [place] [address]   SARS CORONAVIRUS 2 (TAT 6-24 HRS) Nasopharyngeal Nasopharyngeal Swab     Status: None   Collection Time: 09/26/19  9:35 PM   Specimen: Nasopharyngeal Swab  Result Value Ref Range Status   SARS Coronavirus 2 NEGATIVE NEGATIVE Final    Comment: (NOTE) SARS-CoV-2 target nucleic acids are NOT DETECTED. The SARS-CoV-2 RNA is generally detectable in upper and lower respiratory specimens during the acute phase of infection. Negative results do not preclude SARS-CoV-2 infection, do not rule out co-infections with other pathogens, and should not be used as the sole basis for treatment or other patient management decisions. Negative results must be combined with clinical observations, patient history, and epidemiological information. The expected result is Negative. Fact Sheet for Patients: SugarRoll.be  Fact Sheet for Healthcare Providers: https://www.woods-mathews.com/ This test is not yet approved or cleared by the [place] FDA and  has been authorized for detection and/or diagnosis of SARS-CoV-2 by FDA under an Emergency Use Authorization (EUA). This EUA will remain  in effect (meaning this test can be used) for the duration of the COVID-19 declaration under Section 56 4(b)(1) of the Act, 21 U.S.C. section 360bbb-3(b)(1), unless the authorization is terminated or revoked sooner. Performed at [facility], [place] [address] [place], [place] [address]      Labs: BNP (last 3 results) No results for input(s): BNP in the last 8760 hours. Basic Metabolic Panel: Recent Labs  Lab 09/22/19 1240 09/23/19 0623 09/26/19 1300 09/26/19 2135 09/27/19 0539  NA 140 138 138  --  140  K 3.8 4.0 4.0   --  3.8  CL 104 103 100  --  102  CO2 26 26 27   --  28  GLUCOSE 140* 117* 108*  --  99  BUN 16 16 18   --  17  CREATININE 0.92 1.00 1.16 0.96 1.01  CALCIUM 8.6* 8.7* 9.2  --  8.8*  MG 1.9  --   --   --   --    Liver Function Tests: No results for input(s): AST, ALT, ALKPHOS, BILITOT, PROT, ALBUMIN in the last 168 hours. No results for input(s): LIPASE, AMYLASE in the last 168 hours. No results for input(s): AMMONIA in the last 168 hours. CBC: Recent Labs  Lab 09/22/19 1240 09/23/19 0623 09/26/19 1300 09/26/19 2135 09/27/19 0539  WBC 10.7* 11.9* 9.4 9.7 8.7  NEUTROABS  --   --   --   --  6.2  HGB 11.7* 10.9* 12.0* 12.3* 11.7*  HCT 36.6* 33.3* 38.3* 38.5* 36.3*  MCV 93.6 91.0 94.1 92.5 92.1  PLT 177 151 142* 148* 150   Cardiac Enzymes: No results for input(s): CKTOTAL, CKMB, CKMBINDEX, TROPONINI in the last 168 hours. BNP: Invalid input(s): POCBNP CBG: No results for input(s): GLUCAP in the last 168 hours. D-Dimer No results for input(s): DDIMER in the last 72 hours. Hgb A1c No results for input(s): HGBA1C in the last 72 hours. Lipid Profile No results for input(s): CHOL, HDL, LDLCALC, TRIG, CHOLHDL, LDLDIRECT in the last 72 hours. Thyroid function studies No results for input(s): TSH, T4TOTAL, T3FREE, THYROIDAB in the last 72 hours.  Invalid input(s): FREET3 Anemia work up No results for input(s): VITAMINB12, FOLATE, FERRITIN, TIBC, IRON, RETICCTPCT in the last 72 hours. Urinalysis    Component Value Date/Time   COLORURINE YELLOW 09/22/2019 0728   APPEARANCEUR HAZY (A) 09/22/2019 0728   LABSPEC 1.011 09/22/2019 0728   PHURINE 6.0 09/22/2019 0728   GLUCOSEU NEGATIVE 09/22/2019 0728   HGBUR NEGATIVE 09/22/2019 0728   BILIRUBINUR NEGATIVE 09/22/2019 0728   KETONESUR NEGATIVE 09/22/2019 0728   PROTEINUR NEGATIVE 09/22/2019 0728   UROBILINOGEN 0.2 06/09/2009 0918   NITRITE POSITIVE (A) 09/22/2019 0728   LEUKOCYTESUR LARGE (A) 09/22/2019 0728   Sepsis  Labs Invalid input(s): PROCALCITONIN,  WBC,  LACTICIDVEN Microbiology Recent Results (from the past 240 hour(s))  SARS CORONAVIRUS 2 (TAT 6-24 HRS) Nasopharyngeal Nasopharyngeal Swab     Status: None   Collection Time: 09/19/19 12:52 PM   Specimen: Nasopharyngeal Swab  Result Value Ref Range Status   SARS Coronavirus 2 NEGATIVE NEGATIVE Final    Comment: (NOTE) SARS-CoV-2 target nucleic acids are NOT DETECTED. The SARS-CoV-2 RNA is generally detectable in upper and lower respiratory specimens during the acute  phase of infection. Negative results do not preclude SARS-CoV-2 infection, do not rule out co-infections with other pathogens, and should not be used as the sole basis for treatment or other patient management decisions. Negative results must be combined with clinical observations, patient history, and epidemiological information. The expected result is Negative. Fact Sheet for Patients: SugarRoll.be Fact Sheet for Healthcare Providers: https://www.woods-mathews.com/ This test is not yet approved or cleared by the [place] FDA and  has been authorized for detection and/or diagnosis of SARS-CoV-2 by FDA under an Emergency Use Authorization (EUA). This EUA will remain  in effect (meaning this test can be used) for the duration of the COVID-19 declaration under Section 56 4(b)(1) of the Act, 21 U.S.C. section 360bbb-3(b)(1), unless the authorization is terminated or revoked sooner. Performed at [facility], [place] [address], [place], [place] [address]   SARS CORONAVIRUS 2 (TAT 6-24 HRS) Nasopharyngeal Nasopharyngeal Swab     Status: None   Collection Time: 09/26/19  9:35 PM   Specimen: Nasopharyngeal Swab  Result Value Ref Range Status   SARS Coronavirus 2 NEGATIVE NEGATIVE Final    Comment: (NOTE) SARS-CoV-2 target nucleic acids are NOT DETECTED. The SARS-CoV-2 RNA is generally detectable in upper and lower respiratory  specimens during the acute phase of infection. Negative results do not preclude SARS-CoV-2 infection, do not rule out co-infections with other pathogens, and should not be used as the sole basis for treatment or other patient management decisions. Negative results must be combined with clinical observations, patient history, and epidemiological information. The expected result is Negative. Fact Sheet for Patients: SugarRoll.be Fact Sheet for Healthcare Providers: https://www.woods-mathews.com/ This test is not yet approved or cleared by the [place] FDA and  has been authorized for detection and/or diagnosis of SARS-CoV-2 by FDA under an Emergency Use Authorization (EUA). This EUA will remain  in effect (meaning this test can be used) for the duration of the COVID-19 declaration under Section 56 4(b)(1) of the Act, 21 U.S.C. section 360bbb-3(b)(1), unless the authorization is terminated or revoked sooner. Performed at [facility], [place] [address], [place], [place] [address]      Time coordinating discharge: Over 30 minutes  SIGNED:   Darliss Cheney, MD  [facility] 09/27/2019, 4:43 PM  If 7PM-7AM, please contact night-coverage [email or web address]

## 2019-09-27 NOTE — Progress Notes (Addendum)
Pt's waiting to hear from Cardiology about his test results (stress test, echo). Discharge order in. Updated oncoming RN

## 2019-09-27 NOTE — Consult Note (Addendum)
Cardiology Consultation:   Patient ID: Andrew [name] MRN: [id number]; DOB: 06/08/1932  Admit date: 09/26/2019 Date of Consult: 09/27/2019  Primary Care Provider: Carlyle Basques, MD Primary Cardiologist: Pixie Casino, MD   Primary Electrophysiologist:  None     Patient Profile:   Andrew [name] is a 86 y.o. male with a hx of coronary artery disease s/p prior PCI remotely and subsequent CABG in 2010, thoracic aortic aneurysm s/p endovascular repair 09/22/2019, chronic kidney disease, hypertension, PVCs (12% on monitor in 12/2018), reported remote hx of AFib, hyperlipidemia, RBBB who is being seen today for the evaluation of chest pain  at the request of Dr. Hal Hope.  History of Present Illness:   Andrew [name] was previously followed by Dr. Wynonia Lawman and established with Dr. Debara Pickett in March 2020. He was last seen by Dr. Debara Pickett in 05/2019.  He has a hx of coronary artery disease with remote PCI.  He ultimately underwent CABG in 2010.  It has been noted that atrial fibrillation is listed in his hx, but no evidence of this recently and he is not on long term anticoagulation.  He has a thoracic aortic aneurysm and underwent endovascular repair with Dr. Carlis Abbott last week.  He present to the ED yesterday via EMS for complaints of chest pain.  Chest CTA did not show any sign of endoleak.  This was reviewed by Dr. Carlis Abbott who saw the patient and felt that his aneurysm repair is stable.    Data: K 3.8, Creatinine 1.01 Hs-Trop: 8 >> 7 >> 22 >> 35 Hgb 12 >> 12.3 >> 11.7 SARS-CoV-2: Neg ECG (personally reviewed/interpreted): normal sinus rhythm, HR 73, normal axis, RBBB, No acute ST-TW changes, QTc 459  The patient notes he has been sore since his procedure.  Two days ago, he had some mild chest pain and thought it was probably related to his surgery.  He had more severe substernal chest pain yesterday AM described as a tightness.  He has no assoc shortness of breath, nausea, diaphoresis, radiating symptoms.   He does not feel that his chest pain is like his prior angina, but it was so long ago he cannot really remember.  He works out at [employer] 5 days a week without limitations.  He has not had any recent exertional chest pain, exertional shortness of breath.  He has not had orthopnea, paroxysmal nocturnal dyspnea, leg swelling or syncope.     Past Medical History:  Diagnosis Date  . Angioedema 06/01/2015  . Arthritis   . Back pain   . 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 Chronic kidney disease    ? bph, pt self catheterizes himself on schedule  . Complication of anesthesia    had to have Foley post op cabg and lumb lam  . Coronary artery disease    s/p remote PCI // s/p CABG in 2010 Echo 01/2019: EF 60-65, Gr 1 DD, normal RVSF, RVSP 36, mild LAE, mild to mod TR  . Hypertension   . PVC's (premature ventricular contractions)    Monitor 12/2018:  Sinus rhythm with frequent PVC's (11.5%) and periods of bigeminy.   . Thoracic aortic aneurysm ([place])    09/29/16 CT: 4.3 cm ascending, 4.5 cm descending. 1 yr f/u rec // s/p endovascular repair 09/2019 (Dr. Carlis Abbott)  . Urinary retention   . Wears glasses     Past Surgical History:  Procedure Laterality Date  . BACK SURGERY  2012   lumb lam  . CARDIAC CATHETERIZATION     2010-per patient  . COLONOSCOPY    . CORONARY ARTERY BYPASS GRAFT     2010  . EPIGASTRIC HERNIA REPAIR N/A 09/11/2017   Procedure: HERNIA REPAIR EPIGASTRIC ADULT;  Surgeon: Rolm Bookbinder, MD;  Location: [facility];  Service: General;  Laterality: N/A;  . HERNIA REPAIR     rih 1981  . HERNIA REPAIR  09/11/2017   with mesh  . INGUINAL HERNIA REPAIR  07/30/2012   Procedure: HERNIA REPAIR INGUINAL ADULT;  Surgeon: Rolm Bookbinder, MD;  Location: [facility];  Service: General;  Laterality: Left;  Left Hernia Site  . INSERTION OF MESH  07/30/2012   Procedure: INSERTION OF MESH;  Surgeon: Rolm Bookbinder, MD;  Location: [place];  Service: General;  Laterality: Left;  Left Inguinal Hernia  . INSERTION OF MESH N/A 09/11/2017   Procedure: INSERTION OF MESH;  Surgeon: Rolm Bookbinder, MD;  Location: [facility];  Service: General;  Laterality: N/A;  . LAPAROSCOPY N/A 09/11/2017   Procedure: DIAGNOSTIC LAPAOSCOPY;  Surgeon: Rolm Bookbinder, MD;  Location: [facility];  Service: General;  Laterality: N/A;  ERAS pathway  . SPINE SURGERY     lumbar laminectomy  . THORACIC AORTIC ENDOVASCULAR STENT GRAFT N/A 09/22/2019   Procedure: THORACIC AORTIC ENDOVASCULAR STENT GRAFT;  Surgeon: Marty Heck, MD;  Location: [facility];  Service: Vascular;  Laterality: N/A;  . TONSILLECTOMY       Home Medications:  Prior to Admission medications   Medication Sig Start Date End Date Taking? Authorizing Provider  aspirin 81 MG tablet Take 81 mg by mouth daily after supper.    Yes [provider]  atorvastatin (LIPITOR) 20 MG tablet TAKE 1 TABLET ONCE DAILY AT 6 PM. Patient taking differently: Take 20 mg by mouth daily at 6 PM.  02/18/19  Yes Hilty, Nadean Corwin, MD  Cholecalciferol (VITAMIN D3) 1000 units CAPS Take 1,000 Units by mouth daily.   Yes [provider]  Co-Enzyme Q-10 100 MG CAPS Take 100 mg by mouth daily.    Yes [provider]  CRANBERRY EXTRACT PO Take 1 tablet by mouth daily.   Yes [provider]  DILT-XR 180 MG 24 hr capsule TAKE 1 CAPSULE DAILY. Patient taking differently: Take 180 mg by mouth daily.  07/21/19  Yes Hilty, Nadean Corwin, MD  ferrous sulfate 325 (65 FE) MG tablet Take 325 mg by mouth daily with breakfast.   Yes [provider]  gabapentin (NEURONTIN) 300 MG capsule Take 300 mg by mouth at bedtime.  04/18/12  Yes [provider]  ibuprofen (ADVIL) 200 MG tablet Take 200-400 mg by mouth every 6 (six) hours as needed (for pain).   Yes [provider]  loratadine (CLARITIN) 10 MG tablet Take 10 mg by mouth daily as needed for allergies or  rhinitis.   Yes [provider]  Multiple Vitamins-Minerals (PRESERVISION AREDS 2 PO) Take 1 capsule by mouth 2 (two) times daily.   Yes [provider]  nitroGLYCERIN (NITROSTAT) 0.6 MG SL tablet Place 0.6 mg under the tongue every 5 (five) minutes as needed for chest pain.   Yes [provider]  oxyCODONE-acetaminophen (PERCOCET) 5-325 MG tablet Take 1 tablet by mouth every 6 (six) hours as needed for severe pain. 09/24/19 09/23/20 Yes Baglia, Corrina, PA-C  oxymetazoline (AFRIN) 0.05 % nasal spray Place 1 spray into both nostrils at bedtime as needed for congestion.  Yes [provider]  sodium chloride (MURO 128) 5 % ophthalmic ointment Place 1 application into both eyes at bedtime.   Yes [provider]  triamcinolone (NASACORT ALLERGY 24HR) 55 MCG/ACT AERO nasal inhaler Place 2 sprays into the nose daily as needed (for allergies).    Yes [provider]  TURMERIC PO Take 1 capsule by mouth daily after breakfast.   Yes [provider]  vitamin B-12 (CYANOCOBALAMIN) 1000 MCG tablet Take 1,000 mcg by mouth daily.    Yes [provider]  vitamin C (ASCORBIC ACID) 250 MG tablet Take 250 mg by mouth daily.    Yes [provider]  nitroGLYCERIN (NITRODUR - DOSED IN MG/24 HR) 0.2 mg/hr patch Use 1/2 patch daily to the affected area Patient not taking: Reported on 09/26/2019 10/11/18   Dene Gentry, MD    Inpatient Medications: Scheduled Meds: . aspirin  81 mg Oral QPC supper  . atorvastatin  20 mg Oral q1800  . diltiazem  180 mg Oral Daily  . enoxaparin (LOVENOX) injection  40 mg Subcutaneous Q24H  . ferrous sulfate  325 mg Oral Q breakfast  . gabapentin  300 mg Oral QHS  . vitamin B-12  1,000 mcg Oral Daily   Continuous Infusions:  PRN Meds: acetaminophen **OR** acetaminophen, nitroGLYCERIN, ondansetron **OR** ondansetron (ZOFRAN) IV, oxyCODONE-acetaminophen  Allergies:    Allergies  Allergen Reactions  .  Quinolones Other (See Comments)    Patient has an abdominal aortic aneurysm     Social History:   Social History   Socioeconomic History  . Marital status: Married    Spouse name: Not on file  . Number of children: Not on file  . Years of education: Not on file  . Highest education level: Not on file  Occupational History  . Not on file  Tobacco Use  . Smoking status: Former Smoker    Packs/day: 1.00    Years: 8.00    Pack years: 8.00    Quit date: 07/24/1984    Years since quitting: 35.2  . Smokeless tobacco: Never Used  Substance and Sexual Activity  . Alcohol use: No  . Drug use: No  . Sexual activity: Not on file  Other Topics Concern  . Not on file  Social History Narrative  . Not on file   Social Determinants of Health   Financial Resource Strain:   . Difficulty of Paying Living Expenses: Not on file  Food Insecurity:   . Worried About Charity fundraiser in the Last Year: Not on file  . Ran Out of Food in the Last Year: Not on file  Transportation Needs:   . Lack of Transportation (Medical): Not on file  . Lack of Transportation (Non-Medical): Not on file  Physical Activity:   . Days of Exercise per Week: Not on file  . Minutes of Exercise per Session: Not on file  Stress:   . Feeling of Stress : Not on file  Social Connections:   . Frequency of Communication with Friends and Family: Not on file  . Frequency of Social Gatherings with Friends and Family: Not on file  . Attends Religious Services: Not on file  . Active Member of Clubs or Organizations: Not on file  . Attends Archivist Meetings: Not on file  . Marital Status: Not on file  Intimate Partner Violence:   . Fear of Current or Ex-Partner: Not on file  . Emotionally Abused: Not on file  . Physically  Abused: Not on file  . Sexually Abused: Not on file    Family History:    Family History  Problem Relation Age of Onset  . Allergic rhinitis Son   . Coronary artery disease Mother     . Angioedema Neg Hx   . Asthma Neg Hx   . Atopy Neg Hx   . Eczema Neg Hx   . Immunodeficiency Neg Hx   . Urticaria Neg Hx      ROS:  Please see the history of present illness.  No fever, chills, cough, melena, hematochezia, hematuria, vomiting, diarrhea. All other ROS reviewed and negative.     Physical Exam/Data:   Vitals:   09/26/19 2105 09/26/19 2130 09/26/19 2234 09/27/19 0639  BP:  132/90 131/88 108/85  Pulse:  76  89  Resp:  15    Temp:   99.5 F (37.5 C) 97.6 F (36.4 C)  TempSrc:   Oral Oral  SpO2: 94% 94%  92%  Weight:   63.6 kg 62.6 kg  Height:   5\' 9"  (1.753 m)     Intake/Output Summary (Last 24 hours) at 09/27/2019 0800 Last data filed at 09/27/2019 0000 Gross per 24 hour  Intake 360 ml  Output 1500 ml  Net -1140 ml   Last 3 Weights 09/27/2019 09/26/2019 09/26/2019  Weight (lbs) 138 lb 1.6 oz 140 lb 3.2 oz 144 lb 10 oz  Weight (kg) 62.642 kg 63.594 kg 65.6 kg     Body mass index is 20.39 kg/m.  General:  Well nourished, well developed, in no acute distress  HEENT: normal Lymph: no adenopathy Neck: no JVD Endocrine:  No thryomegaly Vascular: No carotid bruits   Cardiac:  normal S1, S2; RRR; early 1/6 diastolic murmur RUSB Lungs:  clear to auscultation bilaterally, no wheezing, rhonchi or rales  Abd: soft, nontender, no hepatomegaly  Ext: no edema Musculoskeletal:  No deformities, BUE and BLE strength normal and equal Skin: warm and dry  Neuro:  CNs 2-12 intact, no focal abnormalities noted Psych:  Normal affect   EKG:  The EKG was personally reviewed and demonstrates:  See HPI Telemetry:  Telemetry was personally reviewed and demonstrates:  Normal sinus rhythm   Relevant CV Studies: n/a  Laboratory Data:  High Sensitivity Troponin:   Recent Labs  Lab 09/26/19 1300 09/26/19 1534 09/26/19 2135 09/27/19 0011  TROPONINIHS 8 7 22* 35*     Chemistry Recent Labs  Lab 09/23/19 0623 09/23/19 0623 09/26/19 1300 09/26/19 2135 09/27/19 0539  NA  138  --  138  --  140  K 4.0  --  4.0  --  3.8  CL 103  --  100  --  102  CO2 26  --  27  --  28  GLUCOSE 117*  --  108*  --  99  BUN 16  --  18  --  17  CREATININE 1.00   < > 1.16 0.96 1.01  CALCIUM 8.7*  --  9.2  --  8.8*  GFRNONAA >60   < > 57* >60 >60  GFRAA >60   < > >60 >60 >60  ANIONGAP 9  --  11  --  10   < > = values in this interval not displayed.    No results for input(s): PROT, ALBUMIN, AST, ALT, ALKPHOS, BILITOT in the last 168 hours. Hematology Recent Labs  Lab 09/26/19 1300 09/26/19 2135 09/27/19 0539  WBC 9.4 9.7 8.7  RBC 4.07* 4.16* 3.94*  HGB  12.0* 12.3* 11.7*  HCT 38.3* 38.5* 36.3*  MCV 94.1 92.5 92.1  MCH 29.5 29.6 29.7  MCHC 31.3 31.9 32.2  RDW 14.3 14.2 14.2  PLT 142* 148* 150    Radiology/Studies:  DG Chest Portable 1 View Result Date: 09/26/2019 IMPRESSION: Stable right basilar atelectasis or infiltrate is noted with probable small pleural effusion.  Electronically Signed   By: Marijo Conception M.D.   On: 09/26/2019 13:22   CT Angio Chest/Abd/Pel for Dissection W and/or Wo Contrast Result Date: 09/26/2019 IMPRESSION:  1. No evidence of pulmonary embolus.  2. Mild to moderate severity atelectasis and/or infiltrate along the posterior and lateral aspects of the right lung base.  3. Small right pleural effusion.  4. Stable, approximately 4.9 cm x 4.6 cm infrarenal abdominal aortic aneurysm.  5. Stable marked severity narrowing of the origin of the celiac artery, superior mesenteric artery and bilateral renal arteries with occlusion of the IMA and distal reconstitution of flow.  6. Prior stenting of the thoracic aorta without evidence of stent occlusion or stent leak.  7. Marked severity coronary artery calcification.  Aortic Atherosclerosis (ICD10-I70.0).  Electronically Signed   By: Virgina Norfolk M.D.   On: 09/26/2019 18:49       HEAR Score (for undifferentiated chest pain):  HEAR Score: 6    Assessment and Plan:   1. Chest pain  The  patient has a hx of coronary artery disease s/p CABG.  He just underwent endovascular repair for a thoracic aortic aneurysm.  CT did not show evidence of endo-leak or pulmonary embolism.  His ECG did not show any acute changes.  His initial hs-Trop was neg. Follow up hs-Trop's are higher with no significant delta (in the second set).  His symptoms were relieved by NTG.  Prior to this episode, he has been active and exercises almost daily.  He is currently pain free.  At this point, inpatient stress testing seems appropriate to further evaluate his symptoms.  He is currently NPO  -Arrange Lexiscan Myoview this AM  2. Coronary artery disease  S/p CABG in 2010.  As noted, he has presented with prolonged chest pain relieved by NTG.  He has minimal Troponin elevation.  His syndrome is not clearly c/w ACS.  We will proceed with stress testing as noted.  Continue ASA, statin.   3. Hypertension  BP controlled.  Continue Cardizem.  4. Hyperlipidemia  Continue statin Rx with Atorvastatin 20 mg once daily.   For questions or updates, please contact [place] Please consult [email or web address] for contact info under    Signed, Richardson Dopp, PA-C  09/27/2019 8:00 AM   Andrew [name] 86 yo male history of CAD with prior CABG in 2010, remote prior angioplasty and PCI , endovascular repair of descending thoracic aortic aneurysm 09/22/19. History of CKD, HTN admitted with chest pain  Describes approx 4 episodes over the last 2 days. Pressure mid to left chest, 8/10 in severity without associated symtoms. Lasts about 20 minutes. No specifically exertional. Not positional, not related to food. Did take a NG at home with some improvement. Had not had any prior chest pains within his recent memory.    K 4 Cr 1.16 BUN 18 WBC 9.4 Hgb 12 Plt 142  hstrop 8-->7-->22-->35--> EKG SR, long first degree AV block, RBBB. No acute ischemic changes.  COVID neg CXR Stable right basilar atelectasis or infiltrate is noted  with probable small pleural effusion.  CTA C/A/P: no PE. Mild to moderate atelectasis vs  infiltrate RLL, stable infrarenal AAA, normal stented distal thoracic aorta  01/2019 echo: LVEF 60-65%, grade I DDX, normal RV   Patient presents with chest pain. Recent endovascular descending aortic aneurysm repair, CT imaging shows normal repair. History of CAD with prior interventions and CABG. EKG without specific acute changes, mild troponin with very mild uptrend. Currently patient is pain free. We will plan for a lexiscan today to further evaluate. Also repeat echo.   Addendum Echo with normal LVEF, no WMAs. Mild trop that has come back down. Stress test without clear ischemia, technical issues I think explains the elvated TID. Overall clinical picture is reassuring, would start imdur 15mg  daily and d/c home.    Carlyle Dolly MD

## 2019-09-27 NOTE — Progress Notes (Signed)
  Lexiscan Myoview Pharmacologic stress portion completed. Images are currently pending. Signed,  Richardson Dopp, PA-C   09/27/2019 11:16 AM

## 2019-10-06 ENCOUNTER — Telehealth: Payer: Self-pay | Admitting: Internal Medicine

## 2019-10-06 NOTE — Telephone Encounter (Signed)
Returned call to patient.He stated for the past couple of days he has noticed heart skipping.Stated is not beating fast, just skipping at times.Stated he recently had aortic aneurysm surgery.He went to ED a week ago with chest pain.He takes gas x that relieves chest pain.No chest pain at present.No extender appointments.Appointment scheduled with Dr.Hilty 3/18 at 1:30 pm.Advised to go to ED if he becomes worse.

## 2019-10-06 NOTE — Telephone Encounter (Signed)
Patient c/o Palpitations:  High priority if patient c/o lightheadedness, shortness of breath, or chest pain  1) How long have you had palpitations/irregular HR/ Afib? Are you having the symptoms now? Palpitations.  2) Are you currently experiencing lightheadedness, SOB or CP? No denied symptoms right now but states these symptoms happen occasionally.    3) Do you have a history of afib (atrial fibrillation) or irregular heart rhythm? Yes, palpitations, somewhat but not like it is now  4) Have you checked your BP or HR? (document readings if available): HR 68. Said he will check BP when we get off the phone.   5) Are you experiencing any other symptoms? No. Patient states that he went to [facility] for chest pain. He was to f/u with Dr. Debara Pickett. There are no more openings in office with Dr. Debara Pickett or an APP. Patient does not want to do virtual. He was to f/u 2 weeks from 09/22/19.

## 2019-10-09 ENCOUNTER — Encounter: Payer: Self-pay | Admitting: Internal Medicine

## 2019-10-09 ENCOUNTER — Other Ambulatory Visit: Payer: Self-pay

## 2019-10-09 ENCOUNTER — Ambulatory Visit: Payer: Medicare Other | Admitting: Internal Medicine

## 2019-10-09 VITALS — BP 146/73 | HR 73 | Temp 97.4°F | Resp 14 | Ht 69.0 in | Wt 147.2 lb

## 2019-10-09 DIAGNOSIS — I493 Ventricular premature depolarization: Secondary | ICD-10-CM

## 2019-10-09 DIAGNOSIS — I10 Essential (primary) hypertension: Secondary | ICD-10-CM | POA: Diagnosis not present

## 2019-10-09 DIAGNOSIS — Z951 Presence of aortocoronary bypass graft: Secondary | ICD-10-CM | POA: Diagnosis not present

## 2019-10-09 DIAGNOSIS — I25810 Atherosclerosis of coronary artery bypass graft(s) without angina pectoris: Secondary | ICD-10-CM

## 2019-10-09 NOTE — Progress Notes (Signed)
OFFICE NOTE  Chief Complaint:  Follow-up  Primary Care Physician: [name], MD  HPI:  Andrew [name] is a 86 y.o. male with a past medial history significant for coronary artery disease status post CABG in 2010 with prior history of angioplasty and PCI dating back at least 10 years before that.  He also has history of thoracic aortic aneurysm with a 4.3 cm ascending and 4.5 cm descending aneurysm.  Chronic kidney disease, hypertension, arthritis, skin cancer, and ongoing back problem which she is having evaluated for injection.  There is a listed a history of paroxysmal atrial fibrillation however I cannot find evidence to support this.  He is worn a monitor, last in 2017 which showed PACs and PVCs.  There is no A. fib today.  He does have PVCs.  He is not anticoagulated beyond aspirin.  Testing from his PCP showed total cholesterol 150, HDL 53, LDL 83 and triglycerides 74.  01/15/2019  Andrew [name] returns today for follow-up.  He was previously followed by Dr. Wynonia Lawman and establish care with me in March 2020 prior to the [place] pandemic.  He underwent scheduled repeat CT angiography for aortic aneurysm.  This demonstrated stable 4.2 cm ascending thoracic aortic aneurysm, however did also demonstrate an enlarging 5.3 cm mid descending thoracic aortic aneurysm (previously measuring 4.8 cm).  Referral to cardiothoracic surgery was recommended.  Additionally, there were incidentally noted mild T8 and T9 vertebral body compression deformities.  Symptomatically he is doing fairly well.  He complains of some left knee pain left lower leg swelling.  Today he is noted to be in ventricular bigeminy, on EKG.  He is noted recently having more frequent palpitations.  He says he has a longstanding history of palpitations and has been monitored for that several times in the past.  06/23/2019  Andrew [name] is seen today for follow-up.  He had been seen in the interim by Dr. Roxan Hockey with who will  follow up his thoracic aortic aneurysm.  In addition he was referred to Dr. Carlis Abbott with vascular surgery to follow-up an abdominal aortic aneurysm.  From a cardiac standpoint he is doing well.  When I last saw him he was having frequent ectopy and wore a monitor which showed PVCs at a frequency of close to 12%.  An echo was performed which showed normal systolic function.  He said this went on for a while about 2 months and then stopped pretty abruptly.  Since that time he is not had any further palpitations and his EKG today shows no evidence of any PVCs.  10/09/2019  Andrew [name] is seen today in follow-up.  Recently he underwent aneurysm surgery.  He has been having some intermittent chest discomfort.  He was seen in the hospital for this by Dr. Harl Bowie.  He ultimately underwent nuclear stress testing which was negative for ischemia.  Today in follow-up he says he is improving somewhat.  Blood pressure is little elevated today.  He has follow-up with Dr. Carlis Abbott coming up in late March.  EKG today shows sinus rhythm with some PVCs.  PMHx:  Past Medical History:  Diagnosis Date  . Angioedema 06/01/2015  . Arthritis   . Back pain   . 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 Chronic kidney disease    ? bph, pt self catheterizes himself on schedule  . Complication of anesthesia    had to have Foley post op cabg  and lumb lam  . Coronary artery disease    s/p remote PCI // s/p CABG in 2010 Echo 01/2019: EF 60-65, Gr 1 DD, normal RVSF, RVSP 36, mild LAE, mild to mod TR  . Hypertension   . PVC's (premature ventricular contractions)    Monitor 12/2018:  Sinus rhythm with frequent PVC's (11.5%) and periods of bigeminy.   . Thoracic aortic aneurysm ([place])    09/29/16 CT: 4.3 cm ascending, 4.5 cm descending. 1 yr f/u rec // s/p endovascular repair 09/2019 (Dr. Carlis Abbott)  . Urinary retention   . Wears glasses     Past Surgical History:  Procedure Laterality Date  . BACK  SURGERY  2012   lumb lam  . CARDIAC CATHETERIZATION     2010-per patient  . COLONOSCOPY    . CORONARY ARTERY BYPASS GRAFT     2010  . EPIGASTRIC HERNIA REPAIR N/A 09/11/2017   Procedure: HERNIA REPAIR EPIGASTRIC ADULT;  Surgeon: Rolm Bookbinder, MD;  Location: [facility];  Service: General;  Laterality: N/A;  . HERNIA REPAIR     rih 1981  . HERNIA REPAIR  09/11/2017   with mesh  . INGUINAL HERNIA REPAIR  07/30/2012   Procedure: HERNIA REPAIR INGUINAL ADULT;  Surgeon: Rolm Bookbinder, MD;  Location: [facility];  Service: General;  Laterality: Left;  Left Hernia Site  . INSERTION OF MESH  07/30/2012   Procedure: INSERTION OF MESH;  Surgeon: Rolm Bookbinder, MD;  Location: [facility];  Service: General;  Laterality: Left;  Left Inguinal Hernia  . INSERTION OF MESH N/A 09/11/2017   Procedure: INSERTION OF MESH;  Surgeon: Rolm Bookbinder, MD;  Location: [place] [facility];  Service: General;  Laterality: N/A;  . LAPAROSCOPY N/A 09/11/2017   Procedure: DIAGNOSTIC LAPAOSCOPY;  Surgeon: Rolm Bookbinder, MD;  Location: [facility];  Service: General;  Laterality: N/A;  ERAS pathway  . SPINE SURGERY     lumbar laminectomy  . THORACIC AORTIC ENDOVASCULAR STENT GRAFT N/A 09/22/2019   Procedure: THORACIC AORTIC ENDOVASCULAR STENT GRAFT;  Surgeon: Marty Heck, MD;  Location: [facility] OR;  Service: Vascular;  Laterality: N/A;  . TONSILLECTOMY      FAMHx:  Family History  Problem Relation Age of Onset  . Allergic rhinitis Son   . Coronary artery disease Mother   . Angioedema Neg Hx   . Asthma Neg Hx   . Atopy Neg Hx   . Eczema Neg Hx   . Immunodeficiency Neg Hx   . Urticaria Neg Hx     SOCHx:   reports that he quit smoking about 35 years ago. He has a 8.00 pack-year smoking history. He has never used smokeless tobacco. He reports that he does not drink alcohol or use drugs.  ALLERGIES:  Allergies  Allergen Reactions  . Quinolones Other (See Comments)    Patient has an  abdominal aortic aneurysm     ROS: Pertinent items noted in HPI and remainder of comprehensive ROS otherwise negative.  HOME MEDS: Current Outpatient Medications on File Prior to Visit  Medication Sig Dispense Refill  . aspirin 81 MG tablet Take 81 mg by mouth daily after supper.     Marland Kitchen atorvastatin (LIPITOR) 20 MG tablet TAKE 1 TABLET ONCE DAILY AT 6 PM. (Patient taking differently: Take 20 mg by mouth daily at 6 PM. ) 90 tablet 2  . azithromycin (ZITHROMAX) 250 MG tablet     . Cholecalciferol (VITAMIN D3) 1000 units CAPS Take 1,000 Units by mouth daily.    Marland Kitchen  Co-Enzyme Q-10 100 MG CAPS Take 100 mg by mouth daily.     Marland Kitchen CRANBERRY EXTRACT PO Take 1 tablet by mouth daily.    Marland Kitchen DILT-XR 180 MG 24 hr capsule TAKE 1 CAPSULE DAILY. (Patient taking differently: Take 180 mg by mouth daily. ) 30 capsule 5  . ferrous sulfate 325 (65 FE) MG tablet Take 325 mg by mouth daily with breakfast.    . gabapentin (NEURONTIN) 300 MG capsule Take 300 mg by mouth at bedtime.     Marland Kitchen ibuprofen (ADVIL) 200 MG tablet Take 200-400 mg by mouth every 6 (six) hours as needed (for pain).    . isosorbide mononitrate (IMDUR) 30 MG 24 hr tablet Take 0.5 tablets (15 mg total) by mouth daily. 15 tablet 0  . loratadine (CLARITIN) 10 MG tablet Take 10 mg by mouth daily as needed for allergies or rhinitis.    . Multiple Vitamins-Minerals (PRESERVISION AREDS 2 PO) Take 1 capsule by mouth 2 (two) times daily.    . nitroGLYCERIN (NITRODUR - DOSED IN MG/24 HR) 0.2 mg/hr patch Use 1/2 patch daily to the affected area 30 patch 1  . nitroGLYCERIN (NITROSTAT) 0.6 MG SL tablet Place 0.6 mg under the tongue every 5 (five) minutes as needed for chest pain.    Marland Kitchen oxyCODONE-acetaminophen (PERCOCET) 5-325 MG tablet Take 1 tablet by mouth every 6 (six) hours as needed for severe pain. 15 tablet 0  . oxymetazoline (AFRIN) 0.05 % nasal spray Place 1 spray into both nostrils at bedtime as needed for congestion.    . sodium chloride (MURO 128) 5 %  ophthalmic ointment Place 1 application into both eyes at bedtime.    . triamcinolone (NASACORT ALLERGY 24HR) 55 MCG/ACT AERO nasal inhaler Place 2 sprays into the nose daily as needed (for allergies).     . TURMERIC PO Take 1 capsule by mouth daily after breakfast.    . vitamin B-12 (CYANOCOBALAMIN) 1000 MCG tablet Take 1,000 mcg by mouth daily.     . vitamin C (ASCORBIC ACID) 250 MG tablet Take 250 mg by mouth daily.      No current facility-administered medications on file prior to visit.    LABS/IMAGING: No results found for this or any previous visit (from the past 48 hour(s)). No results found.  LIPID PANEL: No results found for: CHOL, TRIG, HDL, CHOLHDL, VLDL, LDLCALC, LDLDIRECT   WEIGHTS: Wt Readings from Last 3 Encounters:  10/09/19 147 lb 3.2 oz (66.8 kg)  09/27/19 138 lb 1.6 oz (62.6 kg)  09/24/19 144 lb 10 oz (65.6 kg)    VITALS: BP (!) 146/73   Pulse 73   Temp (!) 97.4 F (36.3 C)   Resp 14   Ht 5\' 9"  (1.753 m)   Wt 147 lb 3.2 oz (66.8 kg)   SpO2 96%   BMI 21.74 kg/m   EXAM: General appearance: alert and no distress Neck: no carotid bruit, no JVD and thyroid not enlarged, symmetric, no tenderness/mass/nodules Lungs: clear to auscultation bilaterally Heart: regular rate and rhythm, S1, S2 normal, no murmur, click, rub or gallop Abdomen: soft, non-tender; bowel sounds normal; no masses,  no organomegaly Extremities: extremities normal, atraumatic, no cyanosis or edema Pulses: 2+ and symmetric Skin: Skin color, texture, turgor normal. No rashes or lesions Neurologic: Grossly normal Psych: Pleasant  EKG: Sinus rhythm first-degree AV block at 62, RBBB-personally reviewed  ASSESSMENT: 1. Chest pain-recent negative Myoview stress test and (09/27/2019) 2. Coronary artery disease status post CABG x4 with LIMA to  LAD, free RIMA to RCA and left radial to OM (04/2009) 3. Remote history of atrial fibrillation-not anticoagulated 4. Hyperlipidemia 5. History of  second-degree AV block 6. Thoracic aortic aneurysm 7. Chronic venous insufficiency 8. Ventricular bigeminy - (burden of 12% in 12/2018, resolved) 9. RBBB  PLAN: 1.   Mr. Schoettle recently had some recurrent chest pain.  He underwent Myoview stress testing on 09/27/2019 which was negative for ischemia.  I suspect this may still be related to his surgery.  Today he reports feeling somewhat better.  He is noted to have a PVC in his EKG.  Blood pressure was a little elevated.  He has follow-up with his vascular surgeon at the end of the month.  We will plan follow-up with him over the summer with no further testing at this time.Marland Kitchen  Pixie Casino, MD, [facility], [place] Director of the Advanced Lipid Disorders &  Cardiovascular Risk Reduction Clinic Diplomate of the American Board of Clinical Lipidology Attending Cardiologist  Direct Dial: [phone]  Fax: [phone]  Website:  [email or web address] 10/09/2019, 1:43 PM

## 2019-10-09 NOTE — Patient Instructions (Signed)
Medication Instructions:  Your physician recommends that you continue on your current medications as directed. Please refer to the Current Medication list given to you today.  *If you need a refill on your cardiac medications before your next appointment, please call your pharmacy*   Follow-Up: At [facility], you and your health needs are our priority.  As part of our continuing mission to provide you with exceptional heart care, we have created designated Provider Care Teams.  These Care Teams include your primary Cardiologist (physician) and Advanced Practice Providers (APPs -  Physician Assistants and Nurse Practitioners) who all work together to provide you with the care you need, when you need it.  We recommend signing up for the patient portal called "MyChart".  Sign up information is provided on this After Visit Summary.  MyChart is used to connect with patients for Virtual Visits (Telemedicine).  Patients are able to view lab/test results, encounter notes, upcoming appointments, etc.  Non-urgent messages can be sent to your provider as well.   To learn more about what you can do with MyChart, go to [email or web address].    Your next appointment:   3 month(s)  The format for your next appointment:   In Person  Provider:   You may see Kenneth C Hilty, MD or one of the following Advanced Practice Providers on your designated Care Team:    Hao Meng, PA-C  Angela Duke, PA-C or   Krista Kroeger, PA-C    Other Instructions   

## 2019-10-16 ENCOUNTER — Ambulatory Visit
Admission: RE | Admit: 2019-10-16 | Discharge: 2019-10-16 | Disposition: A | Discharge status: Home / Self Care | Payer: Medicare Other | Source: Ambulatory Visit | Attending: Vascular Surgery | Admitting: Vascular Surgery

## 2019-10-16 DIAGNOSIS — I712 Thoracic aortic aneurysm, without rupture, unspecified: Secondary | ICD-10-CM

## 2019-10-16 DIAGNOSIS — I998 Other disorder of circulatory system: Secondary | ICD-10-CM

## 2019-10-16 MED ORDER — IOPAMIDOL (ISOVUE-370) INJECTION 76%
75.0000 mL | Freq: Once | INTRAVENOUS | Status: AC | PRN
  Administered 2019-10-16: 75 mL via INTRAVENOUS

## 2019-10-21 ENCOUNTER — Other Ambulatory Visit: Payer: Self-pay

## 2019-10-21 ENCOUNTER — Ambulatory Visit (INDEPENDENT_AMBULATORY_CARE_PROVIDER_SITE_OTHER): Payer: Self-pay | Admitting: Vascular Surgery

## 2019-10-21 ENCOUNTER — Encounter: Payer: Self-pay | Admitting: Vascular Surgery

## 2019-10-21 VITALS — BP 139/83 | HR 74 | Temp 98.0°F | Resp 16 | Ht 68.0 in | Wt 141.0 lb

## 2019-10-21 DIAGNOSIS — I712 Thoracic aortic aneurysm, without rupture, unspecified: Secondary | ICD-10-CM

## 2019-10-21 NOTE — Progress Notes (Signed)
Patient name: Andrew [name] MRN: [id number] DOB: 17-Oct-1931 Sex: male  REASON FOR VISIT: 1 month follow-up after endovascular repair of descending thoracic aneurysm  HPI: Andrew [name] is a 86 y.o. male who presents for 1 month follow-up after endovascular repair of descending thoracic aneurysm.  He was seen in the hospital briefly after discharge when he presented with chest pain in the ED.  He underwent multiple work-up including stress test that was negative for inducible ischemia.  He just had follow-up with cardiology and states they do not feel this is cardiac in origin.  He is still working with PT at home.  He has had some persistent chest pain that sounds like it comes and goes but overall is starting to improve.  Past Medical History:  Diagnosis Date  . Angioedema 06/01/2015  . Arthritis   . Back pain   . Cancer ([place])    squamous cell skin cancer carcinomas removed from hand and face  . Chronic kidney disease    ? bph, pt self catheterizes himself on schedule  . Complication of anesthesia    had to have Foley post op cabg and lumb lam  . Coronary artery disease    s/p remote PCI // s/p CABG in 2010 Echo 01/2019: EF 60-65, Gr 1 DD, normal RVSF, RVSP 36, mild LAE, mild to mod TR  . Hypertension   . PVC's (premature ventricular contractions)    Monitor 12/2018:  Sinus rhythm with frequent PVC's (11.5%) and periods of bigeminy.   . Thoracic aortic aneurysm ([place])    09/29/16 CT: 4.3 cm ascending, 4.5 cm descending. 1 yr f/u rec // s/p endovascular repair 09/2019 (Dr. Carlis Abbott)  . Urinary retention   . Wears glasses     Past Surgical History:  Procedure Laterality Date  . BACK SURGERY  2012   lumb lam  . CARDIAC CATHETERIZATION     2010-per patient  . COLONOSCOPY    . CORONARY ARTERY BYPASS GRAFT     2010  . EPIGASTRIC HERNIA REPAIR N/A 09/11/2017   Procedure: HERNIA REPAIR EPIGASTRIC ADULT;  Surgeon: Rolm Bookbinder, MD;  Location: [facility];  Service: General;  Laterality:  N/A;  . HERNIA REPAIR     rih 1981  . HERNIA REPAIR  09/11/2017   with mesh  . INGUINAL HERNIA REPAIR  07/30/2012   Procedure: HERNIA REPAIR INGUINAL ADULT;  Surgeon: Rolm Bookbinder, MD;  Location: [place];  Service: General;  Laterality: Left;  Left Hernia Site  . INSERTION OF MESH  07/30/2012   Procedure: INSERTION OF MESH;  Surgeon: Rolm Bookbinder, MD;  Location: [facility];  Service: General;  Laterality: Left;  Left Inguinal Hernia  . INSERTION OF MESH N/A 09/11/2017   Procedure: INSERTION OF MESH;  Surgeon: Rolm Bookbinder, MD;  Location: [facility];  Service: General;  Laterality: N/A;  . LAPAROSCOPY N/A 09/11/2017   Procedure: DIAGNOSTIC LAPAOSCOPY;  Surgeon: Rolm Bookbinder, MD;  Location: [place];  Service: General;  Laterality: N/A;  ERAS pathway  . SPINE SURGERY     lumbar laminectomy  . THORACIC AORTIC ENDOVASCULAR STENT GRAFT N/A 09/22/2019   Procedure: THORACIC AORTIC ENDOVASCULAR STENT GRAFT;  Surgeon: Marty Heck, MD;  Location: [facility] OR;  Service: Vascular;  Laterality: N/A;  . TONSILLECTOMY      Family History  Problem Relation Age of Onset  . Allergic rhinitis Son   . Coronary artery disease Mother   . Angioedema Neg Hx   .  Asthma Neg Hx   . Atopy Neg Hx   . Eczema Neg Hx   . Immunodeficiency Neg Hx   . Urticaria Neg Hx     SOCIAL HISTORY: Social History   Tobacco Use  . Smoking status: Former Smoker    Packs/day: 1.00    Years: 8.00    Pack years: 8.00    Quit date: 07/24/1984    Years since quitting: 35.2  . Smokeless tobacco: Never Used  Substance Use Topics  . Alcohol use: No    Allergies  Allergen Reactions  . Quinolones Other (See Comments)    Patient has an abdominal aortic aneurysm     Current Outpatient Medications  Medication Sig Dispense Refill  . aspirin 81 MG tablet Take 81 mg by mouth daily after supper.     Marland Kitchen atorvastatin (LIPITOR) 20 MG tablet TAKE 1 TABLET ONCE DAILY AT 6 PM. (Patient taking  differently: Take 20 mg by mouth daily at 6 PM. ) 90 tablet 2  . azithromycin (ZITHROMAX) 250 MG tablet     . Cholecalciferol (VITAMIN D3) 1000 units CAPS Take 1,000 Units by mouth daily.    Marland Kitchen Co-Enzyme Q-10 100 MG CAPS Take 100 mg by mouth daily.     Marland Kitchen CRANBERRY EXTRACT PO Take 1 tablet by mouth daily.    Marland Kitchen DILT-XR 180 MG 24 hr capsule TAKE 1 CAPSULE DAILY. (Patient taking differently: Take 180 mg by mouth daily. ) 30 capsule 5  . ferrous sulfate 325 (65 FE) MG tablet Take 325 mg by mouth daily with breakfast.    . gabapentin (NEURONTIN) 300 MG capsule Take 300 mg by mouth at bedtime.     Marland Kitchen ibuprofen (ADVIL) 200 MG tablet Take 200-400 mg by mouth every 6 (six) hours as needed (for pain).    . isosorbide mononitrate (IMDUR) 30 MG 24 hr tablet Take 0.5 tablets (15 mg total) by mouth daily. 15 tablet 0  . loratadine (CLARITIN) 10 MG tablet Take 10 mg by mouth daily as needed for allergies or rhinitis.    . Multiple Vitamins-Minerals (PRESERVISION AREDS 2 PO) Take 1 capsule by mouth 2 (two) times daily.    . nitroGLYCERIN (NITRODUR - DOSED IN MG/24 HR) 0.2 mg/hr patch Use 1/2 patch daily to the affected area 30 patch 1  . nitroGLYCERIN (NITROSTAT) 0.6 MG SL tablet Place 0.6 mg under the tongue every 5 (five) minutes as needed for chest pain.    Marland Kitchen oxymetazoline (AFRIN) 0.05 % nasal spray Place 1 spray into both nostrils at bedtime as needed for congestion.    . sodium chloride (MURO 128) 5 % ophthalmic ointment Place 1 application into both eyes at bedtime.    . triamcinolone (NASACORT ALLERGY 24HR) 55 MCG/ACT AERO nasal inhaler Place 2 sprays into the nose daily as needed (for allergies).     . TURMERIC PO Take 1 capsule by mouth daily after breakfast.    . vitamin B-12 (CYANOCOBALAMIN) 1000 MCG tablet Take 1,000 mcg by mouth daily.     . vitamin C (ASCORBIC ACID) 250 MG tablet Take 250 mg by mouth daily.     Marland Kitchen oxyCODONE-acetaminophen (PERCOCET) 5-325 MG tablet Take 1 tablet by mouth every 6 (six)  hours as needed for severe pain. (Patient not taking: Reported on 10/21/2019) 15 tablet 0   No current facility-administered medications for this visit.    REVIEW OF SYSTEMS:  [X]  denotes positive finding, [ ]  denotes negative finding Cardiac  Comments:  Chest pain or chest  pressure:    Shortness of breath upon exertion:    Short of breath when lying flat:    Irregular heart rhythm:        Vascular    Pain in calf, thigh, or hip brought on by ambulation:    Pain in feet at night that wakes you up from your sleep:     Blood clot in your veins:    Leg swelling:         Pulmonary    Oxygen at home:    Productive cough:     Wheezing:         Neurologic    Sudden weakness in arms or legs:     Sudden numbness in arms or legs:     Sudden onset of difficulty speaking or slurred speech:    Temporary loss of vision in one eye:     Problems with dizziness:         Gastrointestinal    Blood in stool:     Vomited blood:         Genitourinary    Burning when urinating:     Blood in urine:        Psychiatric    Major depression:         Hematologic    Bleeding problems:    Problems with blood clotting too easily:        Skin    Rashes or ulcers:        Constitutional    Fever or chills:      PHYSICAL EXAM: Vitals:   10/21/19 1618  BP: 139/83  Pulse: 74  Resp: 16  Temp: 98 F (36.7 C)  TempSrc: Temporal  Weight: 141 lb (64 kg)  Height: 5\' 8"  (1.727 m)    GENERAL: The patient is a well-nourished male, in no acute distress. The vital signs are documented above. CARDIAC: There is a regular rate and rhythm.  VASCULAR:  Palpable femoral pulses both groins Palpable DP pulses bilaterally  DATA:   I independently reviewed his CTA chest and the stent graft remains well-positioned with no overt endoleak and good exclusion of the aneurysm.  Assessment/Plan:  86 year old male status post endovascular repair of descending thoracic aneurysm with stent graft.  I reviewed  his CTA at 1 month postop and again the stent graft looks well positioned with no endoleak and aneurysm looks appropriately excluded.  Has has had some persistent chest pain since surgery initially felt this was likely cardiac in origin but has had a negative stress test and has followed up with cardiology.  Discussed with him and his wife that certainly this may be related to stent graft repair and I would be hopeful that this continues to improve with time.  Again stent looks good overall.  I do not see any overt issues or other concerns on the CT today from stent repair.  I will have him follow-up in 9 months with CTA chest abdomen and pelvis and we can image his abdominal aorta at that time for ongoing surveillance as well since he has known AAA.   Marty Heck, MD [facility] Office: [phone]

## 2019-11-25 ENCOUNTER — Telehealth: Payer: Self-pay

## 2019-11-25 NOTE — Telephone Encounter (Signed)
Pt called to say that he was having weakness in his legs and unable to get good balance. He said that this has been going on off and on since his procedure. Thought maybe it was normal but really feels he needs to be seen.   Appt being made for follow up.   Felice Deem Mindi Junker

## 2019-12-01 ENCOUNTER — Other Ambulatory Visit: Payer: Self-pay | Admitting: Internal Medicine

## 2019-12-02 ENCOUNTER — Telehealth (HOSPITAL_COMMUNITY): Payer: Self-pay

## 2019-12-02 NOTE — Telephone Encounter (Signed)

## 2019-12-03 ENCOUNTER — Ambulatory Visit (HOSPITAL_COMMUNITY)
Admission: RE | Admit: 2019-12-03 | Discharge: 2019-12-03 | Disposition: A | Discharge status: Home / Self Care | Payer: Medicare Other | Source: Ambulatory Visit | Attending: Vascular Surgery | Admitting: Vascular Surgery

## 2019-12-03 ENCOUNTER — Other Ambulatory Visit: Payer: Self-pay | Admitting: *Deleted

## 2019-12-03 ENCOUNTER — Other Ambulatory Visit: Payer: Self-pay

## 2019-12-03 ENCOUNTER — Ambulatory Visit (INDEPENDENT_AMBULATORY_CARE_PROVIDER_SITE_OTHER): Payer: Medicare Other | Admitting: Physician Assistant

## 2019-12-03 ENCOUNTER — Encounter (HOSPITAL_COMMUNITY): Payer: Self-pay

## 2019-12-03 VITALS — BP 137/83 | HR 83 | Temp 98.0°F | Resp 20 | Ht 68.0 in | Wt 141.0 lb

## 2019-12-03 DIAGNOSIS — I714 Abdominal aortic aneurysm, without rupture, unspecified: Secondary | ICD-10-CM

## 2019-12-03 DIAGNOSIS — I712 Thoracic aortic aneurysm, without rupture, unspecified: Secondary | ICD-10-CM

## 2019-12-03 NOTE — Progress Notes (Unsigned)
Mr. Koltes did not want an abdominal aortic duplex at this time. He explained that the abdominal aneurysm was stable during his last procedure September 22 2019 and he did not feel a repeat study was necessary. His primary concern was complications from his thoracic aortic stent graft.   It was explained that his aortic duplex would be canceled and rescheduled in the future if the provider felt like it was necessary.

## 2019-12-03 NOTE — Progress Notes (Signed)
[facility] [place] HISTORY AND PHYSICAL   History of Present Illness:  Patient is a 86 y.o. year old male who presents for evaluation of abdominal aortic aneurysm s/p TEVAR 09/22/2019.  He was in the hospital for 3 days post op.  On 09/26/19 he reported to the ED with complaints of CP.  CTA of chest showed no endoleak and good graft coverage.  Cardiac work up with negative cardiac enzymes, negative new findings on Echo and No significant changes in ECG with lexiscan.  He states his symptoms have been LE weakness and it is progressive.  He has difficulty standing and low endurance with ambulation.  He has to lift his legs to get in and out of the bed and car.  He completed PT post op and is maintaining exercises at home and goes to the gym.  He is very frustrated with his progress after 2 months.      Past Medical History:  Diagnosis Date  . AAA (abdominal aortic aneurysm) ([place])   . Angioedema 06/01/2015  . Arthritis   . Back pain   . 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 Chronic kidney disease    ? bph, pt self catheterizes himself on schedule  . Complication of anesthesia    had to have Foley post op cabg and lumb lam  . Coronary artery disease    s/p remote PCI // s/p CABG in 2010 Echo 01/2019: EF 60-65, Gr 1 DD, normal RVSF, RVSP 36, mild LAE, mild to mod TR  . Hypertension   . PVC's (premature ventricular contractions)    Monitor 12/2018:  Sinus rhythm with frequent PVC's (11.5%) and periods of bigeminy.   . Thoracic aortic aneurysm ([place])    09/29/16 CT: 4.3 cm ascending, 4.5 cm descending. 1 yr f/u rec // s/p endovascular repair 09/2019 (Dr. Carlis Abbott)  . Urinary retention   . Wears glasses     Past Surgical History:  Procedure Laterality Date  . BACK SURGERY  2012   lumb lam  . CARDIAC CATHETERIZATION     2010-per patient  . COLONOSCOPY    . CORONARY ARTERY BYPASS GRAFT     2010  . EPIGASTRIC HERNIA REPAIR N/A  09/11/2017   Procedure: HERNIA REPAIR EPIGASTRIC ADULT;  Surgeon: Rolm Bookbinder, MD;  Location: [facility];  Service: General;  Laterality: N/A;  . HERNIA REPAIR     rih 1981  . HERNIA REPAIR  09/11/2017   with mesh  . INGUINAL HERNIA REPAIR  07/30/2012   Procedure: HERNIA REPAIR INGUINAL ADULT;  Surgeon: Rolm Bookbinder, MD;  Location: [facility];  Service: General;  Laterality: Left;  Left Hernia Site  . INSERTION OF MESH  07/30/2012   Procedure: INSERTION OF MESH;  Surgeon: Rolm Bookbinder, MD;  Location: [facility] [place];  Service: General;  Laterality: Left;  Left Inguinal Hernia  . INSERTION OF MESH N/A 09/11/2017   Procedure: INSERTION OF MESH;  Surgeon: Rolm Bookbinder, MD;  Location: [facility] [place];  Service: General;  Laterality: N/A;  . LAPAROSCOPY N/A 09/11/2017   Procedure: DIAGNOSTIC LAPAOSCOPY;  Surgeon: Rolm Bookbinder, MD;  Location: [facility];  Service: General;  Laterality: N/A;  ERAS pathway  . SPINE SURGERY     lumbar laminectomy  . THORACIC AORTIC ENDOVASCULAR STENT GRAFT N/A 09/22/2019   Procedure: THORACIC AORTIC ENDOVASCULAR STENT GRAFT;  Surgeon: Marty Heck, MD;  Location: [facility];  Service: Vascular;  Laterality: N/A;  . TONSILLECTOMY       Social History Social History   Tobacco Use  . Smoking status: Former Smoker    Packs/day: 1.00    Years: 8.00    Pack years: 8.00    Quit date: 07/24/1984    Years since quitting: 35.3  . Smokeless tobacco: Never Used  Substance Use Topics  . Alcohol use: No  . Drug use: No    Family History Family History  Problem Relation Age of Onset  . Allergic rhinitis Son   . Coronary artery disease Mother   . Angioedema Neg Hx   . Asthma Neg Hx   . Atopy Neg Hx   . Eczema Neg Hx   . Immunodeficiency Neg Hx   . Urticaria Neg Hx     Allergies  Allergies  Allergen Reactions  . Quinolones Other (See Comments)    Patient has an abdominal aortic aneurysm      Current Outpatient Medications   Medication Sig Dispense Refill  . aspirin 81 MG tablet Take 81 mg by mouth daily after supper.     Marland Kitchen atorvastatin (LIPITOR) 20 MG tablet TAKE 1 TABLET ONCE DAILY AT 6 PM. 90 tablet 0  . Cholecalciferol (VITAMIN D3) 1000 units CAPS Take 1,000 Units by mouth daily.    Marland Kitchen Co-Enzyme Q-10 100 MG CAPS Take 100 mg by mouth daily.     Marland Kitchen CRANBERRY EXTRACT PO Take 1 tablet by mouth daily.    Marland Kitchen DILT-XR 180 MG 24 hr capsule TAKE 1 CAPSULE DAILY. (Patient taking differently: Take 180 mg by mouth daily. ) 30 capsule 5  . ferrous sulfate 325 (65 FE) MG tablet Take 325 mg by mouth daily with breakfast.    . gabapentin (NEURONTIN) 300 MG capsule Take 300 mg by mouth at bedtime.     Marland Kitchen ibuprofen (ADVIL) 200 MG tablet Take 200-400 mg by mouth every 6 (six) hours as needed (for pain).    Marland Kitchen loratadine (CLARITIN) 10 MG tablet Take 10 mg by mouth daily as needed for allergies or rhinitis.    . Multiple Vitamins-Minerals (PRESERVISION AREDS 2 PO) Take 1 capsule by mouth 2 (two) times daily.    . nitroGLYCERIN (NITRODUR - DOSED IN MG/24 HR) 0.2 mg/hr patch Use 1/2 patch daily to the affected area 30 patch 1  . nitroGLYCERIN (NITROSTAT) 0.6 MG SL tablet Place 0.6 mg under the tongue every 5 (five) minutes as needed for chest pain.    Marland Kitchen oxymetazoline (AFRIN) 0.05 % nasal spray Place 1 spray into both nostrils at bedtime as needed for congestion.    . sodium chloride (MURO 128) 5 % ophthalmic ointment Place 1 application into both eyes at bedtime.    . triamcinolone (NASACORT ALLERGY 24HR) 55 MCG/ACT AERO nasal inhaler Place 2 sprays into the nose daily as needed (for allergies).     . TURMERIC PO Take 1 capsule by mouth daily after breakfast.    . vitamin B-12 (CYANOCOBALAMIN) 1000 MCG tablet Take 1,000 mcg by mouth daily.     . vitamin C (ASCORBIC ACID) 250 MG tablet Take 250 mg by mouth daily.     Marland Kitchen azithromycin (ZITHROMAX) 250 MG tablet     . isosorbide mononitrate (IMDUR) 30 MG 24 hr tablet Take 0.5 tablets (15 mg  total) by mouth daily. 15 tablet 0  . oxyCODONE-acetaminophen (PERCOCET) 5-325 MG tablet Take 1 tablet by mouth every 6 (six) hours as needed for severe pain. (Patient not taking: Reported on  10/21/2019) 15 tablet 0   No current facility-administered medications for this visit.    ROS:   General:  No weight loss, Fever, chills  HEENT: No recent headaches, no nasal bleeding, no visual changes, no sore throat  Neurologic: No dizziness, blackouts, seizures. No recent symptoms of stroke or mini- stroke. No recent episodes of slurred speech, or temporary blindness.  Cardiac: No recent episodes of chest pain/pressure, no shortness of breath at rest.  No shortness of breath with exertion.  Denies history of atrial fibrillation or irregular heartbeat  Vascular: No history of rest pain in feet.  No history of claudication.  No history of non-healing ulcer, No history of DVT [x]  venous insufficiency   Pulmonary: No home oxygen, no productive cough, no hemoptysis,  No asthma or wheezing  Musculoskeletal:  [ ]  Arthritis, [ ]  Low back pain,  [ ]  Joint pain  Hematologic:No history of hypercoagulable state.  No history of easy bleeding.  No history of anemia  Gastrointestinal: No hematochezia or melena,  No gastroesophageal reflux, no trouble swallowing  Urinary: [ ]  chronic Kidney disease, [ ]  on HD - [ ]  MWF or [ ]  TTHS, [ ]  Burning with urination, [ ]  Frequent urination, [ ]  Difficulty urinating;   Skin: No rashes  Psychological: No history of anxiety,  No history of depression   Physical Examination  Vitals:   12/03/19 0839  BP: 137/83  Pulse: 83  Resp: 20  Temp: 98 F (36.7 C)  SpO2: 96%  Weight: 141 lb (64 kg)  Height: 5\' 8"  (1.727 m)    Body mass index is 21.44 kg/m.  General:  Alert and oriented, no acute distress HEENT: Normal Neck: No bruit or JVD Pulmonary: Clear to auscultation bilaterally Cardiac: Regular Rate and Rhythm without murmur Gastrointestinal: Soft,  non-tender, non-distended, no mass, no scars Skin: No rash Extremity Pulses:  2+ radial, brachial, femoral, dorsalis pedis, posterior tibial pulses bilaterally Musculoskeletal: No deformity or edema  Neurologic: Upper 5/5 grip and biceps and lower extremity motor 4/5 and symmetric Sensation intact to lite touch  DATA:  CTA 09/26/2019 IMPRESSION: 1. No evidence of pulmonary embolus. 2. Mild to moderate severity atelectasis and/or infiltrate along the posterior and lateral aspects of the right lung base. 3. Small right pleural effusion. 4. Stable, approximately 4.9 cm x 4.6 cm infrarenal abdominal aortic aneurysm. 5. Stable marked severity narrowing of the origin of the celiac artery, superior mesenteric artery and bilateral renal arteries with occlusion of the IMA and distal reconstitution of flow. 6. Prior stenting of the thoracic aorta without evidence of stent occlusion or stent leak. 7. Marked severity coronary artery calcification.  ASSESSMENT:  09/22/2019 Procedure: 1.  Ultrasound-guided access with percutanenous closure of the right common femoral artery for percutaneous delivery of thoracic endograft 2.  Thoracic arch aortogram 3.  Endovascular repair of descending thoracic aortic aneurysm without coverage of the left subclavian artery (40 mm x 20 cm Gore TAG x2)   Good arterial flow to all 4 extremities with palpable pulses.  Know B LE venous insufficiency wearing compression socks and elevation when at rest. Progressive weakness in bilateral LE  PLAN: We will send for a neurologic consult to r/o neurologic deficits and spinal cord ischemia.  He will continue to exercise daily as tolerates and a healthy diet.  He will f/u with DR. Donzetta Matters in 1 month with AAA ultrasound and to review his symptoms.  DR. Fields did talk with the patient and his wife to review his  symptoms.   Roxy Horseman PA-C [facility] [place] Office: [phone]  MD in  clinic Fields

## 2019-12-08 ENCOUNTER — Telehealth: Payer: Self-pay | Admitting: Diagnostic Neuroimaging

## 2019-12-08 ENCOUNTER — Encounter: Payer: Self-pay | Admitting: Diagnostic Neuroimaging

## 2019-12-08 ENCOUNTER — Other Ambulatory Visit: Payer: Self-pay | Admitting: *Deleted

## 2019-12-08 ENCOUNTER — Ambulatory Visit: Payer: Medicare Other | Admitting: Diagnostic Neuroimaging

## 2019-12-08 ENCOUNTER — Other Ambulatory Visit: Payer: Self-pay

## 2019-12-08 VITALS — BP 136/72 | HR 68 | Temp 96.8°F | Ht 67.0 in | Wt 147.6 lb

## 2019-12-08 DIAGNOSIS — R29898 Other symptoms and signs involving the musculoskeletal system: Secondary | ICD-10-CM

## 2019-12-08 DIAGNOSIS — I714 Abdominal aortic aneurysm, without rupture, unspecified: Secondary | ICD-10-CM

## 2019-12-08 NOTE — Progress Notes (Signed)
[facility]  PATIENT: Andrew [name] DOB: 07/21/1932  REFERRING CLINICIAN: [name], MD HISTORY FROM: patient  REASON FOR VISIT: new consult    HISTORICAL  CHIEF COMPLAINT:  Chief Complaint  Patient presents with  . BLE Weakness, progressive    rm 7, New Pt, wife- [name] "2 thoracic aortic stents placed 3/1, completdd 5 weeks of PT, going to gym 3 days/week, persistant leg weakness ever since"    HISTORY OF PRESENT ILLNESS:   86 year old male here for evaluation of lower extremity weakness.  Patient underwent endovascular repair of descending thoracic aortic aneurysm on 09/22/2019.  Patient was discharged a few days later.  Patient underwent physical therapy and recovery.  Initially he thought his recovery was taking normal course but after 1 month he expressed frustration at not being able to return to his preoperative baseline.  Since that time he feels like he is getting worse.  He is having more weakness in lower extremities.  Is continuing to do exercises at the gym but not noticing any benefit.  He does not notice any numbness in his feet or legs.  No problems with his arms.  He still using a walker.   REVIEW OF SYSTEMS: Full 14 system review of systems performed and negative with exception of: As per HPI.  ALLERGIES: Allergies  Allergen Reactions  . Quinolones Other (See Comments)    Patient has an abdominal aortic aneurysm     HOME MEDICATIONS: Outpatient Medications Prior to Visit  Medication Sig Dispense Refill  . aspirin 81 MG tablet Take 81 mg by mouth daily after supper.     Marland Kitchen atorvastatin (LIPITOR) 20 MG tablet TAKE 1 TABLET ONCE DAILY AT 6 PM. 90 tablet 0  . Cholecalciferol (VITAMIN D3) 1000 units CAPS Take 1,000 Units by mouth daily.    Marland Kitchen Co-Enzyme Q-10 100 MG CAPS Take 100 mg by mouth daily.     Marland Kitchen CRANBERRY EXTRACT PO Take 1 tablet by mouth daily.    Marland Kitchen DILT-XR 180 MG 24 hr capsule TAKE 1 CAPSULE DAILY. (Patient taking  differently: Take 180 mg by mouth daily. ) 30 capsule 5  . ferrous sulfate 325 (65 FE) MG tablet Take 325 mg by mouth daily with breakfast.    . furosemide (LASIX) 20 MG tablet Take 20 mg by mouth daily.    Marland Kitchen gabapentin (NEURONTIN) 300 MG capsule Take 300 mg by mouth at bedtime.     Marland Kitchen ibuprofen (ADVIL) 200 MG tablet Take 200-400 mg by mouth every 6 (six) hours as needed (for pain).    Marland Kitchen loratadine (CLARITIN) 10 MG tablet Take 10 mg by mouth daily as needed for allergies or rhinitis.    . Multiple Vitamins-Minerals (PRESERVISION AREDS 2 PO) Take 1 capsule by mouth 2 (two) times daily.    . nitroGLYCERIN (NITRODUR - DOSED IN MG/24 HR) 0.2 mg/hr patch Use 1/2 patch daily to the affected area 30 patch 1  . nitroGLYCERIN (NITROSTAT) 0.6 MG SL tablet Place 0.6 mg under the tongue every 5 (five) minutes as needed for chest pain.    Marland Kitchen oxyCODONE-acetaminophen (PERCOCET) 5-325 MG tablet Take 1 tablet by mouth every 6 (six) hours as needed for severe pain. 15 tablet 0  . oxymetazoline (AFRIN) 0.05 % nasal spray Place 1 spray into both nostrils at bedtime as needed for congestion.    . pantoprazole (PROTONIX) 40 MG tablet Take 40 mg by mouth daily.    . sodium chloride (MURO 128) 5 % ophthalmic  ointment Place 1 application into both eyes at bedtime.    . triamcinolone (NASACORT ALLERGY 24HR) 55 MCG/ACT AERO nasal inhaler Place 2 sprays into the nose daily as needed (for allergies).     . TURMERIC PO Take 1 capsule by mouth daily after breakfast.    . vitamin B-12 (CYANOCOBALAMIN) 1000 MCG tablet Take 1,000 mcg by mouth daily.     . vitamin C (ASCORBIC ACID) 250 MG tablet Take 250 mg by mouth daily.     Marland Kitchen azithromycin (ZITHROMAX) 250 MG tablet     . isosorbide mononitrate (IMDUR) 30 MG 24 hr tablet Take 0.5 tablets (15 mg total) by mouth daily. 15 tablet 0   No facility-administered medications prior to visit.    PAST MEDICAL HISTORY: Past Medical History:  Diagnosis Date  . AAA (abdominal aortic  aneurysm) ([place])   . Angioedema 06/01/2015  . Arthritis   . Back pain   . 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 Chronic kidney disease    ? bph, pt self catheterizes himself on schedule  . Complication of anesthesia    had to have Foley post op cabg and lumb lam  . Coronary artery disease    s/p remote PCI // s/p CABG in 2010 Echo 01/2019: EF 60-65, Gr 1 DD, normal RVSF, RVSP 36, mild LAE, mild to mod TR  . Hypertension   . PVC's (premature ventricular contractions)    Monitor 12/2018:  Sinus rhythm with frequent PVC's (11.5%) and periods of bigeminy.   . Thoracic aortic aneurysm ([place])    09/29/16 CT: 4.3 cm ascending, 4.5 cm descending. 1 yr f/u rec // s/p endovascular repair 09/2019 (Dr. Carlis Abbott)  . Urinary retention   . Wears glasses     PAST SURGICAL HISTORY: Past Surgical History:  Procedure Laterality Date  . BACK SURGERY  2012   lumb lam  . CARDIAC CATHETERIZATION     2010-per patient  . COLONOSCOPY    . CORONARY ARTERY BYPASS GRAFT  2010   triple  . EPIGASTRIC HERNIA REPAIR N/A 09/11/2017   Procedure: HERNIA REPAIR EPIGASTRIC ADULT;  Surgeon: Rolm Bookbinder, MD;  Location: [facility];  Service: General;  Laterality: N/A;  . HERNIA REPAIR     rih 1981  . HERNIA REPAIR  09/11/2017   with mesh  . INGUINAL HERNIA REPAIR  07/30/2012   Procedure: HERNIA REPAIR INGUINAL ADULT;  Surgeon: Rolm Bookbinder, MD;  Location: [facility];  Service: General;  Laterality: Left;  Left Hernia Site  . INSERTION OF MESH  07/30/2012   Procedure: INSERTION OF MESH;  Surgeon: Rolm Bookbinder, MD;  Location: [facility];  Service: General;  Laterality: Left;  Left Inguinal Hernia  . INSERTION OF MESH N/A 09/11/2017   Procedure: INSERTION OF MESH;  Surgeon: Rolm Bookbinder, MD;  Location: [facility];  Service: General;  Laterality: N/A;  . LAPAROSCOPY N/A 09/11/2017   Procedure: DIAGNOSTIC LAPAOSCOPY;  Surgeon: Rolm Bookbinder,  MD;  Location: [place];  Service: General;  Laterality: N/A;  ERAS pathway  . SPINE SURGERY     lumbar laminectomy  . THORACIC AORTIC ENDOVASCULAR STENT GRAFT N/A 09/22/2019   Procedure: THORACIC AORTIC ENDOVASCULAR STENT GRAFT;  Surgeon: Marty Heck, MD;  Location: [facility];  Service: Vascular;  Laterality: N/A;  . TONSILLECTOMY      FAMILY HISTORY: Family History  Problem Relation Age of Onset  . Allergic rhinitis Son   .  Coronary artery disease Mother   . Angioedema Neg Hx   . Asthma Neg Hx   . Atopy Neg Hx   . Eczema Neg Hx   . Immunodeficiency Neg Hx   . Urticaria Neg Hx     SOCIAL HISTORY: Social History   Socioeconomic History  . Marital status: Married    Spouse name: [name]  . Number of children: Not on file  . Years of education: Not on file  . Highest education level: Not on file  Occupational History    Comment: retired  Tobacco Use  . Smoking status: Former Smoker    Packs/day: 1.00    Years: 8.00    Pack years: 8.00    Quit date: 07/24/1984    Years since quitting: 35.3  . Smokeless tobacco: Never Used  Substance and Sexual Activity  . Alcohol use: No  . Drug use: No  . Sexual activity: Not on file  Other Topics Concern  . Not on file  Social History Narrative   12/08/19 lives with wife   Social Determinants of Health   Financial Resource Strain:   . Difficulty of Paying Living Expenses:   Food Insecurity:   . Worried About Charity fundraiser in the Last Year:   . Arboriculturist in the Last Year:   Transportation Needs:   . Film/video editor (Medical):   Marland Kitchen Lack of Transportation (Non-Medical):   Physical Activity:   . Days of Exercise per Week:   . Minutes of Exercise per Session:   Stress:   . Feeling of Stress :   Social Connections:   . Frequency of Communication with Friends and Family:   . Frequency of Social Gatherings with Friends and Family:   . Attends Religious Services:   . Active Member of Clubs or Organizations:   .  Attends Archivist Meetings:   Marland Kitchen Marital Status:   Intimate Partner Violence:   . Fear of Current or Ex-Partner:   . Emotionally Abused:   Marland Kitchen Physically Abused:   . Sexually Abused:      PHYSICAL EXAM  GENERAL EXAM/CONSTITUTIONAL: Vitals:  Vitals:   12/08/19 1044  BP: 136/72  Pulse: 68  Temp: (!) 96.8 F (36 C)  Weight: 147 lb 9.6 oz (67 kg)  Height: 5\' 7"  (1.702 m)     Body mass index is 23.12 kg/m. Wt Readings from Last 3 Encounters:  12/08/19 147 lb 9.6 oz (67 kg)  12/03/19 141 lb (64 kg)  10/21/19 141 lb (64 kg)     Patient is in no distress; well developed, nourished and groomed; neck is supple  CARDIOVASCULAR:  Examination of carotid arteries is normal; no carotid bruits  Regular rate and rhythm, no murmurs  Examination of peripheral vascular system by observation and palpation is normal  EYES:  Ophthalmoscopic exam of optic discs and posterior segments is normal; no papilledema or hemorrhages  No exam data present  MUSCULOSKELETAL:  Gait, strength, tone, movements noted in Neurologic exam below  NEUROLOGIC: MENTAL STATUS:  No flowsheet data found.  awake, alert, oriented to person, place and time  recent and remote memory intact  normal attention and concentration  language fluent, comprehension intact, naming intact  fund of knowledge appropriate  CRANIAL NERVE:   2nd - no papilledema on fundoscopic exam  2nd, 3rd, 4th, 6th - pupils equal and reactive to light, visual fields full to confrontation, extraocular muscles intact, no nystagmus  5th - facial sensation symmetric  7th - facial strength symmetric  8th - hearing intact  9th - palate elevates symmetrically, uvula midline  11th - shoulder shrug symmetric  12th - tongue protrusion midline  MOTOR:   normal bulk and tone, full strength in the BUE, BLE; EXCEPT BILATERAL DF 4  SENSORY:   normal and symmetric to light touch, temperature, vibration; DECR VIB AT  FEEL / ANKLES  COORDINATION:   finger-nose-finger, fine finger movements normal  REFLEXES:   deep tendon reflexes --> 2 IN BUE; TRACE AT KNEES; ABSENT AT ANKLES  GAIT/STATION:   STOOPED POSTURE; SLOW TO RISE     DIAGNOSTIC DATA (LABS, IMAGING, TESTING) - I reviewed patient records, labs, notes, testing and imaging myself where available.  Lab Results  Component Value Date   WBC 8.7 09/27/2019   HGB 11.7 (L) 09/27/2019   HCT 36.3 (L) 09/27/2019   MCV 92.1 09/27/2019   PLT 150 09/27/2019      Component Value Date/Time   NA 140 09/27/2019 0539   NA 139 01/16/2019 0838   K 3.8 09/27/2019 0539   CL 102 09/27/2019 0539   CO2 28 09/27/2019 0539   GLUCOSE 99 09/27/2019 0539   BUN 17 09/27/2019 0539   BUN 19 01/16/2019 0838   CREATININE 1.01 09/27/2019 0539   CREATININE 1.32 (H) 08/19/2019 1358   CALCIUM 8.8 (L) 09/27/2019 0539   PROT 7.1 09/17/2019 1321   ALBUMIN 3.7 09/17/2019 1321   AST 20 09/17/2019 1321   ALT 11 09/17/2019 1321   ALKPHOS 111 09/17/2019 1321   BILITOT 0.7 09/17/2019 1321   GFRNONAA >60 09/27/2019 0539   GFRAA >60 09/27/2019 0539   No results found for: CHOL, HDL, LDLCALC, LDLDIRECT, TRIG, CHOLHDL Lab Results  Component Value Date   HGBA1C  06/09/2009    5.7 (NOTE) The ADA recommends the following therapeutic goal for glycemic control related to Hgb A1c measurement: Goal of therapy: <6.5 Hgb A1c  Reference: American Diabetes Association: Clinical Practice Recommendations 2010, Diabetes Care, 2010, 33: (Suppl  1).   No results found for: VITAMINB12 No results found for: TSH   06/28/13 MRI lumbar spine [I reviewed images myself and agree with interpretation. -VRP]  Abdominal aortic aneurysm incompletely assessed on present examination. CT/ultrasound recommended for further delineation.  Bladder is enlarged which may be related to bladder outlet obstruction.  Progressive degenerative changes L1-2, L4-5 and L5-S1 with most significant  change occurring at the L1-2 level. Summary of pertinent findings include:  L1-2 bulge and spur with superimposed moderate size focal central slightly caudally extending disc protrusion compressing the thecal sac. This has increased in size since prior exam.  L2-3 bulge with greater extension right foraminal position without compression of the exiting right L2 nerve root. Mild spinal stenosis slightly greater on the right.  L3-4 prior left hemilaminectomy. Bulge with facet joint degenerative changes causes mild bilateral foraminal narrowing without compression of the exiting L3 nerve roots. Left facet joint degenerative changes causes minimal impression upon the left lateral aspect of the thecal sac.  L4-5 prior left hemilaminectomy. Prominent facet joint degenerative changes with 2.4 mm anterior slippage of L4. Bulge/protrusion with extension into the neural foramen with slight encroachment upon the exiting L4 nerve roots more notable left lateral position. Right facet joint degenerative changes and ligamentum flavum hypertrophy cause mild flattening of the right lateral aspect of the thecal sac and narrowing of the superior right lateral recess.  L5-S1 prominent facet joint degenerative changes. 5 mm synovial cyst extends into the posterior  aspect of the left neural foramen immediately posterior to the exiting left L5 nerve root which does not appear significantly compressed. Minimal anterior slippage of L5. Bulge/shallow central protrusion. Minimal impression upon the ventral aspect of the thecal sac. Ligamentum flavum hypertrophy greater on left.    ASSESSMENT AND PLAN  86 y.o. year old male here with lower extremity weakness worsening following endovascular repair of descending thoracic aortic aneurysm on 09/22/2019.  Could represent prolonged recovery due to age and other medical conditions.  Will check MRI of thoracic spine and labs to rule out other secondary  cause.  Dx:  1. Weakness of both lower extremities     PLAN:  BILATERAL LOWER EXT WEAKNESS (post-operative) - check MRI thoracic spine  (eval for spinal cord infarction) - check labs  Orders Placed This Encounter  Procedures  . MR THORACIC SPINE W WO CONTRAST  . CK  . Aldolase  . AChR Abs with Reflex to MuSK  . Hemoglobin A1c  . Vitamin B12   Return for pending if symptoms worsen or fail to improve.    Penni Bombard, MD [id number], 0000000 AM Certified in Neurology, Neurophysiology and Neuroimaging  [facility] [address] [place], [place] [address] [phone]

## 2019-12-08 NOTE — Telephone Encounter (Signed)
[employer] medicare order sent to GI. No auth they will reach out to the patient to schedule.  

## 2019-12-11 ENCOUNTER — Ambulatory Visit
Admission: RE | Admit: 2019-12-11 | Discharge: 2019-12-11 | Disposition: A | Discharge status: Home / Self Care | Payer: Medicare Other | Source: Ambulatory Visit | Attending: Diagnostic Neuroimaging | Admitting: Diagnostic Neuroimaging

## 2019-12-11 DIAGNOSIS — R29898 Other symptoms and signs involving the musculoskeletal system: Secondary | ICD-10-CM

## 2019-12-11 MED ORDER — GADOBENATE DIMEGLUMINE 529 MG/ML IV SOLN
13.0000 mL | Freq: Once | INTRAVENOUS | Status: AC | PRN
  Administered 2019-12-11: 13 mL via INTRAVENOUS

## 2019-12-16 ENCOUNTER — Telehealth: Payer: Self-pay | Admitting: *Deleted

## 2019-12-16 NOTE — Telephone Encounter (Signed)
LVM informing patient his MRI thoracic spine showed unremarkable imaging results.  Advised he also has unremarkable labs with one lab pending. Left # for questions.

## 2019-12-18 ENCOUNTER — Encounter: Payer: Self-pay | Admitting: *Deleted

## 2019-12-18 LAB — ACHR ABS WITH REFLEX TO MUSK: AChR Binding Ab, Serum: <0.03 nmol/L (ref 0.00–0.24)

## 2019-12-18 LAB — HEMOGLOBIN A1C
Est. average glucose Bld gHb Est-mCnc: 117 mg/dL
Hgb A1c MFr Bld: 5.7 % — ABNORMAL HIGH (ref 4.8–5.6)

## 2019-12-18 LAB — CK: Total CK: 80 U/L (ref 30–208)

## 2019-12-18 LAB — ALDOLASE: Aldolase: 5.4 U/L (ref 3.3–10.3)

## 2019-12-18 LAB — MUSK ANTIBODIES: MuSK Antibodies: <1 U/mL

## 2019-12-18 LAB — VITAMIN B12: Vitamin B-12: 1878 pg/mL — ABNORMAL HIGH (ref 232–1245)

## 2019-12-18 NOTE — Telephone Encounter (Signed)
Sent my chart: unremarkable labs, monitor symptoms, call as needed.

## 2019-12-18 NOTE — Telephone Encounter (Signed)
Agree. Monitor for now. -VRP

## 2019-12-24 NOTE — Telephone Encounter (Addendum)
Called patient who stated he's not gotten better in past 3 months. I reviewed his MRI and lab results, advised Dr Leta Baptist ruled out other possible causes of his leg weakness. He recommends patient continue with exercising and hopefully he will regain strength in his legs. I advised he call for any worsening symptoms. Patient verbalized understanding, appreciation.

## 2019-12-24 NOTE — Telephone Encounter (Signed)
Pt has called, the message from Rogue Jury was read to him from 05-27 @ 4:18.  Pt states he has been doing what was suggested for about 3 months and he has not noticed much improvement.  Pt is asking for a call to discuss.

## 2019-12-26 ENCOUNTER — Other Ambulatory Visit: Payer: Medicare Other

## 2020-01-06 ENCOUNTER — Ambulatory Visit (INDEPENDENT_AMBULATORY_CARE_PROVIDER_SITE_OTHER): Payer: Medicare Other | Admitting: Vascular Surgery

## 2020-01-06 ENCOUNTER — Encounter: Payer: Self-pay | Admitting: Vascular Surgery

## 2020-01-06 ENCOUNTER — Other Ambulatory Visit: Payer: Self-pay

## 2020-01-06 ENCOUNTER — Ambulatory Visit (HOSPITAL_COMMUNITY)
Admission: RE | Admit: 2020-01-06 | Discharge: 2020-01-06 | Disposition: A | Discharge status: Home / Self Care | Payer: Medicare Other | Source: Ambulatory Visit | Attending: Vascular Surgery | Admitting: Vascular Surgery

## 2020-01-06 VITALS — BP 158/82 | HR 64 | Temp 97.3°F | Resp 16 | Ht 67.0 in | Wt 144.0 lb

## 2020-01-06 DIAGNOSIS — I714 Abdominal aortic aneurysm, without rupture, unspecified: Secondary | ICD-10-CM

## 2020-01-06 DIAGNOSIS — I712 Thoracic aortic aneurysm, without rupture, unspecified: Secondary | ICD-10-CM

## 2020-01-06 NOTE — Progress Notes (Signed)
Patient name: Andrew [name] MRN: [id number] DOB: 01/21/1932 Sex: male  REASON FOR VISIT: Ongoing follow-up after endovascular repair of descending thoracic aneurysm, surveillance of AAA  HPI: Andrew [name] is a 86 y.o. male who presents for ongoing follow-up after endovascular repair of descending thoracic aneurysm.  Ultimately he was seen by our PA 1 month ago with lower extremity weakness and fatigue.  He was then sent to neurology.  He had an MRI of his thoracic spine that was normal.  He is now walking with a walker reports mostly imbalance is the main issue not so much pain in his legs.  His thoracic stent graft was placed on 09/22/2019 for 5.9 cm descending thoracic aneurysm.  We have also been following an abdominal aortic aneurysm that last measured about 4.8 cm on a CT in January 2021.  He reports no abdominal pain otherwise.  Past Medical History:  Diagnosis Date  . AAA (abdominal aortic aneurysm) ([place])   . Angioedema 06/01/2015  . Arthritis   . Back pain   . 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 Chronic kidney disease    ? bph, pt self catheterizes himself on schedule  . Complication of anesthesia    had to have Foley post op cabg and lumb lam  . Coronary artery disease    s/p remote PCI // s/p CABG in 2010 Echo 01/2019: EF 60-65, Gr 1 DD, normal RVSF, RVSP 36, mild LAE, mild to mod TR  . Hypertension   . PVC's (premature ventricular contractions)    Monitor 12/2018:  Sinus rhythm with frequent PVC's (11.5%) and periods of bigeminy.   . Thoracic aortic aneurysm ([place])    09/29/16 CT: 4.3 cm ascending, 4.5 cm descending. 1 yr f/u rec // s/p endovascular repair 09/2019 (Dr. Carlis Abbott)  . Urinary retention   . Wears glasses     Past Surgical History:  Procedure Laterality Date  . BACK SURGERY  2012   lumb lam  . CARDIAC CATHETERIZATION     2010-per patient  . COLONOSCOPY    . CORONARY ARTERY BYPASS GRAFT  2010   triple  . EPIGASTRIC  HERNIA REPAIR N/A 09/11/2017   Procedure: HERNIA REPAIR EPIGASTRIC ADULT;  Surgeon: Rolm Bookbinder, MD;  Location: [facility];  Service: General;  Laterality: N/A;  . HERNIA REPAIR     rih 1981  . HERNIA REPAIR  09/11/2017   with mesh  . INGUINAL HERNIA REPAIR  07/30/2012   Procedure: HERNIA REPAIR INGUINAL ADULT;  Surgeon: Rolm Bookbinder, MD;  Location: [facility];  Service: General;  Laterality: Left;  Left Hernia Site  . INSERTION OF MESH  07/30/2012   Procedure: INSERTION OF MESH;  Surgeon: Rolm Bookbinder, MD;  Location: [facility];  Service: General;  Laterality: Left;  Left Inguinal Hernia  . INSERTION OF MESH N/A 09/11/2017   Procedure: INSERTION OF MESH;  Surgeon: Rolm Bookbinder, MD;  Location: [facility];  Service: General;  Laterality: N/A;  . LAPAROSCOPY N/A 09/11/2017   Procedure: DIAGNOSTIC LAPAOSCOPY;  Surgeon: Rolm Bookbinder, MD;  Location: [facility];  Service: General;  Laterality: N/A;  ERAS pathway  . SPINE SURGERY     lumbar laminectomy  . THORACIC AORTIC ENDOVASCULAR STENT GRAFT N/A 09/22/2019   Procedure: THORACIC AORTIC ENDOVASCULAR STENT GRAFT;  Surgeon: Marty Heck, MD;  Location: [facility];  Service: Vascular;  Laterality: N/A;  . TONSILLECTOMY  Family History  Problem Relation Age of Onset  . Allergic rhinitis Son   . Coronary artery disease Mother   . Angioedema Neg Hx   . Asthma Neg Hx   . Atopy Neg Hx   . Eczema Neg Hx   . Immunodeficiency Neg Hx   . Urticaria Neg Hx     SOCIAL HISTORY: Social History   Tobacco Use  . Smoking status: Former Smoker    Packs/day: 1.00    Years: 8.00    Pack years: 8.00    Quit date: 07/24/1984    Years since quitting: 35.4  . Smokeless tobacco: Never Used  Substance Use Topics  . Alcohol use: No    Allergies  Allergen Reactions  . Quinolones Other (See Comments)    Patient has an abdominal aortic aneurysm     Current Outpatient Medications  Medication Sig Dispense Refill   . aspirin 81 MG tablet Take 81 mg by mouth daily after supper.     Marland Kitchen atorvastatin (LIPITOR) 20 MG tablet TAKE 1 TABLET ONCE DAILY AT 6 PM. 90 tablet 0  . Cholecalciferol (VITAMIN D3) 1000 units CAPS Take 1,000 Units by mouth daily.    Marland Kitchen Co-Enzyme Q-10 100 MG CAPS Take 100 mg by mouth daily.     Marland Kitchen CRANBERRY EXTRACT PO Take 1 tablet by mouth daily.    Marland Kitchen DILT-XR 180 MG 24 hr capsule TAKE 1 CAPSULE DAILY. (Patient taking differently: Take 180 mg by mouth daily. ) 30 capsule 5  . ferrous sulfate 325 (65 FE) MG tablet Take 325 mg by mouth daily with breakfast.    . furosemide (LASIX) 20 MG tablet Take 20 mg by mouth daily.    Marland Kitchen gabapentin (NEURONTIN) 300 MG capsule Take 300 mg by mouth at bedtime.     Marland Kitchen ibuprofen (ADVIL) 200 MG tablet Take 200-400 mg by mouth every 6 (six) hours as needed (for pain).    Marland Kitchen loratadine (CLARITIN) 10 MG tablet Take 10 mg by mouth daily as needed for allergies or rhinitis.    . Multiple Vitamins-Minerals (PRESERVISION AREDS 2 PO) Take 1 capsule by mouth 2 (two) times daily.    . nitroGLYCERIN (NITRODUR - DOSED IN MG/24 HR) 0.2 mg/hr patch Use 1/2 patch daily to the affected area 30 patch 1  . nitroGLYCERIN (NITROSTAT) 0.6 MG SL tablet Place 0.6 mg under the tongue every 5 (five) minutes as needed for chest pain.    Marland Kitchen oxyCODONE-acetaminophen (PERCOCET) 5-325 MG tablet Take 1 tablet by mouth every 6 (six) hours as needed for severe pain. 15 tablet 0  . oxymetazoline (AFRIN) 0.05 % nasal spray Place 1 spray into both nostrils at bedtime as needed for congestion.    . pantoprazole (PROTONIX) 40 MG tablet Take 40 mg by mouth daily.    . sodium chloride (MURO 128) 5 % ophthalmic ointment Place 1 application into both eyes at bedtime.    . triamcinolone (NASACORT ALLERGY 24HR) 55 MCG/ACT AERO nasal inhaler Place 2 sprays into the nose daily as needed (for allergies).     . TURMERIC PO Take 1 capsule by mouth daily after breakfast.    . vitamin B-12 (CYANOCOBALAMIN) 1000 MCG  tablet Take 1,000 mcg by mouth daily.     . vitamin C (ASCORBIC ACID) 250 MG tablet Take 250 mg by mouth daily.     . isosorbide mononitrate (IMDUR) 30 MG 24 hr tablet Take 0.5 tablets (15 mg total) by mouth daily. 15 tablet 0   No  current facility-administered medications for this visit.    REVIEW OF SYSTEMS:  [X]  denotes positive finding, [ ]  denotes negative finding Cardiac  Comments:  Chest pain or chest pressure:    Shortness of breath upon exertion:    Short of breath when lying flat:    Irregular heart rhythm:        Vascular    Pain in calf, thigh, or hip brought on by ambulation:    Pain in feet at night that wakes you up from your sleep:     Blood clot in your veins:    Leg swelling:         Pulmonary    Oxygen at home:    Productive cough:     Wheezing:         Neurologic    Sudden weakness in arms or legs:     Sudden numbness in arms or legs:     Sudden onset of difficulty speaking or slurred speech:    Temporary loss of vision in one eye:     Problems with dizziness:         Gastrointestinal    Blood in stool:     Vomited blood:         Genitourinary    Burning when urinating:     Blood in urine:        Psychiatric    Major depression:         Hematologic    Bleeding problems:    Problems with blood clotting too easily:        Skin    Rashes or ulcers:        Constitutional    Fever or chills:      PHYSICAL EXAM: Vitals:   01/06/20 0946  BP: (!) 158/82  Pulse: 64  Resp: 16  Temp: (!) 97.3 F (36.3 C)  TempSrc: Temporal  SpO2: 98%  Weight: 144 lb (65.3 kg)  Height: 5\' 7"  (1.702 m)    GENERAL: The patient is a well-nourished male, in no acute distress. The vital signs are documented above. CARDIAC: There is a regular rate and rhythm.  VASCULAR:  Palpable femoral pulses both groins Palpable DP pulses bilaterally ABDOMEN: no pain with palpation of aneurysm  DATA:   I previously reviewed his CTA chest on 10/16/19 and the stent graft  remains well-positioned in thoracic aorta with no overt endoleak and good exclusion of the aneurysm.  Assessment/Plan:  86 year old male status post endovascular repair of descending thoracic aneurysm with stent graft on 09/22/19 for a 5.9 cm thoracic aneurysm.  His CTA chest at 1 month postop showed exclusion of the aneurysm with good seal proximally and distally.  Unfortunately has had ongoing issues with fatigue and unsteadiness.  Neurology evaluated him and I reviewed his MRI thoracic spine report with no abnormal findings and normal signal.  I am hopeful he will continue to make improvements and he is going to the gym 3 times a week which I congratulated him on.  I did offer to order additional physical therapy but he does not feel this is necessary.  I will plan to see him in 6 months with CTA chest abdomen pelvis to continue surveillance of his thoracic stent graft and also to measure his abdominal aortic aneurysm.  On duplex today that measures 5.3 cm but this is asymptomatic when I palpate this he is having no symptoms.  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   Marty Heck, MD [facility] Office: [phone]

## 2020-01-09 ENCOUNTER — Encounter: Payer: Self-pay | Admitting: Physician Assistant

## 2020-01-09 ENCOUNTER — Ambulatory Visit: Payer: Medicare Other | Admitting: Physician Assistant

## 2020-01-09 ENCOUNTER — Other Ambulatory Visit: Payer: Self-pay

## 2020-01-09 VITALS — BP 125/74 | HR 74 | Temp 98.1°F | Wt 149.2 lb

## 2020-01-09 DIAGNOSIS — E785 Hyperlipidemia, unspecified: Secondary | ICD-10-CM | POA: Diagnosis not present

## 2020-01-09 DIAGNOSIS — I5033 Acute on chronic diastolic (congestive) heart failure: Secondary | ICD-10-CM

## 2020-01-09 DIAGNOSIS — I25810 Atherosclerosis of coronary artery bypass graft(s) without angina pectoris: Secondary | ICD-10-CM

## 2020-01-09 DIAGNOSIS — I712 Thoracic aortic aneurysm, without rupture, unspecified: Secondary | ICD-10-CM

## 2020-01-09 DIAGNOSIS — I10 Essential (primary) hypertension: Secondary | ICD-10-CM | POA: Diagnosis not present

## 2020-01-09 MED ORDER — FUROSEMIDE 40 MG PO TABS: 40.0000 mg | ORAL_TABLET | Freq: Every day | ORAL | 3 refills | Status: DC

## 2020-01-09 MED ORDER — POTASSIUM CHLORIDE ER 10 MEQ PO TBCR: 10.0000 meq | EXTENDED_RELEASE_TABLET | Freq: Every day | ORAL | 3 refills | Status: DC

## 2020-01-09 NOTE — Progress Notes (Signed)
Cardiology Office Note:    Date:  01/11/2020   ID:  Andrew [name], DOB Apr 18, 1932, MRN [id number]  PCP:  [name], MD  [place] Cardiologist:  Pixie Casino, MD  [place] Electrophysiologist:  None   Referring MD: [name], MD   Chief Complaint  Patient presents with  . Follow-up    seen for Dr. Debara Pickett    History of Present Illness:    Andrew [name] is a 86 y.o. male with a hx of CAD s/p CABG 2010, HTN, thoracic aortic aneurysm, frequent PVCs, CKD and hyperlipidemia.  Patient had previous angioplasty and PCI prior to the bypass surgery in 2010.  Although there was a listed history of paroxysmal atrial fibrillation, however there was no record or documentation to support that based on the previous cardiology note.  He wore a heart monitor in 2017 that showed PACs and PVCs.  He was previously followed by Dr. Wynonia Lawman however later establish care with Dr. Debara Pickett in March 2020.  Due to increased palpitation, a heart monitor was repeated on 02/17/2019, this showed sinus rhythm with frequent PVCs up to 11.5%, and periods of bigeminy. He was asymptomatic, we decided to continue monitoring. Due to the enlarging thoracic aortic aneurysm, he was referred to cardiothoracic surgery.  In addition, he was later referred to Dr. Carlis Abbott of vascular surgery to follow-up on the abdominal aortic aneurysm.  Patient underwent endovascular repair of the descending thoracic aortic aneurysm without coverage of the left subclavian artery by Dr. Carlis Abbott using a 40 mm x 20 mm Gore Tag x2 on 09/22/2019.  Echocardiogram performed on 09/27/2019 showed EF 60 to 65%, mildly elevated pulmonary artery systolic pressure, otherwise no significant valve issue.  Myoview performed on 3/6 last 2021 showed a large defect of moderate severity present in the basal inferoseptal, basal inferior, basal inferolateral, mid inferoseptal, mid inferior and mid inferolateral location that actually improved with stress, this  is not consistent with reversible ischemia, TID was elevated to 2.09, EF 55 to 65%.  Patient presents today for cardiology office visit.  He says he has been having worsening dyspnea on exertion since his thoracic aortic surgery.  He also has noticed increasing leg edema.  On exam, he has 2-3+ pitting edema in the bilateral lower extremity.  On auscultation, he also has a markedly diminished right base of the lung, I suspect he has a mild or moderate right pleural effusion.  He clearly looks volume overloaded.  I recommended increase Lasix to 40 mg daily and added potassium 10 meq daily.  He does self-catheterization for urine, he is aware that he likely will need to self cath him more frequently on the higher dose of diuretic.  For this reason, I hope to avoid doing any twice daily dosing of diuretic.  He will need a basic metabolic panel and proBNP today.  Otherwise he will need a repeat basic metabolic panel in 1 week.  I plan to see the patient back in 2 weeks for reassessment.  Past Medical History:  Diagnosis Date  . AAA (abdominal aortic aneurysm) ([place])   . Angioedema 06/01/2015  . Arthritis   . Back pain   . 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 Chronic kidney disease    ? bph, pt self catheterizes himself on schedule  . Complication of anesthesia    had to have Foley post op cabg and lumb lam  . Coronary  artery disease    s/p remote PCI // s/p CABG in 2010 Echo 01/2019: EF 60-65, Gr 1 DD, normal RVSF, RVSP 36, mild LAE, mild to mod TR  . Hypertension   . PVC's (premature ventricular contractions)    Monitor 12/2018:  Sinus rhythm with frequent PVC's (11.5%) and periods of bigeminy.   . Thoracic aortic aneurysm ([place])    09/29/16 CT: 4.3 cm ascending, 4.5 cm descending. 1 yr f/u rec // s/p endovascular repair 09/2019 (Dr. Carlis Abbott)  . Urinary retention   . Wears glasses     Past Surgical History:  Procedure Laterality Date  . BACK SURGERY  2012     lumb lam  . CARDIAC CATHETERIZATION     2010-per patient  . COLONOSCOPY    . CORONARY ARTERY BYPASS GRAFT  2010   triple  . EPIGASTRIC HERNIA REPAIR N/A 09/11/2017   Procedure: HERNIA REPAIR EPIGASTRIC ADULT;  Surgeon: Rolm Bookbinder, MD;  Location: [facility];  Service: General;  Laterality: N/A;  . HERNIA REPAIR     rih 1981  . HERNIA REPAIR  09/11/2017   with mesh  . INGUINAL HERNIA REPAIR  07/30/2012   Procedure: HERNIA REPAIR INGUINAL ADULT;  Surgeon: Rolm Bookbinder, MD;  Location: [facility];  Service: General;  Laterality: Left;  Left Hernia Site  . INSERTION OF MESH  07/30/2012   Procedure: INSERTION OF MESH;  Surgeon: Rolm Bookbinder, MD;  Location: [facility];  Service: General;  Laterality: Left;  Left Inguinal Hernia  . INSERTION OF MESH N/A 09/11/2017   Procedure: INSERTION OF MESH;  Surgeon: Rolm Bookbinder, MD;  Location: [facility];  Service: General;  Laterality: N/A;  . LAPAROSCOPY N/A 09/11/2017   Procedure: DIAGNOSTIC LAPAOSCOPY;  Surgeon: Rolm Bookbinder, MD;  Location: [facility];  Service: General;  Laterality: N/A;  ERAS pathway  . SPINE SURGERY     lumbar laminectomy  . THORACIC AORTIC ENDOVASCULAR STENT GRAFT N/A 09/22/2019   Procedure: THORACIC AORTIC ENDOVASCULAR STENT GRAFT;  Surgeon: Marty Heck, MD;  [facility];  Service: Vascular;  Laterality: N/A;  . TONSILLECTOMY      Current Medications: Current Meds  Medication Sig  . aspirin 81 MG tablet Take 81 mg by mouth daily after supper.   Marland Kitchen atorvastatin (LIPITOR) 20 MG tablet TAKE 1 TABLET ONCE DAILY AT 6 PM.  . Cholecalciferol (VITAMIN D3) 1000 units CAPS Take 1,000 Units by mouth daily.  Marland Kitchen Co-Enzyme Q-10 100 MG CAPS Take 100 mg by mouth daily.   Marland Kitchen CRANBERRY EXTRACT PO Take 1 tablet by mouth daily.  Marland Kitchen DILT-XR 180 MG 24 hr capsule TAKE 1 CAPSULE DAILY. (Patient taking differently: Take 180 mg by mouth daily. )  . ferrous sulfate 325 (65 FE) MG tablet Take 325 mg by  mouth daily with breakfast.  . furosemide (LASIX) 40 MG tablet Take 1 tablet (40 mg total) by mouth daily.  Marland Kitchen gabapentin (NEURONTIN) 300 MG capsule Take 300 mg by mouth at bedtime.   Marland Kitchen ibuprofen (ADVIL) 200 MG tablet Take 200-400 mg by mouth every 6 (six) hours as needed (for pain).  Marland Kitchen loratadine (CLARITIN) 10 MG tablet Take 10 mg by mouth daily as needed for allergies or rhinitis.  . Multiple Vitamins-Minerals (PRESERVISION AREDS 2 PO) Take 1 capsule by mouth 2 (two) times daily.  . nitroGLYCERIN (NITRODUR - DOSED IN MG/24 HR) 0.2 mg/hr patch Use 1/2 patch daily to the affected area  . nitroGLYCERIN (NITROSTAT) 0.6 MG SL tablet Place  0.6 mg under the tongue every 5 (five) minutes as needed for chest pain.  Marland Kitchen oxyCODONE-acetaminophen (PERCOCET) 5-325 MG tablet Take 1 tablet by mouth every 6 (six) hours as needed for severe pain.  Marland Kitchen oxymetazoline (AFRIN) 0.05 % nasal spray Place 1 spray into both nostrils at bedtime as needed for congestion.  . pantoprazole (PROTONIX) 40 MG tablet Take 40 mg by mouth daily.  . sodium chloride (MURO 128) 5 % ophthalmic ointment Place 1 application into both eyes at bedtime.  . triamcinolone (NASACORT ALLERGY 24HR) 55 MCG/ACT AERO nasal inhaler Place 2 sprays into the nose daily as needed (for allergies).   . TURMERIC PO Take 1 capsule by mouth daily after breakfast.  . vitamin B-12 (CYANOCOBALAMIN) 1000 MCG tablet Take 1,000 mcg by mouth daily.   . vitamin C (ASCORBIC ACID) 250 MG tablet Take 250 mg by mouth daily.   . [DISCONTINUED] furosemide (LASIX) 20 MG tablet Take 20 mg by mouth daily.     Allergies:   Quinolones   Social History   Socioeconomic History  . Marital status: Married    Spouse name: [name]  . Number of children: Not on file  . Years of education: Not on file  . Highest education level: Not on file  Occupational History    Comment: retired  Tobacco Use  . Smoking status: Former Smoker    Packs/day: 1.00    Years: 8.00    Pack years:  8.00    Quit date: 07/24/1984    Years since quitting: 35.4  . Smokeless tobacco: Never Used  Vaping Use  . Vaping Use: Never used  Substance and Sexual Activity  . Alcohol use: No  . Drug use: No  . Sexual activity: Not on file  Other Topics Concern  . Not on file  Social History Narrative   12/08/19 lives with wife   Social Determinants of Health   Financial Resource Strain:   . Difficulty of Paying Living Expenses:   Food Insecurity:   . Worried About Charity fundraiser in the Last Year:   . Arboriculturist in the Last Year:   Transportation Needs:   . Film/video editor (Medical):   Marland Kitchen Lack of Transportation (Non-Medical):   Physical Activity:   . Days of Exercise per Week:   . Minutes of Exercise per Session:   Stress:   . Feeling of Stress :   Social Connections:   . Frequency of Communication with Friends and Family:   . Frequency of Social Gatherings with Friends and Family:   . Attends Religious Services:   . Active Member of Clubs or Organizations:   . Attends Archivist Meetings:   Marland Kitchen Marital Status:      Family History: The patient's family history includes Allergic rhinitis in his son; Coronary artery disease in his mother. There is no history of Angioedema, Asthma, Atopy, Eczema, Immunodeficiency, or Urticaria.  ROS:   Please see the history of present illness.     All other systems reviewed and are negative.  EKGs/Labs/Other Studies Reviewed:    The following studies were reviewed today:  Echo 09/27/2019 1. Left ventricular ejection fraction, by estimation, is 60 to 65%. The  left ventricle has normal function. The left ventricle has no regional  wall motion abnormalities. Left ventricular diastolic parameters were  normal.  2. Right ventricular systolic function is normal. The right ventricular  size is normal. There is mildly elevated pulmonary artery systolic  pressure.  3. The mitral valve is normal in structure and function.  Trivial mitral  valve regurgitation. No evidence of mitral stenosis.  4. The aortic valve is normal in structure and function. Aortic valve  regurgitation is not visualized. Mild aortic valve sclerosis is present,  with no evidence of aortic valve stenosis.  5. Aortic arch stent is seen.  6. The inferior vena cava is normal in size with greater than 50%  respiratory variability, suggesting right atrial pressure of 3 mmHg.   EKG:  EKG is not ordered today.    Recent Labs: 09/17/2019: ALT 11 09/22/2019: Magnesium 1.9 09/27/2019: Hemoglobin 11.7; Platelets 150 01/09/2020: BUN 32; Creatinine, Ser 1.37; NT-Pro BNP 1,018; Potassium 4.8; Sodium 142  Recent Lipid Panel No results found for: CHOL, TRIG, HDL, CHOLHDL, VLDL, LDLCALC, LDLDIRECT  Physical Exam:    VS:  BP 125/74   Pulse 74   Temp 98.1 F (36.7 C)   Wt 149 lb 3.2 oz (67.7 kg)   SpO2 93%   BMI 23.37 kg/m     Wt Readings from Last 3 Encounters:  01/09/20 149 lb 3.2 oz (67.7 kg)  01/06/20 144 lb (65.3 kg)  12/08/19 147 lb 9.6 oz (67 kg)     GEN:  Well nourished, well developed in no acute distress HEENT: Normal NECK: No JVD; No carotid bruits LYMPHATICS: No lymphadenopathy CARDIAC: RRR, no murmurs, rubs, gallops RESPIRATORY:  Clear to auscultation without rales, wheezing or rhonchi  ABDOMEN: Soft, non-tender, non-distended MUSCULOSKELETAL:  2+ pitting edema; nodeformity  SKIN: Warm and dry NEUROLOGIC:  Alert and oriented x 3 PSYCHIATRIC:  Normal affect   ASSESSMENT:    1. Acute on chronic diastolic heart failure ([place])   2. Coronary artery disease involving coronary bypass graft of native heart without angina pectoris   3. Essential hypertension   4. Hyperlipidemia LDL goal <70   5. Thoracic aortic aneurysm without rupture ([place])    PLAN:    In order of problems listed above:  1. Acute on chronic diastolic heart failure: Patient clearly appears to be volume overloaded on exam.  I recommended increase Lasix to 40  mg daily and added potassium supplement 10 meq daily.  He will need a basic metabolic panel and a proBNP today and a repeat BMET in 1 week.  2. CAD s/p CABG: Recent Myoview was negative for significant ischemia.  3. Hypertension: Blood pressure stable  4. Hyperlipidemia: Continue Lipitor  5. Thoracic aortic aneurysm: Recently underwent stent placement by vascular surgery for aortic aneurysm.   Medication Adjustments/Labs and Tests Ordered: Current medicines are reviewed at length with the patient today.  Concerns regarding medicines are outlined above.  Orders Placed This Encounter  Procedures  . Pro b natriuretic peptide (BNP)[facility])  . Basic metabolic panel  . Basic Metabolic Panel (BMET)   Meds ordered this encounter  Medications  . potassium chloride (KLOR-CON) 10 MEQ tablet    Sig: Take 1 tablet (10 mEq total) by mouth daily.    Dispense:  90 tablet    Refill:  3  . furosemide (LASIX) 40 MG tablet    Sig: Take 1 tablet (40 mg total) by mouth daily.    Dispense:  90 tablet    Refill:  3    Patient Instructions  Medication Instructions:  Start Potasium chloride 10 mg 1 Daily, Lasix 40 mg 1 Daily *If you need a refill on your cardiac medications before your next appointment, please call your pharmacy*   Lab Work:  Pro BNP today, BMP 1wk If you have labs (blood work) drawn today and your tests are completely normal, you will receive your results only by: Marland Kitchen MyChart Message (if you have MyChart) OR . A paper copy in the mail If you have any lab test that is abnormal or we need to change your treatment, we will call you to review the results.   Testing/Procedures: None   Follow-Up: At [facility], you and your health needs are our priority.  As part of our continuing mission to provide you with exceptional heart care, we have created designated Provider Care Teams.  These Care Teams include your primary Cardiologist (physician) and Advanced  Practice Providers (APPs -  Physician Assistants and Nurse Practitioners) who all work together to provide you with the care you need, when you need it.  We recommend signing up for the patient portal called "MyChart".  Sign up information is provided on this After Visit Summary.  MyChart is used to connect with patients for Virtual Visits (Telemedicine).  Patients are able to view lab/test results, encounter notes, upcoming appointments, etc.  Non-urgent messages can be sent to your provider as well.   To learn more about what you can do with MyChart, go to [email or web address].    Your next appointment:   2 weeks  The format for your next appointment:   In Person  Provider:   Almyra Deforest PA-C       Signed, Almyra Deforest, [place]  01/11/2020 11:48 PM    [place]

## 2020-01-09 NOTE — Patient Instructions (Signed)
Medication Instructions:  Start Potasium chloride 10 mg 1 Daily, Lasix 40 mg 1 Daily *If you need a refill on your cardiac medications before your next appointment, please call your pharmacy*   Lab Work: Pro BNP today, BMP 1wk If you have labs (blood work) drawn today and your tests are completely normal, you will receive your results only by: Marland Kitchen MyChart Message (if you have MyChart) OR . A paper copy in the mail If you have any lab test that is abnormal or we need to change your treatment, we will call you to review the results.   Testing/Procedures: None   Follow-Up: At [facility], you and your health needs are our priority.  As part of our continuing mission to provide you with exceptional heart care, we have created designated Provider Care Teams.  These Care Teams include your primary Cardiologist (physician) and Advanced Practice Providers (APPs -  Physician Assistants and Nurse Practitioners) who all work together to provide you with the care you need, when you need it.  We recommend signing up for the patient portal called "MyChart".  Sign up information is provided on this After Visit Summary.  MyChart is used to connect with patients for Virtual Visits (Telemedicine).  Patients are able to view lab/test results, encounter notes, upcoming appointments, etc.  Non-urgent messages can be sent to your provider as well.   To learn more about what you can do with MyChart, go to [email or web address].    Your next appointment:   2 weeks  The format for your next appointment:   In Person  Provider:   Almyra Deforest PA-C

## 2020-01-10 LAB — BASIC METABOLIC PANEL
BUN/Creatinine Ratio: 23 (ref 10–24)
BUN: 32 mg/dL — ABNORMAL HIGH (ref 8–27)
CO2: 27 mmol/L (ref 20–29)
Calcium: 9.5 mg/dL (ref 8.6–10.2)
Chloride: 100 mmol/L (ref 96–106)
Creatinine, Ser: 1.37 mg/dL — ABNORMAL HIGH (ref 0.76–1.27)
GFR calc Af Amer: 54 mL/min/{1.73_m2} — ABNORMAL LOW (ref >59)
GFR calc non Af Amer: 47 mL/min/{1.73_m2} — ABNORMAL LOW (ref >59)
Glucose: 91 mg/dL (ref 65–99)
Potassium: 4.8 mmol/L (ref 3.5–5.2)
Sodium: 142 mmol/L (ref 134–144)

## 2020-01-10 LAB — PRO B NATRIURETIC PEPTIDE: NT-Pro BNP: 1018 pg/mL — ABNORMAL HIGH (ref 0–486)

## 2020-01-11 ENCOUNTER — Encounter: Payer: Self-pay | Admitting: Physician Assistant

## 2020-01-11 NOTE — Progress Notes (Signed)
Kidney function worsened slightly likely related to volume overload. ProBNP elevated also consistent with volume overload. Recommend a Limited Echocardiogram to reassess his ejection fraction given this new onset of volume symptom. This will be compared to his previous echo in March

## 2020-01-12 ENCOUNTER — Telehealth: Payer: Self-pay | Admitting: Physician Assistant

## 2020-01-12 ENCOUNTER — Telehealth: Payer: Self-pay | Admitting: *Deleted

## 2020-01-12 DIAGNOSIS — I5033 Acute on chronic diastolic (congestive) heart failure: Secondary | ICD-10-CM

## 2020-01-12 NOTE — Telephone Encounter (Signed)
New message ° ° °Patient is returning call for lab results. Please call. °

## 2020-01-12 NOTE — Telephone Encounter (Signed)
Patient called with lab results. Aware limited echo was ordered - will need to be scheduled. He is coming in for [facility] on Friday June 25 and has f/u with PA on July 1

## 2020-01-12 NOTE — Telephone Encounter (Signed)
-----   Message from [place], [place] sent at 01/11/2020 11:47 PM EDT ----- Kidney function worsened slightly likely related to volume overload. ProBNP elevated also consistent with volume overload. Recommend a Limited Echocardiogram to reassess his ejection fraction given this new onset of volume symptom. This will be compa red to his previous echo in March

## 2020-01-12 NOTE — Telephone Encounter (Signed)
Left message for pt to call.

## 2020-01-13 NOTE — Telephone Encounter (Signed)
Pt voiced he was calling to get ECHO scheduled but just spoke to a scheduler. Pt scheduled for 6/24 at 8:30 am.

## 2020-01-13 NOTE — Telephone Encounter (Signed)
Follow up   Patient would like a call to go over his lab results. Please call.

## 2020-01-15 ENCOUNTER — Ambulatory Visit (HOSPITAL_COMMUNITY): Payer: Medicare Other | Attending: Cardiology

## 2020-01-15 ENCOUNTER — Other Ambulatory Visit: Payer: Self-pay

## 2020-01-15 DIAGNOSIS — I5033 Acute on chronic diastolic (congestive) heart failure: Secondary | ICD-10-CM

## 2020-01-16 ENCOUNTER — Telehealth: Payer: Self-pay

## 2020-01-16 LAB — BASIC METABOLIC PANEL
BUN/Creatinine Ratio: 23 (ref 10–24)
BUN: 31 mg/dL — ABNORMAL HIGH (ref 8–27)
CO2: 28 mmol/L (ref 20–29)
Calcium: 9.3 mg/dL (ref 8.6–10.2)
Chloride: 96 mmol/L (ref 96–106)
Creatinine, Ser: 1.35 mg/dL — ABNORMAL HIGH (ref 0.76–1.27)
GFR calc Af Amer: 55 mL/min/{1.73_m2} — ABNORMAL LOW (ref >59)
GFR calc non Af Amer: 48 mL/min/{1.73_m2} — ABNORMAL LOW (ref >59)
Glucose: 80 mg/dL (ref 65–99)
Potassium: 4.7 mmol/L (ref 3.5–5.2)
Sodium: 140 mmol/L (ref 134–144)

## 2020-01-16 NOTE — Telephone Encounter (Signed)
Pt called requesting a letter is given to his dentist stating that Dr. Carlis Abbott has approved him to get dental care. Dr. Carlis Abbott is aware and approved this and a letter stating this has been faxed to Dr. Evelene Croon, [place] office per Dr. Carlis Abbott.

## 2020-01-22 ENCOUNTER — Ambulatory Visit: Payer: Medicare Other | Admitting: Physician Assistant

## 2020-01-22 ENCOUNTER — Encounter: Payer: Self-pay | Admitting: Physician Assistant

## 2020-01-22 ENCOUNTER — Other Ambulatory Visit: Payer: Self-pay

## 2020-01-22 VITALS — BP 102/62 | HR 72 | Ht 67.0 in | Wt 147.0 lb

## 2020-01-22 DIAGNOSIS — Z79899 Other long term (current) drug therapy: Secondary | ICD-10-CM | POA: Diagnosis not present

## 2020-01-22 DIAGNOSIS — I5033 Acute on chronic diastolic (congestive) heart failure: Secondary | ICD-10-CM

## 2020-01-22 DIAGNOSIS — I25709 Atherosclerosis of coronary artery bypass graft(s), unspecified, with unspecified angina pectoris: Secondary | ICD-10-CM

## 2020-01-22 DIAGNOSIS — I712 Thoracic aortic aneurysm, without rupture, unspecified: Secondary | ICD-10-CM

## 2020-01-22 DIAGNOSIS — I10 Essential (primary) hypertension: Secondary | ICD-10-CM

## 2020-01-22 DIAGNOSIS — N183 Chronic kidney disease, stage 3 unspecified: Secondary | ICD-10-CM

## 2020-01-22 DIAGNOSIS — E785 Hyperlipidemia, unspecified: Secondary | ICD-10-CM

## 2020-01-22 MED ORDER — POTASSIUM CHLORIDE ER 20 MEQ PO TBCR: 20.0000 meq | EXTENDED_RELEASE_TABLET | Freq: Every day | ORAL | 3 refills | Status: DC

## 2020-01-22 MED ORDER — FUROSEMIDE 80 MG PO TABS: 80.0000 mg | ORAL_TABLET | Freq: Every day | ORAL | 3 refills | Status: DC

## 2020-01-22 NOTE — Progress Notes (Signed)
Cardiology Office Note:    Date:  01/24/2020   ID:  Andrew [name], DOB 04/10/1932, MRN [id number]  PCP:  [name], MD  [place] Cardiologist:  Pixie Casino, MD  [place] Electrophysiologist:  None   Referring MD: [name], MD   Chief Complaint  Patient presents with  . Follow-up    leg edema, seen for Dr. Debara Pickett    History of Present Illness:    Andrew [name] is a 86 y.o. male with a hx of CAD s/p CABG 2010, HTN, thoracic aortic aneurysm, frequent PVCs, CKD and hyperlipidemia.  Patient had previous angioplasty and PCI prior to the bypass surgery in 2010.  Although there was a listed history of paroxysmal atrial fibrillation, however there was no record or documentation to support that based on the previous cardiology note.  He wore a heart monitor in 2017 that showed PACs and PVCs.  He was previously followed by Dr. Wynonia Lawman however later establish care with Dr. Debara Pickett in March 2020. Due to increased palpitation, a heart monitor was repeated on 02/17/2019, this showed sinus rhythm with frequent PVCs up to 11.5%, and periods of bigeminy. He was asymptomatic, it was decided to continue monitoring. Due to the enlarging thoracic aortic aneurysm, he was referred to cardiothoracic surgery.  In addition, he was later referred to Dr. Carlis Abbott of vascular surgery to follow-up on the abdominal aortic aneurysm.  Patient underwent endovascular repair of the descending thoracic aortic aneurysm without coverage of the left subclavian artery by Dr. Carlis Abbott using 40 mm x 20 mm Gore Tag x2 on 09/22/2019.  Echocardiogram performed on 09/27/2019 showed EF 60 to 65%, mildly elevated pulmonary artery systolic pressure, otherwise no significant valve issue.  Myoview performed on 3/6 last 2021 showed a large defect of moderate severity present in the basal inferoseptal, basal inferior, basal inferolateral, mid inferoseptal, mid inferior and mid inferolateral location that actually improved with  stress, this is not consistent with reversible ischemia, TID was elevated to 2.09, EF 55 to 65%.  I last saw the patient on 01/09/2020 for evaluation of volume overload.  He was clearly volume overloaded at the time based on physical exam with 2+ pitting edema in the lower extremity.  I recommended to increase Lasix to 40 mg daily and added potassium 10 mEq daily.  Since he does self bladder catheterization, I was hesitant to do twice a day dosing of diuretic.  proBNP was consistent with volume overload, repeat basic metabolic panel in 1 week showed a stable renal function.  AAA Doppler obtained on 01/06/2020 showed largest diameter for abdominal aorta was 5.3 x 4.8 cm.  This was being followed by Dr. Carlis Abbott of vascular surgery.  Limited echocardiogram was obtained on 01/15/2020 which showed EF 60 to 65%, grade 1 DD, mild dilatation of the ascending aorta measuring at 37 mm.  Patient presents today for follow-up, even though he has managed to lose 2 pounds, he continued to have significant lower extremity edema.  I recommended increase Lasix to 80 mg daily and increase potassium supplement to 20 meq daily.  He will need 1 week basic metabolic panel and a follow-up in 2 weeks.  At this time, he continued to have intermittent chest pain upon exertion, this is similar to his chest pain earlier this year.  Myoview in March was negative.  However if his chest pain worsens with increase in frequency or duration then he may need additional ischemic work-up.  On his lung exam,  the right pleural effusion seems to be improving when compared to the previous exam however his inspiratory air movement seems to be slightly delayed.  O2 saturation was initially 88% on arrival, however after resting, O2 saturation was over 90% on repeat check.  Past Medical History:  Diagnosis Date  . AAA (abdominal aortic aneurysm) ([place])   . Angioedema 06/01/2015  . Arthritis   . Back pain   . Cancer ([place])    squamous cell skin cancer  carcinomas removed from hand and face  . Chronic kidney disease    ? bph, pt self catheterizes himself on schedule  . Complication of anesthesia    had to have Foley post op cabg and lumb lam  . Coronary artery disease    s/p remote PCI // s/p CABG in 2010 Echo 01/2019: EF 60-65, Gr 1 DD, normal RVSF, RVSP 36, mild LAE, mild to mod TR  . Hypertension   . PVC's (premature ventricular contractions)    Monitor 12/2018:  Sinus rhythm with frequent PVC's (11.5%) and periods of bigeminy.   . Thoracic aortic aneurysm ([place])    09/29/16 CT: 4.3 cm ascending, 4.5 cm descending. 1 yr f/u rec // s/p endovascular repair 09/2019 (Dr. Carlis Abbott)  . Urinary retention   . Wears glasses     Past Surgical History:  Procedure Laterality Date  . BACK SURGERY  2012   lumb lam  . CARDIAC CATHETERIZATION     2010-per patient  . COLONOSCOPY    . CORONARY ARTERY BYPASS GRAFT  2010   triple  . EPIGASTRIC HERNIA REPAIR N/A 09/11/2017   Procedure: HERNIA REPAIR EPIGASTRIC ADULT;  Surgeon: Rolm Bookbinder, MD;  Location: [facility];  Service: General;  Laterality: N/A;  . HERNIA REPAIR     rih 1981  . HERNIA REPAIR  09/11/2017   with mesh  . INGUINAL HERNIA REPAIR  07/30/2012   Procedure: HERNIA REPAIR INGUINAL ADULT;  Surgeon: Rolm Bookbinder, MD;  Location: [facility];  Service: General;  Laterality: Left;  Left Hernia Site  . INSERTION OF MESH  07/30/2012   Procedure: INSERTION OF MESH;  Surgeon: Rolm Bookbinder, MD;  Location: [facility];  Service: General;  Laterality: Left;  Left Inguinal Hernia  . INSERTION OF MESH N/A 09/11/2017   Procedure: INSERTION OF MESH;  Surgeon: Rolm Bookbinder, MD;  Location: [facility];  Service: General;  Laterality: N/A;  . LAPAROSCOPY N/A 09/11/2017   Procedure: DIAGNOSTIC LAPAOSCOPY;  Surgeon: Rolm Bookbinder, MD;  Location: [facility];  Service: General;  Laterality: N/A;  ERAS pathway  . SPINE SURGERY     lumbar laminectomy  . THORACIC AORTIC  ENDOVASCULAR STENT GRAFT N/A 09/22/2019   Procedure: THORACIC AORTIC ENDOVASCULAR STENT GRAFT;  Surgeon: Marty Heck, MD;  Location: [facility] OR;  Service: Vascular;  Laterality: N/A;  . TONSILLECTOMY      Current Medications: Current Meds  Medication Sig  . aspirin 81 MG tablet Take 81 mg by mouth daily after supper.   Marland Kitchen atorvastatin (LIPITOR) 20 MG tablet TAKE 1 TABLET ONCE DAILY AT 6 PM.  . Cholecalciferol (VITAMIN D3) 1000 units CAPS Take 1,000 Units by mouth daily.  Marland Kitchen Co-Enzyme Q-10 100 MG CAPS Take 100 mg by mouth daily.   Marland Kitchen CRANBERRY EXTRACT PO Take 1 tablet by mouth daily.  Marland Kitchen DILT-XR 180 MG 24 hr capsule TAKE 1 CAPSULE DAILY. (Patient taking differently: Take 180 mg by mouth daily. )  . ferrous sulfate 325 (65 FE) MG tablet  Take 325 mg by mouth daily with breakfast.  . furosemide (LASIX) 80 MG tablet Take 1 tablet (80 mg total) by mouth daily.  Marland Kitchen gabapentin (NEURONTIN) 300 MG capsule Take 300 mg by mouth at bedtime.   Marland Kitchen ibuprofen (ADVIL) 200 MG tablet Take 200-400 mg by mouth every 6 (six) hours as needed (for pain).  . isosorbide mononitrate (IMDUR) 30 MG 24 hr tablet Take 0.5 tablets (15 mg total) by mouth daily.  Marland Kitchen loratadine (CLARITIN) 10 MG tablet Take 10 mg by mouth daily as needed for allergies or rhinitis.  . Multiple Vitamins-Minerals (PRESERVISION AREDS 2 PO) Take 1 capsule by mouth 2 (two) times daily.  . nitroGLYCERIN (NITRODUR - DOSED IN MG/24 HR) 0.2 mg/hr patch Use 1/2 patch daily to the affected area  . nitroGLYCERIN (NITROSTAT) 0.6 MG SL tablet Place 0.6 mg under the tongue every 5 (five) minutes as needed for chest pain.  Marland Kitchen oxyCODONE-acetaminophen (PERCOCET) 5-325 MG tablet Take 1 tablet by mouth every 6 (six) hours as needed for severe pain.  Marland Kitchen oxymetazoline (AFRIN) 0.05 % nasal spray Place 1 spray into both nostrils at bedtime as needed for congestion.  . pantoprazole (PROTONIX) 40 MG tablet Take 40 mg by mouth daily.  . potassium chloride 20 MEQ TBCR Take 20  mEq by mouth daily.  . sodium chloride (MURO 128) 5 % ophthalmic ointment Place 1 application into both eyes at bedtime.  . triamcinolone (NASACORT ALLERGY 24HR) 55 MCG/ACT AERO nasal inhaler Place 2 sprays into the nose daily as needed (for allergies).   . TURMERIC PO Take 1 capsule by mouth daily after breakfast.  . vitamin B-12 (CYANOCOBALAMIN) 1000 MCG tablet Take 1,000 mcg by mouth daily.   . vitamin C (ASCORBIC ACID) 250 MG tablet Take 250 mg by mouth daily.   . [DISCONTINUED] furosemide (LASIX) 40 MG tablet Take 1 tablet (40 mg total) by mouth daily.  . [DISCONTINUED] potassium chloride (KLOR-CON) 10 MEQ tablet Take 1 tablet (10 mEq total) by mouth daily.     Allergies:   Quinolones   Social History   Socioeconomic History  . Marital status: Married    Spouse name: [name]  . Number of children: Not on file  . Years of education: Not on file  . Highest education level: Not on file  Occupational History    Comment: retired  Tobacco Use  . Smoking status: Former Smoker    Packs/day: 1.00    Years: 8.00    Pack years: 8.00    Quit date: 07/24/1984    Years since quitting: 35.5  . Smokeless tobacco: Never Used  Vaping Use  . Vaping Use: Never used  Substance and Sexual Activity  . Alcohol use: No  . Drug use: No  . Sexual activity: Not on file  Other Topics Concern  . Not on file  Social History Narrative   12/08/19 lives with wife   Social Determinants of Health   Financial Resource Strain:   . Difficulty of Paying Living Expenses:   Food Insecurity:   . Worried About Charity fundraiser in the Last Year:   . Arboriculturist in the Last Year:   Transportation Needs:   . Film/video editor (Medical):   Marland Kitchen Lack of Transportation (Non-Medical):   Physical Activity:   . Days of Exercise per Week:   . Minutes of Exercise per Session:   Stress:   . Feeling of Stress :   Social Connections:   . Frequency  of Communication with Friends and Family:   . Frequency of  Social Gatherings with Friends and Family:   . Attends Religious Services:   . Active Member of Clubs or Organizations:   . Attends Archivist Meetings:   Marland Kitchen Marital Status:      Family History: The patient's family history includes Allergic rhinitis in his son; Coronary artery disease in his mother. There is no history of Angioedema, Asthma, Atopy, Eczema, Immunodeficiency, or Urticaria.  ROS:   Please see the history of present illness.     All other systems reviewed and are negative.  EKGs/Labs/Other Studies Reviewed:    The following studies were reviewed today:  Limited Echo 01/15/2020 1. Left ventricular ejection fraction, by estimation, is 60 to 65%. The  left ventricle has normal function. The left ventricle has no regional  wall motion abnormalities. Left ventricular diastolic parameters are  consistent with Grade I diastolic  dysfunction (impaired relaxation). The average left ventricular global  longitudinal strain is -15.2 %. The global longitudinal strain is normal.  2. Right ventricular systolic function is mildly reduced. The right  ventricular size is normal. There is normal pulmonary artery systolic  pressure. The estimated right ventricular systolic pressure is 61.6 mmHg.  3. Right atrial size was mildly dilated.  4. The mitral valve is normal in structure. Trivial mitral valve  regurgitation. No evidence of mitral stenosis.  5. Tricuspid valve regurgitation is moderate.  6. The aortic valve is normal in structure. Aortic valve regurgitation is  not visualized. Mild to moderate aortic valve sclerosis/calcification is  present, without any evidence of aortic stenosis.  7. Aortic dilatation noted. There is mild dilatation of the ascending  aorta measuring 37 mm.  8. The inferior vena cava is normal in size with greater than 50%  respiratory variability, suggesting right atrial pressure of 3 mmHg.   EKG:  EKG is ordered today.  The ekg ordered  today demonstrates NSR with RBBB  Recent Labs: 09/17/2019: ALT 11 09/22/2019: Magnesium 1.9 09/27/2019: Hemoglobin 11.7; Platelets 150 01/09/2020: NT-Pro BNP 1,018 01/16/2020: BUN 31; Creatinine, Ser 1.35; Potassium 4.7; Sodium 140  Recent Lipid Panel No results found for: CHOL, TRIG, HDL, CHOLHDL, VLDL, LDLCALC, LDLDIRECT  Physical Exam:    VS:  BP 102/62   Pulse 72   Ht 5\' 7"  (1.702 m)   Wt 147 lb (66.7 kg)   SpO2 (!) 82%   BMI 23.02 kg/m     Wt Readings from Last 3 Encounters:  01/22/20 147 lb (66.7 kg)  01/09/20 149 lb 3.2 oz (67.7 kg)  01/06/20 144 lb (65.3 kg)     GEN:  Well nourished, well developed in no acute distress HEENT: Normal NECK: No JVD; No carotid bruits LYMPHATICS: No lymphadenopathy CARDIAC: RRR, no murmurs, rubs, gallops RESPIRATORY:  Clear to auscultation without rales, wheezing or rhonchi  ABDOMEN: Soft, non-tender, non-distended MUSCULOSKELETAL:  2+ edema; No deformity  SKIN: Warm and dry NEUROLOGIC:  Alert and oriented x 3 PSYCHIATRIC:  Normal affect   ASSESSMENT:    1. Acute on chronic diastolic heart failure ([place])   2. Coronary artery disease involving coronary bypass graft of native heart with angina pectoris ([place])   3. Medication management   4. Essential hypertension   5. Thoracic aortic aneurysm without rupture ([place])   6. Hyperlipidemia LDL goal <70   7. Stage 3 chronic kidney disease, unspecified whether stage 3a or 3b CKD    PLAN:    In order of problems listed  above:  1. Acute on chronic diastolic heart failure: Increase Lasix to 80 mg daily and potassium chloride to 20 Mclaurin daily.  Patient continued to have significant lower extremity edema.  2. CAD s/p CABG: Patient continued to have intermittent chest pain since earlier this year, Myoview in March was reassuring.  If symptoms increase in frequency or duration, we may have to consider additional ischemic work-up  3. Hypertension: Blood pressure stable  4. Thoracic aortic  aneurysm: Followed by vascular surgery Dr. Carlis Abbott  5. Hyperlipidemia: On Lipitor  6. CKD stage III: Repeat basic metabolic panel in 1 week.   Medication Adjustments/Labs and Tests Ordered: Current medicines are reviewed at length with the patient today.  Concerns regarding medicines are outlined above.  Orders Placed This Encounter  Procedures  . Basic metabolic panel  . EKG 12-Lead   Meds ordered this encounter  Medications  . furosemide (LASIX) 80 MG tablet    Sig: Take 1 tablet (80 mg total) by mouth daily.    Dispense:  90 tablet    Refill:  3  . potassium chloride 20 MEQ TBCR    Sig: Take 20 mEq by mouth daily.    Dispense:  90 tablet    Refill:  3    Patient Instructions  Medication Instructions:   INCREASE Lasix to 80 mg daily  INCREASE Potassium to 20 meq daily  *If you need a refill on your cardiac medications before your next appointment, please call your pharmacy*  Lab Work: Your physician recommends that you return for lab work in [place] 01/29/20:   BMET If you have labs (blood work) drawn today and your tests are completely normal, you will receive your results only by: Marland Kitchen MyChart Message (if you have MyChart) OR . A paper copy in the mail If you have any lab test that is abnormal or we need to change your treatment, we will call you to review the results.  Testing/Procedures: NONE ordered at this time of appointment   Follow-Up: At [facility], you and your health needs are our priority.  As part of our continuing mission to provide you with exceptional heart care, we have created designated Provider Care Teams.  These Care Teams include your primary Cardiologist (physician) and Advanced Practice Providers (APPs -  Physician Assistants and Nurse Practitioners) who all work together to provide you with the care you need, when you need it.   Your next appointment:   2 week(s)  The format for your next appointment:   In Person  Provider:   Jory Sims, DNP, ANP  Other Instructions      Signed, Almyra Deforest, [place]  01/24/2020 [place]

## 2020-01-22 NOTE — Patient Instructions (Addendum)
Medication Instructions:   INCREASE Lasix to 80 mg daily  INCREASE Potassium to 20 meq daily  *If you need a refill on your cardiac medications before your next appointment, please call your pharmacy*  Lab Work: Your physician recommends that you return for lab work in [place] 01/29/20:   BMET If you have labs (blood work) drawn today and your tests are completely normal, you will receive your results only by: Marland Kitchen MyChart Message (if you have MyChart) OR . A paper copy in the mail If you have any lab test that is abnormal or we need to change your treatment, we will call you to review the results.  Testing/Procedures: NONE ordered at this time of appointment   Follow-Up: At [facility], you and your health needs are our priority.  As part of our continuing mission to provide you with exceptional heart care, we have created designated Provider Care Teams.  These Care Teams include your primary Cardiologist (physician) and Advanced Practice Providers (APPs -  Physician Assistants and Nurse Practitioners) who all work together to provide you with the care you need, when you need it.   Your next appointment:   2 week(s)  The format for your next appointment:   In Person  Provider:   Jory Sims, DNP, ANP  Other Instructions

## 2020-01-24 ENCOUNTER — Encounter: Payer: Self-pay | Admitting: Physician Assistant

## 2020-01-29 ENCOUNTER — Other Ambulatory Visit: Payer: Self-pay

## 2020-01-29 DIAGNOSIS — Z79899 Other long term (current) drug therapy: Secondary | ICD-10-CM

## 2020-01-30 LAB — BASIC METABOLIC PANEL
BUN/Creatinine Ratio: 25 — ABNORMAL HIGH (ref 10–24)
BUN: 31 mg/dL — ABNORMAL HIGH (ref 8–27)
CO2: 29 mmol/L (ref 20–29)
Calcium: 9.3 mg/dL (ref 8.6–10.2)
Chloride: 97 mmol/L (ref 96–106)
Creatinine, Ser: 1.24 mg/dL (ref 0.76–1.27)
GFR calc Af Amer: 61 mL/min/{1.73_m2} (ref >59)
GFR calc non Af Amer: 53 mL/min/{1.73_m2} — ABNORMAL LOW (ref >59)
Glucose: 114 mg/dL — ABNORMAL HIGH (ref 65–99)
Potassium: 4.3 mmol/L (ref 3.5–5.2)
Sodium: 142 mmol/L (ref 134–144)

## 2020-02-01 ENCOUNTER — Other Ambulatory Visit: Payer: Self-pay | Admitting: Internal Medicine

## 2020-02-03 NOTE — Progress Notes (Signed)
Cardiology Office Note   Date:  02/04/2020   ID:  Andrew [name], DOB 04/26/1932, MRN [id number]  PCP:  [name], MD  Cardiologist:  Dr. Debara Pickett  No chief complaint on file.    History of Present Illness: Andrew [name] is a 86 y.o. male who presents for ongoing assessment and management of coronary artery disease, history of CABG in 2010, thoracic aortic aneurysm, hypertension, hyperlipidemia, and CKD.  He also has frequent PVCs which were noted on cardiac monitor on 02/10/2019.  Due to the enlarging thoracic aortic aneurysm he was referred to cardiothoracic surgery, and later referred to Dr. Carlis Abbott of vascular surgery to follow-up on abdominal aortic aneurysm.  Patient subsequently underwent repair of descending thoracic aortic aneurysm without coverage of the left subclavian artery by Dr. Carlis Abbott on 09/22/2019.  Echocardiogram in March 2021 revealed normal LVEF of 60% to 65% with mildly elevated pulmonary artery systolic pressure otherwise no significant valve issues.  Stress Myoview on 09/27/2019 revealed large defect of moderate severity present in the basal inferior septal basal inferior basal inferior lateral mid inferior septal mid inferior and mid inferior lateral location, which actually improved with stress and was not consistent with reversible ischemia.  3 times daily was elevated 2.09.  EF was 55 to 65%.  Follow-up Doppler ultrasound of AAA on 01/06/2020 showed largest diameter of of the abdominal aorta was 5.3 x 4.8 cm.  The patient was continued to be followed by Dr. Carlis Abbott for surveillance.  On last office visit on 01/22/2020 the patient was seen by Almyra Deforest, PA.  This was on follow-up for volume overload at which time he had increased his diuretic therapy.  On that follow-up visit he continued to have significant lower extremity edema and therefore   Lasix was increased to 80 mg daily along with increased potassium supplement to 20 mEq daily.  He was also complaining of some mild  chest pain.  Once diuresed, if discomfort continued, it was recommended that he have an additional ischemic work-up.  Follow-up labs on 01/29/2020 revealed a potassium 4.3, creatinine 1.24, GFR of 53.  Weight at that office visit was 143 pounds (66.7 kg).  He comes today feeling much better with no lower extremity edema.  Weight is essentially unchanged only down 1 pound.  He states that his legs are much better with no further swelling.  He is wearing support hose.  Blood pressure slightly elevated today.  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 bph, pt self catheterizes himself on schedule   Complication of anesthesia    had to have Foley post op cabg and lumb lam   Coronary artery disease    s/p remote PCI // s/p CABG in 2010 Echo 01/2019: EF 60-65, Gr 1 DD, normal RVSF, RVSP 36, mild LAE, mild to mod TR   Hypertension    PVC's (premature ventricular contractions)    Monitor 12/2018:  Sinus rhythm with frequent PVC's (11.5%) and periods of bigeminy.    Thoracic aortic aneurysm ([place])    09/29/16 CT: 4.3 cm ascending, 4.5 cm descending. 1 yr f/u rec // s/p endovascular repair 09/2019 (Dr. Carlis Abbott)   Urinary retention    Wears glasses     Past Surgical  History:  Procedure Laterality Date   BACK SURGERY  2012   lumb lam   CARDIAC CATHETERIZATION     2010-per patient   COLONOSCOPY     CORONARY ARTERY BYPASS GRAFT  2010   triple   EPIGASTRIC HERNIA REPAIR N/A 09/11/2017   Procedure: HERNIA REPAIR EPIGASTRIC ADULT;  Surgeon: Rolm Bookbinder, MD;  Location: [facility];  Service: General;  Laterality: N/A;   HERNIA REPAIR     rih Duchesne  09/11/2017   with mesh   INGUINAL HERNIA REPAIR  07/30/2012   Procedure: HERNIA REPAIR INGUINAL ADULT;  Surgeon: Rolm Bookbinder, MD;  Location: [facility];  Service: General;  Laterality: Left;  Left Hernia Site   INSERTION OF MESH  07/30/2012   Procedure: INSERTION OF MESH;  Surgeon: Rolm Bookbinder, MD;  Location: [facility];  Service: General;  Laterality: Left;  Left Inguinal Hernia   INSERTION OF MESH N/A 09/11/2017   Procedure: INSERTION OF MESH;  Surgeon: Rolm Bookbinder, MD;  Location: [facility];  Service: General;  Laterality: N/A;   LAPAROSCOPY N/A 09/11/2017   Procedure: DIAGNOSTIC LAPAOSCOPY;  Surgeon: Rolm Bookbinder, MD;  Location: [facility];  Service: General;  Laterality: N/A;  ERAS pathway   SPINE SURGERY     lumbar laminectomy   THORACIC AORTIC ENDOVASCULAR STENT GRAFT N/A 09/22/2019   Procedure: THORACIC AORTIC ENDOVASCULAR STENT GRAFT;  Surgeon: Marty Heck, MD;  Location: [facility];  Service: Vascular;  Laterality: N/A;   TONSILLECTOMY       Current Outpatient Medications  Medication Sig Dispense Refill   aspirin 81 MG tablet Take 81 mg by mouth daily after supper.      atorvastatin (LIPITOR) 20 MG tablet TAKE 1 TABLET ONCE DAILY AT 6 PM. 90 tablet 0   Cholecalciferol (VITAMIN D3) 1000 units CAPS Take 1,000 Units by mouth daily.     Co-Enzyme Q-10 100 MG CAPS Take 100 mg by mouth daily.      CRANBERRY EXTRACT PO Take 1 tablet by mouth daily.     diltiazem (DILACOR XR) 180 MG 24 hr capsule TAKE 1 CAPSULE DAILY. 30 capsule 0   ferrous sulfate 325 (65 FE) MG tablet Take 325 mg by mouth daily with breakfast.     furosemide (LASIX) 80 MG tablet Take 1 tablet (80 mg total) by mouth daily. 90 tablet 3   gabapentin (NEURONTIN) 300 MG capsule Take 300 mg by mouth at bedtime.      ibuprofen (ADVIL) 200 MG tablet Take 200-400 mg by mouth every 6 (six) hours as needed (for pain).     loratadine (CLARITIN) 10 MG tablet Take 10 mg by mouth daily as needed for allergies or rhinitis.     Multiple Vitamins-Minerals (PRESERVISION AREDS 2 PO) Take 1  capsule by mouth 2 (two) times daily.     nitroGLYCERIN (NITRODUR - DOSED IN MG/24 HR) 0.2 mg/hr patch Use 1/2 patch daily to the affected area 30 patch 1   oxymetazoline (AFRIN) 0.05 % nasal spray Place 1 spray into both nostrils at bedtime as needed for congestion.     pantoprazole (PROTONIX) 40 MG tablet Take 40 mg by mouth daily.     potassium chloride 20 MEQ TBCR Take 20 mEq by mouth daily. 90 tablet 3   sodium chloride (MURO 128) 5 % ophthalmic ointment Place 1 application into both eyes at bedtime.     triamcinolone (NASACORT ALLERGY 24HR) 55 MCG/ACT AERO nasal inhaler Place 2  sprays into the nose daily as needed (for allergies).      TURMERIC PO Take 1 capsule by mouth daily after breakfast.     vitamin B-12 (CYANOCOBALAMIN) 1000 MCG tablet Take 1,000 mcg by mouth daily.      vitamin C (ASCORBIC ACID) 250 MG tablet Take 250 mg by mouth daily.      isosorbide mononitrate (IMDUR) 30 MG 24 hr tablet Take 0.5 tablets (15 mg total) by mouth daily. 15 tablet 0   nitroGLYCERIN (NITROSTAT) 0.6 MG SL tablet Place 0.6 mg under the tongue every 5 (five) minutes as needed for chest pain. (Patient not taking: Reported on 02/04/2020)     oxyCODONE-acetaminophen (PERCOCET) 5-325 MG tablet Take 1 tablet by mouth every 6 (six) hours as needed for severe pain. (Patient not taking: Reported on 02/04/2020) 15 tablet 0   No current facility-administered medications for this visit.    Allergies:   Quinolones    Social History:  The patient  reports that he quit smoking about 35 years ago. He has a 8.00 pack-year smoking history. He has never used smokeless tobacco. He reports that he does not drink alcohol and does not use drugs.   Family History:  The patient's family history includes Allergic rhinitis in his son; Coronary artery disease in his mother.    ROS: All other systems are reviewed and negative. Unless otherwise mentioned in H&P    PHYSICAL EXAM: VS:  BP (!) 152/90    Pulse 74     Wt 146 lb 8 oz (66.5 kg)    SpO2 96%    BMI 22.95 kg/m  , BMI Body mass index is 22.95 kg/m. GEN: Well nourished, well developed, in no acute distress HEENT: normal Neck: no JVD, carotid bruits, or masses Cardiac: RRR; 1/6 systolic murmurs, rubs, or gallops,no edema  Respiratory:  Clear to auscultation bilaterally, normal work of breathing GI: soft, nontender, nondistended, + BS MS: no deformity or atrophy Skin: warm and dry, no rash Neuro:  Strength and sensation are intact Psych: euthymic mood, full affect   EKG: Not completed this office visit  Recent Labs: 09/17/2019: ALT 11 09/22/2019: Magnesium 1.9 09/27/2019: Hemoglobin 11.7; Platelets 150 01/09/2020: NT-Pro BNP 1,018 01/29/2020: BUN 31; Creatinine, Ser 1.24; Potassium 4.3; Sodium 142    Lipid Panel No results found for: CHOL, TRIG, HDL, CHOLHDL, VLDL, LDLCALC, LDLDIRECT    Wt Readings from Last 3 Encounters:  02/04/20 146 lb 8 oz (66.5 kg)  01/22/20 147 lb (66.7 kg)  01/09/20 149 lb 3.2 oz (67.7 kg)      Other studies Reviewed: Limited Echo 01/15/2020 1. Left ventricular ejection fraction, by estimation, is 60 to 65%. The  left ventricle has normal function. The left ventricle has no regional  wall motion abnormalities. Left ventricular diastolic parameters are  consistent with Grade I diastolic  dysfunction (impaired relaxation). The average left ventricular global  longitudinal strain is -15.2 %. The global longitudinal strain is normal.  2. Right ventricular systolic function is mildly reduced. The right  ventricular size is normal. There is normal pulmonary artery systolic  pressure. The estimated right ventricular systolic pressure is 15.4 mmHg.  3. Right atrial size was mildly dilated.  4. The mitral valve is normal in structure. Trivial mitral valve  regurgitation. No evidence of mitral stenosis.  5. Tricuspid valve regurgitation is moderate.  6. The aortic valve is normal in structure. Aortic valve  regurgitation is  not visualized. Mild to moderate aortic valve sclerosis/calcification is  present, without any evidence of aortic stenosis.  7. Aortic dilatation noted. There is mild dilatation of the ascending  aorta measuring 37 mm.  8. The inferior vena cava is normal in size with greater than 50%  respiratory variability, suggesting right atrial pressure of 3 mmHg.     ASSESSMENT AND PLAN:  1.  AAA: Status post repair by Dr. Carlis Abbott on 09/22/2019.  The patient states he has not regained his strength since procedure.  He states he actually feels weaker.  However he continues to walk with a walker and has graduated to not using it in his home.  He did undergo physical therapy but they were no longer coming.  I have encouraged him to do his best to gain his strength and be active.  He is to continue to use a walker when he is outside or in an area where it is likely that he will fall without support.  He verbalizes understanding  2.  Chronic diastolic heart failure: When last seen in the office his Lasix was increased to 80 mg daily with potassium supplementation of 20 mEq daily.  He has done much better with increased dose with follow-up labs revealing normal creatinine and potassium levels.  He states he is feeling much better and is wearing support hose.  We will continue current dose.  He will follow-up with Dr. Debara Pickett in 3 months.  3.  Hypertension: Elevated on first arriving.  I have rechecked his blood pressure in the office exam room found to be 146/72.  Will not make any changes at this time.  He is advised on low-sodium diet.  4.  Hyperlipidemia: Continue atorvastatin 20 mg daily.  He will need follow-up fasting lipids and LFTs in 6 months unless completed by PCP.    Current medicines are reviewed at length with the patient today.  I have spent 25 minutes dedicated to the care of this patient on the date of this encounter to include pre-visit review of records, assessment, management  and diagnostic testing,with shared decision making.  Labs/ tests ordered today include:  Phill Myron. West Pugh, ANP, AACC   02/04/2020 [facility] [place] [address] Office [phone] Fax [phone]  Notice: This dictation was prepared with Dragon dictation along with smaller phrase technology. Any transcriptional errors that result from this process are unintentional and may not be corrected upon review.

## 2020-02-04 ENCOUNTER — Other Ambulatory Visit: Payer: Self-pay

## 2020-02-04 ENCOUNTER — Encounter: Payer: Self-pay | Admitting: Adult Health

## 2020-02-04 ENCOUNTER — Ambulatory Visit: Payer: Medicare Other | Admitting: Adult Health

## 2020-02-04 VITALS — BP 152/90 | HR 74 | Wt 146.5 lb

## 2020-02-04 DIAGNOSIS — I10 Essential (primary) hypertension: Secondary | ICD-10-CM

## 2020-02-04 DIAGNOSIS — Z79899 Other long term (current) drug therapy: Secondary | ICD-10-CM

## 2020-02-04 DIAGNOSIS — I5032 Chronic diastolic (congestive) heart failure: Secondary | ICD-10-CM | POA: Diagnosis not present

## 2020-02-04 DIAGNOSIS — E7800 Pure hypercholesterolemia, unspecified: Secondary | ICD-10-CM | POA: Diagnosis not present

## 2020-02-04 DIAGNOSIS — Z8679 Personal history of other diseases of the circulatory system: Secondary | ICD-10-CM

## 2020-02-04 DIAGNOSIS — Z9889 Other specified postprocedural states: Secondary | ICD-10-CM

## 2020-02-04 NOTE — Patient Instructions (Signed)
Medication Instructions:  Continue current medications  *If you need a refill on your cardiac medications before your next appointment, please call your pharmacy*   Lab Work: BMP and CBC in 6 weeks   Testing/Procedures: None Ordered   Follow-Up: At [facility], you and your health needs are our priority.  As part of our continuing mission to provide you with exceptional heart care, we have created designated Provider Care Teams.  These Care Teams include your primary Cardiologist (physician) and Advanced Practice Providers (APPs -  Physician Assistants and Nurse Practitioners) who all work together to provide you with the care you need, when you need it.  We recommend signing up for the patient portal called "MyChart".  Sign up information is provided on this After Visit Summary.  MyChart is used to connect with patients for Virtual Visits (Telemedicine).  Patients are able to view lab/test results, encounter notes, upcoming appointments, etc.  Non-urgent messages can be sent to your provider as well.   To learn more about what you can do with MyChart, go to [email or web address].    Your next appointment:   6 week(s)  The format for your next appointment:   In Person  Provider:   Almyra Deforest, PA-C

## 2020-02-05 ENCOUNTER — Other Ambulatory Visit: Payer: Self-pay | Admitting: Thoracic Surgery (Cardiothoracic Vascular Surgery)

## 2020-02-05 DIAGNOSIS — I712 Thoracic aortic aneurysm, without rupture, unspecified: Secondary | ICD-10-CM

## 2020-02-06 NOTE — Progress Notes (Signed)
Wrong encounter selected

## 2020-02-26 IMAGING — CT CT ANGIOGRAPHY CHEST
2 of 8 series · 18 of 46 positions shown · IV contrast (OMNIPAQUE 350)
Comparison: 11/07/2017

CLINICAL DATA: Thoracic aortic aneurysm, follow-up

EXAM:
CT ANGIOGRAPHY CHEST WITH CONTRAST
TECHNIQUE: Multidetector CT imaging of the chest was performed using the
standard protocol during bolus administration of intravenous
contrast. Multiplanar CT image reconstructions and MIPs were
obtained to evaluate the vascular anatomy.
CONTRAST:  100mL OMNIPAQUE IOHEXOL 350 MG/ML SOLN

[Series 5: aorta 3.0 i31f 2 · axial · 0.73mm/px · z∈[-353,-56]mm · 15 of 109 slices shown]
[im 5/109  lung]
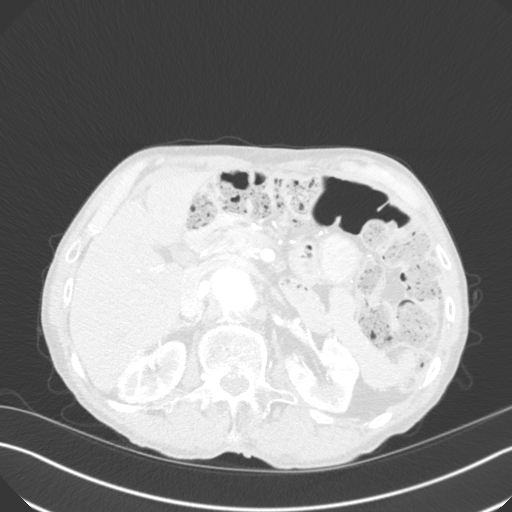
[im 13/109  soft-tissue]
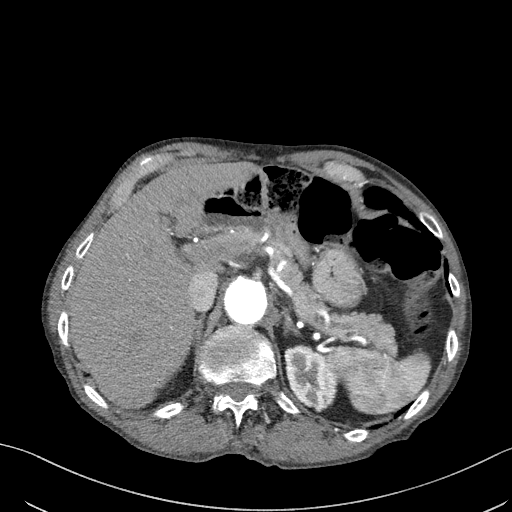
[im 21/109  lung]
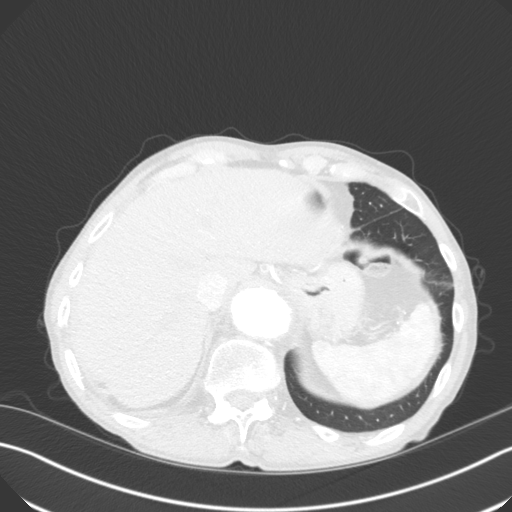
[im 25/109  soft-tissue]
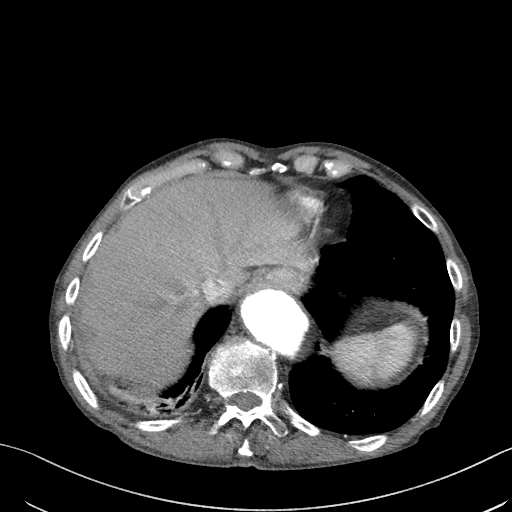
[im 34/109  lung]
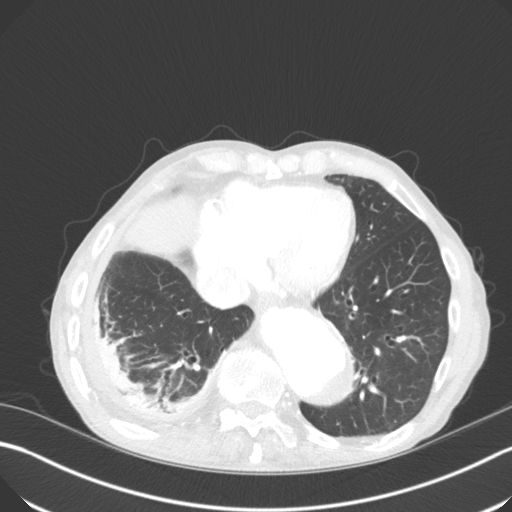
[im 42/109  soft-tissue]
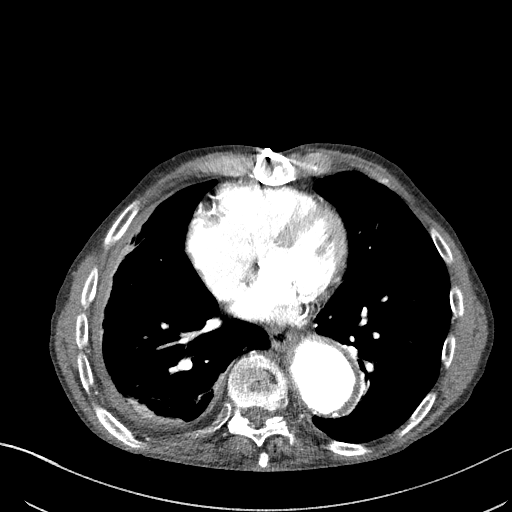
[im 46/109  lung]
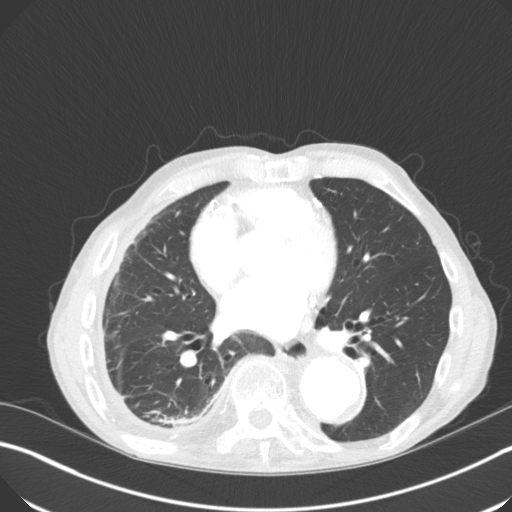
[im 55/109  soft-tissue]
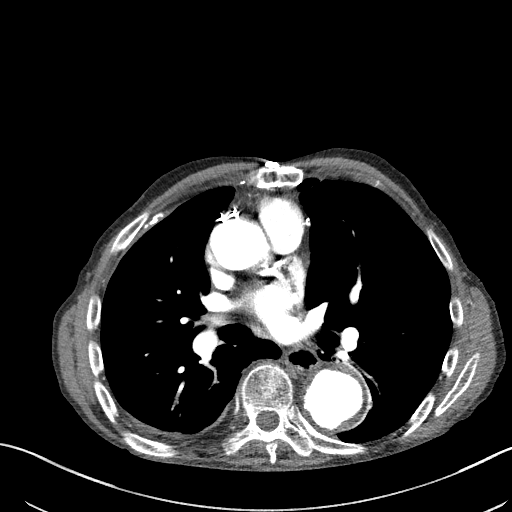
[im 63/109  lung]
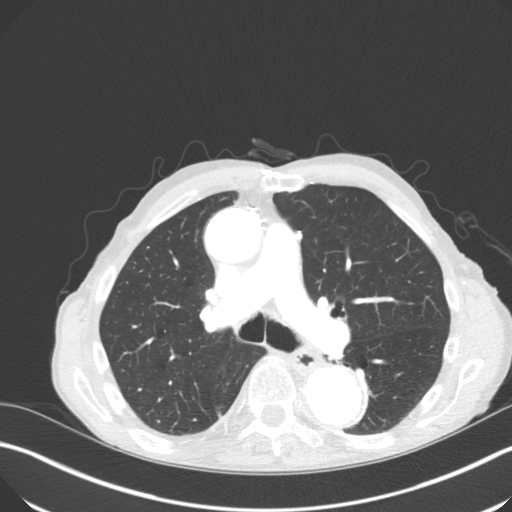
[im 67/109  soft-tissue]
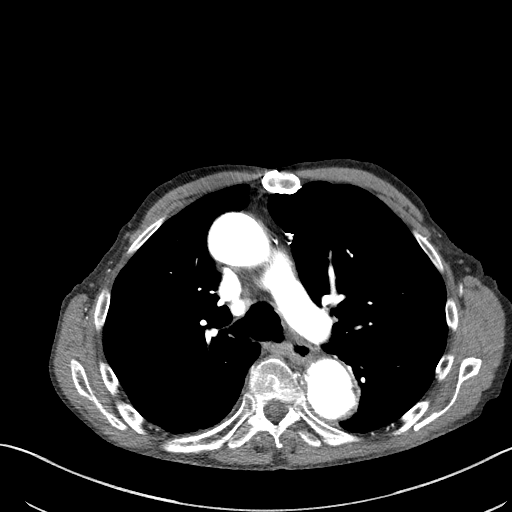
[im 75/109  lung]
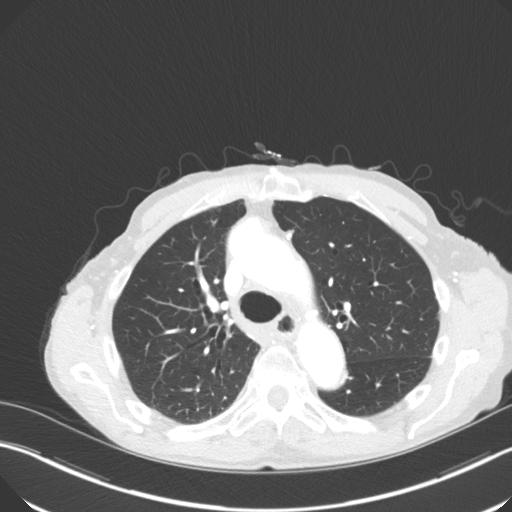
[im 84/109  soft-tissue]
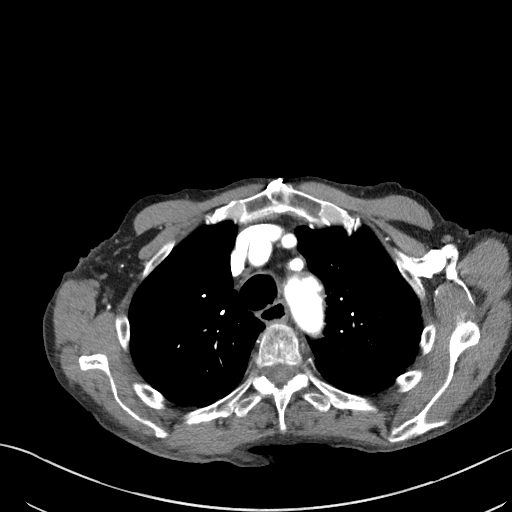
[im 88/109  lung]
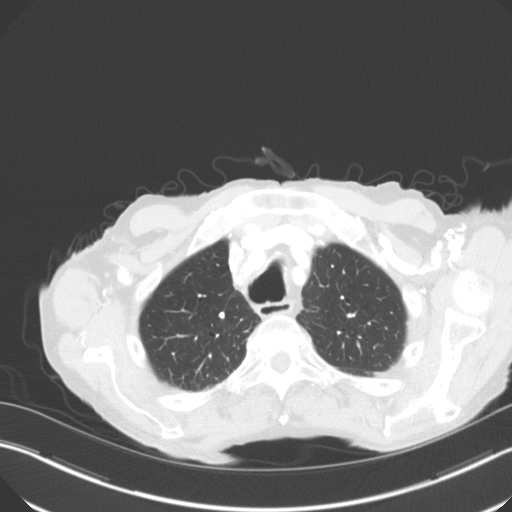
[im 96/109  soft-tissue]
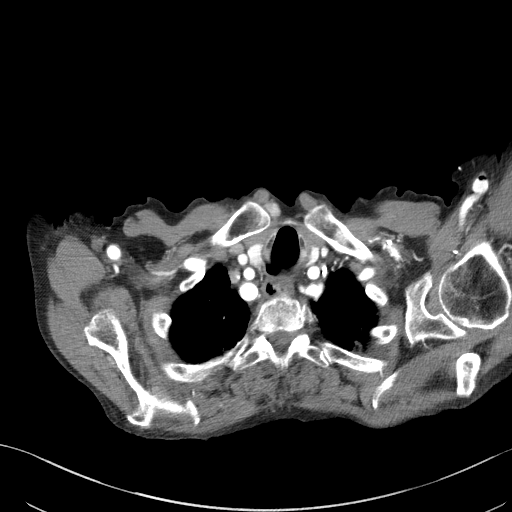
[im 104/109  lung]
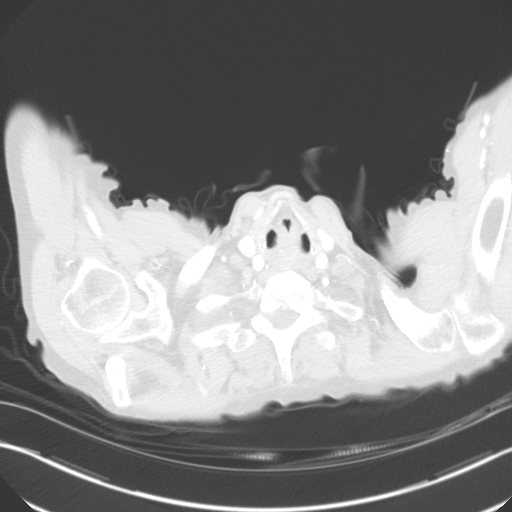

[Series 8: coronals · coronal · 0.62mm/px · 3 of 122 slices shown]
[im 31/122  soft-tissue]
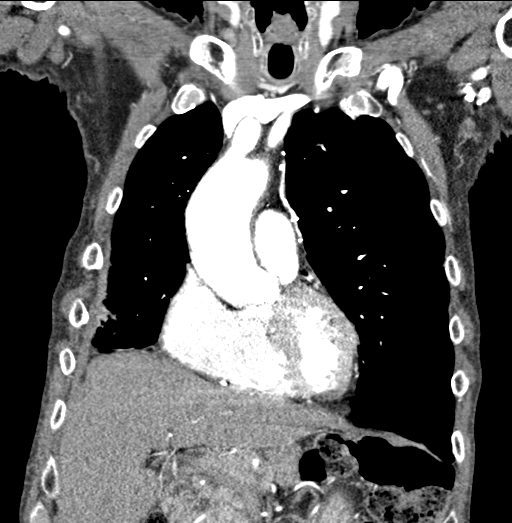
[im 61/122  soft-tissue]
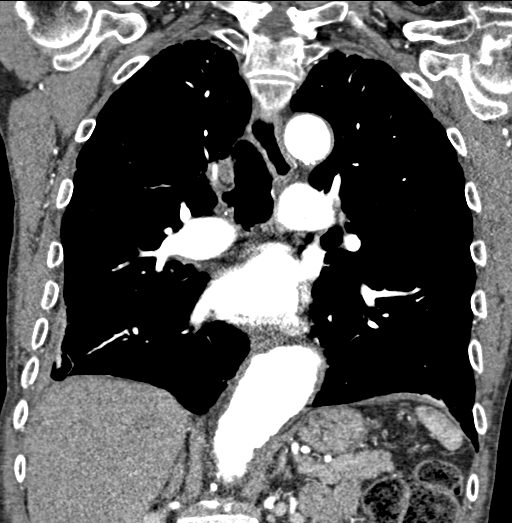
[im 91/122  soft-tissue]
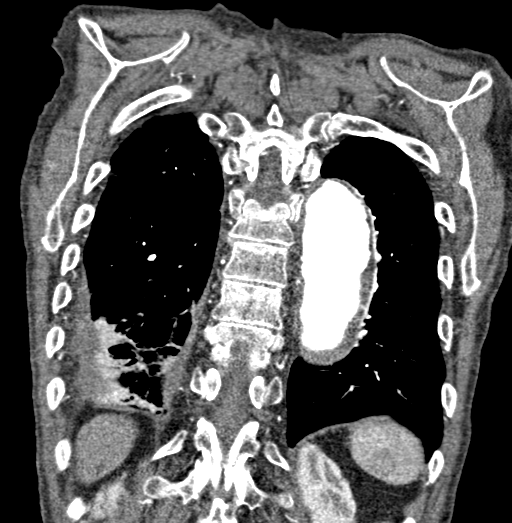

[18 of 46 positions shown; findings below may reference images not displayed]

FINDINGS: Cardiovascular: Heart size normal. No pericardial effusion.
Satisfactory opacification of pulmonary arteries noted, and there is
no evidence of pulmonary emboli. Common left pulmonary vein trunk to
the left atrium. Extensive coronary calcifications. Previous CABG.
Good contrast opacification of the thoracic aorta with maximum
transverse dimensions as follows:

4.1 cm sinuses of Valsalva

3.4 cm Simbeck junction

4.1  Cm proximal ascending (previously 4.2)

3.6 cm distal ascending/proximal arch

3.3 cm distal arch

2.9 cm proximal descending

5.3 cm mid descending (previously 4.8 cm by my measurement)

4.4 cm distal descending

No dissection or stenosis. Classic 3 vessel brachiocephalic arterial
origin anatomy. Eccentric nonocclusive mural thrombus in the dilated
mid and distal descending segments.

The proximal visualized abdominal aorta is dilated measuring up to
4.2 cm diameter at the level of the left renal artery, not
visualized distally.

Mediastinum/Nodes: No hilar or mediastinal adenopathy.

Lungs/Pleura: Trace right pleural effusion as before. No
pneumothorax. Right lower lobe bronchiectasis. Subpleural
atelectasis/scarring/consolidation laterally and posteriorly at the
right lung base as before.

Musculoskeletal: Mild T8 and T9 vertebral body compression
deformities, new since previous.

Upper Abdomen: No acute findings.

Review of the MIP images confirms the above findings.
IMPRESSION: 1. Stable 4.2 cm ascending thoracic aortic aneurysm.
2. Enlarging 5.3 cm mid descending thoracic aortic aneurysm
(previously 4.8). Recommend semi-annual imaging followup by CTA or
MRA and referral to cardiothoracic surgery if not already obtained.
This recommendation follows 5363
ACCF/AHA/AATS/ACR/ASA/SCA/JOSHJAX/RUDI/HORCKY/ABDALAZIZ Guidelines for the
Diagnosis and Management of Patients With Thoracic Aortic Disease.
Circulation. 5363; 121: [id number]
3. Abdominal aortic aneurysm at least 4.4 cm, incompletely
visualized.
4. Abdominal aortic aneurysm measuring at least 4.2 cm diameter,
incompletely visualized distally.
5. New mild T8 and T9 vertebral body compression deformities.

## 2020-02-29 ENCOUNTER — Other Ambulatory Visit: Payer: Self-pay | Admitting: Internal Medicine

## 2020-03-04 ENCOUNTER — Other Ambulatory Visit: Payer: Medicare Other

## 2020-03-09 ENCOUNTER — Ambulatory Visit: Payer: Medicare Other | Admitting: Thoracic Surgery (Cardiothoracic Vascular Surgery)

## 2020-03-13 ENCOUNTER — Other Ambulatory Visit: Payer: Self-pay | Admitting: Internal Medicine

## 2020-03-18 ENCOUNTER — Encounter: Payer: Self-pay | Admitting: Physician Assistant

## 2020-03-18 ENCOUNTER — Ambulatory Visit: Payer: Medicare Other | Admitting: Physician Assistant

## 2020-03-18 ENCOUNTER — Other Ambulatory Visit: Payer: Self-pay

## 2020-03-18 VITALS — BP 122/82 | HR 64 | Ht 67.0 in | Wt 146.4 lb

## 2020-03-18 DIAGNOSIS — E785 Hyperlipidemia, unspecified: Secondary | ICD-10-CM | POA: Diagnosis not present

## 2020-03-18 DIAGNOSIS — I459 Conduction disorder, unspecified: Secondary | ICD-10-CM | POA: Diagnosis not present

## 2020-03-18 DIAGNOSIS — I10 Essential (primary) hypertension: Secondary | ICD-10-CM

## 2020-03-18 DIAGNOSIS — I25810 Atherosclerosis of coronary artery bypass graft(s) without angina pectoris: Secondary | ICD-10-CM

## 2020-03-18 DIAGNOSIS — I714 Abdominal aortic aneurysm, without rupture, unspecified: Secondary | ICD-10-CM

## 2020-03-18 MED ORDER — DILTIAZEM HCL ER 120 MG PO CP24: 120.0000 mg | ORAL_CAPSULE | Freq: Every day | ORAL | 3 refills | Status: DC

## 2020-03-18 NOTE — Patient Instructions (Addendum)
Medication Instructions:  Please decrease Diltiazem to 120 mg tablet daily   *If you need a refill on your cardiac medications before your next appointment, please call your pharmacy*   Lab Work: BMET- today in office.   If you have labs (blood work) drawn today and your tests are completely normal, you will receive your results only by: Marland Kitchen MyChart Message (if you have MyChart) OR . A paper copy in the mail If you have any lab test that is abnormal or we need to change your treatment, we will call you to review the results.   Testing/Procedures: None Ordered At This Time.     Follow-Up: At [facility], you and your health needs are our priority.  As part of our continuing mission to provide you with exceptional heart care, we have created designated Provider Care Teams.  These Care Teams include your primary Cardiologist (physician) and Advanced Practice Providers (APPs -  Physician Assistants and Nurse Practitioners) who all work together to provide you with the care you need, when you need it.   Your next appointment:   4 month(s)  The format for your next appointment:   In Person  Provider:   K. Mali Hilty, MD

## 2020-03-18 NOTE — Progress Notes (Signed)
Cardiology Office Note:    Date:  03/20/2020   ID:  Andrew [name], DOB 06/23/1932, MRN [id number]  PCP:  Andrew Cruel, MD  [place] Cardiologist:  Andrew Casino, MD  [place] Electrophysiologist:  None   Referring MD: Andrew Cruel, MD   Chief Complaint  Patient presents with  . Follow-up    seen for Dr. Debara [name]    History of Present Illness:    Andrew [name] is a 86 y.o. male with a hx of CAD s/p CABG 2010, HTN,thoracic aortic aneurysm,frequent PVCs, CKD and hyperlipidemia. Patient had previous angioplasty and PCI prior to the bypass surgery in 2010. Although there was a listed history of paroxysmal atrial fibrillation, however there was no record or documentation to support that based on the previous cardiology note. He wore a heart monitor in 2017 that showed PACs and PVCs. He was previously followed by Dr. Wynonia [name] however later establish care with Dr. Debara [name] in March 2020.Due to increased palpitation, a heart monitor was repeated on 02/17/2019, this showed sinus rhythm with frequent PVCs up to 11.5%, and periods of bigeminy. He was asymptomatic, it was decided to continue monitoring.Due to the enlarging thoracic aortic aneurysm, he was referred to cardiothoracic surgery. In addition, he was later referred to Andrew [name] of vascular surgery to follow-up on the abdominal aortic aneurysm. Patient underwent endovascular repair of the descending thoracic aortic aneurysm without coverage of the left subclavian artery by Andrew [name] using 40 mm x 20 mm Gore Tag x2 on 09/22/2019. Echocardiogram performed on 09/27/2019 showed EF 60 to 65%, mildly elevated pulmonary artery systolic pressure, otherwise no significant valve issue. Myoview performed on 09/27/2019 showed a large defect of moderate severity present in the basal inferoseptal, basal inferior, basal inferolateral, mid inferoseptal, mid inferior and mid inferolateral location that actually improved with stress, this is  not consistent with reversible ischemia, TIDwas elevated to 2.09, EF 55 to 65%.  More recently, I saw the patient in June for volume overload.  I increase his Lasix to 40 mg daily and added potassium supplement.  proBNP was consistent with volume overload as well.  AAA Doppler obtained on 01/06/2020 showed largest diameter for abdominal aorta was 5.3 x 4.8 cm, this was being followed by Andrew [name] of vascular surgery.  Limited echocardiogram obtained on 01/15/2020 showed EF 60 to 65%, grade 1 DD, mild dilatation of the ascending aorta measuring at 37 mm.  When I saw the patient back in July, he was still volume overloaded, therefore increase his Lasix to 80 mg daily and also potassium supplement to 20 mEq daily.  Patient presents today for cardiology office visit.  He denies any recent chest pain or shortness of breath.  He has no lower extremity edema anymore.  Recently, he has been noticing increasing skipped heartbeat.  EKG strip today shows he has intermittent Wenckbach heart block.  I recommended decreasing diltiazem to 120 mg daily.  Otherwise he has no dizziness, blurry vision or feeling of passing out.  I will obtain a basic metabolic panel today, if renal function worsens, may need to decrease on the Lasix dosage.  He can follow-up with Dr. Debara [name] in 4 months.    Past Medical History:  Diagnosis Date  . AAA (abdominal aortic aneurysm) ([place])   . Angioedema 06/01/2015  . Arthritis   . Back pain   . 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 Chronic kidney disease    ?  bph, pt self catheterizes himself on schedule  . Complication of anesthesia    had to have Foley post op cabg and lumb lam  . Coronary artery disease    s/p remote PCI // s/p CABG in 2010 Echo 01/2019: EF 60-65, Gr 1 DD, normal RVSF, RVSP 36, mild LAE, mild to mod TR  . Hypertension   . PVC's (premature ventricular contractions)    Monitor 12/2018:  Sinus rhythm with frequent PVC's  (11.5%) and periods of bigeminy.   . Thoracic aortic aneurysm ([place])    09/29/16 CT: 4.3 cm ascending, 4.5 cm descending. 1 yr f/u rec // s/p endovascular repair 09/2019 (Andrew [name])  . Urinary retention   . Wears glasses     Past Surgical History:  Procedure Laterality Date  . BACK SURGERY  2012   lumb lam  . CARDIAC CATHETERIZATION     2010-per patient  . COLONOSCOPY    . CORONARY ARTERY BYPASS GRAFT  2010   triple  . EPIGASTRIC HERNIA REPAIR N/A 09/11/2017   Procedure: HERNIA REPAIR EPIGASTRIC ADULT;  Surgeon: Andrew Bookbinder, MD;  Location: [facility];  Service: General;  Laterality: N/A;  . HERNIA REPAIR     rih 1981  . HERNIA REPAIR  09/11/2017   with mesh  . INGUINAL HERNIA REPAIR  07/30/2012   Procedure: HERNIA REPAIR INGUINAL ADULT;  Surgeon: Andrew Bookbinder, MD;  Location: [place] [facility];  Service: General;  Laterality: Left;  Left Hernia Site  . INSERTION OF MESH  07/30/2012   Procedure: INSERTION OF MESH;  Surgeon: Andrew Bookbinder, MD;  Location: [facility];  Service: General;  Laterality: Left;  Left Inguinal Hernia  . INSERTION OF MESH N/A 09/11/2017   Procedure: INSERTION OF MESH;  Surgeon: Andrew Bookbinder, MD;  Location: [facility];  Service: General;  Laterality: N/A;  . LAPAROSCOPY N/A 09/11/2017   Procedure: DIAGNOSTIC LAPAOSCOPY;  Surgeon: Andrew Bookbinder, MD;  Location: [facility];  Service: General;  Laterality: N/A;  ERAS pathway  . SPINE SURGERY     lumbar laminectomy  . THORACIC AORTIC ENDOVASCULAR STENT GRAFT N/A 09/22/2019   Procedure: THORACIC AORTIC ENDOVASCULAR STENT GRAFT;  Surgeon: Andrew Heck, MD;  Location[facility];  Service: Vascular;  Laterality: N/A;  . TONSILLECTOMY      Current Medications: No outpatient medications have been marked as taking for the 03/18/20 encounter (Office Visit) with Andrew [name], Andrew [name].     Allergies:   Quinolones   Social History   Socioeconomic History  . Marital status: Married    Spouse name:  Andrew [name]  . Number of children: Not on file  . Years of education: Not on file  . Highest education level: Not on file  Occupational History    Comment: retired  Tobacco Use  . Smoking status: Former Smoker    Packs/day: 1.00    Years: 8.00    Pack years: 8.00    Quit date: 07/24/1984    Years since quitting: 35.6  . Smokeless tobacco: Never Used  Vaping Use  . Vaping Use: Never used  Substance and Sexual Activity  . Alcohol use: No  . Drug use: No  . Sexual activity: Not on file  Other Topics Concern  . Not on file  Social History Narrative   12/08/19 lives with wife   Social Determinants of Health   Financial Resource Strain:   . Difficulty of Paying Living Expenses: Not on file  Food Insecurity:   . Worried About Running  Out of Food in the Last Year: Not on file  . Ran Out of Food in the Last Year: Not on file  Transportation Needs:   . Lack of Transportation (Medical): Not on file  . Lack of Transportation (Non-Medical): Not on file  Physical Activity:   . Days of Exercise per Week: Not on file  . Minutes of Exercise per Session: Not on file  Stress:   . Feeling of Stress : Not on file  Social Connections:   . Frequency of Communication with Friends and Family: Not on file  . Frequency of Social Gatherings with Friends and Family: Not on file  . Attends Religious Services: Not on file  . Active Member of Clubs or Organizations: Not on file  . Attends Archivist Meetings: Not on file  . Marital Status: Not on file     Family History: The patient's family history includes Allergic rhinitis in his son; Coronary artery disease in his mother. There is no history of Angioedema, Asthma, Atopy, Eczema, Immunodeficiency, or Urticaria.  ROS:   Please see the history of present illness.     All other systems reviewed and are negative.  EKGs/Labs/Other Studies Reviewed:    The following studies were reviewed today:  Echo 01/15/2020 1. Left ventricular ejection  fraction, by estimation, is 60 to 65%. The  left ventricle has normal function. The left ventricle has no regional  wall motion abnormalities. Left ventricular diastolic parameters are  consistent with Grade I diastolic  dysfunction (impaired relaxation). The average left ventricular global  longitudinal strain is -15.2 %. The global longitudinal strain is normal.  2. Right ventricular systolic function is mildly reduced. The right  ventricular size is normal. There is normal pulmonary artery systolic  pressure. The estimated right ventricular systolic pressure is 58.5 mmHg.  3. Right atrial size was mildly dilated.  4. The mitral valve is normal in structure. Trivial mitral valve  regurgitation. No evidence of mitral stenosis.  5. Tricuspid valve regurgitation is moderate.  6. The aortic valve is normal in structure. Aortic valve regurgitation is  not visualized. Mild to moderate aortic valve sclerosis/calcification is  present, without any evidence of aortic stenosis.  7. Aortic dilatation noted. There is mild dilatation of the ascending  aorta measuring 37 mm.  8. The inferior vena cava is normal in size with greater than 50%  respiratory variability, suggesting right atrial pressure of 3 mmHg.   EKG:  EKG is ordered today.  The ekg ordered today demonstrates sinus rhythm with intermittent Wenckebach heart block.  Recent Labs: 09/17/2019: ALT 11 09/22/2019: Magnesium 1.9 09/27/2019: Hemoglobin 11.7; Platelets 150 01/09/2020: NT-Pro BNP 1,018 03/18/2020: BUN 44; Creatinine, Ser 1.09; Potassium 4.7; Sodium 139  Recent Lipid Panel No results found for: CHOL, TRIG, HDL, CHOLHDL, VLDL, LDLCALC, LDLDIRECT  Physical Exam:    VS:  BP 122/82   Pulse 64   Ht 5\' 7"  (1.702 m)   Wt 146 lb 6.4 oz (66.4 kg)   BMI 22.93 kg/m     Wt Readings from Last 3 Encounters:  03/18/20 146 lb 6.4 oz (66.4 kg)  02/04/20 146 lb 8 oz (66.5 kg)  01/22/20 147 lb (66.7 kg)     GEN:  Well nourished,  well developed in no acute distress HEENT: Normal NECK: No JVD; No carotid bruits LYMPHATICS: No lymphadenopathy CARDIAC: RRR, no murmurs, rubs, gallops RESPIRATORY:  Clear to auscultation without rales, wheezing or rhonchi  ABDOMEN: Soft, non-tender, non-distended MUSCULOSKELETAL:  No edema;  No deformity  SKIN: Warm and dry NEUROLOGIC:  Alert and oriented x 3 PSYCHIATRIC:  Normal affect   ASSESSMENT:    1. Skipped heart beats   2. Essential hypertension   3. Coronary artery disease involving coronary bypass graft of native heart without angina pectoris   4. Hyperlipidemia LDL goal <70   5. AAA (abdominal aortic aneurysm) without rupture (HCC)    PLAN:    In order of problems listed above:  1. Skipped heartbeat: EKG today showed Wenckebach heart block.  I recommended reduce diltiazem to 120 mg daily.  He will need a basic metabolic panel to make sure there is no electrolyte imbalance  2. CAD s/p CABG: Denies any recent chest pain.  On aspirin and statin  3. Hypertension: Blood pressure stable on current therapy  4. Hyperlipidemia: Continue statin therapy  5. AAA: Status post repair.  Followed by Andrew [name].   Medication Adjustments/Labs and Tests Ordered: Current medicines are reviewed at length with the patient today.  Concerns regarding medicines are outlined above.  Orders Placed This Encounter  Procedures  . Basic metabolic panel   Meds ordered this encounter  Medications  . diltiazem (DILACOR XR) 120 MG 24 hr capsule    Sig: Take 1 capsule (120 mg total) by mouth daily.    Dispense:  30 capsule    Refill:  3    Patient Instructions  Medication Instructions:  Please decrease Diltiazem to 120 mg tablet daily   *If you need a refill on your cardiac medications before your next appointment, please call your pharmacy*   Lab Work: BMET- today in office.   If you have labs (blood work) drawn today and your tests are completely normal, you will receive your  results only by: Marland Kitchen MyChart Message (if you have MyChart) OR . A paper copy in the mail If you have any lab test that is abnormal or we need to change your treatment, we will call you to review the results.   Testing/Procedures: None Ordered At This Time.     Follow-Up: At [facility], you and your health needs are our priority.  As part of our continuing mission to provide you with exceptional heart care, we have created designated Provider Care Teams.  These Care Teams include your primary Cardiologist (physician) and Advanced Practice Providers (APPs -  Physician Assistants and Nurse Practitioners) who all work together to provide you with the care you need, when you need it.   Your next appointment:   4 month(s)  The format for your next appointment:   In Person  Provider:   K. Mali Hilty, MD       Signed, Andrew [name], [place]  03/20/2020 11:32 PM    [place]

## 2020-03-19 ENCOUNTER — Other Ambulatory Visit (INDEPENDENT_AMBULATORY_CARE_PROVIDER_SITE_OTHER): Payer: Medicare Other

## 2020-03-19 DIAGNOSIS — Z9889 Other specified postprocedural states: Secondary | ICD-10-CM

## 2020-03-19 DIAGNOSIS — Z8679 Personal history of other diseases of the circulatory system: Secondary | ICD-10-CM

## 2020-03-19 DIAGNOSIS — I5032 Chronic diastolic (congestive) heart failure: Secondary | ICD-10-CM | POA: Diagnosis not present

## 2020-03-19 DIAGNOSIS — Z79899 Other long term (current) drug therapy: Secondary | ICD-10-CM | POA: Diagnosis not present

## 2020-03-19 LAB — BASIC METABOLIC PANEL
BUN/Creatinine Ratio: 40 — ABNORMAL HIGH (ref 10–24)
BUN: 44 mg/dL — ABNORMAL HIGH (ref 8–27)
CO2: 28 mmol/L (ref 20–29)
Calcium: 9.4 mg/dL (ref 8.6–10.2)
Chloride: 97 mmol/L (ref 96–106)
Creatinine, Ser: 1.09 mg/dL (ref 0.76–1.27)
GFR calc Af Amer: 71 mL/min/{1.73_m2} (ref >59)
GFR calc non Af Amer: 61 mL/min/{1.73_m2} (ref >59)
Glucose: 97 mg/dL (ref 65–99)
Potassium: 4.7 mmol/L (ref 3.5–5.2)
Sodium: 139 mmol/L (ref 134–144)

## 2020-03-20 ENCOUNTER — Encounter: Payer: Self-pay | Admitting: Physician Assistant

## 2020-04-20 ENCOUNTER — Telehealth: Payer: Self-pay | Admitting: Internal Medicine

## 2020-04-20 NOTE — Telephone Encounter (Signed)
Reports checking heart rate with pulse oximeter and was 44 & 48. Advised that pulse oximeter doesn't always give accurate readings. Reports seeing PCP last week and BP & HR was normal BP checked with BP monitor today showed  BP118/63 & HR 63  Denies fatigue, lightheadedness or dizziness, denies chest pain, sob or palpitations.  Advised to continue monitoring BP and HR with BP monitor. Advised if he develops symptoms that are mild, to contact our office and if severe, to go to the ED for an evaluation. Verbalized understanding of plan.

## 2020-04-20 NOTE — Telephone Encounter (Signed)
   Patient c/o Palpitations:  High priority if patient c/o lightheadedness, shortness of breath, or chest pain  1) How long have you had palpitations/irregular HR/ Afib? Irregular HR  Are you having the symptoms now? yes  2) Are you currently experiencing lightheadedness, SOB or CP? no  3) Do you have a history of afib (atrial fibrillation) or irregular heart rhythm? yes  4) Have you checked your BP or HR? (document readings if available): 48, 44  5) Are you experiencing any other symptoms? Doesn't have much energy

## 2020-06-09 ENCOUNTER — Other Ambulatory Visit: Payer: Self-pay | Admitting: *Deleted

## 2020-06-09 DIAGNOSIS — I712 Thoracic aortic aneurysm, without rupture, unspecified: Secondary | ICD-10-CM

## 2020-07-01 ENCOUNTER — Ambulatory Visit
Admission: RE | Admit: 2020-07-01 | Discharge: 2020-07-01 | Disposition: A | Discharge status: Home / Self Care | Payer: Medicare Other | Source: Ambulatory Visit | Attending: Vascular Surgery | Admitting: Vascular Surgery

## 2020-07-01 DIAGNOSIS — I712 Thoracic aortic aneurysm, without rupture, unspecified: Secondary | ICD-10-CM

## 2020-07-01 MED ORDER — IOPAMIDOL (ISOVUE-370) INJECTION 76%
75.0000 mL | Freq: Once | INTRAVENOUS | Status: AC | PRN
  Administered 2020-07-01: 75 mL via INTRAVENOUS

## 2020-07-06 ENCOUNTER — Encounter: Payer: Self-pay | Admitting: Vascular Surgery

## 2020-07-06 ENCOUNTER — Other Ambulatory Visit: Payer: Self-pay

## 2020-07-06 ENCOUNTER — Ambulatory Visit: Payer: Medicare Other | Admitting: Vascular Surgery

## 2020-07-06 VITALS — BP 130/81 | HR 67 | Temp 98.0°F | Resp 16 | Ht 68.0 in | Wt 140.0 lb

## 2020-07-06 DIAGNOSIS — I712 Thoracic aortic aneurysm, without rupture, unspecified: Secondary | ICD-10-CM

## 2020-07-06 DIAGNOSIS — I714 Abdominal aortic aneurysm, without rupture, unspecified: Secondary | ICD-10-CM

## 2020-07-06 NOTE — Progress Notes (Signed)
Patient name: Andrew [name] MRN: [id number] DOB: 11-01-1931 Sex: male  REASON FOR VISIT: 6 month follow-up s/p endovascular repair of descending thoracic aneurysm, surveillance of AAA  HPI: Andrew [name] is a 86 y.o. male who presents for 21-month follow-up for ongoing surveillance of a thoracic aneurysm that has been repaired with stent graft and also surveillance of a 4.8 cm abdominal aortic aneurysm.  Ultimately he reports no significant issues over the last 6 months.  He has had ongoing issues with fatigue since his thoracic stent graft that was done via transfemoral access.  He still goes to the [facility] about 3 times a week but his wife has to drive him.  He is walking with a walker.  Was previously sent to neurology for his lower extremity fatigue and weakness and had thoracic MRIs that were normal.His thoracic stent graft was placed on 09/22/2019 for 5.9 cm descending thoracic aneurysm.  We have also been following an abdominal aortic aneurysm that last measured about 4.8 cm on a CT in January 2021.    Past Medical History:  Diagnosis Date   AAA (abdominal aortic aneurysm) ([place])    Angioedema 06/01/2015   Arthritis    Back pain    Cancer (HCC)    squamous cell skin cancer carcinomas removed from hand and face   Chronic kidney disease    ? bph, pt self catheterizes himself on schedule   Complication of anesthesia    had to have Foley post op cabg and lumb lam   Coronary artery disease    s/p remote PCI // s/p CABG in 2010 Echo 01/2019: EF 60-65, Gr 1 DD, normal RVSF, RVSP 36, mild LAE, mild to mod TR   Hypertension    PVC's (premature ventricular contractions)    Monitor 12/2018:  Sinus rhythm with frequent PVC's (11.5%) and periods of bigeminy.    Thoracic aortic aneurysm ([place])    09/29/16 CT: 4.3 cm ascending, 4.5 cm descending. 1 yr f/u rec // s/p endovascular repair 09/2019 (Dr. Carlis Abbott)   Urinary retention    Wears glasses     Past Surgical History:  Procedure  Laterality Date   BACK SURGERY  2012   lumb lam   CARDIAC CATHETERIZATION     2010-per patient   COLONOSCOPY     CORONARY ARTERY BYPASS GRAFT  2010   triple   EPIGASTRIC HERNIA REPAIR N/A 09/11/2017   Procedure: HERNIA REPAIR EPIGASTRIC ADULT;  Surgeon: Rolm Bookbinder, MD;  Location: [place];  Service: General;  Laterality: N/A;   HERNIA REPAIR     rih [place]  09/11/2017   with mesh   INGUINAL HERNIA REPAIR  07/30/2012   Procedure: HERNIA REPAIR INGUINAL ADULT;  Surgeon: Rolm Bookbinder, MD;  Location: [place];  Service: General;  Laterality: Left;  Left Hernia Site   INSERTION OF MESH  07/30/2012   Procedure: INSERTION OF MESH;  Surgeon: Rolm Bookbinder, MD;  Location: [place];  Service: General;  Laterality: Left;  Left Inguinal Hernia   INSERTION OF MESH N/A 09/11/2017   Procedure: INSERTION OF MESH;  Surgeon: Rolm Bookbinder, MD;  Location: [place];  Service: General;  Laterality: N/A;   LAPAROSCOPY N/A 09/11/2017   Procedure: DIAGNOSTIC LAPAOSCOPY;  Surgeon: Rolm Bookbinder, MD;  Location: [place];  Service: General;  Laterality: N/A;  ERAS pathway   SPINE SURGERY     lumbar laminectomy   THORACIC AORTIC ENDOVASCULAR STENT GRAFT N/A 09/22/2019  Procedure: THORACIC AORTIC ENDOVASCULAR STENT GRAFT;  Surgeon: Marty Heck, MD;  Location: [facility] OR;  Service: Vascular;  Laterality: N/A;   TONSILLECTOMY      Family History  Problem Relation Age of Onset   Allergic rhinitis Son    Coronary artery disease Mother    Angioedema Neg Hx    Asthma Neg Hx    Atopy Neg Hx    Eczema Neg Hx    Immunodeficiency Neg Hx    Urticaria Neg Hx     SOCIAL HISTORY: Social History   Tobacco Use   Smoking status: Former Smoker    Packs/day: 1.00    Years: 8.00    Pack years: 8.00    Quit date: 07/24/1984    Years since quitting: 35.9   Smokeless tobacco: Never Used  Substance Use Topics   Alcohol use: No     Allergies  Allergen Reactions   Quinolones Other (See Comments)    Patient has an abdominal aortic aneurysm     Current Outpatient Medications  Medication Sig Dispense Refill   aspirin 81 MG tablet Take 81 mg by mouth daily after supper.      atorvastatin (LIPITOR) 20 MG tablet TAKE 1 TABLET ONCE DAILY AT 6 PM. 90 tablet 1   Cholecalciferol (VITAMIN D3) 1000 units CAPS Take 1,000 Units by mouth daily.     Co-Enzyme Q-10 100 MG CAPS Take 100 mg by mouth daily.      CRANBERRY EXTRACT PO Take 1 tablet by mouth daily.     diltiazem (DILACOR XR) 120 MG 24 hr capsule Take 1 capsule (120 mg total) by mouth daily. 30 capsule 3   ferrous sulfate 325 (65 FE) MG tablet Take 325 mg by mouth daily with breakfast.     furosemide (LASIX) 80 MG tablet Take 1 tablet (80 mg total) by mouth daily. 90 tablet 3   gabapentin (NEURONTIN) 300 MG capsule Take 300 mg by mouth at bedtime.      ibuprofen (ADVIL) 200 MG tablet Take 200-400 mg by mouth every 6 (six) hours as needed (for pain).     loratadine (CLARITIN) 10 MG tablet Take 10 mg by mouth daily as needed for allergies or rhinitis.     Multiple Vitamins-Minerals (PRESERVISION AREDS 2 PO) Take 1 capsule by mouth 2 (two) times daily.     nitroGLYCERIN (NITRODUR - DOSED IN MG/24 HR) 0.2 mg/hr patch Use 1/2 patch daily to the affected area 30 patch 1   oxymetazoline (AFRIN) 0.05 % nasal spray Place 1 spray into both nostrils at bedtime as needed for congestion.     pantoprazole (PROTONIX) 40 MG tablet Take 40 mg by mouth daily.     potassium chloride 20 MEQ TBCR Take 20 mEq by mouth daily. 90 tablet 3   sodium chloride (MURO 128) 5 % ophthalmic ointment Place 1 application into both eyes at bedtime.     triamcinolone (NASACORT ALLERGY 24HR) 55 MCG/ACT AERO nasal inhaler Place 2 sprays into the nose daily as needed (for allergies).      TURMERIC PO Take 1 capsule by mouth daily after breakfast.     vitamin B-12 (CYANOCOBALAMIN) 1000  MCG tablet Take 1,000 mcg by mouth daily.      vitamin C (ASCORBIC ACID) 250 MG tablet Take 250 mg by mouth daily.      isosorbide mononitrate (IMDUR) 30 MG 24 hr tablet Take 0.5 tablets (15 mg total) by mouth daily. 15 tablet 0   nitroGLYCERIN (NITROSTAT) 0.6  MG SL tablet Place 0.6 mg under the tongue every 5 (five) minutes as needed for chest pain. (Patient not taking: No sig reported)     oxyCODONE-acetaminophen (PERCOCET) 5-325 MG tablet Take 1 tablet by mouth every 6 (six) hours as needed for severe pain. (Patient not taking: No sig reported) 15 tablet 0   No current facility-administered medications for this visit.    REVIEW OF SYSTEMS:  [X]  denotes positive finding, [ ]  denotes negative finding Cardiac  Comments:  Chest pain or chest pressure:    Shortness of breath upon exertion:    Short of breath when lying flat:    Irregular heart rhythm:        Vascular    Pain in calf, thigh, or hip brought on by ambulation:    Pain in feet at night that wakes you up from your sleep:     Blood clot in your veins:    Leg swelling:         Pulmonary    Oxygen at home:    Productive cough:     Wheezing:         Neurologic    Sudden weakness in arms or legs:     Sudden numbness in arms or legs:     Sudden onset of difficulty speaking or slurred speech:    Temporary loss of vision in one eye:     Problems with dizziness:         Gastrointestinal    Blood in stool:     Vomited blood:         Genitourinary    Burning when urinating:     Blood in urine:        Psychiatric    Major depression:         Hematologic    Bleeding problems:    Problems with blood clotting too easily:        Skin    Rashes or ulcers:        Constitutional    Fever or chills:      PHYSICAL EXAM: Vitals:   07/06/20 1418  BP: 130/81  Pulse: 67  Resp: 16  Temp: 98 F (36.7 C)  TempSrc: Temporal  SpO2: 98%  Weight: 140 lb (63.5 kg)  Height: 5\' 8"  (1.727 m)    GENERAL: The patient is  a well-nourished male, in no acute distress. The vital signs are documented above. CARDIAC: There is a regular rate and rhythm.  VASCULAR:  Palpable femoral pulses both groins Palpable DP pulses bilaterally ABDOMEN: no pain with palpation of aneurysm, soft, no rebound Extremities: Warm and well perfused with no wounds  DATA:   CTA chest 07/01/2020 shows good position of descending thoracic stent graft with exclusion of the aneurysm.  CTA abdomen 07/01/20: shows AAA with maximal diameter of 5 cm  Assessment/Plan:  86 year-old male status post endovascular repair of descending thoracic aneurysm with stent graft on 09/22/19 for a 5.9 cm thoracic aneurysm.  On follow-up today seems to be doing well.  Follow-up CTA chest shows good position of the stent graft with exclusion of the thoracic aneurysm.  In addition we are following abdominal aortic aneurysm that now measures 5 cm increased from 4.8 cm about 6 months ago.  Discussed that I will now follow his abdominal aneurysm with a AAA duplex here in the office in 6 months.  We discussed current guidelines are to repair at greater than 5.5 cm in men.  He has  no tenderness with palpation of his aneurysm.  Discussed he call with questions or concerns.   Marty Heck, MD [facility] Office: [phone]

## 2020-07-09 ENCOUNTER — Other Ambulatory Visit: Payer: Self-pay

## 2020-07-09 DIAGNOSIS — I712 Thoracic aortic aneurysm, without rupture, unspecified: Secondary | ICD-10-CM

## 2020-07-10 ENCOUNTER — Other Ambulatory Visit: Payer: Self-pay | Admitting: Physician Assistant

## 2020-07-26 ENCOUNTER — Ambulatory Visit (INDEPENDENT_AMBULATORY_CARE_PROVIDER_SITE_OTHER): Payer: Medicare Other | Admitting: Otolaryngology

## 2020-07-26 ENCOUNTER — Encounter (INDEPENDENT_AMBULATORY_CARE_PROVIDER_SITE_OTHER): Payer: Self-pay | Admitting: Otolaryngology

## 2020-07-26 ENCOUNTER — Other Ambulatory Visit: Payer: Self-pay

## 2020-07-26 VITALS — Temp 97.2°F

## 2020-07-26 DIAGNOSIS — H6122 Impacted cerumen, left ear: Secondary | ICD-10-CM | POA: Diagnosis not present

## 2020-07-26 DIAGNOSIS — H903 Sensorineural hearing loss, bilateral: Secondary | ICD-10-CM

## 2020-07-26 NOTE — Progress Notes (Signed)
HPI: Andrew [name] is a 87 y.o. male who returns today for evaluation of decreased hearing in his left ear.  He was seen by his PCP and was told that he had fluid in his ear and was treated with a short course of steroids.  He still does not feel like he hears well from the left ear.  This started a little over a month ago. Of note patient has a history of sudden right ear sensorineural hearing loss treated here 5 years ago at that time he had sudden sensorineural hearing loss in the right ear and had audiologic testing that demonstrated severe hearing loss in the right ear and a mild to moderate hearing loss in the left ear which was longstanding.  Apparently after the course of steroids that he did not tolerate well he regained hearing in the right ear which is normally his better hearing ear.. He has been diagnosed with a recent aneurysm and has been unsteady since the diagnosis.  Past Medical History:  Diagnosis Date  . AAA (abdominal aortic aneurysm) ([place])   . Angioedema 06/01/2015  . Arthritis   . Back pain   . Cancer ([place])    squamous cell skin cancer carcinomas removed from hand and face  . Chronic kidney disease    ? bph, pt self catheterizes himself on schedule  . Complication of anesthesia    had to have Foley post op cabg and lumb lam  . Coronary artery disease    s/p remote PCI // s/p CABG in 2010 Echo 01/2019: EF 60-65, Gr 1 DD, normal RVSF, RVSP 36, mild LAE, mild to mod TR  . Hypertension   . PVC's (premature ventricular contractions)    Monitor 12/2018:  Sinus rhythm with frequent PVC's (11.5%) and periods of bigeminy.   . Thoracic aortic aneurysm ([place])    09/29/16 CT: 4.3 cm ascending, 4.5 cm descending. 1 yr f/u rec // s/p endovascular repair 09/2019 (Dr. Carlis Abbott)  . Urinary retention   . Wears glasses    Past Surgical History:  Procedure Laterality Date  . BACK SURGERY  2012   lumb lam  . CARDIAC CATHETERIZATION     2010-per patient  . COLONOSCOPY    . CORONARY ARTERY  BYPASS GRAFT  2010   triple  . EPIGASTRIC HERNIA REPAIR N/A 09/11/2017   Procedure: HERNIA REPAIR EPIGASTRIC ADULT;  Surgeon: Rolm Bookbinder, MD;  Location: [facility];  Service: General;  Laterality: N/A;  . HERNIA REPAIR     rih 1981  . HERNIA REPAIR  09/11/2017   with mesh  . INGUINAL HERNIA REPAIR  07/30/2012   Procedure: HERNIA REPAIR INGUINAL ADULT;  Surgeon: Rolm Bookbinder, MD;  Location: [facility];  Service: General;  Laterality: Left;  Left Hernia Site  . INSERTION OF MESH  07/30/2012   Procedure: INSERTION OF MESH;  Surgeon: Rolm Bookbinder, MD;  Location: [facility];  Service: General;  Laterality: Left;  Left Inguinal Hernia  . INSERTION OF MESH N/A 09/11/2017   Procedure: INSERTION OF MESH;  Surgeon: Rolm Bookbinder, MD;  Location: [facility];  Service: General;  Laterality: N/A;  . LAPAROSCOPY N/A 09/11/2017   Procedure: DIAGNOSTIC LAPAOSCOPY;  Surgeon: Rolm Bookbinder, MD;  Location: [facility];  Service: General;  Laterality: N/A;  ERAS pathway  . SPINE SURGERY     lumbar laminectomy  . THORACIC AORTIC ENDOVASCULAR STENT GRAFT N/A 09/22/2019   Procedure: THORACIC AORTIC ENDOVASCULAR STENT GRAFT;  Surgeon: Marty Heck, MD;  [facility];  Service: Vascular;  Laterality: N/A;  . TONSILLECTOMY     Social History   Socioeconomic History  . Marital status: Married    Spouse name: [name]  . Number of children: Not on file  . Years of education: Not on file  . Highest education level: Not on file  Occupational History    Comment: retired  Tobacco Use  . Smoking status: Former Smoker    Packs/day: 1.00    Years: 8.00    Pack years: 8.00    Quit date: 07/24/1984    Years since quitting: 36.0  . Smokeless tobacco: Never Used  Vaping Use  . Vaping Use: Never used  Substance and Sexual Activity  . Alcohol use: No  . Drug use: No  . Sexual activity: Not on file  Other Topics Concern  . Not on file  Social History Narrative   12/08/19  lives with wife   Social Determinants of Health   Financial Resource Strain: Not on file  Food Insecurity: Not on file  Transportation Needs: Not on file  Physical Activity: Not on file  Stress: Not on file  Social Connections: Not on file   Family History  Problem Relation Age of Onset  . Allergic rhinitis Son   . Coronary artery disease Mother   . Angioedema Neg Hx   . Asthma Neg Hx   . Atopy Neg Hx   . Eczema Neg Hx   . Immunodeficiency Neg Hx   . Urticaria Neg Hx    Allergies  Allergen Reactions  . Quinolones Other (See Comments)    Patient has an abdominal aortic aneurysm    Prior to Admission medications   Medication Sig Start Date End Date Taking? Authorizing Provider  aspirin 81 MG tablet Take 81 mg by mouth daily after supper.    Yes [provider]  atorvastatin (LIPITOR) 20 MG tablet TAKE 1 TABLET ONCE DAILY AT 6 PM. 03/15/20  Yes Hilty, Nadean Corwin, MD  Cholecalciferol (VITAMIN D3) 1000 units CAPS Take 1,000 Units by mouth daily.   Yes [provider]  Co-Enzyme Q-10 100 MG CAPS Take 100 mg by mouth daily.    Yes [provider]  CRANBERRY EXTRACT PO Take 1 tablet by mouth daily.   Yes [provider]  diltiazem (DILACOR XR) 120 MG 24 hr capsule TAKE 1 CAPSULE DAILY. 07/12/20  Yes Hilty, Nadean Corwin, MD  ferrous sulfate 325 (65 FE) MG tablet Take 325 mg by mouth daily with breakfast.   Yes [provider]  furosemide (LASIX) 80 MG tablet Take 1 tablet (80 mg total) by mouth daily. 01/22/20  Yes Almyra Deforest, PA  gabapentin (NEURONTIN) 300 MG capsule Take 300 mg by mouth at bedtime.  04/18/12  Yes [provider]  ibuprofen (ADVIL) 200 MG tablet Take 200-400 mg by mouth every 6 (six) hours as needed (for pain).   Yes [provider]  loratadine (CLARITIN) 10 MG tablet Take 10 mg by mouth daily as needed for allergies or rhinitis.   Yes [provider]  Multiple Vitamins-Minerals (PRESERVISION AREDS 2 PO)  Take 1 capsule by mouth 2 (two) times daily.   Yes [provider]  nitroGLYCERIN (NITRODUR - DOSED IN MG/24 HR) 0.2 mg/hr patch Use 1/2 patch daily to the affected area 10/11/18  Yes Hudnall, Sharyn Lull, MD  nitroGLYCERIN (NITROSTAT) 0.6 MG SL tablet Place 0.6 mg under the tongue every 5 (five) minutes as needed for chest pain.  Yes [provider]  oxyCODONE-acetaminophen (PERCOCET) 5-325 MG tablet Take 1 tablet by mouth every 6 (six) hours as needed for severe pain. 09/24/19 09/23/20 Yes Baglia, Corrina, PA-C  oxymetazoline (AFRIN) 0.05 % nasal spray Place 1 spray into both nostrils at bedtime as needed for congestion.   Yes [provider]  pantoprazole (PROTONIX) 40 MG tablet Take 40 mg by mouth daily. 12/03/19  Yes [provider]  potassium chloride 20 MEQ TBCR Take 20 mEq by mouth daily. 01/22/20  Yes Azalee Course, PA  sodium chloride (MURO 128) 5 % ophthalmic ointment Place 1 application into both eyes at bedtime.   Yes [provider]  triamcinolone (NASACORT ALLERGY 24HR) 55 MCG/ACT AERO nasal inhaler Place 2 sprays into the nose daily as needed (for allergies).    Yes [provider]  TURMERIC PO Take 1 capsule by mouth daily after breakfast.   Yes [provider]  vitamin B-12 (CYANOCOBALAMIN) 1000 MCG tablet Take 1,000 mcg by mouth daily.    Yes [provider]  vitamin C (ASCORBIC ACID) 250 MG tablet Take 250 mg by mouth daily.    Yes [provider]  isosorbide mononitrate (IMDUR) 30 MG 24 hr tablet Take 0.5 tablets (15 mg total) by mouth daily. 09/27/19 01/22/20  Hughie Closs, MD     Positive ROS: Otherwise negative  All other systems have been reviewed and were otherwise negative with the exception of those mentioned in the HPI and as above.  Physical Exam: Constitutional: Alert, well-appearing, no acute distress Ears: External ears without lesions or tenderness.  Right ear canal and right TM are clear.  Left ear  canal reveals some wax buildup as well as some crusting that extends onto the TM.  This was removed in the office with forceps and suction and after cleaning the left ear canal he felt like his hearing was much better.  AC > BC on the left side but decreased hearing on both sides. Nasal: External nose without lesions. Septum deviated. Clear nasal passages otherwise. Oral: Lips and gums without lesions. Tongue and palate mucosa without lesions. Posterior oropharynx clear. Neck: No palpable adenopathy or masses Respiratory: Breathing comfortably  Skin: No facial/neck lesions or rash noted.  Cerumen impaction removal  Date/Time: 07/26/2020 2:05 PM Performed by: Drema Halon, MD Authorized by: Drema Halon, MD   Consent:    Consent obtained:  Verbal   Consent given by:  Patient   Risks discussed:  Pain and bleeding Procedure details:    Location:  L ear   Procedure type: suction and forceps   Post-procedure details:    Inspection:  TM intact and canal normal   Hearing quality:  Improved   Patient tolerance of procedure:  Tolerated well, no immediate complications Comments:     Patient with crusting and scabbing in the left ear canal left TM.  After removing this there was no obvious middle ear effusion noted.    Assessment: Bilateral underlying sensorineural hearing loss. Scabbing and crusting in the left ear which was removed in the office today.  This improved his hearing.  Plan: Discussed with him that he has underlying sensorineural hearing loss in both ears and would probably benefit from use of hearing aids. He will call us back to schedule audiologic testing if he continues to have problems with his hearing to schedule audiologic testing and to discuss hearing aids.   Narda Bonds, MD

## 2020-08-02 ENCOUNTER — Ambulatory Visit: Payer: Medicare Other | Admitting: Internal Medicine

## 2020-08-04 ENCOUNTER — Ambulatory Visit: Payer: Medicare Other | Admitting: Internal Medicine

## 2020-08-24 ENCOUNTER — Other Ambulatory Visit: Payer: Self-pay | Admitting: Internal Medicine

## 2020-09-18 ENCOUNTER — Other Ambulatory Visit: Payer: Self-pay | Admitting: Internal Medicine

## 2020-10-05 ENCOUNTER — Ambulatory Visit: Payer: Medicare Other | Admitting: Internal Medicine

## 2020-10-05 ENCOUNTER — Encounter: Payer: Self-pay | Admitting: Internal Medicine

## 2020-10-05 ENCOUNTER — Other Ambulatory Visit: Payer: Self-pay

## 2020-10-05 VITALS — BP 149/70 | HR 56 | Ht 68.0 in | Wt 149.6 lb

## 2020-10-05 DIAGNOSIS — I712 Thoracic aortic aneurysm, without rupture, unspecified: Secondary | ICD-10-CM

## 2020-10-05 DIAGNOSIS — E785 Hyperlipidemia, unspecified: Secondary | ICD-10-CM

## 2020-10-05 DIAGNOSIS — I25810 Atherosclerosis of coronary artery bypass graft(s) without angina pectoris: Secondary | ICD-10-CM

## 2020-10-05 DIAGNOSIS — I10 Essential (primary) hypertension: Secondary | ICD-10-CM

## 2020-10-05 DIAGNOSIS — Z8679 Personal history of other diseases of the circulatory system: Secondary | ICD-10-CM

## 2020-10-05 DIAGNOSIS — Z9889 Other specified postprocedural states: Secondary | ICD-10-CM | POA: Diagnosis not present

## 2020-10-05 NOTE — Progress Notes (Signed)
OFFICE NOTE  Chief Complaint:  Follow-up  Primary Care Physician: [name], MD  HPI:  Andrew [name] is a 87 y.o. male with a past medial history significant for coronary artery disease status post CABG in 2010 with prior history of angioplasty and PCI dating back at least 10 years before that.  He also has history of thoracic aortic aneurysm with a 4.3 cm ascending and 4.5 cm descending aneurysm.  Chronic kidney disease, hypertension, arthritis, skin cancer, and ongoing back problem which she is having evaluated for injection.  There is a listed a history of paroxysmal atrial fibrillation however I cannot find evidence to support this.  He is worn a monitor, last in 2017 which showed PACs and PVCs.  There is no A. fib today.  He does have PVCs.  He is not anticoagulated beyond aspirin.  Testing from his PCP showed total cholesterol 150, HDL 53, LDL 83 and triglycerides 74.  01/15/2019  Andrew [name] returns today for follow-up.  He was previously followed by Dr. Wynonia Lawman and establish care with me in March 2020 prior to the [place] pandemic.  He underwent scheduled repeat CT angiography for aortic aneurysm.  This demonstrated stable 4.2 cm ascending thoracic aortic aneurysm, however did also demonstrate an enlarging 5.3 cm mid descending thoracic aortic aneurysm (previously measuring 4.8 cm).  Referral to cardiothoracic surgery was recommended.  Additionally, there were incidentally noted mild T8 and T9 vertebral body compression deformities.  Symptomatically he is doing fairly well.  He complains of some left knee pain left lower leg swelling.  Today he is noted to be in ventricular bigeminy, on EKG.  He is noted recently having more frequent palpitations.  He says he has a longstanding history of palpitations and has been monitored for that several times in the past.  06/23/2019  Andrew [name] is seen today for follow-up.  He had been seen in the interim by Dr. Roxan Hockey with who will  follow up his thoracic aortic aneurysm.  In addition he was referred to Dr. Carlis Abbott with vascular surgery to follow-up an abdominal aortic aneurysm.  From a cardiac standpoint he is doing well.  When I last saw him he was having frequent ectopy and wore a monitor which showed PVCs at a frequency of close to 12%.  An echo was performed which showed normal systolic function.  He said this went on for a while about 2 months and then stopped pretty abruptly.  Since that time he is not had any further palpitations and his EKG today shows no evidence of any PVCs.  10/09/2019  Andrew [name] is seen today in follow-up.  Recently he underwent aneurysm surgery.  He has been having some intermittent chest discomfort.  He was seen in the hospital for this by Dr. Harl Bowie.  He ultimately underwent nuclear stress testing which was negative for ischemia.  Today in follow-up he says he is improving somewhat.  Blood pressure is little elevated today.  He has follow-up with Dr. Carlis Abbott coming up in late March.  EKG today shows sinus rhythm with some PVCs.  10/05/2020  Andrew [name] is seen today in follow-up.  He is now about a year out since he had an endovascular repair of a AAA.  He has done well with that but he has had some issues with worsening dizziness and lower extremity weakness.  He is also had multiple back surgeries and has neuropathy.  Not clear whether or not the surgery was related to the.  He is due for repeat lipids and a primary care visit which will be in May.  His cholesterol had been well controlled with total 132 and LDL 68 in the past.  He gets some PVCs but much less frequently than in the past.  He had one today and his EKG.  Denies chest pain or shortness of breath.  He denies any palpitations or A. fib.  PMHx:  Past Medical History:  Diagnosis Date  . AAA (abdominal aortic aneurysm) ([place])   . Angioedema 06/01/2015  . Arthritis   . Back pain   . Cancer ([place])    squamous cell skin cancer carcinomas  removed from hand and face  . Chronic kidney disease    ? bph, pt self catheterizes himself on schedule  . Complication of anesthesia    had to have Foley post op cabg and lumb lam  . Coronary artery disease    s/p remote PCI // s/p CABG in 2010 Echo 01/2019: EF 60-65, Gr 1 DD, normal RVSF, RVSP 36, mild LAE, mild to mod TR  . Hypertension   . PVC's (premature ventricular contractions)    Monitor 12/2018:  Sinus rhythm with frequent PVC's (11.5%) and periods of bigeminy.   . Thoracic aortic aneurysm ([place])    09/29/16 CT: 4.3 cm ascending, 4.5 cm descending. 1 yr f/u rec // s/p endovascular repair 09/2019 (Dr. Carlis Abbott)  . Urinary retention   . Wears glasses     Past Surgical History:  Procedure Laterality Date  . BACK SURGERY  2012   lumb lam  . CARDIAC CATHETERIZATION     2010-per patient  . COLONOSCOPY    . CORONARY ARTERY BYPASS GRAFT  2010   triple  . EPIGASTRIC HERNIA REPAIR N/A 09/11/2017   Procedure: HERNIA REPAIR EPIGASTRIC ADULT;  Surgeon: Rolm Bookbinder, MD;  Location: [facility];  Service: General;  Laterality: N/A;  . HERNIA REPAIR     rih 1981  . HERNIA REPAIR  09/11/2017   with mesh  . INGUINAL HERNIA REPAIR  07/30/2012   Procedure: HERNIA REPAIR INGUINAL ADULT;  Surgeon: Rolm Bookbinder, MD;  Location: [facility];  Service: General;  Laterality: Left;  Left Hernia Site  . INSERTION OF MESH  07/30/2012   Procedure: INSERTION OF MESH;  Surgeon: Rolm Bookbinder, MD;  Location: [facility];  Service: General;  Laterality: Left;  Left Inguinal Hernia  . INSERTION OF MESH N/A 09/11/2017   Procedure: INSERTION OF MESH;  Surgeon: Rolm Bookbinder, MD;  Location: [facility];  Service: General;  Laterality: N/A;  . LAPAROSCOPY N/A 09/11/2017   Procedure: DIAGNOSTIC LAPAOSCOPY;  Surgeon: Rolm Bookbinder, MD;  Location: [facility];  Service: General;  Laterality: N/A;  ERAS pathway  . SPINE SURGERY     lumbar laminectomy  . THORACIC AORTIC ENDOVASCULAR STENT  GRAFT N/A 09/22/2019   Procedure: THORACIC AORTIC ENDOVASCULAR STENT GRAFT;  Surgeon: Marty Heck, MD;  Location: [facility];  Service: Vascular;  Laterality: N/A;  . TONSILLECTOMY      FAMHx:  Family History  Problem Relation Age of Onset  . Allergic rhinitis Son   . Coronary artery disease Mother   . Angioedema Neg Hx   . Asthma Neg Hx   . Atopy Neg Hx   . Eczema Neg Hx   . Immunodeficiency Neg Hx   . Urticaria Neg Hx     SOCHx:   reports that he quit smoking about 36 years ago. He has a 8.00 pack-year  smoking history. He has never used smokeless tobacco. He reports that he does not drink alcohol and does not use drugs.  ALLERGIES:  Allergies  Allergen Reactions  . Quinolones Other (See Comments)    Patient has an abdominal aortic aneurysm     ROS: Pertinent items noted in HPI and remainder of comprehensive ROS otherwise negative.  HOME MEDS: Current Outpatient Medications on File Prior to Visit  Medication Sig Dispense Refill  . aspirin 81 MG tablet Take 81 mg by mouth daily after supper.     Marland Kitchen atorvastatin (LIPITOR) 20 MG tablet TAKE 1 TABLET ONCE DAILY AT 6 PM. 90 tablet 1  . Cholecalciferol (VITAMIN D3) 1000 units CAPS Take 1,000 Units by mouth daily.    Marland Kitchen Co-Enzyme Q-10 100 MG CAPS Take 100 mg by mouth daily.     Marland Kitchen CRANBERRY EXTRACT PO Take 1 tablet by mouth daily.    Marland Kitchen diltiazem (DILACOR XR) 120 MG 24 hr capsule TAKE 1 CAPSULE DAILY. 90 capsule 1  . ferrous sulfate 325 (65 FE) MG tablet Take 325 mg by mouth daily with breakfast.    . furosemide (LASIX) 80 MG tablet Take 1 tablet (80 mg total) by mouth daily. 90 tablet 3  . gabapentin (NEURONTIN) 300 MG capsule Take 300 mg by mouth at bedtime.     Marland Kitchen ibuprofen (ADVIL) 200 MG tablet Take 200-400 mg by mouth every 6 (six) hours as needed (for pain).    Marland Kitchen loratadine (CLARITIN) 10 MG tablet Take 10 mg by mouth daily as needed for allergies or rhinitis.    . Multiple Vitamins-Minerals (PRESERVISION AREDS 2 PO) Take 1  capsule by mouth 2 (two) times daily.    . nitrofurantoin, macrocrystal-monohydrate, (MACROBID) 100 MG capsule Take by mouth.    . nitroGLYCERIN (NITRODUR - DOSED IN MG/24 HR) 0.2 mg/hr patch Use 1/2 patch daily to the affected area 30 patch 1  . nitroGLYCERIN (NITROSTAT) 0.6 MG SL tablet Place 0.6 mg under the tongue every 5 (five) minutes as needed for chest pain.    Marland Kitchen oxymetazoline (AFRIN) 0.05 % nasal spray Place 1 spray into both nostrils at bedtime as needed for congestion.    . pantoprazole (PROTONIX) 40 MG tablet Take 40 mg by mouth daily.    . potassium chloride 20 MEQ TBCR Take 20 mEq by mouth daily. 90 tablet 3  . potassium chloride SA (KLOR-CON) 20 MEQ tablet 1 tablet with food    . sodium chloride (MURO 128) 5 % ophthalmic ointment Place 1 application into both eyes at bedtime.    . triamcinolone (NASACORT ALLERGY 24HR) 55 MCG/ACT AERO nasal inhaler Place 2 sprays into the nose daily as needed (for allergies).     . TURMERIC PO Take 1 capsule by mouth daily after breakfast.    . vitamin B-12 (CYANOCOBALAMIN) 1000 MCG tablet Take 1,000 mcg by mouth daily.     . vitamin C (ASCORBIC ACID) 250 MG tablet Take 250 mg by mouth daily.     . isosorbide mononitrate (IMDUR) 30 MG 24 hr tablet Take 0.5 tablets (15 mg total) by mouth daily. 15 tablet 0   No current facility-administered medications on file prior to visit.    LABS/IMAGING: No results found for this or any previous visit (from the past 48 hour(s)). No results found.  LIPID PANEL: No results found for: CHOL, TRIG, HDL, CHOLHDL, VLDL, LDLCALC, LDLDIRECT   WEIGHTS: Wt Readings from Last 3 Encounters:  10/05/20 149 lb 9.6 oz (67.9 kg)  07/06/20 140 lb (63.5 kg)  03/18/20 146 lb 6.4 oz (66.4 kg)    VITALS: BP (!) 149/70   Pulse (!) 56   Ht 5\' 8"  (1.727 m)   Wt 149 lb 9.6 oz (67.9 kg)   SpO2 100%   BMI 22.75 kg/m   EXAM: General appearance: alert and no distress Neck: no carotid bruit, no JVD and thyroid not  enlarged, symmetric, no tenderness/mass/nodules Lungs: clear to auscultation bilaterally Heart: regular rate and rhythm, S1, S2 normal, no murmur, click, rub or gallop Abdomen: soft, non-tender; bowel sounds normal; no masses,  no organomegaly Extremities: extremities normal, atraumatic, no cyanosis or edema Pulses: 2+ and symmetric Skin: Skin color, texture, turgor normal. No rashes or lesions Neurologic: Grossly normal Psych: Pleasant  EKG: Sinus bradycardia with first-degree AV block, right bundle branch block, inferior T wave changes-personally  ASSESSMENT: 1. Chest pain-recent negative Myoview stress test and (09/27/2019) 2. Coronary artery disease status post CABG x4 with LIMA to LAD, free RIMA to RCA and left radial to OM (04/2009) 3. Remote history of atrial fibrillation-not anticoagulated 4. Hyperlipidemia 5. History of second-degree AV block 6. Thoracic aortic aneurysm 7. Chronic venous insufficiency 8. Ventricular bigeminy - (burden of 12% in 12/2018, resolved) 9. RBBB  PLAN: 1.   Andrew [name] continues to do well.  He had a successful AAA repair about a year ago but has had some worsening dizziness and instability since then.  His may be related to chronic back issues and multiple prior surgeries and neuropathy.  He was using a walker today but has become more weak.  He is due for repeat labs in May.  His cholesterol has been well controlled.  He is not had very frequent PVCs but still has some.  He denies any A. fib.  No further chest pain.  He had stress testing last year.  Plan follow-up annually or sooner as necessary.  Pixie Casino, MD, [facility], [place] Director of the Advanced Lipid Disorders &  Cardiovascular Risk Reduction Clinic Diplomate of the American Board of Clinical Lipidology Attending Cardiologist  Direct Dial: [phone]  Fax: [phone]  Website:  [email or web address] 10/05/2020, 10:45 AM

## 2020-10-05 NOTE — Patient Instructions (Signed)
Medication Instructions:  Your physician recommends that you continue on your current medications as directed. Please refer to the Current Medication list given to you today.  *If you need a refill on your cardiac medications before your next appointment, please call your pharmacy*   Lab Work: NONE If you have labs (blood work) drawn today and your tests are completely normal, you will receive your results only by: Marland Kitchen MyChart Message (if you have MyChart) OR . A paper copy in the mail If you have any lab test that is abnormal or we need to change your treatment, we will call you to review the results.   Testing/Procedures: NONE   Follow-Up: At [facility], you and your health needs are our priority.  As part of our continuing mission to provide you with exceptional heart care, we have created designated Provider Care Teams.  These Care Teams include your primary Cardiologist (physician) and Advanced Practice Providers (APPs -  Physician Assistants and Nurse Practitioners) who all work together to provide you with the care you need, when you need it.  We recommend signing up for the patient portal called "MyChart".  Sign up information is provided on this After Visit Summary.  MyChart is used to connect with patients for Virtual Visits (Telemedicine).  Patients are able to view lab/test results, encounter notes, upcoming appointments, etc.  Non-urgent messages can be sent to your provider as well.   To learn more about what you can do with MyChart, go to [email or web address].    Your next appointment:   12 month(s)  The format for your next appointment:   In Person  Provider:   You may see Pixie Casino, MD or one of the following Advanced Practice Providers on your designated Care Team:    Almyra Deforest, PA-C  Fabian Sharp, PA-C or   Roby Lofts, [place]    Other Instructions

## 2020-12-10 ENCOUNTER — Emergency Department (HOSPITAL_BASED_OUTPATIENT_CLINIC_OR_DEPARTMENT_OTHER): Payer: Medicare Other | Admitting: Radiology

## 2020-12-10 ENCOUNTER — Other Ambulatory Visit: Payer: Self-pay

## 2020-12-10 ENCOUNTER — Inpatient Hospital Stay (HOSPITAL_BASED_OUTPATIENT_CLINIC_OR_DEPARTMENT_OTHER)
Admission: EM | Admit: 2020-12-10 | Discharge: 2021-01-10 | DRG: 673 | Disposition: A | Discharge status: Skilled Nursing Facility | Payer: Medicare Other | Attending: Internal Medicine | Admitting: Internal Medicine

## 2020-12-10 ENCOUNTER — Encounter (HOSPITAL_BASED_OUTPATIENT_CLINIC_OR_DEPARTMENT_OTHER): Payer: Self-pay | Admitting: Emergency Medicine

## 2020-12-10 ENCOUNTER — Emergency Department (HOSPITAL_BASED_OUTPATIENT_CLINIC_OR_DEPARTMENT_OTHER): Payer: Medicare Other

## 2020-12-10 DIAGNOSIS — I2510 Atherosclerotic heart disease of native coronary artery without angina pectoris: Secondary | ICD-10-CM | POA: Diagnosis present

## 2020-12-10 DIAGNOSIS — R059 Cough, unspecified: Secondary | ICD-10-CM

## 2020-12-10 DIAGNOSIS — I712 Thoracic aortic aneurysm, without rupture, unspecified: Secondary | ICD-10-CM | POA: Diagnosis present

## 2020-12-10 DIAGNOSIS — R338 Other retention of urine: Secondary | ICD-10-CM | POA: Diagnosis present

## 2020-12-10 DIAGNOSIS — Z7982 Long term (current) use of aspirin: Secondary | ICD-10-CM

## 2020-12-10 DIAGNOSIS — Z79899 Other long term (current) drug therapy: Secondary | ICD-10-CM | POA: Diagnosis not present

## 2020-12-10 DIAGNOSIS — N179 Acute kidney failure, unspecified: Secondary | ICD-10-CM

## 2020-12-10 DIAGNOSIS — Z951 Presence of aortocoronary bypass graft: Secondary | ICD-10-CM | POA: Diagnosis not present

## 2020-12-10 DIAGNOSIS — J9601 Acute respiratory failure with hypoxia: Secondary | ICD-10-CM | POA: Diagnosis present

## 2020-12-10 DIAGNOSIS — I5032 Chronic diastolic (congestive) heart failure: Secondary | ICD-10-CM | POA: Diagnosis present

## 2020-12-10 DIAGNOSIS — N170 Acute kidney failure with tubular necrosis: Secondary | ICD-10-CM | POA: Diagnosis present

## 2020-12-10 DIAGNOSIS — E869 Volume depletion, unspecified: Secondary | ICD-10-CM | POA: Diagnosis present

## 2020-12-10 DIAGNOSIS — R339 Retention of urine, unspecified: Secondary | ICD-10-CM | POA: Diagnosis not present

## 2020-12-10 DIAGNOSIS — Z66 Do not resuscitate: Secondary | ICD-10-CM | POA: Diagnosis present

## 2020-12-10 DIAGNOSIS — Z8249 Family history of ischemic heart disease and other diseases of the circulatory system: Secondary | ICD-10-CM

## 2020-12-10 DIAGNOSIS — D509 Iron deficiency anemia, unspecified: Secondary | ICD-10-CM | POA: Diagnosis present

## 2020-12-10 DIAGNOSIS — I714 Abdominal aortic aneurysm, without rupture, unspecified: Secondary | ICD-10-CM | POA: Diagnosis present

## 2020-12-10 DIAGNOSIS — Z955 Presence of coronary angioplasty implant and graft: Secondary | ICD-10-CM

## 2020-12-10 DIAGNOSIS — I130 Hypertensive heart and chronic kidney disease with heart failure and stage 1 through stage 4 chronic kidney disease, or unspecified chronic kidney disease: Secondary | ICD-10-CM | POA: Diagnosis present

## 2020-12-10 DIAGNOSIS — R5381 Other malaise: Secondary | ICD-10-CM | POA: Diagnosis present

## 2020-12-10 DIAGNOSIS — J189 Pneumonia, unspecified organism: Secondary | ICD-10-CM | POA: Diagnosis present

## 2020-12-10 DIAGNOSIS — D649 Anemia, unspecified: Secondary | ICD-10-CM | POA: Diagnosis present

## 2020-12-10 DIAGNOSIS — Z20822 Contact with and (suspected) exposure to covid-19: Secondary | ICD-10-CM | POA: Diagnosis present

## 2020-12-10 DIAGNOSIS — T82594D Other mechanical complication of infusion catheter, subsequent encounter: Secondary | ICD-10-CM

## 2020-12-10 DIAGNOSIS — N182 Chronic kidney disease, stage 2 (mild): Secondary | ICD-10-CM | POA: Diagnosis present

## 2020-12-10 DIAGNOSIS — Z87891 Personal history of nicotine dependence: Secondary | ICD-10-CM | POA: Diagnosis not present

## 2020-12-10 DIAGNOSIS — E876 Hypokalemia: Secondary | ICD-10-CM | POA: Diagnosis present

## 2020-12-10 DIAGNOSIS — T501X5A Adverse effect of loop [high-ceiling] diuretics, initial encounter: Secondary | ICD-10-CM | POA: Diagnosis present

## 2020-12-10 DIAGNOSIS — E872 Acidosis: Secondary | ICD-10-CM | POA: Diagnosis present

## 2020-12-10 DIAGNOSIS — Z7189 Other specified counseling: Secondary | ICD-10-CM | POA: Diagnosis not present

## 2020-12-10 DIAGNOSIS — Y832 Surgical operation with anastomosis, bypass or graft as the cause of abnormal reaction of the patient, or of later complication, without mention of misadventure at the time of the procedure: Secondary | ICD-10-CM | POA: Diagnosis not present

## 2020-12-10 DIAGNOSIS — N401 Enlarged prostate with lower urinary tract symptoms: Secondary | ICD-10-CM | POA: Diagnosis present

## 2020-12-10 DIAGNOSIS — I10 Essential (primary) hypertension: Secondary | ICD-10-CM | POA: Diagnosis present

## 2020-12-10 DIAGNOSIS — Z85828 Personal history of other malignant neoplasm of skin: Secondary | ICD-10-CM

## 2020-12-10 DIAGNOSIS — T8241XA Breakdown (mechanical) of vascular dialysis catheter, initial encounter: Secondary | ICD-10-CM | POA: Diagnosis not present

## 2020-12-10 DIAGNOSIS — R54 Age-related physical debility: Secondary | ICD-10-CM | POA: Diagnosis present

## 2020-12-10 DIAGNOSIS — Z515 Encounter for palliative care: Secondary | ICD-10-CM | POA: Diagnosis not present

## 2020-12-10 DIAGNOSIS — I4891 Unspecified atrial fibrillation: Secondary | ICD-10-CM | POA: Diagnosis present

## 2020-12-10 DIAGNOSIS — I959 Hypotension, unspecified: Secondary | ICD-10-CM | POA: Diagnosis present

## 2020-12-10 LAB — COMPREHENSIVE METABOLIC PANEL
ALT: 9 U/L (ref 0–44)
AST: 12 U/L — ABNORMAL LOW (ref 15–41)
Albumin: 3.5 g/dL (ref 3.5–5.0)
Alkaline Phosphatase: 66 U/L (ref 38–126)
Anion gap: 23 — ABNORMAL HIGH (ref 5–15)
BUN: 161 mg/dL — ABNORMAL HIGH (ref 8–23)
CO2: 17 mmol/L — ABNORMAL LOW (ref 22–32)
Calcium: 7.3 mg/dL — ABNORMAL LOW (ref 8.9–10.3)
Chloride: 99 mmol/L (ref 98–111)
Creatinine, Ser: 8.71 mg/dL — ABNORMAL HIGH (ref 0.61–1.24)
GFR, Estimated: 5 mL/min — ABNORMAL LOW (ref >60)
Glucose, Bld: 118 mg/dL — ABNORMAL HIGH (ref 70–99)
Potassium: 3.8 mmol/L (ref 3.5–5.1)
Sodium: 139 mmol/L (ref 135–145)
Total Bilirubin: 0.3 mg/dL (ref 0.3–1.2)
Total Protein: 7.3 g/dL (ref 6.5–8.1)

## 2020-12-10 LAB — URINALYSIS, MICROSCOPIC (REFLEX): Squamous Epithelial / HPF: NONE SEEN (ref 0–5)

## 2020-12-10 LAB — URINALYSIS, ROUTINE W REFLEX MICROSCOPIC
Bilirubin Urine: NEGATIVE
Glucose, UA: NEGATIVE mg/dL
Ketones, ur: NEGATIVE mg/dL
Leukocytes,Ua: NEGATIVE
Nitrite: NEGATIVE
Protein, ur: 30 mg/dL — AB
Specific Gravity, Urine: 1.015 (ref 1.005–1.030)
pH: 5.5 (ref 5.0–8.0)

## 2020-12-10 LAB — RESP PANEL BY RT-PCR (FLU A&B, COVID) ARPGX2
Influenza A by PCR: NEGATIVE
Influenza B by PCR: NEGATIVE
SARS Coronavirus 2 by RT PCR: NEGATIVE

## 2020-12-10 LAB — CBC WITH DIFFERENTIAL/PLATELET
Abs Immature Granulocytes: 0.18 10*3/uL — ABNORMAL HIGH (ref 0.00–0.07)
Basophils Absolute: 0.1 10*3/uL (ref 0.0–0.1)
Basophils Relative: 0 %
Eosinophils Absolute: 0.5 10*3/uL (ref 0.0–0.5)
Eosinophils Relative: 4 %
HCT: 28.4 % — ABNORMAL LOW (ref 39.0–52.0)
Hemoglobin: 9.4 g/dL — ABNORMAL LOW (ref 13.0–17.0)
Immature Granulocytes: 2 %
Lymphocytes Relative: 2 %
Lymphs Abs: 0.2 10*3/uL — ABNORMAL LOW (ref 0.7–4.0)
MCH: 29.8 pg (ref 26.0–34.0)
MCHC: 33.1 g/dL (ref 30.0–36.0)
MCV: 90.2 fL (ref 80.0–100.0)
Monocytes Absolute: 0.8 10*3/uL (ref 0.1–1.0)
Monocytes Relative: 7 %
Neutro Abs: 10 10*3/uL — ABNORMAL HIGH (ref 1.7–7.7)
Neutrophils Relative %: 85 %
Platelets: 169 10*3/uL (ref 150–400)
RBC: 3.15 MIL/uL — ABNORMAL LOW (ref 4.22–5.81)
RDW: 14.3 % (ref 11.5–15.5)
WBC: 11.8 10*3/uL — ABNORMAL HIGH (ref 4.0–10.5)
nRBC: 0 % (ref 0.0–0.2)

## 2020-12-10 LAB — CK: Total CK: 66 U/L (ref 49–397)

## 2020-12-10 MED ORDER — ALBUTEROL SULFATE HFA 108 (90 BASE) MCG/ACT IN AERS
2.0000 | INHALATION_SPRAY | RESPIRATORY_TRACT | Status: DC | PRN
  Filled 2020-12-10: qty 6.7

## 2020-12-10 MED ORDER — ONDANSETRON HCL 4 MG PO TABS: 4.0000 mg | ORAL_TABLET | Freq: Four times a day (QID) | ORAL | Status: DC | PRN

## 2020-12-10 MED ORDER — LACTATED RINGERS IV SOLN: INTRAVENOUS | Status: DC

## 2020-12-10 MED ORDER — HEPARIN SODIUM (PORCINE) 5000 UNIT/ML IJ SOLN
5000.0000 [IU] | Freq: Three times a day (TID) | INTRAMUSCULAR | Status: DC
  Administered 2020-12-11 – 2020-12-12 (×4): 5000 [IU] via SUBCUTANEOUS
  Filled 2020-12-10 (×4): qty 1

## 2020-12-10 MED ORDER — LACTATED RINGERS IV SOLN: INTRAVENOUS | Status: AC

## 2020-12-10 MED ORDER — ACETAMINOPHEN 325 MG PO TABS
650.0000 mg | ORAL_TABLET | Freq: Four times a day (QID) | ORAL | Status: DC | PRN
  Administered 2020-12-10 – 2021-01-06 (×12): 650 mg via ORAL
  Filled 2020-12-10 (×14): qty 2

## 2020-12-10 MED ORDER — ONDANSETRON HCL 4 MG/2ML IJ SOLN: 4.0000 mg | Freq: Four times a day (QID) | INTRAMUSCULAR | Status: DC | PRN

## 2020-12-10 MED ORDER — SODIUM CHLORIDE 0.9 % IV SOLN
500.0000 mg | Freq: Once | INTRAVENOUS | Status: AC
  Administered 2020-12-10: 500 mg via INTRAVENOUS
  Filled 2020-12-10: qty 500

## 2020-12-10 MED ORDER — SODIUM CHLORIDE 0.9 % IV SOLN
2.0000 g | INTRAVENOUS | Status: AC
  Administered 2020-12-11 – 2020-12-14 (×4): 2 g via INTRAVENOUS
  Filled 2020-12-10: qty 20
  Filled 2020-12-10: qty 2
  Filled 2020-12-10 (×2): qty 20

## 2020-12-10 MED ORDER — ALBUTEROL SULFATE (2.5 MG/3ML) 0.083% IN NEBU: 2.5000 mg | INHALATION_SOLUTION | RESPIRATORY_TRACT | Status: DC | PRN

## 2020-12-10 MED ORDER — ACETAMINOPHEN 650 MG RE SUPP: 650.0000 mg | Freq: Four times a day (QID) | RECTAL | Status: DC | PRN

## 2020-12-10 MED ORDER — SODIUM CHLORIDE 0.9 % IV SOLN
1.0000 g | Freq: Once | INTRAVENOUS | Status: AC
  Administered 2020-12-10: 1 g via INTRAVENOUS
  Filled 2020-12-10: qty 10

## 2020-12-10 MED ORDER — SODIUM CHLORIDE 0.9 % IV SOLN
500.0000 mg | INTRAVENOUS | Status: AC
  Administered 2020-12-11 – 2020-12-14 (×4): 500 mg via INTRAVENOUS
  Filled 2020-12-10 (×4): qty 500

## 2020-12-10 NOTE — ED Triage Notes (Signed)
Cough x 2 weeks , on antibiotic. Intermittent shortness of breath.   Spouse reports PCP send here for abnormal lab results. High cholesterol on chart.

## 2020-12-10 NOTE — ED Provider Notes (Signed)
[facility] Provider Note  CSN: [id number] Arrival date & time: 12/10/20 1536    History Chief Complaint  Patient presents with  . Cough    HPI  Andrew [name] is a 87 y.o. male brought to the ED by wife for evaluation of a cough that has been persistent for about 2 weeks, productive of a thick yellow sputum, some occasional SOB but no chest pain or fever. He had a telephone visit with PCP and prescribe augmentin which he finished but did not improve. Wife also reports he went and had some labs done yesterday and was called today and advised that the results were abnormal and he needed to come to the ED. She is unsure what those results were.   Past Medical History:  Diagnosis Date  . AAA (abdominal aortic aneurysm) ([place])   . Angioedema 06/01/2015  . Arthritis   . Back pain   . 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 Chronic kidney disease    ? bph, pt self catheterizes himself on schedule  . Complication of anesthesia    had to have Foley post op cabg and lumb lam  . Coronary artery disease    s/p remote PCI // s/p CABG in 2010 Echo 01/2019: EF 60-65, Gr 1 DD, normal RVSF, RVSP 36, mild LAE, mild to mod TR  . Hypertension   . PVC's (premature ventricular contractions)    Monitor 12/2018:  Sinus rhythm with frequent PVC's (11.5%) and periods of bigeminy.   . Thoracic aortic aneurysm ([place])    09/29/16 CT: 4.3 cm ascending, 4.5 cm descending. 1 yr f/u rec // s/p endovascular repair 09/2019 (Dr. Carlis Abbott)  . Urinary retention   . Wears glasses     Past Surgical History:  Procedure Laterality Date  . BACK SURGERY  2012   lumb lam  . CARDIAC CATHETERIZATION     2010-per patient  . COLONOSCOPY    . CORONARY ARTERY BYPASS GRAFT  2010   triple  . EPIGASTRIC HERNIA REPAIR N/A 09/11/2017   Procedure: HERNIA REPAIR EPIGASTRIC ADULT;  Surgeon: Rolm Bookbinder, MD;  Location: [facility];  Service: General;   Laterality: N/A;  . HERNIA REPAIR     rih 1981  . HERNIA REPAIR  09/11/2017   with mesh  . INGUINAL HERNIA REPAIR  07/30/2012   Procedure: HERNIA REPAIR INGUINAL ADULT;  Surgeon: Rolm Bookbinder, MD;  Location: [facility];  Service: General;  Laterality: Left;  Left Hernia Site  . INSERTION OF MESH  07/30/2012   Procedure: INSERTION OF MESH;  Surgeon: Rolm Bookbinder, MD;  Location: [facility];  Service: General;  Laterality: Left;  Left Inguinal Hernia  . INSERTION OF MESH N/A 09/11/2017   Procedure: INSERTION OF MESH;  Surgeon: Rolm Bookbinder, MD;  Location: [facility];  Service: General;  Laterality: N/A;  . LAPAROSCOPY N/A 09/11/2017   Procedure: DIAGNOSTIC LAPAOSCOPY;  Surgeon: Rolm Bookbinder, MD;  Location: [facility];  Service: General;  Laterality: N/A;  ERAS pathway  . SPINE SURGERY     lumbar laminectomy  . THORACIC AORTIC ENDOVASCULAR STENT GRAFT N/A 09/22/2019   Procedure: THORACIC AORTIC ENDOVASCULAR STENT GRAFT;  Surgeon: Marty Heck, MD;  Location: [facility];  Service: Vascular;  Laterality: N/A;  . TONSILLECTOMY      Family History  Problem Relation Age of Onset  . Allergic rhinitis Son   . Coronary artery disease Mother   .  Angioedema Neg Hx   . Asthma Neg Hx   . Atopy Neg Hx   . Eczema Neg Hx   . Immunodeficiency Neg Hx   . Urticaria Neg Hx     Social History   Tobacco Use  . Smoking status: Former Smoker    Packs/day: 1.00    Years: 8.00    Pack years: 8.00    Quit date: 07/24/1984    Years since quitting: 36.4  . Smokeless tobacco: Never Used  Vaping Use  . Vaping Use: Never used  Substance Use Topics  . Alcohol use: No  . Drug use: No     Home Medications Prior to Admission medications   Medication Sig Start Date End Date Taking? Authorizing Provider  aspirin 81 MG tablet Take 81 mg by mouth daily after supper.     [provider]  atorvastatin (LIPITOR) 20 MG tablet TAKE 1 TABLET ONCE DAILY AT 6 PM.  09/20/20   Hilty, Nadean Corwin, MD  Cholecalciferol (VITAMIN D3) 1000 units CAPS Take 1,000 Units by mouth daily.    [provider]  Co-Enzyme Q-10 100 MG CAPS Take 100 mg by mouth daily.     [provider]  CRANBERRY EXTRACT PO Take 1 tablet by mouth daily.    [provider]  diltiazem (DILACOR XR) 120 MG 24 hr capsule TAKE 1 CAPSULE DAILY. 09/20/20   Hilty, Nadean Corwin, MD  ferrous sulfate 325 (65 FE) MG tablet Take 325 mg by mouth daily with breakfast.    [provider]  furosemide (LASIX) 80 MG tablet Take 1 tablet (80 mg total) by mouth daily. 01/22/20   Almyra Deforest, PA  gabapentin (NEURONTIN) 300 MG capsule Take 300 mg by mouth at bedtime.  04/18/12   [provider]  ibuprofen (ADVIL) 200 MG tablet Take 200-400 mg by mouth every 6 (six) hours as needed (for pain).    [provider]  isosorbide mononitrate (IMDUR) 30 MG 24 hr tablet Take 0.5 tablets (15 mg total) by mouth daily. 09/27/19 01/22/20  Darliss Cheney, MD  loratadine (CLARITIN) 10 MG tablet Take 10 mg by mouth daily as needed for allergies or rhinitis.    [provider]  Multiple Vitamins-Minerals (PRESERVISION AREDS 2 PO) Take 1 capsule by mouth 2 (two) times daily.    [provider]  nitrofurantoin, macrocrystal-monohydrate, (MACROBID) 100 MG capsule Take by mouth. 04/15/20   [provider]  nitroGLYCERIN (NITRODUR - DOSED IN MG/24 HR) 0.2 mg/hr patch Use 1/2 patch daily to the affected area 10/11/18   Hudnall, Sharyn Lull, MD  nitroGLYCERIN (NITROSTAT) 0.6 MG SL tablet Place 0.6 mg under the tongue every 5 (five) minutes as needed for chest pain.    [provider]  oxymetazoline (AFRIN) 0.05 % nasal spray Place 1 spray into both nostrils at bedtime as needed for congestion.    [provider]  pantoprazole (PROTONIX) 40 MG tablet Take 40 mg by mouth daily. 12/03/19   [provider]  potassium chloride 20 MEQ TBCR Take 20 mEq by mouth  daily. 01/22/20   Almyra Deforest, PA  potassium chloride SA (KLOR-CON) 20 MEQ tablet 1 tablet with food 02/04/20   [provider]  sodium chloride (MURO 128) 5 % ophthalmic ointment Place 1 application into both eyes at bedtime.    [provider]  triamcinolone (NASACORT ALLERGY 24HR) 55 MCG/ACT AERO nasal inhaler Place 2 sprays into the nose daily as needed (for allergies).  [provider]  TURMERIC PO Take 1 capsule by mouth daily after breakfast.    [provider]  vitamin B-12 (CYANOCOBALAMIN) 1000 MCG tablet Take 1,000 mcg by mouth daily.     [provider]  vitamin C (ASCORBIC ACID) 250 MG tablet Take 250 mg by mouth daily.     [provider]     Allergies    Quinolones   Review of Systems   Review of Systems A comprehensive review of systems was completed and negative except as noted in HPI.    Physical Exam BP 124/79   Pulse 76   Temp 98.2 F (36.8 C) (Oral)   Resp 13   Ht 5\' 8"  (1.727 m)   Wt 65.3 kg   SpO2 98%   BMI 21.90 kg/m   Physical Exam Vitals and nursing note reviewed.  Constitutional:      Appearance: Normal appearance.  HENT:     Head: Normocephalic and atraumatic.     Nose: Nose normal.     Mouth/Throat:     Mouth: Mucous membranes are dry.  Eyes:     Extraocular Movements: Extraocular movements intact.     Conjunctiva/sclera: Conjunctivae normal.  Cardiovascular:     Rate and Rhythm: Normal rate.  Pulmonary:     Effort: Pulmonary effort is normal.     Breath sounds: Rhonchi and rales (R base) present.  Abdominal:     General: Abdomen is flat.     Palpations: Abdomen is soft.     Tenderness: There is no abdominal tenderness.  Musculoskeletal:        General: No swelling. Normal range of motion.     Cervical back: Neck supple.  Skin:    General: Skin is warm and dry.  Neurological:     General: No focal deficit present.     Mental Status: He is alert.  Psychiatric:        Mood and  Affect: Mood normal.      ED Results / Procedures / Treatments   Labs (all labs ordered are listed, but only abnormal results are displayed) Labs Reviewed  COMPREHENSIVE METABOLIC PANEL - Abnormal; Notable for the following components:      Result Value   CO2 17 (*)    Glucose, Bld 118 (*)    BUN 161 (*)    Creatinine, Ser 8.71 (*)    Calcium 7.3 (*)    AST 12 (*)    GFR, Estimated 5 (*)    Anion gap 23 (*)    All other components within normal limits  CBC WITH DIFFERENTIAL/PLATELET - Abnormal; Notable for the following components:   WBC 11.8 (*)    RBC 3.15 (*)    Hemoglobin 9.4 (*)    HCT 28.4 (*)    Neutro Abs 10.0 (*)    Lymphs Abs 0.2 (*)    Abs Immature Granulocytes 0.18 (*)    All other components within normal limits  URINALYSIS, ROUTINE W REFLEX MICROSCOPIC - Abnormal; Notable for the following components:   Color, Urine STRAW (*)    Hgb urine dipstick MODERATE (*)    Protein, ur 30 (*)    All other components within normal limits  URINALYSIS, MICROSCOPIC (REFLEX) - Abnormal; Notable for the following components:   Bacteria, UA RARE (*)    All other components within normal limits  RESP PANEL BY RT-PCR (FLU A&B, COVID) ARPGX2  CK  SODIUM, URINE, RANDOM  CREATININE, URINE, RANDOM  MICROALBUMIN / CREATININE  URINE RATIO  CYTOLOGY - NON PAP    EKG None   Radiology CT ABDOMEN PELVIS WO CONTRAST  Result Date: 12/10/2020 CLINICAL DATA:  Hematuria, cough.  Shortness of breath. EXAM: CT CHEST, ABDOMEN AND PELVIS WITHOUT CONTRAST TECHNIQUE: Multidetector CT imaging of the chest, abdomen and pelvis was performed following the standard protocol without IV contrast. COMPARISON:  July 01, 2020. FINDINGS: CT CHEST FINDINGS Cardiovascular: Status post stent graft repair of transverse aortic arch and descending thoracic aorta. Status post coronary bypass graft. Normal cardiac size. No pericardial effusion. Stable aneurysmal dilatation of distal descending thoracic aorta  measuring 5.5 cm. Mediastinum/Nodes: No enlarged mediastinal, hilar, or axillary lymph nodes. Thyroid gland, trachea, and esophagus demonstrate no significant findings. Lungs/Pleura: No pneumothorax is noted. Stable probable scarring is noted in the right lower lobe. New airspace opacity is noted in left upper lobe concerning for pneumonia. Musculoskeletal: No chest wall mass or suspicious bone lesions identified. CT ABDOMEN PELVIS FINDINGS Hepatobiliary: No focal liver abnormality is seen. No gallstones, gallbladder wall thickening, or biliary dilatation. Pancreas: Unremarkable. No pancreatic ductal dilatation or surrounding inflammatory changes. Spleen: Normal in size without focal abnormality. Adrenals/Urinary Tract: Adrenal glands are unremarkable. Kidneys are normal, without renal calculi, focal lesion, or hydronephrosis. Bladder is unremarkable. Stomach/Bowel: The stomach appears normal. There is no evidence of bowel obstruction or inflammation. Vascular/Lymphatic: Aneurysmal dilatation of abdominal aorta is noted, with maximum measured diameter 5.1 cm which is not significantly changed compared to prior exam. No adenopathy is noted. Reproductive: Prostate is unremarkable. Other: No abdominal wall hernia or abnormality. No abdominopelvic ascites. Musculoskeletal: No acute or significant osseous findings. IMPRESSION: New left upper lobe airspace opacity is noted concerning for possible pneumonia. Status post stent graft repair of descending thoracic aortic aneurysm, which has a maximum measured diameter 5.5 cm distally which is unchanged compared to prior exam. Grossly stable 5.1 cm infrarenal abdominal aortic aneurysm is noted. Recommend follow-up CT/MR every 6 months and vascular consultation. This recommendation follows ACR consensus guidelines: White Paper of the ACR Incidental Findings Committee II on Vascular Findings. J Am Coll Radiol 2013; 10:789-794. Aortic Atherosclerosis (ICD10-I70.0). Electronically  Signed   By: Marijo Conception M.D.   On: 12/10/2020 19:34   CT Chest Wo Contrast  Result Date: 12/10/2020 CLINICAL DATA:  Hematuria, cough.  Shortness of breath. EXAM: CT CHEST, ABDOMEN AND PELVIS WITHOUT CONTRAST TECHNIQUE: Multidetector CT imaging of the chest, abdomen and pelvis was performed following the standard protocol without IV contrast. COMPARISON:  July 01, 2020. FINDINGS: CT CHEST FINDINGS Cardiovascular: Status post stent graft repair of transverse aortic arch and descending thoracic aorta. Status post coronary bypass graft. Normal cardiac size. No pericardial effusion. Stable aneurysmal dilatation of distal descending thoracic aorta measuring 5.5 cm. Mediastinum/Nodes: No enlarged mediastinal, hilar, or axillary lymph nodes. Thyroid gland, trachea, and esophagus demonstrate no significant findings. Lungs/Pleura: No pneumothorax is noted. Stable probable scarring is noted in the right lower lobe. New airspace opacity is noted in left upper lobe concerning for pneumonia. Musculoskeletal: No chest wall mass or suspicious bone lesions identified. CT ABDOMEN PELVIS FINDINGS Hepatobiliary: No focal liver abnormality is seen. No gallstones, gallbladder wall thickening, or biliary dilatation. Pancreas: Unremarkable. No pancreatic ductal dilatation or surrounding inflammatory changes. Spleen: Normal in size without focal abnormality. Adrenals/Urinary Tract: Adrenal glands are unremarkable. Kidneys are normal, without renal calculi, focal lesion, or hydronephrosis. Bladder is unremarkable. Stomach/Bowel: The stomach appears normal. There is no evidence of bowel obstruction or inflammation. Vascular/Lymphatic: Aneurysmal dilatation of abdominal aorta  is noted, with maximum measured diameter 5.1 cm which is not significantly changed compared to prior exam. No adenopathy is noted. Reproductive: Prostate is unremarkable. Other: No abdominal wall hernia or abnormality. No abdominopelvic ascites.  Musculoskeletal: No acute or significant osseous findings. IMPRESSION: New left upper lobe airspace opacity is noted concerning for possible pneumonia. Status post stent graft repair of descending thoracic aortic aneurysm, which has a maximum measured diameter 5.5 cm distally which is unchanged compared to prior exam. Grossly stable 5.1 cm infrarenal abdominal aortic aneurysm is noted. Recommend follow-up CT/MR every 6 months and vascular consultation. This recommendation follows ACR consensus guidelines: White Paper of the ACR Incidental Findings Committee II on Vascular Findings. J Am Coll Radiol 2013; 10:789-794. Aortic Atherosclerosis (ICD10-I70.0). Electronically Signed   By: Marijo Conception M.D.   On: 12/10/2020 19:34   DG Chest Port 1 View  Result Date: 12/10/2020 CLINICAL DATA:  Cough and shortness of breath for 2 weeks EXAM: PORTABLE CHEST 1 VIEW COMPARISON:  07/01/2020 FINDINGS: Cardiac shadow is enlarged but stable. Diffuse aortic stent graft is noted in the descending thoracic aorta. Postsurgical changes are again noted and stable. Chronic pleural thickening laterally in the right lung base is noted. No focal infiltrate or sizable effusion is seen. No bony abnormality is seen. Stable calcified pleural plaques are noted. IMPRESSION: Stable appearance of the chest as described. Electronically Signed   By: Inez Catalina M.D.   On: 12/10/2020 16:58    Procedures Procedures  Medications Ordered in the ED Medications  lactated ringers infusion ( Intravenous New Bag/Given 12/10/20 1856)  albuterol (PROVENTIL) (2.5 MG/3ML) 0.083% nebulizer solution 2.5 mg (has no administration in time range)  cefTRIAXone (ROCEPHIN) 1 g in sodium chloride 0.9 % 100 mL IVPB (0 g Intravenous Stopped 12/10/20 2037)  azithromycin (ZITHROMAX) 500 mg in sodium chloride 0.9 % 250 mL IVPB (0 mg Intravenous Stopped 12/10/20 2142)     MDM Rules/Calculators/A&P MDM Spoke with staff at [facility] who have faxed results from [place]  done 5/19 showing markedly elevated creatinine/BUN 8.37/160 and anion gap 27. Will check labs here as well as CXR and EKG.  ED Course  I have reviewed the triage vital signs and the nursing notes.  Pertinent labs & imaging results that were available during my care of the patient were reviewed by me and considered in my medical decision making (see chart for details).  Clinical Course as of 12/10/20 2307  Fri Dec 10, 2020  1722 CBC with mild leukocytosis, anemia worsened from previous. [CS]  2951 CXR shows chronic pleural thickening in R lung base, no focal infiltrates.  [CS]  8841 CMP confirms outpatient tests with AKI, will begin IVF.  [CS]  [id number] Spoke with Dr. Roosevelt Locks, who will accept for admission. He requests we add CK to check for rhabdo and add CT abd/pel to eval kidneys.  [CS]  1940 CT shows new infiltrate, will give Rocephin/Zithromax for CAP.  [CS]    Clinical Course User Index [CS] Truddie Hidden, MD    Final Clinical Impression(s) / ED Diagnoses Final diagnoses:  Community acquired pneumonia, unspecified laterality  AKI (acute kidney injury) [facility])    Rx / [place] Orders ED Discharge Orders    None       Truddie Hidden, MD 12/10/20 2307

## 2020-12-10 NOTE — H&P (Signed)
History and Physical    Andrew [name] PFX:[id number] DOB: 08/17/1931 DOA: 12/10/2020  PCP: [name], MD   Patient coming from: Home   Chief Complaint: Abnormal labs, productive cough   HPI: Andrew [name] is a 87 y.o. male with medical history significant for thoracic aortic aneurysm status post stent graft repair, CABG in 2010, AAA, chronic diastolic CHF, BPH, urinary retention, CKD II, and remote atrial fibrillation not anticoagulated, now presenting to the emergency department for evaluation of abnormal blood work.  Patient reports that he has had a productive cough and mild malaise for roughly 2 weeks that did not improve with a course of Augmentin that he just completed.  He followed up with PCP, blood work was obtained, and he was directed to the ED for further evaluation of abnormal results.  Patient reports a loss of appetite for at least a week but denies any nausea, vomiting, diarrhea, abdominal pain, flank pain, or change in his urine.  Patient reports that his chronic bilateral ankle swelling is unchanged and notes that he does not take his Lasix and potassium supplement regularly.  [facility] ED Course: Upon arrival to the ED, patient is found to be afebrile, saturating well on room air, and with stable blood pressure.  Chemistry panel is notable for BUN 161, creatinine 8.71, bicarbonate 17, and anion gap 23.  CBC is notable for leukocytosis to 11,800 and a normocytic anemia with hemoglobin 9.4.  CT the chest, abdomen, and pelvis is notable for new left upper lobe infiltrate, unchanged stent graft repair of thoracic aortic aneurysm, and grossly stable infrarenal AAA.  Patient was treated with Rocephin, azithromycin, albuterol, and IV fluids in the ED.  Review of Systems:  All other systems reviewed and apart from HPI, are negative.  Past Medical History:  Diagnosis Date  . AAA (abdominal aortic aneurysm) ([place])   . Angioedema 06/01/2015  . Arthritis   . Back  pain   . 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 Chronic kidney disease    ? bph, pt self catheterizes himself on schedule  . Complication of anesthesia    had to have Foley post op cabg and lumb lam  . Coronary artery disease    s/p remote PCI // s/p CABG in 2010 Echo 01/2019: EF 60-65, Gr 1 DD, normal RVSF, RVSP 36, mild LAE, mild to mod TR  . Hypertension   . PVC's (premature ventricular contractions)    Monitor 12/2018:  Sinus rhythm with frequent PVC's (11.5%) and periods of bigeminy.   . Thoracic aortic aneurysm ([place])    09/29/16 CT: 4.3 cm ascending, 4.5 cm descending. 1 yr f/u rec // s/p endovascular repair 09/2019 (Dr. Carlis Abbott)  . Urinary retention   . Wears glasses     Past Surgical History:  Procedure Laterality Date  . BACK SURGERY  2012   lumb lam  . CARDIAC CATHETERIZATION     2010-per patient  . COLONOSCOPY    . CORONARY ARTERY BYPASS GRAFT  2010   triple  . EPIGASTRIC HERNIA REPAIR N/A 09/11/2017   Procedure: HERNIA REPAIR EPIGASTRIC ADULT;  Surgeon: Rolm Bookbinder, MD;  Location: [facility];  Service: General;  Laterality: N/A;  . HERNIA REPAIR     rih 1981  . HERNIA REPAIR  09/11/2017   with mesh  . INGUINAL HERNIA REPAIR  07/30/2012   Procedure: HERNIA REPAIR INGUINAL ADULT;  Surgeon: Rolm Bookbinder, MD;  Location:  [facility];  Service: General;  Laterality: Left;  Left Hernia Site  . INSERTION OF MESH  07/30/2012   Procedure: INSERTION OF MESH;  Surgeon: Rolm Bookbinder, MD;  Location: [facility];  Service: General;  Laterality: Left;  Left Inguinal Hernia  . INSERTION OF MESH N/A 09/11/2017   Procedure: INSERTION OF MESH;  Surgeon: Rolm Bookbinder, MD;  Location: [facility];  Service: General;  Laterality: N/A;  . LAPAROSCOPY N/A 09/11/2017   Procedure: DIAGNOSTIC LAPAOSCOPY;  Surgeon: Rolm Bookbinder, MD;  Location: [facility];  Service: General;  Laterality: N/A;  ERAS pathway  . SPINE SURGERY      lumbar laminectomy  . THORACIC AORTIC ENDOVASCULAR STENT GRAFT N/A 09/22/2019   Procedure: THORACIC AORTIC ENDOVASCULAR STENT GRAFT;  Surgeon: Marty Heck, MD;  Location: [facility];  Service: Vascular;  Laterality: N/A;  . TONSILLECTOMY      Social History:   reports that he quit smoking about 36 years ago. He has a 8.00 pack-year smoking history. He has never used smokeless tobacco. He reports that he does not drink alcohol and does not use drugs.  Allergies  Allergen Reactions  . Quinolones Other (See Comments)    Patient has an abdominal aortic aneurysm     Family History  Problem Relation Age of Onset  . Allergic rhinitis Son   . Coronary artery disease Mother   . Angioedema Neg Hx   . Asthma Neg Hx   . Atopy Neg Hx   . Eczema Neg Hx   . Immunodeficiency Neg Hx   . Urticaria Neg Hx      Prior to Admission medications   Medication Sig Start Date End Date Taking? Authorizing Provider  aspirin 81 MG tablet Take 81 mg by mouth daily after supper.     [provider]  atorvastatin (LIPITOR) 20 MG tablet TAKE 1 TABLET ONCE DAILY AT 6 PM. 09/20/20   Hilty, Nadean Corwin, MD  Cholecalciferol (VITAMIN D3) 1000 units CAPS Take 1,000 Units by mouth daily.    [provider]  Co-Enzyme Q-10 100 MG CAPS Take 100 mg by mouth daily.     [provider]  CRANBERRY EXTRACT PO Take 1 tablet by mouth daily.    [provider]  diltiazem (DILACOR XR) 120 MG 24 hr capsule TAKE 1 CAPSULE DAILY. 09/20/20   Hilty, Nadean Corwin, MD  ferrous sulfate 325 (65 FE) MG tablet Take 325 mg by mouth daily with breakfast.    [provider]  furosemide (LASIX) 80 MG tablet Take 1 tablet (80 mg total) by mouth daily. 01/22/20   Almyra Deforest, PA  gabapentin (NEURONTIN) 300 MG capsule Take 300 mg by mouth at bedtime.  04/18/12   [provider]  ibuprofen (ADVIL) 200 MG tablet Take 200-400 mg by mouth every 6 (six) hours as needed (for pain).    [provider]  isosorbide mononitrate (IMDUR) 30 MG 24 hr tablet Take 0.5 tablets (15 mg total) by mouth daily. 09/27/19 01/22/20  Darliss Cheney, MD  loratadine (CLARITIN) 10 MG tablet Take 10 mg by mouth daily as needed for allergies or rhinitis.    [provider]  Multiple Vitamins-Minerals (PRESERVISION AREDS 2 PO) Take 1 capsule by mouth 2 (two) times daily.    [provider]  nitrofurantoin, macrocrystal-monohydrate, (MACROBID) 100 MG capsule Take by mouth. 04/15/20   [provider]  nitroGLYCERIN (NITRODUR - DOSED IN MG/24 HR) 0.2 mg/hr patch Use 1/2 patch daily  to the affected area 10/11/18   Hudnall, Sharyn Lull, MD  nitroGLYCERIN (NITROSTAT) 0.6 MG SL tablet Place 0.6 mg under the tongue every 5 (five) minutes as needed for chest pain.    [provider]  oxymetazoline (AFRIN) 0.05 % nasal spray Place 1 spray into both nostrils at bedtime as needed for congestion.    [provider]  pantoprazole (PROTONIX) 40 MG tablet Take 40 mg by mouth daily. 12/03/19   [provider]  potassium chloride 20 MEQ TBCR Take 20 mEq by mouth daily. 01/22/20   Almyra Deforest, PA  potassium chloride SA (KLOR-CON) 20 MEQ tablet 1 tablet with food 02/04/20   [provider]  sodium chloride (MURO 128) 5 % ophthalmic ointment Place 1 application into both eyes at bedtime.    [provider]  triamcinolone (NASACORT ALLERGY 24HR) 55 MCG/ACT AERO nasal inhaler Place 2 sprays into the nose daily as needed (for allergies).     [provider]  TURMERIC PO Take 1 capsule by mouth daily after breakfast.    [provider]  vitamin B-12 (CYANOCOBALAMIN) 1000 MCG tablet Take 1,000 mcg by mouth daily.     [provider]  vitamin C (ASCORBIC ACID) 250 MG tablet Take 250 mg by mouth daily.     [provider]    Physical Exam: Vitals:   12/10/20 1830 12/10/20 1900 12/10/20 2000 12/10/20 2247  BP: 128/77 125/80 124/79   Pulse: 73 75 76    Resp: 11 14 13    Temp:    98.2 F (36.8 C)  TempSrc:    Oral  SpO2: 96% 95% 98% 98%  Weight:      Height:        Constitutional: NAD, calm  Eyes: PERTLA, lids and conjunctivae normal ENMT: Mucous membranes are moist. Posterior pharynx clear of any exudate or lesions.   Neck: supple, no masses  Respiratory:  no wheezing, no crackles. No accessory muscle use.  Cardiovascular: S1 & S2 heard, regular rate and rhythm. Pitting ankle edema bilaterally.  Abdomen: No distension, no tenderness, soft. Bowel sounds active.  Musculoskeletal: no clubbing / cyanosis. No joint deformity upper and lower extremities.   Skin: no significant rashes, lesions, ulcers. Warm, dry, well-perfused. Neurologic: Gross hearing deficit, no facial asymmetry. Sensation intact. Moving all extremities.  Psychiatric: Alert and oriented to person, place, and situation. Pleasant and cooperative.    Labs and Imaging on Admission: I have personally reviewed following labs and imaging studies  CBC: Recent Labs  Lab 12/10/20 1713 12/10/20 2323  WBC 11.8* 9.9  NEUTROABS 10.0*  --   HGB 9.4* 8.1*  HCT 28.4* 23.9*  MCV 90.2 90.2  PLT 169 742   Basic Metabolic Panel: Recent Labs  Lab 12/10/20 1713 12/10/20 2323  NA 139 137  K 3.8 3.4*  CL 99 101  CO2 17* 17*  GLUCOSE 118* 114*  BUN 161* 150*  CREATININE 8.71* 9.32*  CALCIUM 7.3* 7.1*   GFR: Estimated Creatinine Clearance: 5.3 mL/min (A) (by C-G formula based on SCr of 9.32 mg/dL (H)). Liver Function Tests: Recent Labs  Lab 12/10/20 1713  AST 12*  ALT 9  ALKPHOS 66  BILITOT 0.3  PROT 7.3  ALBUMIN 3.5   No results for input(s): LIPASE, AMYLASE in the last 168 hours. No results for input(s): AMMONIA in the last 168 hours. Coagulation Profile: No results for input(s): INR, PROTIME in the last 168 hours. Cardiac Enzymes: Recent Labs  Lab 12/10/20 [phone]  CKTOTAL 66   BNP (last 3 results) Recent Labs    01/09/20 1443  PROBNP 1,018*    HbA1C: No results for input(s): HGBA1C in the last 72 hours. CBG: No results for input(s): GLUCAP in the last 168 hours. Lipid Profile: No results for input(s): CHOL, HDL, LDLCALC, TRIG, CHOLHDL, LDLDIRECT in the last 72 hours. Thyroid Function Tests: No results for input(s): TSH, T4TOTAL, FREET4, T3FREE, THYROIDAB in the last 72 hours. Anemia Panel: Recent Labs    12/10/20 2323  RETICCTPCT 1.5   Urine analysis:    Component Value Date/Time   COLORURINE STRAW (A) 12/10/2020 1713   APPEARANCEUR CLEAR 12/10/2020 1713   LABSPEC 1.015 12/10/2020 1713   PHURINE 5.5 12/10/2020 1713   GLUCOSEU NEGATIVE 12/10/2020 1713   HGBUR MODERATE (A) 12/10/2020 1713   BILIRUBINUR NEGATIVE 12/10/2020 1713   KETONESUR NEGATIVE 12/10/2020 1713   PROTEINUR 30 (A) 12/10/2020 1713   UROBILINOGEN 0.2 06/09/2009 0918   NITRITE NEGATIVE 12/10/2020 1713   LEUKOCYTESUR NEGATIVE 12/10/2020 1713   Sepsis Labs: @LABRCNTIP (procalcitonin:4,lacticidven:4) ) Recent Results (from the past 240 hour(s))  Resp Panel by RT-PCR (Flu A&B, Covid) Nasopharyngeal Swab     Status: None   Collection Time: 12/10/20  5:13 PM   Specimen: Nasopharyngeal Swab; Nasopharyngeal(NP) swabs in vial transport medium  Result Value Ref Range Status   SARS Coronavirus 2 by RT PCR NEGATIVE NEGATIVE Final    Comment: (NOTE) SARS-CoV-2 target nucleic acids are NOT DETECTED.  The SARS-CoV-2 RNA is generally detectable in upper respiratory specimens during the acute phase of infection. The lowest concentration of SARS-CoV-2 viral copies this assay can detect is 138 copies/mL. A negative result does not preclude SARS-Cov-2 infection and should not be used as the sole basis for treatment or other patient management decisions. A negative result may occur with  improper specimen collection/handling, submission of specimen other than nasopharyngeal swab, presence of viral mutation(s) within the areas targeted by this assay, and  inadequate number of viral copies(<138 copies/mL). A negative result must be combined with clinical observations, patient history, and epidemiological information. The expected result is Negative.  Fact Sheet for Patients:  EntrepreneurPulse.com.au  Fact Sheet for Healthcare Providers:  IncredibleEmployment.be  This test is no t yet approved or cleared by the [place] FDA and  has been authorized for detection and/or diagnosis of SARS-CoV-2 by FDA under an Emergency Use Authorization (EUA). This EUA will remain  in effect (meaning this test can be used) for the duration of the COVID-19 declaration under Section 564(b)(1) of the Act, 21 U.S.C.section 360bbb-3(b)(1), unless the authorization is terminated  or revoked sooner.       Influenza A by PCR NEGATIVE NEGATIVE Final   Influenza B by PCR NEGATIVE NEGATIVE Final    Comment: (NOTE) The Xpert Xpress SARS-CoV-2/FLU/RSV plus assay is intended as an aid in the diagnosis of influenza from Nasopharyngeal swab specimens and should not be used as a sole basis for treatment. Nasal washings and aspirates are unacceptable for Xpert Xpress SARS-CoV-2/FLU/RSV testing.  Fact Sheet for Patients: EntrepreneurPulse.com.au  Fact Sheet for Healthcare Providers: IncredibleEmployment.be  This test is not yet approved or cleared by the [place] FDA and has been authorized for detection and/or diagnosis of SARS-CoV-2 by FDA under an Emergency Use Authorization (EUA). This EUA will remain in effect (meaning this test can be used) for the duration of the COVID-19 declaration under Section 564(b)(1) of the Act, 21 U.S.C. section 360bbb-3(b)(1), unless the authorization is terminated or revoked.  Performed  at [facility], [address], [place], [place] [address]      Radiological Exams on Admission: CT ABDOMEN PELVIS WO CONTRAST  Result  Date: 12/10/2020 CLINICAL DATA:  Hematuria, cough.  Shortness of breath. EXAM: CT CHEST, ABDOMEN AND PELVIS WITHOUT CONTRAST TECHNIQUE: Multidetector CT imaging of the chest, abdomen and pelvis was performed following the standard protocol without IV contrast. COMPARISON:  July 01, 2020. FINDINGS: CT CHEST FINDINGS Cardiovascular: Status post stent graft repair of transverse aortic arch and descending thoracic aorta. Status post coronary bypass graft. Normal cardiac size. No pericardial effusion. Stable aneurysmal dilatation of distal descending thoracic aorta measuring 5.5 cm. Mediastinum/Nodes: No enlarged mediastinal, hilar, or axillary lymph nodes. Thyroid gland, trachea, and esophagus demonstrate no significant findings. Lungs/Pleura: No pneumothorax is noted. Stable probable scarring is noted in the right lower lobe. New airspace opacity is noted in left upper lobe concerning for pneumonia. Musculoskeletal: No chest wall mass or suspicious bone lesions identified. CT ABDOMEN PELVIS FINDINGS Hepatobiliary: No focal liver abnormality is seen. No gallstones, gallbladder wall thickening, or biliary dilatation. Pancreas: Unremarkable. No pancreatic ductal dilatation or surrounding inflammatory changes. Spleen: Normal in size without focal abnormality. Adrenals/Urinary Tract: Adrenal glands are unremarkable. Kidneys are normal, without renal calculi, focal lesion, or hydronephrosis. Bladder is unremarkable. Stomach/Bowel: The stomach appears normal. There is no evidence of bowel obstruction or inflammation. Vascular/Lymphatic: Aneurysmal dilatation of abdominal aorta is noted, with maximum measured diameter 5.1 cm which is not significantly changed compared to prior exam. No adenopathy is noted. Reproductive: Prostate is unremarkable. Other: No abdominal wall hernia or abnormality. No abdominopelvic ascites. Musculoskeletal: No acute or significant osseous findings. IMPRESSION: New left upper lobe airspace  opacity is noted concerning for possible pneumonia. Status post stent graft repair of descending thoracic aortic aneurysm, which has a maximum measured diameter 5.5 cm distally which is unchanged compared to prior exam. Grossly stable 5.1 cm infrarenal abdominal aortic aneurysm is noted. Recommend follow-up CT/MR every 6 months and vascular consultation. This recommendation follows ACR consensus guidelines: White Paper of the ACR Incidental Findings Committee II on Vascular Findings. J Am Coll Radiol 2013; 10:789-794. Aortic Atherosclerosis (ICD10-I70.0). Electronically Signed   By: Marijo Conception M.D.   On: 12/10/2020 19:34   CT Chest Wo Contrast  Result Date: 12/10/2020 CLINICAL DATA:  Hematuria, cough.  Shortness of breath. EXAM: CT CHEST, ABDOMEN AND PELVIS WITHOUT CONTRAST TECHNIQUE: Multidetector CT imaging of the chest, abdomen and pelvis was performed following the standard protocol without IV contrast. COMPARISON:  July 01, 2020. FINDINGS: CT CHEST FINDINGS Cardiovascular: Status post stent graft repair of transverse aortic arch and descending thoracic aorta. Status post coronary bypass graft. Normal cardiac size. No pericardial effusion. Stable aneurysmal dilatation of distal descending thoracic aorta measuring 5.5 cm. Mediastinum/Nodes: No enlarged mediastinal, hilar, or axillary lymph nodes. Thyroid gland, trachea, and esophagus demonstrate no significant findings. Lungs/Pleura: No pneumothorax is noted. Stable probable scarring is noted in the right lower lobe. New airspace opacity is noted in left upper lobe concerning for pneumonia. Musculoskeletal: No chest wall mass or suspicious bone lesions identified. CT ABDOMEN PELVIS FINDINGS Hepatobiliary: No focal liver abnormality is seen. No gallstones, gallbladder wall thickening, or biliary dilatation. Pancreas: Unremarkable. No pancreatic ductal dilatation or surrounding inflammatory changes. Spleen: Normal in size without focal abnormality.  Adrenals/Urinary Tract: Adrenal glands are unremarkable. Kidneys are normal, without renal calculi, focal lesion, or hydronephrosis. Bladder is unremarkable. Stomach/Bowel: The stomach appears normal. There is no evidence of bowel obstruction or inflammation.  Vascular/Lymphatic: Aneurysmal dilatation of abdominal aorta is noted, with maximum measured diameter 5.1 cm which is not significantly changed compared to prior exam. No adenopathy is noted. Reproductive: Prostate is unremarkable. Other: No abdominal wall hernia or abnormality. No abdominopelvic ascites. Musculoskeletal: No acute or significant osseous findings. IMPRESSION: New left upper lobe airspace opacity is noted concerning for possible pneumonia. Status post stent graft repair of descending thoracic aortic aneurysm, which has a maximum measured diameter 5.5 cm distally which is unchanged compared to prior exam. Grossly stable 5.1 cm infrarenal abdominal aortic aneurysm is noted. Recommend follow-up CT/MR every 6 months and vascular consultation. This recommendation follows ACR consensus guidelines: White Paper of the ACR Incidental Findings Committee II on Vascular Findings. J Am Coll Radiol 2013; 10:789-794. Aortic Atherosclerosis (ICD10-I70.0). Electronically Signed   By: Marijo Conception M.D.   On: 12/10/2020 19:34   DG Chest Port 1 View  Result Date: 12/10/2020 CLINICAL DATA:  Cough and shortness of breath for 2 weeks EXAM: PORTABLE CHEST 1 VIEW COMPARISON:  07/01/2020 FINDINGS: Cardiac shadow is enlarged but stable. Diffuse aortic stent graft is noted in the descending thoracic aorta. Postsurgical changes are again noted and stable. Chronic pleural thickening laterally in the right lung base is noted. No focal infiltrate or sizable effusion is seen. No bony abnormality is seen. Stable calcified pleural plaques are noted. IMPRESSION: Stable appearance of the chest as described. Electronically Signed   By: Inez Catalina M.D.   On: 12/10/2020 16:58     Assessment/Plan   1. Acute renal failure superimposed on CKD II; uremia; metabolic acidosis   - Presents for evaluation of renal failure on outpatient blood work and is found to have BUN of 161 and SCr 8.71 (44 & 1.09 in August '21) with serum bicarb of 17 and normal potassium  - Kidneys appear normal with no obstruction on CT in ED - He has had decreased appetite recently but no N/V/D; recently completed a course of Augmentin, denies any other new medications  - Check FENa, hold Lasix and potassium, continue IVF hydration, renally-dose medications, monitor UOP, repeat chem panel in am    2. Pneumonia  - Continues to have productive cough despite recent outpatient antibiotics  - New LUL infiltrate noted on CT in ED  - Rocephin and azithromycin started in ED  - Continue Rocephin and azithromycin, trend procalcitonin   3. CAD  - No anginal complaints  - Continue ASA and Lipitor   4. Chronic diastolic CHF  - Appears compensated  - Hold Lasix while hydrating, monitor weight and I/Os     5. Thoracic aortic aneurysm; AAA - Appears stable on CT in ED  - Continue vascular surgery follow-up on discharge   6. Anemia  - Hgb is 9.4, down from 11.7 in March 2021  - No overt bleeding, check anemia panel and monitor, transfuse if needed    DVT prophylaxis: sq heparin  Code Status: Full, confirmed with patient  Level of Care: Level of care: Progressive Family Communication: None present  Disposition Plan:  Patient is from: Home  Anticipated d/c is to: TBD Anticipated d/c date is: 12/14/20 Patient currently: pending improvement in renal function  Consults called: None  Admission status: Inpatient     Vianne Bulls, MD [facility]  12/11/2020, 12:35 AM

## 2020-12-10 NOTE — Progress Notes (Signed)
Patient is mildly diminished in the bases at this time.  Patient states the he is not short of breath and feeling better.  Patient has a productive cough and is able to clear his secretions.  RT will continue to monitor.

## 2020-12-11 ENCOUNTER — Encounter (HOSPITAL_COMMUNITY): Payer: Self-pay | Admitting: Family Medicine

## 2020-12-11 DIAGNOSIS — R059 Cough, unspecified: Secondary | ICD-10-CM | POA: Diagnosis not present

## 2020-12-11 DIAGNOSIS — I10 Essential (primary) hypertension: Secondary | ICD-10-CM

## 2020-12-11 DIAGNOSIS — Z7189 Other specified counseling: Secondary | ICD-10-CM

## 2020-12-11 DIAGNOSIS — Z515 Encounter for palliative care: Secondary | ICD-10-CM

## 2020-12-11 DIAGNOSIS — I5032 Chronic diastolic (congestive) heart failure: Secondary | ICD-10-CM | POA: Diagnosis not present

## 2020-12-11 DIAGNOSIS — N179 Acute kidney failure, unspecified: Secondary | ICD-10-CM | POA: Diagnosis not present

## 2020-12-11 DIAGNOSIS — J189 Pneumonia, unspecified organism: Secondary | ICD-10-CM | POA: Diagnosis not present

## 2020-12-11 LAB — TYPE AND SCREEN
ABO/RH(D): O POS
Antibody Screen: NEGATIVE

## 2020-12-11 LAB — SODIUM, URINE, RANDOM: Sodium, Ur: 52 mmol/L

## 2020-12-11 LAB — IRON AND TIBC
Iron: 26 ug/dL — ABNORMAL LOW (ref 45–182)
Saturation Ratios: 11 % — ABNORMAL LOW (ref 17.9–39.5)
TIBC: 232 ug/dL — ABNORMAL LOW (ref 250–450)
UIBC: 206 ug/dL

## 2020-12-11 LAB — BASIC METABOLIC PANEL
Anion gap: 19 — ABNORMAL HIGH (ref 5–15)
BUN: 150 mg/dL — ABNORMAL HIGH (ref 8–23)
CO2: 17 mmol/L — ABNORMAL LOW (ref 22–32)
Calcium: 7.1 mg/dL — ABNORMAL LOW (ref 8.9–10.3)
Chloride: 101 mmol/L (ref 98–111)
Creatinine, Ser: 9.32 mg/dL — ABNORMAL HIGH (ref 0.61–1.24)
GFR, Estimated: 5 mL/min — ABNORMAL LOW (ref >60)
Glucose, Bld: 114 mg/dL — ABNORMAL HIGH (ref 70–99)
Potassium: 3.4 mmol/L — ABNORMAL LOW (ref 3.5–5.1)
Sodium: 137 mmol/L (ref 135–145)

## 2020-12-11 LAB — RENAL FUNCTION PANEL
Albumin: 2.3 g/dL — ABNORMAL LOW (ref 3.5–5.0)
Anion gap: 15 (ref 5–15)
BUN: 138 mg/dL — ABNORMAL HIGH (ref 8–23)
CO2: 22 mmol/L (ref 22–32)
Calcium: 7.1 mg/dL — ABNORMAL LOW (ref 8.9–10.3)
Chloride: 104 mmol/L (ref 98–111)
Creatinine, Ser: 8.28 mg/dL — ABNORMAL HIGH (ref 0.61–1.24)
GFR, Estimated: 6 mL/min — ABNORMAL LOW (ref >60)
Glucose, Bld: 143 mg/dL — ABNORMAL HIGH (ref 70–99)
Phosphorus: 8.2 mg/dL — ABNORMAL HIGH (ref 2.5–4.6)
Potassium: 3.2 mmol/L — ABNORMAL LOW (ref 3.5–5.1)
Sodium: 141 mmol/L (ref 135–145)

## 2020-12-11 LAB — CBC
HCT: 23.9 % — ABNORMAL LOW (ref 39.0–52.0)
Hemoglobin: 8.1 g/dL — ABNORMAL LOW (ref 13.0–17.0)
MCH: 30.6 pg (ref 26.0–34.0)
MCHC: 33.9 g/dL (ref 30.0–36.0)
MCV: 90.2 fL (ref 80.0–100.0)
Platelets: 169 10*3/uL (ref 150–400)
RBC: 2.65 MIL/uL — ABNORMAL LOW (ref 4.22–5.81)
RDW: 14.1 % (ref 11.5–15.5)
WBC: 9.9 10*3/uL (ref 4.0–10.5)
nRBC: 0 % (ref 0.0–0.2)

## 2020-12-11 LAB — FOLATE: Folate: 17.6 ng/mL (ref >5.9)

## 2020-12-11 LAB — FERRITIN: Ferritin: 307 ng/mL (ref 24–336)

## 2020-12-11 LAB — RETICULOCYTES
Immature Retic Fract: 7.9 % (ref 2.3–15.9)
RBC.: 2.74 MIL/uL — ABNORMAL LOW (ref 4.22–5.81)
Retic Count, Absolute: 40.8 10*3/uL (ref 19.0–186.0)
Retic Ct Pct: 1.5 % (ref 0.4–3.1)

## 2020-12-11 LAB — VITAMIN B12: Vitamin B-12: 2201 pg/mL — ABNORMAL HIGH (ref 180–914)

## 2020-12-11 LAB — CREATININE, URINE, RANDOM: Creatinine, Urine: 57.62 mg/dL

## 2020-12-11 LAB — PROCALCITONIN
Procalcitonin: 0.19 ng/mL
Procalcitonin: 0.19 ng/mL

## 2020-12-11 MED ORDER — MIDODRINE HCL 5 MG PO TABS
10.0000 mg | ORAL_TABLET | Freq: Three times a day (TID) | ORAL | Status: DC
  Administered 2020-12-11 – 2020-12-15 (×10): 10 mg via ORAL
  Filled 2020-12-11 (×10): qty 2

## 2020-12-11 MED ORDER — CHLORHEXIDINE GLUCONATE CLOTH 2 % EX PADS
6.0000 | MEDICATED_PAD | Freq: Every day | CUTANEOUS | Status: DC
  Administered 2020-12-12 – 2021-01-10 (×28): 6 via TOPICAL

## 2020-12-11 MED ORDER — SODIUM BICARBONATE 8.4 % IV SOLN
100.0000 meq | Freq: Once | INTRAVENOUS | Status: AC
  Administered 2020-12-11: 100 meq via INTRAVENOUS
  Filled 2020-12-11: qty 100

## 2020-12-11 MED ORDER — ASPIRIN EC 81 MG PO TBEC
81.0000 mg | DELAYED_RELEASE_TABLET | Freq: Every day | ORAL | Status: DC
  Administered 2020-12-11 – 2020-12-12 (×2): 81 mg via ORAL
  Filled 2020-12-11 (×2): qty 1

## 2020-12-11 MED ORDER — ATORVASTATIN CALCIUM 10 MG PO TABS
20.0000 mg | ORAL_TABLET | Freq: Every day | ORAL | Status: DC
  Administered 2020-12-11 – 2021-01-10 (×30): 20 mg via ORAL
  Filled 2020-12-11 (×31): qty 2

## 2020-12-11 NOTE — Progress Notes (Signed)
PROGRESS NOTE    Andrew [name]  EVO:[id number] DOB: Nov 12, 1931 DOA: 12/10/2020 PCP: [name], MD    Brief Narrative:  87 y.o. male with medical history significant for thoracic aortic aneurysm status post stent graft repair, CABG in 2010, AAA, chronic diastolic CHF, BPH, urinary retention, CKD II, and remote atrial fibrillation not anticoagulated, now presenting to the emergency department for evaluation of abnormal blood work.  Patient reports that he has had a productive cough and mild malaise for roughly 2 weeks that did not improve with a course of Augmentin that he just completed.  He followed up with PCP, blood work was obtained, and he was directed to the ED for further evaluation of abnormal results.  Patient reports a loss of appetite for at least a week but denies any nausea, vomiting, diarrhea, abdominal pain, flank pain, or change in his urine.  Patient reports that his chronic bilateral ankle swelling is unchanged and notes that he does not take his Lasix and potassium supplement regularly.  CT the chest, abdomen, and pelvis is notable for new left upper lobe infiltrate, unchanged stent graft repair of thoracic aortic aneurysm, and grossly stable infrarenal AAA.  Patient was treated with Rocephin, azithromycin, albuterol, and IV fluids in the ED  Assessment & Plan:   Principal Problem:   AKI (acute kidney injury) ([place]) Active Problems:   Thoracic aortic aneurysm without rupture ([place])   Coronary artery disease   Urinary retention   AAA (abdominal aortic aneurysm) without rupture ([place])   Essential hypertension   Normocytic anemia   Chronic diastolic CHF (congestive heart failure) ([place])   Community acquired pneumonia  1. Acute renal failure superimposed on CKD II; uremia; metabolic acidosis   - Presented after evaluation of renal failure on outpatient blood work and is found to have BUN of 161 and SCr 8.71 (44 & 1.09 in August '21) with serum bicarb of 17 and normal  potassium  - Kidneys appear normal with no obstruction on CT in ED - He has had decreased appetite recently but no N/V/D; recently completed a course of Augmentin, denies any other new medications  - Have consulted Nephrology, appreciate input, recommendation for gentle IVF hydration, however there is concern of possibly needing HD if renal function does not improve -Appreciate input by Palliative Care who established goals of care. Pt's wishes are for full scope of care, including HD if needed  2. Pneumonia  - Continues to have productive cough despite recent outpatient antibiotics  - New LUL infiltrate noted on CT in ED  - Pt is now continued on Rocephin and azithromycin - Procalcitonin of 0.19, suggesting discontinuation of abx soon  3. CAD  - No anginal complaints  - Continue ASA and Lipitor as tolerated  4. Chronic diastolic CHF  - Appears compensated  - Hold Lasix while hydrating, monitor weight and I/Os     5. Thoracic aortic aneurysm; AAA - Appears stable on CT in ED  - Would follow up with  vascular surgery follow-up on discharge   6. Anemia  - Hgb is 9.4, down from 11.7 in March 2021  - Recheck cbc in AM  DVT prophylaxis: heparin subq Code Status: Full Family Communication: Pt in room, family is at bedside  Status is: Inpatient  Remains inpatient appropriate because:Inpatient level of care appropriate due to severity of illness   Dispo: The patient is from: Home              Anticipated d/c is  to: Home              Patient currently is not medically stable to d/c.   Difficult to place patient No       Consultants:   Nephrology  Procedures:     Antimicrobials: Anti-infectives (From admission, onward)   Start     Dose/Rate Route Frequency Ordered Stop   12/11/20 2000  cefTRIAXone (ROCEPHIN) 2 g in sodium chloride 0.9 % 100 mL IVPB        2 g 200 mL/hr over 30 Minutes Intravenous Every 24 hours 12/10/20 2308 12/15/20 1959   12/11/20 2000   azithromycin (ZITHROMAX) 500 mg in sodium chloride 0.9 % 250 mL IVPB        500 mg 250 mL/hr over 60 Minutes Intravenous Every 24 hours 12/10/20 2308 12/15/20 1959   12/10/20 1945  cefTRIAXone (ROCEPHIN) 1 g in sodium chloride 0.9 % 100 mL IVPB        1 g 200 mL/hr over 30 Minutes Intravenous  Once 12/10/20 1940 12/10/20 2037   12/10/20 1945  azithromycin (ZITHROMAX) 500 mg in sodium chloride 0.9 % 250 mL IVPB        500 mg 250 mL/hr over 60 Minutes Intravenous  Once 12/10/20 1940 12/10/20 2142       Subjective: Denies chest pain or sob  Objective: Vitals:   12/11/20 0500 12/11/20 0746 12/11/20 1245 12/11/20 1541  BP:  100/64 (!) 86/54 (!) 113/51  Pulse:  73 80 74  Resp:  15 15 13   Temp:  98.4 F (36.9 C)  98.2 F (36.8 C)  TempSrc:  Oral  Oral  SpO2:  93% 91% 100%  Weight: 69.7 kg     Height:        Intake/Output Summary (Last 24 hours) at 12/11/2020 1556 Last data filed at 12/11/2020 1200 Gross per 24 hour  Intake 1195.02 ml  Output 1100 ml  Net 95.02 ml   Filed Weights   12/10/20 1551 12/11/20 0500  Weight: 65.3 kg 69.7 kg    Examination: General exam: Awake, laying in bed, in nad Respiratory system: Normal respiratory effort, no wheezing Cardiovascular system: regular rate, s1, s2 Gastrointestinal system: Soft, nondistended, positive BS Central nervous system: CN2-12 grossly intact, strength intact Extremities: Perfused, no clubbing Skin: Normal skin turgor, no notable skin lesions seen Psychiatry: Mood normal // no visual hallucinations   Data Reviewed: I have personally reviewed following labs and imaging studies  CBC: Recent Labs  Lab 12/10/20 1713 12/10/20 2323  WBC 11.8* 9.9  NEUTROABS 10.0*  --   HGB 9.4* 8.1*  HCT 28.4* 23.9*  MCV 90.2 90.2  PLT 169 481   Basic Metabolic Panel: Recent Labs  Lab 12/10/20 1713 12/10/20 2323 12/11/20 1209  NA 139 137 141  K 3.8 3.4* 3.2*  CL 99 101 104  CO2 17* 17* 22  GLUCOSE 118* 114* 143*  BUN  161* 150* 138*  CREATININE 8.71* 9.32* 8.28*  CALCIUM 7.3* 7.1* 7.1*  PHOS  --   --  8.2*   GFR: Estimated Creatinine Clearance: 6.2 mL/min (A) (by C-G formula based on SCr of 8.28 mg/dL (H)). Liver Function Tests: Recent Labs  Lab 12/10/20 1713 12/11/20 1209  AST 12*  --   ALT 9  --   ALKPHOS 66  --   BILITOT 0.3  --   PROT 7.3  --   ALBUMIN 3.5 2.3*   No results for input(s): LIPASE, AMYLASE in the last 168 hours. No  results for input(s): AMMONIA in the last 168 hours. Coagulation Profile: No results for input(s): INR, PROTIME in the last 168 hours. Cardiac Enzymes: Recent Labs  Lab 12/10/20 1852  CKTOTAL 66   BNP (last 3 results) Recent Labs    01/09/20 1443  PROBNP 1,018*   HbA1C: No results for input(s): HGBA1C in the last 72 hours. CBG: No results for input(s): GLUCAP in the last 168 hours. Lipid Profile: No results for input(s): CHOL, HDL, LDLCALC, TRIG, CHOLHDL, LDLDIRECT in the last 72 hours. Thyroid Function Tests: No results for input(s): TSH, T4TOTAL, FREET4, T3FREE, THYROIDAB in the last 72 hours. Anemia Panel: Recent Labs    12/10/20 2323  VITAMINB12 2,201*  FOLATE 17.6  FERRITIN 307  TIBC 232*  IRON 26*  RETICCTPCT 1.5   Sepsis Labs: Recent Labs  Lab 12/10/20 2323  PROCALCITON 0.19  0.19    Recent Results (from the past 240 hour(s))  Resp Panel by RT-PCR (Flu A&B, Covid) Nasopharyngeal Swab     Status: None   Collection Time: 12/10/20  5:13 PM   Specimen: Nasopharyngeal Swab; Nasopharyngeal(NP) swabs in vial transport medium  Result Value Ref Range Status   SARS Coronavirus 2 by RT PCR NEGATIVE NEGATIVE Final    Comment: (NOTE) SARS-CoV-2 target nucleic acids are NOT DETECTED.  The SARS-CoV-2 RNA is generally detectable in upper respiratory specimens during the acute phase of infection. The lowest concentration of SARS-CoV-2 viral copies this assay can detect is 138 copies/mL. A negative result does not preclude  SARS-Cov-2 infection and should not be used as the sole basis for treatment or other patient management decisions. A negative result may occur with  improper specimen collection/handling, submission of specimen other than nasopharyngeal swab, presence of viral mutation(s) within the areas targeted by this assay, and inadequate number of viral copies(<138 copies/mL). A negative result must be combined with clinical observations, patient history, and epidemiological information. The expected result is Negative.  Fact Sheet for Patients:  EntrepreneurPulse.com.au  Fact Sheet for Healthcare Providers:  IncredibleEmployment.be  This test is no t yet approved or cleared by the [place] FDA and  has been authorized for detection and/or diagnosis of SARS-CoV-2 by FDA under an Emergency Use Authorization (EUA). This EUA will remain  in effect (meaning this test can be used) for the duration of the COVID-19 declaration under Section 564(b)(1) of the Act, 21 U.S.C.section 360bbb-3(b)(1), unless the authorization is terminated  or revoked sooner.       Influenza A by PCR NEGATIVE NEGATIVE Final   Influenza B by PCR NEGATIVE NEGATIVE Final    Comment: (NOTE) The Xpert Xpress SARS-CoV-2/FLU/RSV plus assay is intended as an aid in the diagnosis of influenza from Nasopharyngeal swab specimens and should not be used as a sole basis for treatment. Nasal washings and aspirates are unacceptable for Xpert Xpress SARS-CoV-2/FLU/RSV testing.  Fact Sheet for Patients: EntrepreneurPulse.com.au  Fact Sheet for Healthcare Providers: IncredibleEmployment.be  This test is not yet approved or cleared by the [place] FDA and has been authorized for detection and/or diagnosis of SARS-CoV-2 by FDA under an Emergency Use Authorization (EUA). This EUA will remain in effect (meaning this test can be used) for the duration of  the COVID-19 declaration under Section 564(b)(1) of the Act, 21 U.S.C. section 360bbb-3(b)(1), unless the authorization is terminated or revoked.  Performed at [facility], [address] [place], [place] [address]      Radiology Studies: CT ABDOMEN PELVIS WO CONTRAST  Result Date: 12/10/2020  CLINICAL DATA:  Hematuria, cough.  Shortness of breath. EXAM: CT CHEST, ABDOMEN AND PELVIS WITHOUT CONTRAST TECHNIQUE: Multidetector CT imaging of the chest, abdomen and pelvis was performed following the standard protocol without IV contrast. COMPARISON:  July 01, 2020. FINDINGS: CT CHEST FINDINGS Cardiovascular: Status post stent graft repair of transverse aortic arch and descending thoracic aorta. Status post coronary bypass graft. Normal cardiac size. No pericardial effusion. Stable aneurysmal dilatation of distal descending thoracic aorta measuring 5.5 cm. Mediastinum/Nodes: No enlarged mediastinal, hilar, or axillary lymph nodes. Thyroid gland, trachea, and esophagus demonstrate no significant findings. Lungs/Pleura: No pneumothorax is noted. Stable probable scarring is noted in the right lower lobe. New airspace opacity is noted in left upper lobe concerning for pneumonia. Musculoskeletal: No chest wall mass or suspicious bone lesions identified. CT ABDOMEN PELVIS FINDINGS Hepatobiliary: No focal liver abnormality is seen. No gallstones, gallbladder wall thickening, or biliary dilatation. Pancreas: Unremarkable. No pancreatic ductal dilatation or surrounding inflammatory changes. Spleen: Normal in size without focal abnormality. Adrenals/Urinary Tract: Adrenal glands are unremarkable. Kidneys are normal, without renal calculi, focal lesion, or hydronephrosis. Bladder is unremarkable. Stomach/Bowel: The stomach appears normal. There is no evidence of bowel obstruction or inflammation. Vascular/Lymphatic: Aneurysmal dilatation of abdominal aorta is noted, with maximum measured diameter  5.1 cm which is not significantly changed compared to prior exam. No adenopathy is noted. Reproductive: Prostate is unremarkable. Other: No abdominal wall hernia or abnormality. No abdominopelvic ascites. Musculoskeletal: No acute or significant osseous findings. IMPRESSION: New left upper lobe airspace opacity is noted concerning for possible pneumonia. Status post stent graft repair of descending thoracic aortic aneurysm, which has a maximum measured diameter 5.5 cm distally which is unchanged compared to prior exam. Grossly stable 5.1 cm infrarenal abdominal aortic aneurysm is noted. Recommend follow-up CT/MR every 6 months and vascular consultation. This recommendation follows ACR consensus guidelines: White Paper of the ACR Incidental Findings Committee II on Vascular Findings. J Am Coll Radiol 2013; 10:789-794. Aortic Atherosclerosis (ICD10-I70.0). Electronically Signed   By: Marijo Conception M.D.   On: 12/10/2020 19:34   CT Chest Wo Contrast  Result Date: 12/10/2020 CLINICAL DATA:  Hematuria, cough.  Shortness of breath. EXAM: CT CHEST, ABDOMEN AND PELVIS WITHOUT CONTRAST TECHNIQUE: Multidetector CT imaging of the chest, abdomen and pelvis was performed following the standard protocol without IV contrast. COMPARISON:  July 01, 2020. FINDINGS: CT CHEST FINDINGS Cardiovascular: Status post stent graft repair of transverse aortic arch and descending thoracic aorta. Status post coronary bypass graft. Normal cardiac size. No pericardial effusion. Stable aneurysmal dilatation of distal descending thoracic aorta measuring 5.5 cm. Mediastinum/Nodes: No enlarged mediastinal, hilar, or axillary lymph nodes. Thyroid gland, trachea, and esophagus demonstrate no significant findings. Lungs/Pleura: No pneumothorax is noted. Stable probable scarring is noted in the right lower lobe. New airspace opacity is noted in left upper lobe concerning for pneumonia. Musculoskeletal: No chest wall mass or suspicious bone lesions  identified. CT ABDOMEN PELVIS FINDINGS Hepatobiliary: No focal liver abnormality is seen. No gallstones, gallbladder wall thickening, or biliary dilatation. Pancreas: Unremarkable. No pancreatic ductal dilatation or surrounding inflammatory changes. Spleen: Normal in size without focal abnormality. Adrenals/Urinary Tract: Adrenal glands are unremarkable. Kidneys are normal, without renal calculi, focal lesion, or hydronephrosis. Bladder is unremarkable. Stomach/Bowel: The stomach appears normal. There is no evidence of bowel obstruction or inflammation. Vascular/Lymphatic: Aneurysmal dilatation of abdominal aorta is noted, with maximum measured diameter 5.1 cm which is not significantly changed compared to prior exam. No adenopathy is noted. Reproductive: Prostate is  unremarkable. Other: No abdominal wall hernia or abnormality. No abdominopelvic ascites. Musculoskeletal: No acute or significant osseous findings. IMPRESSION: New left upper lobe airspace opacity is noted concerning for possible pneumonia. Status post stent graft repair of descending thoracic aortic aneurysm, which has a maximum measured diameter 5.5 cm distally which is unchanged compared to prior exam. Grossly stable 5.1 cm infrarenal abdominal aortic aneurysm is noted. Recommend follow-up CT/MR every 6 months and vascular consultation. This recommendation follows ACR consensus guidelines: White Paper of the ACR Incidental Findings Committee II on Vascular Findings. J Am Coll Radiol 2013; 10:789-794. Aortic Atherosclerosis (ICD10-I70.0). Electronically Signed   By: Marijo Conception M.D.   On: 12/10/2020 19:34   DG Chest Port 1 View  Result Date: 12/10/2020 CLINICAL DATA:  Cough and shortness of breath for 2 weeks EXAM: PORTABLE CHEST 1 VIEW COMPARISON:  07/01/2020 FINDINGS: Cardiac shadow is enlarged but stable. Diffuse aortic stent graft is noted in the descending thoracic aorta. Postsurgical changes are again noted and stable. Chronic pleural  thickening laterally in the right lung base is noted. No focal infiltrate or sizable effusion is seen. No bony abnormality is seen. Stable calcified pleural plaques are noted. IMPRESSION: Stable appearance of the chest as described. Electronically Signed   By: Inez Catalina M.D.   On: 12/10/2020 16:58    Scheduled Meds: . aspirin EC  81 mg Oral Daily  . atorvastatin  20 mg Oral Daily  . heparin  5,000 Units Subcutaneous Q8H  . midodrine  10 mg Oral TID WC   Continuous Infusions: . azithromycin    . cefTRIAXone (ROCEPHIN)  IV       LOS: 1 day   Marylu Lund, MD [facility] Pager On Amion  If 7PM-7AM, please contact night-coverage 12/11/2020, 3:56 PM

## 2020-12-11 NOTE — Consult Note (Signed)
[facility]rology Consultation Note  Requesting MD: Dr Marylu Lund Reason for consult: AKI  HPI:  Andrew [name] is a 87 y.o. male with history of hypertension, AAA, CAD status post CABG, BPH with urinary retention requiring intermittent self-catheterization, A. fib, sent by PCP to ER for abnormal lab results seen as a consultation for the evaluation of acute kidney injury. Patient and his wife reported that he was having productive cough, generalized weakness for the last few weeks.  He was treated with oral Augmentin for possible pneumonia/bronchitis.  He went to PCP for regular lab check when he was advised to go to ER because of abnormal result.  Patient said recently he has poor appetite associated with some nausea and overall not feeling well.  He denied vomiting, chest pain, shortness of breath.  He does have chronic bilateral ankle edema which per him is not changed.  He takes diltiazem, furosemide 80 mg daily and potassium chloride.  Denies use of NSAIDs however it is listed in the home medication list. On arrival to the ER, his blood pressure was acceptable however systolic blood pressure dropped to 70s last night.  The creatinine level was 8.71 on admission compared to 1.09 in 02/2020.  He remained hypotensive with serum creatinine level elevated further to 9.32 today.  The CT scan showed unremarkable kidneys without hydronephrosis.  The urinalysis bland with very minimal protein.  The urine output is documented 500 cc. Per patient he has now decreased urine output and self-catheterization.  Denies diarrhea. Currently he is on bed with complaining of ongoing cough.  Antihypertensives were held.  He is receiving gentle IV hydration.  Received azithromycin and ceftriaxone for possible pneumonia.  He is on room air.  Creat  Date/Time Value Ref Range Status  08/19/2019 01:58 PM 1.32 (H) 0.70 - 1.11 mg/dL Final    Comment:    For patients >29 years of age, the reference  limit for Creatinine is approximately 13% higher for people identified as African-American. .    Creatinine, Ser  Date/Time Value Ref Range Status  12/10/2020 11:23 PM 9.32 (H) 0.61 - 1.24 mg/dL Final  12/10/2020 05:13 PM 8.71 (H) 0.61 - 1.24 mg/dL Final  03/18/2020 02:56 PM 1.09 0.76 - 1.27 mg/dL Final  01/29/2020 01:01 PM 1.24 0.76 - 1.27 mg/dL Final  01/16/2020 11:31 AM 1.35 (H) 0.76 - 1.27 mg/dL Final  01/09/2020 02:43 PM 1.37 (H) 0.76 - 1.27 mg/dL Final  09/27/2019 05:39 AM 1.01 0.61 - 1.24 mg/dL Final    PMHx:   Past Medical History:  Diagnosis Date  . AAA (abdominal aortic aneurysm) ([place])   . Angioedema 06/01/2015  . Arthritis   . Back pain   . Cancer ([place])    squamous cell skin cancer carcinomas removed from hand and face  . Chronic kidney disease    ? bph, pt self catheterizes himself on schedule  . Complication of anesthesia    had to have Foley post op cabg and lumb lam  . Coronary artery disease    s/p remote PCI // s/p CABG in 2010 Echo 01/2019: EF 60-65, Gr 1 DD, normal RVSF, RVSP 36, mild LAE, mild to mod TR  . Hypertension   . PVC's (premature ventricular contractions)    Monitor 12/2018:  Sinus rhythm with frequent PVC's (11.5%) and periods of bigeminy.   . Thoracic aortic aneurysm ([place])    09/29/16 CT: 4.3 cm ascending, 4.5 cm descending. 1 yr f/u rec // s/p endovascular repair 09/2019 (  Dr. Carlis Abbott)  . Urinary retention   . Wears glasses     Past Surgical History:  Procedure Laterality Date  . BACK SURGERY  2012   lumb lam  . CARDIAC CATHETERIZATION     2010-per patient  . COLONOSCOPY    . CORONARY ARTERY BYPASS GRAFT  2010   triple  . EPIGASTRIC HERNIA REPAIR N/A 09/11/2017   Procedure: HERNIA REPAIR EPIGASTRIC ADULT;  Surgeon: Rolm Bookbinder, MD;  Location: [place];  Service: General;  Laterality: N/A;  . HERNIA REPAIR     rih 1981  . HERNIA REPAIR  09/11/2017   with mesh  . INGUINAL HERNIA REPAIR  07/30/2012   Procedure: HERNIA REPAIR INGUINAL  ADULT;  Surgeon: Rolm Bookbinder, MD;  Location: [facility];  Service: General;  Laterality: Left;  Left Hernia Site  . INSERTION OF MESH  07/30/2012   Procedure: INSERTION OF MESH;  Surgeon: Rolm Bookbinder, MD;  Location: [facility];  Service: General;  Laterality: Left;  Left Inguinal Hernia  . INSERTION OF MESH N/A 09/11/2017   Procedure: INSERTION OF MESH;  Surgeon: Rolm Bookbinder, MD;  Location: [facility];  Service: General;  Laterality: N/A;  . LAPAROSCOPY N/A 09/11/2017   Procedure: DIAGNOSTIC LAPAOSCOPY;  Surgeon: Rolm Bookbinder, MD;  Location: [facility];  Service: General;  Laterality: N/A;  ERAS pathway  . SPINE SURGERY     lumbar laminectomy  . THORACIC AORTIC ENDOVASCULAR STENT GRAFT N/A 09/22/2019   Procedure: THORACIC AORTIC ENDOVASCULAR STENT GRAFT;  Surgeon: Marty Heck, MD;  Location: [facility] OR;  Service: Vascular;  Laterality: N/A;  . TONSILLECTOMY      Family Hx:  Family History  Problem Relation Age of Onset  . Allergic rhinitis Son   . Coronary artery disease Mother   . Angioedema Neg Hx   . Asthma Neg Hx   . Atopy Neg Hx   . Eczema Neg Hx   . Immunodeficiency Neg Hx   . Urticaria Neg Hx     Social History:  reports that he quit smoking about 36 years ago. He has a 8.00 pack-year smoking history. He has never used smokeless tobacco. He reports that he does not drink alcohol and does not use drugs.  Allergies:  Allergies  Allergen Reactions  . Quinolones Other (See Comments)    Patient has an abdominal aortic aneurysm     Medications: Prior to Admission medications   Medication Sig Start Date End Date Taking? Authorizing Provider  aspirin 81 MG tablet Take 81 mg by mouth daily after supper.    Yes [provider]  atorvastatin (LIPITOR) 20 MG tablet TAKE 1 TABLET ONCE DAILY AT 6 PM. 09/20/20  Yes Hilty, Nadean Corwin, MD  Cholecalciferol (VITAMIN D3) 1000 units CAPS Take 1,000 Units by mouth daily.   Yes [provider]  Co-Enzyme Q-10 100 MG CAPS Take 100 mg by mouth daily.    Yes [provider]  CRANBERRY EXTRACT PO Take 1 tablet by mouth daily.   Yes [provider]  diltiazem (DILACOR XR) 120 MG 24 hr capsule TAKE 1 CAPSULE DAILY. 09/20/20  Yes Hilty, Nadean Corwin, MD  ferrous sulfate 325 (65 FE) MG tablet Take 325 mg by mouth daily with breakfast.   Yes [provider]  furosemide (LASIX) 80 MG tablet Take 1 tablet (80 mg total) by mouth daily. 01/22/20  Yes Almyra Deforest, PA  gabapentin (NEURONTIN) 300 MG capsule Take 300 mg by mouth at bedtime.  04/18/12  Yes [provider]  ibuprofen (ADVIL) 200 MG tablet Take 200-400 mg by mouth every 6 (six) hours as needed (for pain).   Yes [provider]  loratadine (CLARITIN) 10 MG tablet Take 10 mg by mouth daily as needed for allergies or rhinitis.   Yes [provider]  Multiple Vitamins-Minerals (PRESERVISION AREDS 2 PO) Take 1 capsule by mouth 2 (two) times daily.   Yes [provider]  nitrofurantoin, macrocrystal-monohydrate, (MACROBID) 100 MG capsule Take by mouth. 04/15/20  Yes [provider]  nitroGLYCERIN (NITRODUR - DOSED IN MG/24 HR) 0.2 mg/hr patch Use 1/2 patch daily to the affected area 10/11/18  Yes Hudnall, Azucena Fallen, MD  oxymetazoline (AFRIN) 0.05 % nasal spray Place 1 spray into both nostrils at bedtime as needed for congestion.   Yes [provider]  pantoprazole (PROTONIX) 40 MG tablet Take 40 mg by mouth daily. 12/03/19  Yes [provider]  potassium chloride 20 MEQ TBCR Take 20 mEq by mouth daily. 01/22/20  Yes Meng, Wynema Birch, PA  potassium chloride SA (KLOR-CON) 20 MEQ tablet 1 tablet with food 02/04/20  Yes [provider]  sodium chloride (MURO 128) 5 % ophthalmic ointment Place 1 application into both eyes at bedtime.   Yes [provider]  triamcinolone (NASACORT ALLERGY 24HR) 55 MCG/ACT AERO nasal inhaler Place 2 sprays into the nose  daily as needed (for allergies).    Yes [provider]  TURMERIC PO Take 1 capsule by mouth daily after breakfast.   Yes [provider]  vitamin B-12 (CYANOCOBALAMIN) 1000 MCG tablet Take 1,000 mcg by mouth daily.    Yes [provider]  vitamin C (ASCORBIC ACID) 250 MG tablet Take 250 mg by mouth daily.    Yes [provider]  isosorbide mononitrate (IMDUR) 30 MG 24 hr tablet Take 0.5 tablets (15 mg total) by mouth daily. 09/27/19 01/22/20  Hughie Closs, MD  nitroGLYCERIN (NITROSTAT) 0.6 MG SL tablet Place 0.6 mg under the tongue every 5 (five) minutes as needed for chest pain.    [provider]    I have reviewed the patient's current medications.  Labs:  Results for orders placed or performed during the hospital encounter of 12/10/20 (from the past 48 hour(s))  Comprehensive metabolic panel     Status: Abnormal   Collection Time: 12/10/20  5:13 PM  Result Value Ref Range   Sodium 139 135 - 145 mmol/L   Potassium 3.8 3.5 - 5.1 mmol/L   Chloride 99 98 - 111 mmol/L   CO2 17 (L) 22 - 32 mmol/L   Glucose, Bld 118 (H) 70 - 99 mg/dL    Comment: Glucose reference range applies only to samples taken after fasting for at least 8 hours.   BUN 161 (H) 8 - 23 mg/dL    Comment: RESULT CONFIRMED BY AUTOMATED DILUTION REPEATED TO VERIFY    Creatinine, Ser 8.71 (H) 0.61 - 1.24 mg/dL   Calcium 7.3 (L) 8.9 - 10.3 mg/dL   Total Protein 7.3 6.5 - 8.1 g/dL   Albumin 3.5 3.5 - 5.0 g/dL   AST 12 (L) 15 - 41 U/L   ALT 9 0 - 44 U/L   Alkaline Phosphatase 66 38 - 126 U/L   Total Bilirubin 0.3 0.3 - 1.2 mg/dL   GFR, Estimated 5 (L) >60 mL/min    Comment: (NOTE) Calculated using the CKD-EPI Creatinine Equation (2021)    Anion gap 23 (H) 5 - 15  Comment: Performed at [facility], [address], [place], [place] [address]  CBC with Differential     Status: Abnormal   Collection Time: 12/10/20  5:13 PM  Result Value Ref Range   WBC 11.8  (H) 4.0 - 10.5 K/uL   RBC 3.15 (L) 4.22 - 5.81 MIL/uL   Hemoglobin 9.4 (L) 13.0 - 17.0 g/dL   HCT 28.4 (L) 39.0 - 52.0 %   MCV 90.2 80.0 - 100.0 fL   MCH 29.8 26.0 - 34.0 pg   MCHC 33.1 30.0 - 36.0 g/dL   RDW 14.3 11.5 - 15.5 %   Platelets 169 150 - 400 K/uL   nRBC 0.0 0.0 - 0.2 %   Neutrophils Relative % 85 %   Neutro Abs 10.0 (H) 1.7 - 7.7 K/uL   Lymphocytes Relative 2 %   Lymphs Abs 0.2 (L) 0.7 - 4.0 K/uL   Monocytes Relative 7 %   Monocytes Absolute 0.8 0.1 - 1.0 K/uL   Eosinophils Relative 4 %   Eosinophils Absolute 0.5 0.0 - 0.5 K/uL   Basophils Relative 0 %   Basophils Absolute 0.1 0.0 - 0.1 K/uL   Immature Granulocytes 2 %   Abs Immature Granulocytes 0.18 (H) 0.00 - 0.07 K/uL    Comment: Performed at [facility], [place], [place] [address]  Urinalysis, Routine w reflex microscopic Urine, Catheterized     Status: Abnormal   Collection Time: 12/10/20  5:13 PM  Result Value Ref Range   Color, Urine STRAW (A) YELLOW   APPearance CLEAR CLEAR   Specific Gravity, Urine 1.015 1.005 - 1.030   pH 5.5 5.0 - 8.0   Glucose, UA NEGATIVE NEGATIVE mg/dL   Hgb urine dipstick MODERATE (A) NEGATIVE   Bilirubin Urine NEGATIVE NEGATIVE   Ketones, ur NEGATIVE NEGATIVE mg/dL   Protein, ur 30 (A) NEGATIVE mg/dL   Nitrite NEGATIVE NEGATIVE   Leukocytes,Ua NEGATIVE NEGATIVE    Comment: Performed at [facility], [address], [place],  [address]  Resp Panel by RT-PCR (Flu A&B, Covid) Nasopharyngeal Swab     Status: None   Collection Time: 12/10/20  5:13 PM   Specimen: Nasopharyngeal Swab; Nasopharyngeal(NP) swabs in vial transport medium  Result Value Ref Range   SARS Coronavirus 2 by RT PCR NEGATIVE NEGATIVE    Comment: (NOTE) SARS-CoV-2 target nucleic acids are NOT DETECTED.  The SARS-CoV-2 RNA is generally detectable in upper respiratory specimens during the acute phase of infection. The lowest concentration of  SARS-CoV-2 viral copies this assay can detect is 138 copies/mL. A negative result does not preclude SARS-Cov-2 infection and should not be used as the sole basis for treatment or other patient management decisions. A negative result may occur with  improper specimen collection/handling, submission of specimen other than nasopharyngeal swab, presence of viral mutation(s) within the areas targeted by this assay, and inadequate number of viral copies(<138 copies/mL). A negative result must be combined with clinical observations, patient history, and epidemiological information. The expected result is Negative.  Fact Sheet for Patients:  EntrepreneurPulse.com.au  Fact Sheet for Healthcare Providers:  IncredibleEmployment.be  This test is no t yet approved or cleared by the [place] FDA and  has been authorized for detection and/or diagnosis of SARS-CoV-2 by FDA under an Emergency Use Authorization (EUA). This EUA will remain  in effect (meaning this test can be used) for the duration of the COVID-19 declaration under Section 564(b)(1) of the Act, 21 U.S.C.section 360bbb-3(b)(1), unless  the authorization is terminated  or revoked sooner.       Influenza A by PCR NEGATIVE NEGATIVE   Influenza B by PCR NEGATIVE NEGATIVE    Comment: (NOTE) The Xpert Xpress SARS-CoV-2/FLU/RSV plus assay is intended as an aid in the diagnosis of influenza from Nasopharyngeal swab specimens and should not be used as a sole basis for treatment. Nasal washings and aspirates are unacceptable for Xpert Xpress SARS-CoV-2/FLU/RSV testing.  Fact Sheet for Patients: EntrepreneurPulse.com.au  Fact Sheet for Healthcare Providers: IncredibleEmployment.be  This test is not yet approved or cleared by the [place] FDA and has been authorized for detection and/or diagnosis of SARS-CoV-2 by FDA under an Emergency Use Authorization (EUA).  This EUA will remain in effect (meaning this test can be used) for the duration of the COVID-19 declaration under Section 564(b)(1) of the Act, 21 U.S.C. section 360bbb-3(b)(1), unless the authorization is terminated or revoked.  Performed at [facility], [address], [place], [place] [address]   Urinalysis, Microscopic (reflex)     Status: Abnormal   Collection Time: 12/10/20  5:13 PM  Result Value Ref Range   RBC / HPF 0-5 0 - 5 RBC/hpf   WBC, UA 0-5 0 - 5 WBC/hpf   Bacteria, UA RARE (A) NONE SEEN   Squamous Epithelial / LPF NONE SEEN 0 - 5   Mucus PRESENT    Amorphous Crystal PRESENT     Comment: Performed at [facility], [address] [place], [place] [address]  CK     Status: None   Collection Time: 12/10/20  6:52 PM  Result Value Ref Range   Total CK 66 49 - 397 U/L    Comment: Performed at [facility], [address] [place], [place] [address]  Basic metabolic panel     Status: Abnormal   Collection Time: 12/10/20 11:23 PM  Result Value Ref Range   Sodium 137 135 - 145 mmol/L   Potassium 3.4 (L) 3.5 - 5.1 mmol/L   Chloride 101 98 - 111 mmol/L   CO2 17 (L) 22 - 32 mmol/L   Glucose, Bld 114 (H) 70 - 99 mg/dL    Comment: Glucose reference range applies only to samples taken after fasting for at least 8 hours.   BUN 150 (H) 8 - 23 mg/dL   Creatinine, Ser 9.32 (H) 0.61 - 1.24 mg/dL   Calcium 7.1 (L) 8.9 - 10.3 mg/dL   GFR, Estimated 5 (L) >60 mL/min    Comment: (NOTE) Calculated using the CKD-EPI Creatinine Equation (2021)    Anion gap 19 (H) 5 - 15    Comment: Performed at [facility] [address] [place], [place] [address]  CBC     Status: Abnormal   Collection Time: 12/10/20 11:23 PM  Result Value Ref Range   WBC 9.9 4.0 - 10.5 K/uL   RBC 2.65 (L) 4.22 - 5.81 MIL/uL   Hemoglobin 8.1 (L) 13.0 - 17.0 g/dL   HCT 23.9 (L) 39.0 - 52.0 %   MCV 90.2 80.0 - 100.0 fL   MCH 30.6 26.0 - 34.0 pg    MCHC 33.9 30.0 - 36.0 g/dL   RDW 14.1 11.5 - 15.5 %   Platelets 169 150 - 400 K/uL   nRBC 0.0 0.0 - 0.2 %    Comment: Performed at [facility], [place] [address], [place], [place] [address]  Type and screen [place]     Status: None  Collection Time: 12/10/20 11:23 PM  Result Value Ref Range   ABO/RH(D) O POS    Antibody Screen NEG    Sample Expiration      12/13/2020,2359 Performed at [facility] [address] [place], [place] [address]   Vitamin B12     Status: Abnormal   Collection Time: 12/10/20 11:23 PM  Result Value Ref Range   Vitamin B-12 2,201 (H) 180 - 914 pg/mL    Comment: (NOTE) This assay is not validated for testing neonatal or myeloproliferative syndrome specimens for Vitamin B12 levels. Performed at [facility], [place] [address], [place], [place] [address]   Folate     Status: None   Collection Time: 12/10/20 11:23 PM  Result Value Ref Range   Folate 17.6 >5.9 ng/mL    Comment: Performed at [facility], [place] [address], [place], [place] [address]  Iron and TIBC     Status: Abnormal   Collection Time: 12/10/20 11:23 PM  Result Value Ref Range   Iron 26 (L) 45 - 182 ug/dL   TIBC 232 (L) 250 - 450 ug/dL   Saturation Ratios 11 (L) 17.9 - 39.5 %   UIBC 206 ug/dL    Comment: Performed at [facility], [place] [address], [place], [place] [address]  Ferritin     Status: None   Collection Time: 12/10/20 11:23 PM  Result Value Ref Range   Ferritin 307 24 - 336 ng/mL    Comment: Performed at [facility] [address] [place], [place] [address]  Reticulocytes     Status: Abnormal   Collection Time: 12/10/20 11:23 PM  Result Value Ref Range   Retic Ct Pct 1.5 0.4 - 3.1 %   RBC. 2.74 (L) 4.22 - 5.81 MIL/uL   Retic Count, Absolute 40.8 19.0 - 186.0 K/uL   Immature Retic Fract 7.9 2.3 - 15.9 %    Comment: Performed at [facility] [address] [place], [place] [address]  Procalcitonin - Baseline      Status: None   Collection Time: 12/10/20 11:23 PM  Result Value Ref Range   Procalcitonin 0.19 ng/mL    Comment:        Interpretation: PCT (Procalcitonin) <= 0.5 ng/mL: Systemic infection (sepsis) is not likely. Local bacterial infection is possible. (NOTE)       Sepsis PCT Algorithm           Lower Respiratory Tract                                      Infection PCT Algorithm    ----------------------------     ----------------------------         PCT < 0.25 ng/mL                PCT < 0.10 ng/mL          Strongly encourage             Strongly discourage   discontinuation of antibiotics    initiation of antibiotics    ----------------------------     -----------------------------       PCT 0.25 - 0.50 ng/mL            PCT 0.10 - 0.25 ng/mL               OR       >  80% decrease in PCT            Discourage initiation of                                            antibiotics      Encourage discontinuation           of antibiotics    ----------------------------     -----------------------------         PCT >= 0.50 ng/mL              PCT 0.26 - 0.50 ng/mL               AND        <80% decrease in PCT             Encourage initiation of                                             antibiotics       Encourage continuation           of antibiotics    ----------------------------     -----------------------------        PCT >= 0.50 ng/mL                  PCT > 0.50 ng/mL               AND         increase in PCT                  Strongly encourage                                      initiation of antibiotics    Strongly encourage escalation           of antibiotics                                     -----------------------------                                           PCT <= 0.25 ng/mL                                                 OR                                        > 80% decrease in PCT                                      Discontinue / Do not initiate  antibiotics  Performed at [facility], [place] [address], [place], [place] [address]   Procalcitonin     Status: None   Collection Time: 12/10/20 11:23 PM  Result Value Ref Range   Procalcitonin 0.19 ng/mL    Comment:        Interpretation: PCT (Procalcitonin) <= 0.5 ng/mL: Systemic infection (sepsis) is not likely. Local bacterial infection is possible. (NOTE)       Sepsis PCT Algorithm           Lower Respiratory Tract                                      Infection PCT Algorithm    ----------------------------     ----------------------------         PCT < 0.25 ng/mL                PCT < 0.10 ng/mL          Strongly encourage             Strongly discourage   discontinuation of antibiotics    initiation of antibiotics    ----------------------------     -----------------------------       PCT 0.25 - 0.50 ng/mL            PCT 0.10 - 0.25 ng/mL               OR       >80% decrease in PCT            Discourage initiation of                                            antibiotics      Encourage discontinuation           of antibiotics    ----------------------------     -----------------------------         PCT >= 0.50 ng/mL              PCT 0.26 - 0.50 ng/mL               AND        <80% decrease in PCT             Encourage initiation of                                             antibiotics       Encourage continuation           of antibiotics    ----------------------------     -----------------------------        PCT >= 0.50 ng/mL                  PCT > 0.50 ng/mL               AND         increase in PCT                  Strongly encourage  initiation of antibiotics    Strongly encourage escalation           of antibiotics                                     -----------------------------                                           PCT <= 0.25 ng/mL                                                  OR                                        > 80% decrease in PCT                                      Discontinue / Do not initiate                                             antibiotics  Performed at [facility], [address] [place], [place] [address]   Creatinine, urine, random     Status: None   Collection Time: 12/11/20  9:32 AM  Result Value Ref Range   Creatinine, Urine 57.62 mg/dL    Comment: Performed at [facility] [address], [place], [place] [address]  Sodium, urine, random     Status: None   Collection Time: 12/11/20  9:32 AM  Result Value Ref Range   Sodium, Ur 52 mmol/L    Comment: Performed at [facility] [address] [place], [place] [address]     ROS:  Pertinent items noted in HPI and remainder of comprehensive ROS otherwise negative.  Physical Exam: Vitals:   12/10/20 2247 12/11/20 0746  BP:  100/64  Pulse:  73  Resp:  15  Temp: 98.2 F (36.8 C) 98.4 F (36.9 C)  SpO2: 98% 93%     General exam: Elderly looking male, not in distress Respiratory system: Occasional basal rhonchi, no wheezing or respiratory distress.  Cardiovascular system: S1 & S2 heard, RRR.  Gastrointestinal system: Abdomen is nondistended, soft and nontender. Normal bowel sounds heard. Central nervous system: Alert and oriented. No focal neurological deficits. Extremities: Bilateral.  Ankle edema. Skin: No rashes, lesions or ulcers Psychiatry: Judgement and insight appear normal. Mood & affect appropriate.   Assessment/Plan:  #Acute kidney injury, nonoliguric likely ischemic ATN in the setting of hypotension, intravascular volume depletion due to Lasix and decreased oral intake.  Other possibility is AIN due to recent antibiotics use however urinalysis is bland with only minimal protein.  The CT scan ruled out obstruction. Patient still hypotensive, agree with holding antihypertensive including diltiazem, Lasix. Recommend Foley catheter insertion for  a strict ins and out, bladder scan No labs from this morning therefore  order renal panel. Agree with continuing gentle IV hydration, strict ins and out, daily lab. No urgent need for dialysis at this time.  Discussed with the patient and his wife about AKI and possible need for dialysis if no improvement.Recommend palliative care consult to address goals of care.  We will follow closely.  #Hypotension: Systolic blood pressure around 80s.  Off of antihypertensive and diuretics.  Recommend echocardiogram.  I will start oral midodrine as he may need pressors if remains hypotensive.  Further evaluation for infection per primary team.  #Community-acquired pneumonia: Productive cough for last few days.  Received Rocephin and azithromycin.  Defer to primary team.  #Hypokalemia: Receiving LR.  Follow-up morning lab.  May need repletion.  #Anion gap metabolic acidosis due to AKI: Received IV sodium bicarbonate.  Follow lab.  #Anemia due to iron deficiency: Recommend IV iron once infection ruled out.  Thank you for the consult.  We will follow with you.  Corynne Scibilia Tanna Furry 12/11/2020, 10:45 AM  [employer].

## 2020-12-11 NOTE — Consult Note (Signed)
Consultation Note Date: 12/11/2020   Patient Name: Andrew [name]  DOB: 06/18/1932  MRN: [id number]  Age / Sex: 87 y.o., male   PCP: [name], MD Referring Physician: Donne Hazel, MD   REASON FOR CONSULTATION:Establishing goals of care  Palliative Care consult requested for goals of care discussion in this 87 y.o. male with a medical history significant for CABG (2010) thoracic aortic aneurysm s/p stent graft repair, diastolic CHF, BPH, urinary retention (self-catheterization 3 times per day), CKD stage II, atrial fibrillation (not anticoagulated), pretension, CAD, and AAA.  Patient presented to the ED from home with complaints of productive cough and abnormal lab work.  On admission patient reported productive cough x2 weeks with no improvement despite Augmentin ordered by his PCP.  During work-up BUN 161, creatinine 8.71, bicarb 17, CT of chest, abdomen, pelvis showed new left upper lobe infiltrate.  Patient receiving IV antibiotics.  Currently followed by nephrology.  Clinical Assessment and Goals of Care: I have reviewed medical records including lab results, imaging, Epic notes, and MAR, received report from the bedside RN, and assessed the patient.   I met at the bedside with patient and his wife to discuss diagnosis prognosis, GOC, EOL wishes, disposition and options.  [name] is resting in bed.  He is awake, alert and oriented x3.  Denies pain or shortness of breath.  Endorses occasional cough.  I introduced Palliative Medicine as specialized medical care for people living with serious illness. It focuses on providing relief from the symptoms and stress of a serious illness. The goal is to improve quality of life for both the patient and the family.  We discussed a brief life review of the patient, along with his functional and nutritional status.  Patient and wife have been married for 45 years.  They have 3 children, 7 grandchildren, and 3 great-grandchildren.   He is tired from [employer].   Wife shares prior to hospitalization patient was not doing well over the last 2-3 weeks.  Patient had a bad fall 2 weeks prior and health has been challenging since.  Wife states prior to aneurysm repair patient was active and going to the gym 6 times per week.  He is independent of all ADLs.  He self catheterizes 3 times a day due to urinary retention.  We discussed His current illness and what it means in the larger context of His on-going co-morbidities.  We discussed at length concerns for patients overall kidney function, hypotension, and pneumonia.  Natural disease trajectory and expectations at EOL were discussed.  Patient and wife verbalized understanding of his current illness and comorbidities.  They are remaining hopeful for improvement with medical interventions with a goal of patient returning to baseline.  A detailed discussion was had today regarding advanced directives.  Concepts specific to code status, artifical feeding and hydration, continued IV antibiotics and rehospitalization.  Patient states wife, [name] is his Education officer, community.  We discussed at length patient's full code status and with consideration of his current illness and comorbidities.  Wife becomes tearful expressing they have only briefly had similar discussions.  They are mutually requesting patient remain a full code with plans for ongoing discussions.  Wife acknowledges their advanced age and medical illness in the setting of a medical emergency.  Patient and wife unsure about their wishes as it relates to dialysis if needed.  Wife states they would have to discuss further, however is not interested in making any  decisions at this time. [name] states once he is required to make a decisions (knowing this is the only option) they will then make final decisions based on his condition and outlook at that moment.   She is tearful expressing she wishes to only  focusing on their hopes of patient improving. We discussed importance of decision making prior to emergent events to be prepared and hopefully minimize emotionally driven decisions. She verbalized understanding and appreciation sharing they will continue to remain hopeful but approach decisions as needed. She acknowledges her awareness of the importance of this conversations and the need but feels they are not quite ready to extensively make decisions of such.   Values and goals of care important to patient and family were attempted to be elicited.    I discussed the importance of continued conversation with family and their medical providers regarding overall plan of care and treatment options, ensuring decisions are within the context of the patients values and GOCs. Wife appreciative of discussion.   Questions and concerns were addressed. Patient and wife was encouraged to call with questions or concerns.  PMT will continue to support holistically as needed.   CODE STATUS: Full code  ADVANCE DIRECTIVES: Primary Decision Maker: Patient/Wife   SYMPTOM MANAGEMENT: Per attending  Palliative Prophylaxis:   Bowel Regimen, Delirium Protocol, Eye Care, Frequent Pain Assessment and Oral Care  PSYCHO-SOCIAL/SPIRITUAL:  Support System: Family  Desire for further Chaplaincy support: No  Additional Recommendations (Limitations, Scope, Preferences):  Treat the treatable, full code, ongoing discussions  Education on hospice/palliative    PAST MEDICAL HISTORY: Past Medical History:  Diagnosis Date  . AAA (abdominal aortic aneurysm) ([place])   . Angioedema 06/01/2015  . Arthritis   . Back pain   . 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 Chronic kidney disease    ? bph, pt self catheterizes himself on schedule  . Complication of anesthesia    had to have Foley post op cabg and lumb lam  . Coronary artery disease    s/p remote PCI // s/p CABG in  2010 Echo 01/2019: EF 60-65, Gr 1 DD, normal RVSF, RVSP 36, mild LAE, mild to mod TR  . Hypertension   . PVC's (premature ventricular contractions)    Monitor 12/2018:  Sinus rhythm with frequent PVC's (11.5%) and periods of bigeminy.   . Thoracic aortic aneurysm ([place])    09/29/16 CT: 4.3 cm ascending, 4.5 cm descending. 1 yr f/u rec // s/p endovascular repair 09/2019 (Dr. Carlis Abbott)  . Urinary retention   . Wears glasses     ALLERGIES:  is allergic to quinolones and naltrexone.   MEDICATIONS:  Current Facility-Administered Medications  Medication Dose Route Frequency Provider Last Rate Last Admin  . acetaminophen (TYLENOL) tablet 650 mg  650 mg Oral Q6H PRN Opyd, Ilene Qua, MD   650 mg at 12/10/20 2340   Or  . acetaminophen (TYLENOL) suppository 650 mg  650 mg Rectal Q6H PRN Opyd, Ilene Qua, MD      . albuterol (PROVENTIL) (2.5 MG/3ML) 0.083% nebulizer solution 2.5 mg  2.5 mg Nebulization Q2H PRN Opyd, Ilene Qua, MD      . aspirin EC tablet 81 mg  81 mg Oral Daily Opyd, Ilene Qua, MD   81 mg at 12/11/20 1016  . atorvastatin (LIPITOR) tablet 20 mg  20 mg Oral Daily Opyd, Ilene Qua, MD   20 mg at 12/11/20 1016  . azithromycin (  ZITHROMAX) 500 mg in sodium chloride 0.9 % 250 mL IVPB  500 mg Intravenous Q24H Opyd, Ilene Qua, MD      . cefTRIAXone (ROCEPHIN) 2 g in sodium chloride 0.9 % 100 mL IVPB  2 g Intravenous Q24H Opyd, Ilene Qua, MD      . heparin injection 5,000 Units  5,000 Units Subcutaneous Q8H Opyd, Ilene Qua, MD      . midodrine (PROAMATINE) tablet 10 mg  10 mg Oral TID WC Rosita Fire, MD      . ondansetron [facility]) tablet 4 mg  4 mg Oral Q6H PRN Opyd, Ilene Qua, MD       Or  . ondansetron (ZOFRAN) injection 4 mg  4 mg Intravenous Q6H PRN Opyd, Ilene Qua, MD        VITAL SIGNS: BP (!) 86/54 (BP Location: Right Arm)   Pulse 80   Temp 98.4 F (36.9 C) (Oral)   Resp 15   Ht $R'5\' 8"'QA$  (1.727 m)   Wt 69.7 kg   SpO2 91%   BMI 23.36 kg/m  Filed Weights   12/10/20 1551  12/11/20 0500  Weight: 65.3 kg 69.7 kg    Estimated body mass index is 23.36 kg/m as calculated from the following:   Height as of this encounter: $RemoveBeforeD'5\' 8"'bDhsGpNGeiQDhP$  (1.727 m).   Weight as of this encounter: 69.7 kg.  LABS: CBC:    Component Value Date/Time   WBC 9.9 12/10/2020 2323   HGB 8.1 (L) 12/10/2020 2323   HCT 23.9 (L) 12/10/2020 2323   PLT 169 12/10/2020 2323   Comprehensive Metabolic Panel:    Component Value Date/Time   NA 141 12/11/2020 1209   NA 139 03/18/2020 1456   K 3.2 (L) 12/11/2020 1209   BUN 138 (H) 12/11/2020 1209   BUN 44 (H) 03/18/2020 1456   CREATININE 8.28 (H) 12/11/2020 1209   CREATININE 1.32 (H) 08/19/2019 1358   ALBUMIN 2.3 (L) 12/11/2020 1209     Review of Systems Unless otherwise noted, a complete review of systems is negative.  Physical Exam General: NAD Cardiovascular: regular rate and rhythm Pulmonary: Rhonchi diminished bilaterally  Abdomen: soft, nontender, + bowel sounds Extremities: Bilateral ankle/lower extremity edema, no joint deformities Skin: no rashes, warm and dry Neurological: AAOx3, mood appropriate  Prognosis: Guarded  Discharge Planning:  To Be Determined  Recommendations: . Full Code-as confirmed and requested by patient and wife . Continue with current plan of care, due to treatable. . Patient and wife clear with expressed goals for all medical interventions to allow patient every opportunity to continue to thrive with a goal of returning home with wife.  We discussed at [place] and wishes for aggressive interventions such as dialysis/artificial feedings.  Patient and wife mutually verbalized wishes to focus on improvement.  They are not prepared to make any final decisions stating decisions in case by case basis with driven decisions focused around patient's condition and medical updates at that time. They are not sure if he would want dialysis and prefer not to consider until it is required.  Marland Kitchen PMT will continue to  support and follow as needed. Please call team line with urgent needs.   Palliative Performance Scale:               Patient and wife, [name] expressed understanding and was in agreement with this plan.   Thank you for allowing the Palliative Medicine Team to assist in the care of this patient. Please utilize secure  chat with additional questions, if there is no response within 30 minutes please call the above phone number.   Time In: 1215 Time Out: 1310 Time Total: 55 min.   Visit consisted of counseling and education dealing with the complex and emotionally intense issues of symptom management and palliative care in the setting of serious and potentially life-threatening illness.Greater than 50%  of this time was spent counseling and coordinating care related to the above assessment and plan.  Signed by:  Alda Lea, AGPCNP-BC Palliative Medicine Team  Phone: [phone] Pager: [phone] Amion: [place] Team providers are available by phone from 7am to 7pm daily and can be reached through the team cell phone.  Should this patient require assistance outside of these hours, please call the patient's attending physician.

## 2020-12-12 DIAGNOSIS — N179 Acute kidney failure, unspecified: Secondary | ICD-10-CM | POA: Diagnosis not present

## 2020-12-12 DIAGNOSIS — I5032 Chronic diastolic (congestive) heart failure: Secondary | ICD-10-CM | POA: Diagnosis not present

## 2020-12-12 LAB — CBC
HCT: 22.1 % — ABNORMAL LOW (ref 39.0–52.0)
Hemoglobin: 7.4 g/dL — ABNORMAL LOW (ref 13.0–17.0)
MCH: 30.2 pg (ref 26.0–34.0)
MCHC: 33.5 g/dL (ref 30.0–36.0)
MCV: 90.2 fL (ref 80.0–100.0)
Platelets: 168 10*3/uL (ref 150–400)
RBC: 2.45 MIL/uL — ABNORMAL LOW (ref 4.22–5.81)
RDW: 14.1 % (ref 11.5–15.5)
WBC: 10 10*3/uL (ref 4.0–10.5)
nRBC: 0 % (ref 0.0–0.2)

## 2020-12-12 LAB — BASIC METABOLIC PANEL
Anion gap: 17 — ABNORMAL HIGH (ref 5–15)
BUN: 128 mg/dL — ABNORMAL HIGH (ref 8–23)
CO2: 21 mmol/L — ABNORMAL LOW (ref 22–32)
Calcium: 6.6 mg/dL — ABNORMAL LOW (ref 8.9–10.3)
Chloride: 101 mmol/L (ref 98–111)
Creatinine, Ser: 7.81 mg/dL — ABNORMAL HIGH (ref 0.61–1.24)
GFR, Estimated: 6 mL/min — ABNORMAL LOW (ref >60)
Glucose, Bld: 117 mg/dL — ABNORMAL HIGH (ref 70–99)
Potassium: 3 mmol/L — ABNORMAL LOW (ref 3.5–5.1)
Sodium: 139 mmol/L (ref 135–145)

## 2020-12-12 LAB — PROCALCITONIN: Procalcitonin: 0.19 ng/mL

## 2020-12-12 LAB — MICROALBUMIN / CREATININE URINE RATIO
Creatinine, Urine: 52 mg/dL
Microalb Creat Ratio: 68 mg/g creat — ABNORMAL HIGH (ref 0–29)
Microalb, Ur: 35.4 ug/mL — ABNORMAL HIGH

## 2020-12-12 MED ORDER — POTASSIUM CHLORIDE CRYS ER 20 MEQ PO TBCR
40.0000 meq | EXTENDED_RELEASE_TABLET | Freq: Once | ORAL | Status: AC
  Administered 2020-12-12: 40 meq via ORAL
  Filled 2020-12-12: qty 2

## 2020-12-12 MED ORDER — LACTATED RINGERS IV SOLN: INTRAVENOUS | Status: AC

## 2020-12-12 NOTE — Progress Notes (Signed)
Nephrology Follow-Up Consult note   Assessment/Recommendations: Andrew [name] is a/an 87 y.o. 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 fib, admitted for AKI      Nonoliguric AKI improving: Severe AKI creatinine now improved from 8.3 to 7.8.  Possibly dehydration and ischemic ATN.  Imaging negative for obstruction but possibly played some role.  BUN and creatinine are improving.  Urine output is excellent. -Appears slightly dry today.  Lactated Ringer's 100 cc/h for 12 hours -Continue to monitor daily Cr, Dose meds for GFR -Monitor Daily I/Os, Daily weight  -Maintain MAP>65 for optimal renal perfusion.  -Avoid nephrotoxic medications including NSAIDs and Vanc/Zosyn combo -Currently no indication for HD  Hypotension: Low systolic blood pressures yesterday slightly improved today.  Continue midodrine.  Further work-up per primary  Hypokalemia: Continue lactated Ringer's as above.  Oral potassium 40 mEq today.  Metabolic acidosis: Nearly resolved at this time.  No treatment needed today.  Anemia secondary to iron deficiency.  Management per primary   Recommendations conveyed to primary service.    [facility] 12/12/2020 9:08 AM  ___________________________________________________________  CC: AKI  Interval History/Subjective: Patient states he feels well today.  Creatinine improving.  Urine output has been excellent.   Medications:  Current Facility-Administered Medications  Medication Dose Route Frequency Provider Last Rate Last Admin  . acetaminophen (TYLENOL) tablet 650 mg  650 mg Oral Q6H PRN Opyd, Ilene Qua, MD   650 mg at 12/10/20 2340   Or  . acetaminophen (TYLENOL) suppository 650 mg  650 mg Rectal Q6H PRN Opyd, Ilene Qua, MD      . albuterol (PROVENTIL) (2.5 MG/3ML) 0.083% nebulizer solution 2.5 mg  2.5 mg  Nebulization Q2H PRN Opyd, Ilene Qua, MD      . aspirin EC tablet 81 mg  81 mg Oral Daily Opyd, Ilene Qua, MD   81 mg at 12/12/20 0857  . atorvastatin (LIPITOR) tablet 20 mg  20 mg Oral Daily Opyd, Ilene Qua, MD   20 mg at 12/12/20 0856  . azithromycin (ZITHROMAX) 500 mg in sodium chloride 0.9 % 250 mL IVPB  500 mg Intravenous Q24H Opyd, Ilene Qua, MD 250 mL/hr at 12/11/20 2221 500 mg at 12/11/20 2221  . cefTRIAXone (ROCEPHIN) 2 g in sodium chloride 0.9 % 100 mL IVPB  2 g Intravenous Q24H Opyd, Ilene Qua, MD 200 mL/hr at 12/11/20 2056 2 g at 12/11/20 2056  . Chlorhexidine Gluconate Cloth 2 % PADS 6 each  6 each Topical Daily Donne Hazel, MD   6 each at 12/12/20 0858  . heparin injection 5,000 Units  5,000 Units Subcutaneous Q8H Vianne Bulls, MD   5,000 Units at 12/12/20 0538  . midodrine (PROAMATINE) tablet 10 mg  10 mg Oral TID WC Rosita Fire, MD   10 mg at 12/12/20 0857  . ondansetron (ZOFRAN) tablet 4 mg  4 mg Oral Q6H PRN Opyd, Ilene Qua, MD       Or  . ondansetron (ZOFRAN) injection 4 mg  4 mg Intravenous Q6H PRN Opyd, Ilene Qua, MD          Review of Systems: 10 systems reviewed and negative except per interval history/subjective  Physical Exam: Vitals:   12/12/20 0147 12/12/20 0730  BP: 99/60 117/66  Pulse: 72 65  Resp: 15 12  Temp: 99.1 F (37.3 C) 98.2 F (36.8 C)  SpO2: 96% 92%  Total I/O In: -  Out: 300 [Urine:300]  Intake/Output Summary (Last 24 hours) at 12/12/2020 0908 Last data filed at 12/12/2020 1572 Gross per 24 hour  Intake 1070 ml  Output 1950 ml  Net -880 ml   Constitutional: Elderly, lying in bed, no distress ENMT: ears and nose without scars or lesions, dry mucous membranes CV: normal rate, no edema Respiratory: Bilateral chest rise, normal work of breathing Gastrointestinal: soft, non-tender, no palpable masses or hernias Skin: no visible lesions or rashes Psych: alert, appropriate mood and affect   Test Results I personally  reviewed new and old clinical labs and radiology tests Lab Results  Component Value Date   NA 139 12/12/2020   K 3.0 (L) 12/12/2020   CL 101 12/12/2020   CO2 21 (L) 12/12/2020   BUN 128 (H) 12/12/2020   CREATININE 7.81 (H) 12/12/2020   CALCIUM 6.6 (L) 12/12/2020   ALBUMIN 2.3 (L) 12/11/2020   PHOS 8.2 (H) 12/11/2020

## 2020-12-12 NOTE — Progress Notes (Signed)
PROGRESS NOTE    Andrew [name]  CMK:[id number] DOB: 12/19/1931 DOA: 12/10/2020 PCP: Daisy Floro, MD    Brief Narrative:  87 y.o. male with medical history significant for thoracic aortic aneurysm status post stent graft repair, CABG in 2010, AAA, chronic diastolic CHF, BPH, urinary retention, CKD II, and remote atrial fibrillation not anticoagulated, now presenting to the emergency department for evaluation of abnormal blood work.  Patient reports that he has had a productive cough and mild malaise for roughly 2 weeks that did not improve with a course of Augmentin that he just completed.  He followed up with PCP, blood work was obtained, and he was directed to the ED for further evaluation of abnormal results.  Patient reports a loss of appetite for at least a week but denies any nausea, vomiting, diarrhea, abdominal pain, flank pain, or change in his urine.  Patient reports that his chronic bilateral ankle swelling is unchanged and notes that he does not take his Lasix and potassium supplement regularly.  CT the chest, abdomen, and pelvis is notable for new left upper lobe infiltrate, unchanged stent graft repair of thoracic aortic aneurysm, and grossly stable infrarenal AAA.  Patient was treated with Rocephin, azithromycin, albuterol, and IV fluids in the ED  Assessment & Plan:   Principal Problem:   AKI (acute kidney injury) (HCC) Active Problems:   Thoracic aortic aneurysm without rupture (HCC)   Coronary artery disease   Urinary retention   AAA (abdominal aortic aneurysm) without rupture (HCC)   Essential hypertension   Normocytic anemia   Chronic diastolic CHF (congestive heart failure) (HCC)   Community acquired pneumonia  1. Acute renal failure superimposed on CKD II; uremia; metabolic acidosis   - Presented after evaluation of renal failure on outpatient blood work and is found to have BUN of 161 and SCr 8.71 (44 & 1.09 in August '21) with serum bicarb of 17 and normal  potassium  - Kidneys appear normal with no obstruction on CT in ED - He has had decreased appetite recently but no N/V/D; recently completed a course of Augmentin, denies any other new medications  - Nephrology is following, recommendation for continued IVF hydration and to avoid nephrotoxic meds -Appreciate input by Palliative Care who established goals of care. Pt's wishes are for full scope of care, including HD if needed  2. Pneumonia  - Continues to have productive cough despite recent outpatient antibiotics  - New LUL infiltrate noted on CT in ED  - Pt is now continued on Rocephin and azithromycin - Procalcitonin of 0.19, suggesting de-escalation of abx soon  3. CAD  - No anginal complaints without chest pain this AM - Continue ASA and Lipitor as tolerated  4. Chronic diastolic CHF  - Appears compensated  - Hold Lasix while hydrating, monitor weight and I/Os     5. Thoracic aortic aneurysm; AAA - Appears stable on CT in ED  - Would follow up with  vascular surgery follow-up on discharge   6. Iron deficiency Anemia  - Presenting Hgb is 9.4, down from 11.7 in March 2021  - Hgb is down to 7.4 -Iron is mildly low at 26 -Will check stool for blood. No obvious source of bleeding at this time -Will hold ASA and prophylactic heparin for now  DVT prophylaxis: scd's Code Status: Full Family Communication: Pt in room, family is at bedside  Status is: Inpatient  Remains inpatient appropriate because:Inpatient level of care appropriate due to severity of illness  Dispo: The patient is from: Home              Anticipated d/c is to: Home              Patient currently is not medically stable to d/c.   Difficult to place patient No   Consultants:   Nephrology  Procedures:     Antimicrobials: Anti-infectives (From admission, onward)   Start     Dose/Rate Route Frequency Ordered Stop   12/11/20 2000  cefTRIAXone (ROCEPHIN) 2 g in sodium chloride 0.9 % 100 mL IVPB         2 g 200 mL/hr over 30 Minutes Intravenous Every 24 hours 12/10/20 2308 12/15/20 1959   12/11/20 2000  azithromycin (ZITHROMAX) 500 mg in sodium chloride 0.9 % 250 mL IVPB        500 mg 250 mL/hr over 60 Minutes Intravenous Every 24 hours 12/10/20 2308 12/15/20 1959   12/10/20 1945  cefTRIAXone (ROCEPHIN) 1 g in sodium chloride 0.9 % 100 mL IVPB        1 g 200 mL/hr over 30 Minutes Intravenous  Once 12/10/20 1940 12/10/20 2037   12/10/20 1945  azithromycin (ZITHROMAX) 500 mg in sodium chloride 0.9 % 250 mL IVPB        500 mg 250 mL/hr over 60 Minutes Intravenous  Once 12/10/20 1940 12/10/20 2142      Subjective: Without chest pain or sob  Objective: Vitals:   12/11/20 2141 12/12/20 0147 12/12/20 0730 12/12/20 1201  BP: 120/69 99/60 117/66 129/73  Pulse: 73 72 65 66  Resp: 13 15 12 15   Temp: 98.1 F (36.7 C) 99.1 F (37.3 C) 98.2 F (36.8 C) 97.8 F (36.6 C)  TempSrc: Oral Oral Oral Oral  SpO2: 97% 96% 92% 95%  Weight:      Height:        Intake/Output Summary (Last 24 hours) at 12/12/2020 1532 Last data filed at 12/12/2020 1207 Gross per 24 hour  Intake 590 ml  Output 1800 ml  Net -1210 ml   Filed Weights   12/10/20 1551 12/11/20 0500  Weight: 65.3 kg 69.7 kg    Examination: General exam: Conversant, in no acute distress Respiratory system: normal chest rise, clear, no audible wheezing Cardiovascular system: regular rhythm, s1-s2 Gastrointestinal system: Nondistended, nontender, pos BS Central nervous system: No seizures, no tremors Extremities: No cyanosis, no joint deformities Skin: No rashes, no pallor Psychiatry: Affect normal // no auditory hallucinations   Data Reviewed: I have personally reviewed following labs and imaging studies  CBC: Recent Labs  Lab 12/10/20 1713 12/10/20 2323 12/12/20 0051  WBC 11.8* 9.9 10.0  NEUTROABS 10.0*  --   --   HGB 9.4* 8.1* 7.4*  HCT 28.4* 23.9* 22.1*  MCV 90.2 90.2 90.2  PLT 169 169 341   Basic Metabolic  Panel: Recent Labs  Lab 12/10/20 1713 12/10/20 2323 12/11/20 1209 12/12/20 0051  NA 139 137 141 139  K 3.8 3.4* 3.2* 3.0*  CL 99 101 104 101  CO2 17* 17* 22 21*  GLUCOSE 118* 114* 143* 117*  BUN 161* 150* 138* 128*  CREATININE 8.71* 9.32* 8.28* 7.81*  CALCIUM 7.3* 7.1* 7.1* 6.6*  PHOS  --   --  8.2*  --    GFR: Estimated Creatinine Clearance: 6.6 mL/min (A) (by C-G formula based on SCr of 7.81 mg/dL (H)). Liver Function Tests: Recent Labs  Lab 12/10/20 1713 12/11/20 1209  AST 12*  --   ALT  9  --   ALKPHOS 66  --   BILITOT 0.3  --   PROT 7.3  --   ALBUMIN 3.5 2.3*   No results for input(s): LIPASE, AMYLASE in the last 168 hours. No results for input(s): AMMONIA in the last 168 hours. Coagulation Profile: No results for input(s): INR, PROTIME in the last 168 hours. Cardiac Enzymes: Recent Labs  Lab 12/10/20 1852  CKTOTAL 66   BNP (last 3 results) Recent Labs    01/09/20 1443  PROBNP 1,018*   HbA1C: No results for input(s): HGBA1C in the last 72 hours. CBG: No results for input(s): GLUCAP in the last 168 hours. Lipid Profile: No results for input(s): CHOL, HDL, LDLCALC, TRIG, CHOLHDL, LDLDIRECT in the last 72 hours. Thyroid Function Tests: No results for input(s): TSH, T4TOTAL, FREET4, T3FREE, THYROIDAB in the last 72 hours. Anemia Panel: Recent Labs    12/10/20 2323  VITAMINB12 2,201*  FOLATE 17.6  FERRITIN 307  TIBC 232*  IRON 26*  RETICCTPCT 1.5   Sepsis Labs: Recent Labs  Lab 12/10/20 2323 12/12/20 0051  PROCALCITON 0.19  0.19 0.19    Recent Results (from the past 240 hour(s))  Resp Panel by RT-PCR (Flu A&B, Covid) Nasopharyngeal Swab     Status: None   Collection Time: 12/10/20  5:13 PM   Specimen: Nasopharyngeal Swab; Nasopharyngeal(NP) swabs in vial transport medium  Result Value Ref Range Status   SARS Coronavirus 2 by RT PCR NEGATIVE NEGATIVE Final    Comment: (NOTE) SARS-CoV-2 target nucleic acids are NOT DETECTED.  The  SARS-CoV-2 RNA is generally detectable in upper respiratory specimens during the acute phase of infection. The lowest concentration of SARS-CoV-2 viral copies this assay can detect is 138 copies/mL. A negative result does not preclude SARS-Cov-2 infection and should not be used as the sole basis for treatment or other patient management decisions. A negative result may occur with  improper specimen collection/handling, submission of specimen other than nasopharyngeal swab, presence of viral mutation(s) within the areas targeted by this assay, and inadequate number of viral copies(<138 copies/mL). A negative result must be combined with clinical observations, patient history, and epidemiological information. The expected result is Negative.  Fact Sheet for Patients:  EntrepreneurPulse.com.au  Fact Sheet for Healthcare Providers:  IncredibleEmployment.be  This test is no t yet approved or cleared by the [place] FDA and  has been authorized for detection and/or diagnosis of SARS-CoV-2 by FDA under an Emergency Use Authorization (EUA). This EUA will remain  in effect (meaning this test can be used) for the duration of the COVID-19 declaration under Section 564(b)(1) of the Act, 21 U.S.C.section 360bbb-3(b)(1), unless the authorization is terminated  or revoked sooner.       Influenza A by PCR NEGATIVE NEGATIVE Final   Influenza B by PCR NEGATIVE NEGATIVE Final    Comment: (NOTE) The Xpert Xpress SARS-CoV-2/FLU/RSV plus assay is intended as an aid in the diagnosis of influenza from Nasopharyngeal swab specimens and should not be used as a sole basis for treatment. Nasal washings and aspirates are unacceptable for Xpert Xpress SARS-CoV-2/FLU/RSV testing.  Fact Sheet for Patients: EntrepreneurPulse.com.au  Fact Sheet for Healthcare Providers: IncredibleEmployment.be  This test is not yet approved or  cleared by the [place] FDA and has been authorized for detection and/or diagnosis of SARS-CoV-2 by FDA under an Emergency Use Authorization (EUA). This EUA will remain in effect (meaning this test can be used) for the duration of the COVID-19 declaration under Section 564(b)(1)  of the Act, 21 U.S.C. section 360bbb-3(b)(1), unless the authorization is terminated or revoked.  Performed at [facility], [address] [place], [place] [address]      Radiology Studies: CT ABDOMEN PELVIS WO CONTRAST  Result Date: 12/10/2020 CLINICAL DATA:  Hematuria, cough.  Shortness of breath. EXAM: CT CHEST, ABDOMEN AND PELVIS WITHOUT CONTRAST TECHNIQUE: Multidetector CT imaging of the chest, abdomen and pelvis was performed following the standard protocol without IV contrast. COMPARISON:  July 01, 2020. FINDINGS: CT CHEST FINDINGS Cardiovascular: Status post stent graft repair of transverse aortic arch and descending thoracic aorta. Status post coronary bypass graft. Normal cardiac size. No pericardial effusion. Stable aneurysmal dilatation of distal descending thoracic aorta measuring 5.5 cm. Mediastinum/Nodes: No enlarged mediastinal, hilar, or axillary lymph nodes. Thyroid gland, trachea, and esophagus demonstrate no significant findings. Lungs/Pleura: No pneumothorax is noted. Stable probable scarring is noted in the right lower lobe. New airspace opacity is noted in left upper lobe concerning for pneumonia. Musculoskeletal: No chest wall mass or suspicious bone lesions identified. CT ABDOMEN PELVIS FINDINGS Hepatobiliary: No focal liver abnormality is seen. No gallstones, gallbladder wall thickening, or biliary dilatation. Pancreas: Unremarkable. No pancreatic ductal dilatation or surrounding inflammatory changes. Spleen: Normal in size without focal abnormality. Adrenals/Urinary Tract: Adrenal glands are unremarkable. Kidneys are normal, without renal calculi, focal lesion, or  hydronephrosis. Bladder is unremarkable. Stomach/Bowel: The stomach appears normal. There is no evidence of bowel obstruction or inflammation. Vascular/Lymphatic: Aneurysmal dilatation of abdominal aorta is noted, with maximum measured diameter 5.1 cm which is not significantly changed compared to prior exam. No adenopathy is noted. Reproductive: Prostate is unremarkable. Other: No abdominal wall hernia or abnormality. No abdominopelvic ascites. Musculoskeletal: No acute or significant osseous findings. IMPRESSION: New left upper lobe airspace opacity is noted concerning for possible pneumonia. Status post stent graft repair of descending thoracic aortic aneurysm, which has a maximum measured diameter 5.5 cm distally which is unchanged compared to prior exam. Grossly stable 5.1 cm infrarenal abdominal aortic aneurysm is noted. Recommend follow-up CT/MR every 6 months and vascular consultation. This recommendation follows ACR consensus guidelines: White Paper of the ACR Incidental Findings Committee II on Vascular Findings. J Am Coll Radiol 2013; 10:789-794. Aortic Atherosclerosis (ICD10-I70.0). Electronically Signed   By: Marijo Conception M.D.   On: 12/10/2020 19:34   CT Chest Wo Contrast  Result Date: 12/10/2020 CLINICAL DATA:  Hematuria, cough.  Shortness of breath. EXAM: CT CHEST, ABDOMEN AND PELVIS WITHOUT CONTRAST TECHNIQUE: Multidetector CT imaging of the chest, abdomen and pelvis was performed following the standard protocol without IV contrast. COMPARISON:  July 01, 2020. FINDINGS: CT CHEST FINDINGS Cardiovascular: Status post stent graft repair of transverse aortic arch and descending thoracic aorta. Status post coronary bypass graft. Normal cardiac size. No pericardial effusion. Stable aneurysmal dilatation of distal descending thoracic aorta measuring 5.5 cm. Mediastinum/Nodes: No enlarged mediastinal, hilar, or axillary lymph nodes. Thyroid gland, trachea, and esophagus demonstrate no significant  findings. Lungs/Pleura: No pneumothorax is noted. Stable probable scarring is noted in the right lower lobe. New airspace opacity is noted in left upper lobe concerning for pneumonia. Musculoskeletal: No chest wall mass or suspicious bone lesions identified. CT ABDOMEN PELVIS FINDINGS Hepatobiliary: No focal liver abnormality is seen. No gallstones, gallbladder wall thickening, or biliary dilatation. Pancreas: Unremarkable. No pancreatic ductal dilatation or surrounding inflammatory changes. Spleen: Normal in size without focal abnormality. Adrenals/Urinary Tract: Adrenal glands are unremarkable. Kidneys are normal, without renal calculi, focal lesion, or hydronephrosis. Bladder is unremarkable. Stomach/Bowel:  The stomach appears normal. There is no evidence of bowel obstruction or inflammation. Vascular/Lymphatic: Aneurysmal dilatation of abdominal aorta is noted, with maximum measured diameter 5.1 cm which is not significantly changed compared to prior exam. No adenopathy is noted. Reproductive: Prostate is unremarkable. Other: No abdominal wall hernia or abnormality. No abdominopelvic ascites. Musculoskeletal: No acute or significant osseous findings. IMPRESSION: New left upper lobe airspace opacity is noted concerning for possible pneumonia. Status post stent graft repair of descending thoracic aortic aneurysm, which has a maximum measured diameter 5.5 cm distally which is unchanged compared to prior exam. Grossly stable 5.1 cm infrarenal abdominal aortic aneurysm is noted. Recommend follow-up CT/MR every 6 months and vascular consultation. This recommendation follows ACR consensus guidelines: White Paper of the ACR Incidental Findings Committee II on Vascular Findings. J Am Coll Radiol 2013; 10:789-794. Aortic Atherosclerosis (ICD10-I70.0). Electronically Signed   By: Marijo Conception M.D.   On: 12/10/2020 19:34   DG Chest Port 1 View  Result Date: 12/10/2020 CLINICAL DATA:  Cough and shortness of breath for  2 weeks EXAM: PORTABLE CHEST 1 VIEW COMPARISON:  07/01/2020 FINDINGS: Cardiac shadow is enlarged but stable. Diffuse aortic stent graft is noted in the descending thoracic aorta. Postsurgical changes are again noted and stable. Chronic pleural thickening laterally in the right lung base is noted. No focal infiltrate or sizable effusion is seen. No bony abnormality is seen. Stable calcified pleural plaques are noted. IMPRESSION: Stable appearance of the chest as described. Electronically Signed   By: Inez Catalina M.D.   On: 12/10/2020 16:58    Scheduled Meds: . aspirin EC  81 mg Oral Daily  . atorvastatin  20 mg Oral Daily  . Chlorhexidine Gluconate Cloth  6 each Topical Daily  . heparin  5,000 Units Subcutaneous Q8H  . midodrine  10 mg Oral TID WC   Continuous Infusions: . azithromycin 500 mg (12/11/20 2221)  . cefTRIAXone (ROCEPHIN)  IV 2 g (12/11/20 2056)  . lactated ringers 100 mL/hr at 12/12/20 1042     LOS: 2 days   Marylu Lund, MD [facility] Pager On Amion  If 7PM-7AM, please contact night-coverage 12/12/2020, 3:32 PM

## 2020-12-13 DIAGNOSIS — N179 Acute kidney failure, unspecified: Secondary | ICD-10-CM | POA: Diagnosis not present

## 2020-12-13 DIAGNOSIS — I5032 Chronic diastolic (congestive) heart failure: Secondary | ICD-10-CM | POA: Diagnosis not present

## 2020-12-13 LAB — CBC
HCT: 25.5 % — ABNORMAL LOW (ref 39.0–52.0)
Hemoglobin: 8.3 g/dL — ABNORMAL LOW (ref 13.0–17.0)
MCH: 30.1 pg (ref 26.0–34.0)
MCHC: 32.5 g/dL (ref 30.0–36.0)
MCV: 92.4 fL (ref 80.0–100.0)
Platelets: 190 10*3/uL (ref 150–400)
RBC: 2.76 MIL/uL — ABNORMAL LOW (ref 4.22–5.81)
RDW: 14.4 % (ref 11.5–15.5)
WBC: 9.2 10*3/uL (ref 4.0–10.5)
nRBC: 0 % (ref 0.0–0.2)

## 2020-12-13 LAB — BASIC METABOLIC PANEL
Anion gap: 14 (ref 5–15)
BUN: 113 mg/dL — ABNORMAL HIGH (ref 8–23)
CO2: 21 mmol/L — ABNORMAL LOW (ref 22–32)
Calcium: 7 mg/dL — ABNORMAL LOW (ref 8.9–10.3)
Chloride: 107 mmol/L (ref 98–111)
Creatinine, Ser: 7.72 mg/dL — ABNORMAL HIGH (ref 0.61–1.24)
GFR, Estimated: 6 mL/min — ABNORMAL LOW (ref >60)
Glucose, Bld: 111 mg/dL — ABNORMAL HIGH (ref 70–99)
Potassium: 3.2 mmol/L — ABNORMAL LOW (ref 3.5–5.1)
Sodium: 142 mmol/L (ref 135–145)

## 2020-12-13 LAB — MAGNESIUM: Magnesium: 1.4 mg/dL — ABNORMAL LOW (ref 1.7–2.4)

## 2020-12-13 MED ORDER — MAGNESIUM SULFATE 2 GM/50ML IV SOLN
2.0000 g | Freq: Once | INTRAVENOUS | Status: AC
  Administered 2020-12-13: 2 g via INTRAVENOUS
  Filled 2020-12-13: qty 50

## 2020-12-13 MED ORDER — ASPIRIN 81 MG PO CHEW
81.0000 mg | CHEWABLE_TABLET | Freq: Every day | ORAL | Status: DC
  Administered 2020-12-13 – 2021-01-10 (×28): 81 mg via ORAL
  Filled 2020-12-13 (×28): qty 1

## 2020-12-13 MED ORDER — POTASSIUM CHLORIDE CRYS ER 20 MEQ PO TBCR: 20.0000 meq | EXTENDED_RELEASE_TABLET | Freq: Once | ORAL | Status: DC

## 2020-12-13 MED ORDER — FERROUS SULFATE 325 (65 FE) MG PO TABS
325.0000 mg | ORAL_TABLET | Freq: Every day | ORAL | Status: DC
  Administered 2020-12-13 – 2021-01-10 (×28): 325 mg via ORAL
  Filled 2020-12-13 (×30): qty 1

## 2020-12-13 MED ORDER — POTASSIUM CHLORIDE CRYS ER 20 MEQ PO TBCR
30.0000 meq | EXTENDED_RELEASE_TABLET | Freq: Once | ORAL | Status: AC
  Administered 2020-12-13: 30 meq via ORAL
  Filled 2020-12-13: qty 1

## 2020-12-13 MED ORDER — CALCIUM GLUCONATE-NACL 1-0.675 GM/50ML-% IV SOLN
1.0000 g | Freq: Once | INTRAVENOUS | Status: AC
  Administered 2020-12-13: 1000 mg via INTRAVENOUS
  Filled 2020-12-13: qty 50

## 2020-12-13 NOTE — Evaluation (Signed)
Physical Therapy Evaluation Patient Details Name: Andrew [name] CURRENT MRN: [id number] DOB: 1931/10/28 Today's Date: 12/13/2020   History of Present Illness  87 y.o. male presented 12/10/20 to the emergency department for evaluation of abnormal blood work. Acute renal failure with uremia;  CT the chest, abdomen, and pelvis is notable for pneumonia PMH- thoracic aortic aneurysm status post stent graft repair, CABG in 2010, AAA, chronic diastolic CHF, BPH, urinary retention, CKD II, and remote atrial fibrillation not anticoagulated, back surgery  Clinical Impression   Pt admitted secondary to problem above with deficits below. PTA patient was modified independent using RW for ambulation and supervision for bathing (walk-in shower with seat and grab bars). Prior to hospitalzation, pt had begun to weaken but was still able to walk with RW. Pt currently requires moderated to maximum assistance for bed mobility, transfers, and gait with RW. Wife present during session and agreed that she cannot provide the amount of assistance pt currently requires. She and pt are in agreement with need for additional therapy prior to return home.  Will continue to follow acutely to maximize functional mobility independence and safety.       Follow Up Recommendations CIR    Equipment Recommendations  Wheelchair (measurements PT);Wheelchair cushion (measurements PT);Hospital bed (if discharging home without rehab)    Recommendations for Other Services OT consult     Precautions / Restrictions Precautions Precautions: Fall      Mobility  Bed Mobility Overal bed mobility: Needs Assistance Bed Mobility: Rolling;Sidelying to Sit;Sit to Sidelying Rolling: Mod assist Sidelying to sit: Mod assist     Sit to sidelying: Mod assist General bed mobility comments: wiht rail and HOB 15; assist to move LEs and torso    Transfers Overall transfer level: Needs assistance Equipment used: Rolling walker (2  wheeled);None Transfers: Sit to/from [facility] Sit to Stand: Mod assist;+2 safety/equipment;From elevated surface (with RW from [facility]) Stand pivot transfers: Max assist (without RW max assist with pt in hurry to pivot to [facility];)          Ambulation/Gait Ambulation/Gait assistance: Min assist;+2 safety/equipment Gait Distance (Feet): 2 Feet Assistive device: Rolling walker (2 wheeled) Gait Pattern/deviations: Step-to pattern;Shuffle;Trunk flexed     General Gait Details: pivotal steps and side steps from [facility] to bed  Stairs            Wheelchair Mobility    Modified Rankin (Stroke Patients Only)       Balance Overall balance assessment: Needs assistance   Sitting balance-Leahy Scale: Fair       Standing balance-Leahy Scale: Poor                               Pertinent Vitals/Pain Pain Assessment: No/denies pain    Home Living Family/patient expects to be discharged to:: Private residence Living Arrangements: Spouse/significant other Available Help at Discharge: Family;Available 24 hours/day Type of Home: Other(Comment) (townhouse) Home Access: Stairs to enter Entrance Stairs-Rails: Right Entrance Stairs-Number of Steps: 1 Home Layout: One level Home Equipment: Walker - 2 wheels;Shower seat - built in;Grab bars - tub/shower      Prior Function Level of Independence: Independent with assistive device(s)         Comments: has been modified independent with RW since March 2021 after his aortic aneurysm repair (wife takes him to gym 3x/week)     Hand Dominance   Dominant Hand: Right    Extremity/Trunk Assessment   Upper Extremity  Assessment Upper Extremity Assessment: Generalized weakness    Lower Extremity Assessment Lower Extremity Assessment: Generalized weakness    Cervical / Trunk Assessment Cervical / Trunk Assessment: Kyphotic  Communication   Communication: No difficulties  Cognition Arousal/Alertness:  Awake/alert Behavior During Therapy: Flat affect Overall Cognitive Status: Within Functional Limits for tasks assessed (not fully assessed; following simple commands)                                        General Comments General comments (skin integrity, edema, etc.): Wife present and very supportive/attentive. Donned pt's compression socks and shoes prior to sit EOB with pt reporting initial dizziness that subsided within 1 minute. BP machine not working to monitor BP    Exercises     Assessment/Plan    PT Assessment Patient needs continued PT services  PT Problem List Decreased strength;Decreased activity tolerance;Decreased balance;Decreased mobility;Decreased knowledge of use of DME;Decreased safety awareness;Decreased knowledge of precautions       PT Treatment Interventions DME instruction;Gait training;Functional mobility training;Therapeutic activities;Therapeutic exercise;Balance training;Patient/family education    PT Goals (Current goals can be found in the Care Plan section)  Acute Rehab PT Goals Patient Stated Goal: to get strong enough to go home PT Goal Formulation: With patient Time For Goal Achievement: 12/27/20 Potential to Achieve Goals: Good    Frequency Min 3X/week   Barriers to discharge Other (comment) wife cannot provide moderate assistance; needs pt to be min assist or less    Co-evaluation               AM-PAC PT "6 Clicks" Mobility  Outcome Measure Help needed turning from your back to your side while in a flat bed without using bedrails?: A Lot Help needed moving from lying on your back to sitting on the side of a flat bed without using bedrails?: A Lot Help needed moving to and from a bed to a chair (including a wheelchair)?: Total Help needed standing up from a chair using your arms (e.g., wheelchair or bedside chair)?: Total Help needed to walk in hospital room?: Total Help needed climbing 3-5 steps with a railing? :  Total 6 Click Score: 8    End of Session Equipment Utilized During Treatment: Gait belt Activity Tolerance: Patient limited by fatigue Patient left: in bed;with call bell/phone within reach;with bed alarm set;with family/visitor present Nurse Communication: Mobility status PT Visit Diagnosis: Muscle weakness (generalized) (M62.81);Difficulty in walking, not elsewhere classified (R26.2)    Time: 2440-1027 PT Time Calculation (min) (ACUTE ONLY): 54 min   Charges:   PT Evaluation $PT Eval Moderate Complexity: 1 Mod PT Treatments $Gait Training: 8-22 mins $Therapeutic Activity: 8-22 mins         Arby Barrette, PT Pager [phone]   Rexanne Mano 12/13/2020, 10:10 AM

## 2020-12-13 NOTE — Progress Notes (Signed)
Inpatient Rehab Admissions Coordinator Note:   Per therapy recommendations, pt was screened for CIR candidacy by Khaleed Holan, MS CCC-SLP. At this time, Pt. Appears to have functional decline and is a good candidate for CIR. Will place order for rehab consult per protocol.  Please contact me with questions.   Kellye Mizner, MS, CCC-SLP Rehab Admissions Coordinator  [phone] (celll) [phone] (office)  

## 2020-12-13 NOTE — Progress Notes (Signed)
Nephrology Follow-Up note   Assessment/Recommendations: Andrew [name] is a/an 87 y.o. 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 fib, admitted for AKI      Nonoliguric AKI improving: Severe AKI possibly dehydration and ischemic ATN.  Imaging negative for obstruction but possibly played some role as well.   - No acute indication for HD; continue supportive care - continue foley catheter  Hypotension:   Continue midodrine.  Further work-up per primary  Hypokalemia:  Potassium 20 meq PO once; replete calcium and check mag; replete mag if needed  Metabolic acidosis:  Improved; defer bicarbonate for now  Anemia secondary to iron deficiency as well as likely component of renal failure. Add oral iron daily.   aranesp 40 mcg once now   Disposition - continue inpatient monitoring for AKI   _________________________________________________________ CC: AKI  Interval History/Subjective: He had 1.1 liters UOP over 5/22 charted.  got LR on 5/22 at 100 /hr x 12 hours.  We discussed risks/benefits/indications for ESA and he agrees.  He's waiting on his wife to come today.  States he normally takes a fluid pill and potassium daily   Review of systems:  Reports some shortness of breath  Denies n/v Denies chest pain Not on home oxygen  Medications:  Current Facility-Administered Medications  Medication Dose Route Frequency Provider Last Rate Last Admin  . acetaminophen (TYLENOL) tablet 650 mg  650 mg Oral Q6H PRN Opyd, Ilene Qua, MD   650 mg at 12/10/20 2340   Or  . acetaminophen (TYLENOL) suppository 650 mg  650 mg Rectal Q6H PRN Opyd, Ilene Qua, MD      . albuterol (PROVENTIL) (2.5 MG/3ML) 0.083% nebulizer solution 2.5 mg  2.5 mg Nebulization Q2H PRN Opyd, Ilene Qua, MD      . atorvastatin (LIPITOR) tablet 20 mg  20 mg Oral Daily Opyd, Ilene Qua, MD   20 mg at 12/12/20  0856  . azithromycin (ZITHROMAX) 500 mg in sodium chloride 0.9 % 250 mL IVPB  500 mg Intravenous Q24H Opyd, Ilene Qua, MD 250 mL/hr at 12/12/20 2118 500 mg at 12/12/20 2118  . cefTRIAXone (ROCEPHIN) 2 g in sodium chloride 0.9 % 100 mL IVPB  2 g Intravenous Q24H Opyd, Ilene Qua, MD 200 mL/hr at 12/12/20 2002 2 g at 12/12/20 2002  . Chlorhexidine Gluconate Cloth 2 % PADS 6 each  6 each Topical Daily Donne Hazel, MD   6 each at 12/12/20 [phone]  . midodrine (PROAMATINE) tablet 10 mg  10 mg Oral TID WC Rosita Fire, MD   10 mg at 12/12/20 0857  . ondansetron (ZOFRAN) tablet 4 mg  4 mg Oral Q6H PRN Opyd, Ilene Qua, MD       Or  . ondansetron (ZOFRAN) injection 4 mg  4 mg Intravenous Q6H PRN Opyd, Ilene Qua, MD          Physical Exam: Vitals:   12/12/20 1500 12/12/20 2036  BP: 125/90 117/72  Pulse: 76 67  Resp: 12 16  Temp: 98 F (36.7 C) 98.3 F (36.8 C)  SpO2: 99% 97%   No intake/output data recorded.  Intake/Output Summary (Last 24 hours) at 12/13/2020 3086 Last data filed at 12/12/2020 1620 Gross per 24 hour  Intake 562.75 ml  Output 1100 ml  Net -537.25 ml   General elderly male in bed in no acute distress HEENT normocephalic atraumatic extraocular movements intact sclera anicteric Neck  supple trachea midline Lungs crackles vs transmitted upper airway sounds; normal work of breathing at rest; on 2.5 liters oxygen Heart S1S2 no rub Abdomen soft nontender nondistended Extremities 1-2+ edema right leg and 1+ edema left leg  Psych normal mood and affect Neuro - alert and oriented x 3 provides hx and follows commands GU - has a foley     Test Results I personally reviewed new and old clinical labs and radiology tests Lab Results  Component Value Date   NA 142 12/13/2020   K 3.2 (L) 12/13/2020   CL 107 12/13/2020   CO2 21 (L) 12/13/2020   BUN 113 (H) 12/13/2020   CREATININE 7.72 (H) 12/13/2020   CALCIUM 7.0 (L) 12/13/2020   ALBUMIN 2.3 (L) 12/11/2020   PHOS  8.2 (H) 12/11/2020    Claudia Desanctis, MD 12/13/2020 7:10 AM

## 2020-12-13 NOTE — Progress Notes (Signed)
PROGRESS NOTE    EMMETTE [name]  QQV:[id number] DOB: 03/07/1932 DOA: 12/10/2020 PCP: [name], MD    Brief Narrative:  87 y.o. male with medical history significant for thoracic aortic aneurysm status post stent graft repair, CABG in 2010, AAA, chronic diastolic CHF, BPH, urinary retention, CKD II, and remote atrial fibrillation not anticoagulated, now presenting to the emergency department for evaluation of abnormal blood work.  Patient reports that he has had a productive cough and mild malaise for roughly 2 weeks that did not improve with a course of Augmentin that he just completed.  He followed up with PCP, blood work was obtained, and he was directed to the ED for further evaluation of abnormal results.  Patient reports a loss of appetite for at least a week but denies any nausea, vomiting, diarrhea, abdominal pain, flank pain, or change in his urine.  Patient reports that his chronic bilateral ankle swelling is unchanged and notes that he does not take his Lasix and potassium supplement regularly.  CT the chest, abdomen, and pelvis is notable for new left upper lobe infiltrate, unchanged stent graft repair of thoracic aortic aneurysm, and grossly stable infrarenal AAA.  Patient was treated with Rocephin, azithromycin, albuterol, and IV fluids in the ED  Assessment & Plan:   Principal Problem:   AKI (acute kidney injury) ([place]) Active Problems:   Thoracic aortic aneurysm without rupture ([place])   Coronary artery disease   Urinary retention   AAA (abdominal aortic aneurysm) without rupture ([place])   Essential hypertension   Normocytic anemia   Chronic diastolic CHF (congestive heart failure) ([place])   Community acquired pneumonia  1. Acute renal failure superimposed on CKD II; uremia; metabolic acidosis   - Presented after evaluation of renal failure on outpatient blood work and is found to have BUN of 161 and SCr 8.71 (44 & 1.09 in August '21) with serum bicarb of 17 and normal  potassium  - Kidneys appear normal with no obstruction on CT in ED - He has had decreased appetite recently but no N/V/D; recently completed a course of Augmentin, denies any other new medications  -Appreciate input by Palliative Care who established goals of care. Pt's wishes are for full scope of care, including HD if needed -nephrology is following. Cr is is improving. No indication for HD per Nephrology  2. Pneumonia  - Continues to have productive cough despite recent outpatient antibiotics  - New LUL infiltrate was noted on CT in ED  - Pt is now continued on Rocephin and azithromycin - Procalcitonin of 0.19, suggesting de-escalation of abx soon. Would complete total 5 days of abx given chest imaging findings  3. CAD  - No anginal complaints without chest pain thus far - ASA was on hold given below anemia. No evidence of acute blood loss, thus will resume ASA  4. Chronic diastolic CHF  - Appears compensated  - Holding Lasix while hydrating, monitor weight and I/Os     5. Thoracic aortic aneurysm; AAA - Appears stable on CT in ED  - Would follow up with  vascular surgery follow-up on discharge   6. Iron deficiency Anemia  - Presenting Hgb is 9.4, down from 11.7 in March 2021  - Hgb noted to be down to 7.4 -Iron is mildly low at 26 -Will check stool for blood. No obvious source of bleeding at this time -Hgb slightly higher today at 8.3 -aranesp given by Nephrology  DVT prophylaxis: scd's Code Status: Full Family Communication:  Pt in room, family is at bedside  Status is: Inpatient  Remains inpatient appropriate because:Inpatient level of care appropriate due to severity of illness   Dispo: The patient is from: Home              Anticipated d/c is to: Home              Patient currently is not medically stable to d/c.   Difficult to place patient No   Consultants:   Nephrology  Procedures:     Antimicrobials: Anti-infectives (From admission, onward)    Start     Dose/Rate Route Frequency Ordered Stop   12/11/20 2000  cefTRIAXone (ROCEPHIN) 2 g in sodium chloride 0.9 % 100 mL IVPB        2 g 200 mL/hr over 30 Minutes Intravenous Every 24 hours 12/10/20 2308 12/15/20 1959   12/11/20 2000  azithromycin (ZITHROMAX) 500 mg in sodium chloride 0.9 % 250 mL IVPB        500 mg 250 mL/hr over 60 Minutes Intravenous Every 24 hours 12/10/20 2308 12/15/20 1959   12/10/20 1945  cefTRIAXone (ROCEPHIN) 1 g in sodium chloride 0.9 % 100 mL IVPB        1 g 200 mL/hr over 30 Minutes Intravenous  Once 12/10/20 1940 12/10/20 2037   12/10/20 1945  azithromycin (ZITHROMAX) 500 mg in sodium chloride 0.9 % 250 mL IVPB        500 mg 250 mL/hr over 60 Minutes Intravenous  Once 12/10/20 1940 12/10/20 2142      Subjective: Without complaints this AM  Objective: Vitals:   12/12/20 2036 12/13/20 0800 12/13/20 1144 12/13/20 1500  BP: 117/72 119/78 137/81 119/79  Pulse: 67 76 71 62  Resp: 16 13 17 12   Temp: 98.3 F (36.8 C) 98.3 F (36.8 C) 98.5 F (36.9 C) 98.7 F (37.1 C)  TempSrc: Oral Oral Oral Oral  SpO2: 97% 98% 100% 100%  Weight:      Height:        Intake/Output Summary (Last 24 hours) at 12/13/2020 1644 Last data filed at 12/13/2020 1600 Gross per 24 hour  Intake 850.28 ml  Output 650 ml  Net 200.28 ml   Filed Weights   12/10/20 1551 12/11/20 0500  Weight: 65.3 kg 69.7 kg    Examination: General exam: Awake, laying in bed, in nad Respiratory system: Normal respiratory effort, no wheezing Cardiovascular system: regular rate, s1, s2 Gastrointestinal system: Soft, nondistended, positive BS Central nervous system: CN2-12 grossly intact, strength intact Extremities: Perfused, no clubbing Skin: Normal skin turgor, no notable skin lesions seen Psychiatry: Mood normal // no visual hallucinations   Data Reviewed: I have personally reviewed following labs and imaging studies  CBC: Recent Labs  Lab 12/10/20 1713 12/10/20 2323  12/12/20 0051 12/13/20 0429  WBC 11.8* 9.9 10.0 9.2  NEUTROABS 10.0*  --   --   --   HGB 9.4* 8.1* 7.4* 8.3*  HCT 28.4* 23.9* 22.1* 25.5*  MCV 90.2 90.2 90.2 92.4  PLT 169 169 168 440   Basic Metabolic Panel: Recent Labs  Lab 12/10/20 1713 12/10/20 2323 12/11/20 1209 12/12/20 0051 12/13/20 0429 12/13/20 0444  NA 139 137 141 139 142  --   K 3.8 3.4* 3.2* 3.0* 3.2*  --   CL 99 101 104 101 107  --   CO2 17* 17* 22 21* 21*  --   GLUCOSE 118* 114* 143* 117* 111*  --   BUN 161* 150* 138*  128* 113*  --   CREATININE 8.71* 9.32* 8.28* 7.81* 7.72*  --   CALCIUM 7.3* 7.1* 7.1* 6.6* 7.0*  --   MG  --   --   --   --   --  1.4*  PHOS  --   --  8.2*  --   --   --    GFR: Estimated Creatinine Clearance: 6.6 mL/min (A) (by C-G formula based on SCr of 7.72 mg/dL (H)). Liver Function Tests: Recent Labs  Lab 12/10/20 1713 12/11/20 1209  AST 12*  --   ALT 9  --   ALKPHOS 66  --   BILITOT 0.3  --   PROT 7.3  --   ALBUMIN 3.5 2.3*   No results for input(s): LIPASE, AMYLASE in the last 168 hours. No results for input(s): AMMONIA in the last 168 hours. Coagulation Profile: No results for input(s): INR, PROTIME in the last 168 hours. Cardiac Enzymes: Recent Labs  Lab 12/10/20 1852  CKTOTAL 66   BNP (last 3 results) Recent Labs    01/09/20 1443  PROBNP 1,018*   HbA1C: No results for input(s): HGBA1C in the last 72 hours. CBG: No results for input(s): GLUCAP in the last 168 hours. Lipid Profile: No results for input(s): CHOL, HDL, LDLCALC, TRIG, CHOLHDL, LDLDIRECT in the last 72 hours. Thyroid Function Tests: No results for input(s): TSH, T4TOTAL, FREET4, T3FREE, THYROIDAB in the last 72 hours. Anemia Panel: Recent Labs    12/10/20 2323  VITAMINB12 2,201*  FOLATE 17.6  FERRITIN 307  TIBC 232*  IRON 26*  RETICCTPCT 1.5   Sepsis Labs: Recent Labs  Lab 12/10/20 2323 12/12/20 0051  PROCALCITON 0.19  0.19 0.19    Recent Results (from the past 240 hour(s))  Resp  Panel by RT-PCR (Flu A&B, Covid) Nasopharyngeal Swab     Status: None   Collection Time: 12/10/20  5:13 PM   Specimen: Nasopharyngeal Swab; Nasopharyngeal(NP) swabs in vial transport medium  Result Value Ref Range Status   SARS Coronavirus 2 by RT PCR NEGATIVE NEGATIVE Final    Comment: (NOTE) SARS-CoV-2 target nucleic acids are NOT DETECTED.  The SARS-CoV-2 RNA is generally detectable in upper respiratory specimens during the acute phase of infection. The lowest concentration of SARS-CoV-2 viral copies this assay can detect is 138 copies/mL. A negative result does not preclude SARS-Cov-2 infection and should not be used as the sole basis for treatment or other patient management decisions. A negative result may occur with  improper specimen collection/handling, submission of specimen other than nasopharyngeal swab, presence of viral mutation(s) within the areas targeted by this assay, and inadequate number of viral copies(<138 copies/mL). A negative result must be combined with clinical observations, patient history, and epidemiological information. The expected result is Negative.  Fact Sheet for Patients:  EntrepreneurPulse.com.au  Fact Sheet for Healthcare Providers:  IncredibleEmployment.be  This test is no t yet approved or cleared by the [place] FDA and  has been authorized for detection and/or diagnosis of SARS-CoV-2 by FDA under an Emergency Use Authorization (EUA). This EUA will remain  in effect (meaning this test can be used) for the duration of the COVID-19 declaration under Section 564(b)(1) of the Act, 21 U.S.C.section 360bbb-3(b)(1), unless the authorization is terminated  or revoked sooner.       Influenza A by PCR NEGATIVE NEGATIVE Final   Influenza B by PCR NEGATIVE NEGATIVE Final    Comment: (NOTE) The Xpert Xpress SARS-CoV-2/FLU/RSV plus assay is intended as an aid  in the diagnosis of influenza from Nasopharyngeal  swab specimens and should not be used as a sole basis for treatment. Nasal washings and aspirates are unacceptable for Xpert Xpress SARS-CoV-2/FLU/RSV testing.  Fact Sheet for Patients: EntrepreneurPulse.com.au  Fact Sheet for Healthcare Providers: IncredibleEmployment.be  This test is not yet approved or cleared by the [place] FDA and has been authorized for detection and/or diagnosis of SARS-CoV-2 by FDA under an Emergency Use Authorization (EUA). This EUA will remain in effect (meaning this test can be used) for the duration of the COVID-19 declaration under Section 564(b)(1) of the Act, 21 U.S.C. section 360bbb-3(b)(1), unless the authorization is terminated or revoked.  Performed at [facility], [address], [place], [place] [address]      Radiology Studies: No results found.  Scheduled Meds: . atorvastatin  20 mg Oral Daily  . Chlorhexidine Gluconate Cloth  6 each Topical Daily  . ferrous sulfate  325 mg Oral Q breakfast  . midodrine  10 mg Oral TID WC   Continuous Infusions: . azithromycin 500 mg (12/12/20 2118)  . cefTRIAXone (ROCEPHIN)  IV 2 g (12/12/20 2002)  . magnesium sulfate bolus IVPB 2 g (12/13/20 1555)     LOS: 3 days   Marylu Lund, MD [facility] Pager On Amion  If 7PM-7AM, please contact night-coverage 12/13/2020, 4:44 PM

## 2020-12-14 DIAGNOSIS — I5032 Chronic diastolic (congestive) heart failure: Secondary | ICD-10-CM | POA: Diagnosis not present

## 2020-12-14 DIAGNOSIS — N179 Acute kidney failure, unspecified: Secondary | ICD-10-CM | POA: Diagnosis not present

## 2020-12-14 DIAGNOSIS — J189 Pneumonia, unspecified organism: Secondary | ICD-10-CM | POA: Diagnosis not present

## 2020-12-14 LAB — MAGNESIUM: Magnesium: 1.7 mg/dL (ref 1.7–2.4)

## 2020-12-14 LAB — CBC
HCT: 24.1 % — ABNORMAL LOW (ref 39.0–52.0)
Hemoglobin: 8 g/dL — ABNORMAL LOW (ref 13.0–17.0)
MCH: 30.7 pg (ref 26.0–34.0)
MCHC: 33.2 g/dL (ref 30.0–36.0)
MCV: 92.3 fL (ref 80.0–100.0)
Platelets: 200 10*3/uL (ref 150–400)
RBC: 2.61 MIL/uL — ABNORMAL LOW (ref 4.22–5.81)
RDW: 14.3 % (ref 11.5–15.5)
WBC: 10.4 10*3/uL (ref 4.0–10.5)
nRBC: 0 % (ref 0.0–0.2)

## 2020-12-14 LAB — BASIC METABOLIC PANEL
Anion gap: 14 (ref 5–15)
BUN: 108 mg/dL — ABNORMAL HIGH (ref 8–23)
CO2: 23 mmol/L (ref 22–32)
Calcium: 7.4 mg/dL — ABNORMAL LOW (ref 8.9–10.3)
Chloride: 106 mmol/L (ref 98–111)
Creatinine, Ser: 7.52 mg/dL — ABNORMAL HIGH (ref 0.61–1.24)
GFR, Estimated: 7 mL/min — ABNORMAL LOW (ref >60)
Glucose, Bld: 114 mg/dL — ABNORMAL HIGH (ref 70–99)
Potassium: 3.5 mmol/L (ref 3.5–5.1)
Sodium: 143 mmol/L (ref 135–145)

## 2020-12-14 MED ORDER — FUROSEMIDE 10 MG/ML IJ SOLN
80.0000 mg | Freq: Once | INTRAMUSCULAR | Status: AC
  Administered 2020-12-14: 80 mg via INTRAVENOUS
  Filled 2020-12-14 (×2): qty 8

## 2020-12-14 MED ORDER — DARBEPOETIN ALFA 40 MCG/0.4ML IJ SOSY
40.0000 ug | PREFILLED_SYRINGE | Freq: Once | INTRAMUSCULAR | Status: AC
  Administered 2020-12-14: 40 ug via SUBCUTANEOUS
  Filled 2020-12-14: qty 0.4

## 2020-12-14 MED ORDER — POTASSIUM CHLORIDE CRYS ER 20 MEQ PO TBCR
40.0000 meq | EXTENDED_RELEASE_TABLET | Freq: Once | ORAL | Status: AC
  Administered 2020-12-14: 40 meq via ORAL
  Filled 2020-12-14: qty 2

## 2020-12-14 MED ORDER — POTASSIUM CHLORIDE CRYS ER 20 MEQ PO TBCR: 20.0000 meq | EXTENDED_RELEASE_TABLET | Freq: Once | ORAL | Status: DC

## 2020-12-14 NOTE — Evaluation (Signed)
Occupational Therapy Evaluation Patient Details Name: Andrew [name] MRN: [id number] DOB: 05/27/1932 Today's Date: 12/14/2020    History of Present Illness 87 y.o. male presented 12/10/20 to the emergency department for evaluation of abnormal blood work. Acute renal failure with uremia;  CT the chest, abdomen, and pelvis is notable for pneumonia PMH- thoracic aortic aneurysm status post stent graft repair, CABG in 2010, AAA, chronic diastolic CHF, BPH, urinary retention, CKD II, and remote atrial fibrillation not anticoagulated, back surgery   Clinical Impression   PTA patient was living with his wife in a private residence and was grossly Mod I with ADLs/IADLs using RW. Patient currently functioning below baseline demonstrating observed ADLs including toilet transfers and hygiene/clothing management with Max A to Total A +2. Patient also limited by deficits listed below including severely decreased activity tolerance, generalized weakness/debility, and need for continuous supplemental O2 and would benefit from continued acute OT services in prep for safe d/c to next level of care.      Follow Up Recommendations  CIR    Equipment Recommendations  Other (comment) (TBD)    Recommendations for Other Services Rehab consult     Precautions / Restrictions Precautions Precautions: Fall Restrictions Weight Bearing Restrictions: No      Mobility Bed Mobility Overal bed mobility: Needs Assistance Bed Mobility: Supine to Sit     Supine to sit: Mod assist     General bed mobility comments: Mod A at trunk initially. Patient unable to tolerate sitting at EOB >3-5 minutes 2/2 fatigue.    Transfers Overall transfer level: Needs assistance Equipment used: 2 person hand held assist Transfers: Sit to/from [employer] Transfers Sit to Stand: +2 safety/equipment;From elevated surface;Total assist Stand pivot transfers: Total assist;+2 physical assistance;+2 safety/equipment        General transfer comment: Total A +2 for sit to stand from EOB. Patient unable to come to full stand. Squat-pivot to [facility] with total A +2. Patient completes less than    Balance Overall balance assessment: Needs assistance Sitting-balance support: Bilateral upper extremity supported;Feet supported Sitting balance-Leahy Scale: Poor Sitting balance - Comments: Supervision A at best, Mod A at most with fatigue.     Standing balance-Leahy Scale: Poor Standing balance comment: Reliant on +2 external assist.                           ADL either performed or assessed with clinical judgement   ADL Overall ADL's : Needs assistance/impaired     Grooming: Set up;Sitting Grooming Details (indicate cue type and reason): Able to wash face seated on BSC with set-up assist.                 Toilet Transfer: Total assistance;+2 for physical assistance;+2 for safety/equipment Toilet Transfer Details (indicate cue type and reason): Total A +2 for squat-pivot to BSC. Patient completes less than 25% of task. Toileting- Clothing Manipulation and Hygiene: Maximal assistance;Sit to/from stand Toileting - Clothing Manipulation Details (indicate cue type and reason): Max A for hygiene/clothing management in [place]. Patient able to stand for <30 sec at a time.       General ADL Comments: Patient greatly limited by decreased strength, severely decreased activity tolerance requiring frequent and extended rest breaks, and decreased cardiopulmonary endurance.     Vision Baseline Vision/History: Wears glasses Wears Glasses: At all times Patient Visual Report: No change from baseline       Perception     Praxis  Pertinent Vitals/Pain Pain Assessment: No/denies pain Pain Intervention(s): Monitored during session     Hand Dominance Right   Extremity/Trunk Assessment Upper Extremity Assessment Upper Extremity Assessment: Generalized weakness   Lower Extremity Assessment Lower  Extremity Assessment: Defer to PT evaluation   Cervical / Trunk Assessment Cervical / Trunk Assessment: Kyphotic   Communication Communication Communication: No difficulties   Cognition Arousal/Alertness: Awake/alert Behavior During Therapy: Flat affect Overall Cognitive Status: Impaired/Different from baseline Area of Impairment: Following commands;Problem solving                       Following Commands: Follows one step commands with increased time;Follows multi-step commands inconsistently     Problem Solving: Slow processing General Comments: Requires increased time to process verbal information.   General Comments  Wife present at bedside throughout. VSS on 3L O2 via [place].    Exercises     Shoulder Instructions      Home Living Family/patient expects to be discharged to:: Private residence Living Arrangements: Spouse/significant other Available Help at Discharge: Family;Available 24 hours/day Type of Home: Other(Comment) (townhouse) Home Access: Stairs to enter [employer] of Steps: 1 Entrance Stairs-Rails: Right Home Layout: One level     Bathroom Shower/Tub: Occupational psychologist: Handicapped height     Home Equipment: Environmental consultant - 2 wheels;Shower seat - built in;Grab bars - tub/shower          Prior Functioning/Environment Level of Independence: Independent with assistive device(s)        Comments: has been modified independent with RW since March 2021 after his aortic aneurysm repair (wife takes him to gym 3x/week)        OT Problem List: Decreased strength;Decreased activity tolerance;Impaired balance (sitting and/or standing);Decreased safety awareness;Decreased knowledge of use of DME or AE;Cardiopulmonary status limiting activity;Increased edema      OT Treatment/Interventions: Self-care/ADL training;Therapeutic exercise;Energy conservation;DME and/or AE instruction;Therapeutic activities;Patient/family  education;Balance training    OT Goals(Current goals can be found in the care plan section) Acute Rehab OT Goals Patient Stated Goal: to get strong enough to go home OT Goal Formulation: With patient Time For Goal Achievement: 12/28/20 Potential to Achieve Goals: Good ADL Goals Pt Will Perform Grooming: with set-up;sitting Pt Will Perform Upper Body Dressing: with set-up;sitting Pt Will Perform Lower Body Dressing: with min assist;sitting/lateral leans;sit to/from stand Pt Will Transfer to Toilet: ambulating;with min assist Pt Will Perform Toileting - Clothing Manipulation and hygiene: sit to/from stand;with min assist Pt/caregiver will Perform Home Exercise Program: Both right and left upper extremity;With written HEP provided Additional ADL Goal #1: Patient will maintain static sitting balance at EOB for 15 minutes in prep for ADLs.  OT Frequency: Min 2X/week   Barriers to D/C: Decreased caregiver support  Wife unable to provide current level of assist.       Co-evaluation              AM-PAC OT "6 Clicks" Daily Activity     Outcome Measure Help from another person eating meals?: A Little Help from another person taking care of personal grooming?: A Little Help from another person toileting, which includes using toliet, bedpan, or urinal?: Total Help from another person bathing (including washing, rinsing, drying)?: Total Help from another person to put on and taking off regular upper body clothing?: A Lot Help from another person to put on and taking off regular lower body clothing?: Total 6 Click Score: 11   End of Session Equipment Utilized During  Treatment: Gait belt;Other (comment) Charlaine Dalton) Nurse Communication: Mobility status;Other (comment) ([place] +2)  Activity Tolerance: Patient limited by lethargy Patient left: in chair;with call bell/phone within reach;with chair alarm set  OT Visit Diagnosis: Unsteadiness on feet (R26.81);Other abnormalities of gait and  mobility (R26.89);Muscle weakness (generalized) (M62.81);History of falling (Z91.81)                Time: 9249-3241 OT Time Calculation (min): 38 min Charges:  OT General Charges $OT Visit: 1 Visit OT Evaluation $OT Eval Moderate Complexity: 1 Mod OT Treatments $Self Care/Home Management : 8-22 mins  Kischa Altice H. OTR/L Supplemental OT, Department of rehab services [phone]  Sheneika Walstad R H. 12/14/2020, 12:10 PM

## 2020-12-14 NOTE — Progress Notes (Signed)
Inpatient Rehab Admissions Coordinator:   Met with patient at bedside to discuss potential CIR admission. Pt. Stated interest. Will pursue for potential admit this week pending bed availability and insurance authorization.   Clemens Catholic, [place], [place] Admissions Coordinator  [phone] ([place]) [phone] (office)

## 2020-12-14 NOTE — Progress Notes (Signed)
Pt BP is was 150/101 notified Dr. Wyline Copas. All other vitals were good.

## 2020-12-14 NOTE — Progress Notes (Signed)
Pt states pain in abdominal area has now subsided.

## 2020-12-14 NOTE — Care Management Important Message (Signed)
Important Message  Patient Details  Name: Andrew [name] MRN: [id number] Date of Birth: 05/06/1932   Medicare Important Message Given:  Yes     Huy Majid 12/14/2020, 4:00 PM

## 2020-12-14 NOTE — Progress Notes (Signed)
Pt continues to be candidate for CIR.  CSW will continue to follow. Lurline Idol, MSW, LCSW 5/24/20229:17 AM

## 2020-12-14 NOTE — Plan of Care (Signed)

## 2020-12-14 NOTE — Progress Notes (Signed)
Pt complaining of abdominal pain. Will let Dr. Wyline Copas know.

## 2020-12-14 NOTE — Progress Notes (Signed)
Nephrology Follow-Up note   Assessment/Recommendations: Andrew [name] is a/an 87 y.o. 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 fib, admitted for AKI      Nonoliguric AKI improving: Severe AKI possibly dehydration and ischemic ATN.  Imaging negative for obstruction but possibly played some role as well.   - No acute indication for HD however renal failure has been slow to improve more recently; continue supportive care - lasix once now   - continue foley catheter    Hypotension: Continue midodrine.  Further work-up per primary  Hypokalemia:  Potassium 20 meq PO once; repleted calcium and check mag again; replete mag if needed  Metabolic acidosis:  Improved; defer bicarbonate  Anemia secondary to iron deficiency as well as likely component of renal failure. Added oral iron daily.   aranesp 40 mcg once today   Disposition - continue inpatient monitoring for AKI   ________________________________________________________  Interval History/Subjective: He had 650 mL UOP over 5/23 charted.  He has been on 5 liters oxygen this am per charting and is on 2 liters on my arrival.  States that "breathing is terrible".  He states that he wouldn't want dialysis, even if that meant he might pass away.  I asked him to discuss this with his family.   Review of systems:    Reports some shortness of breath  Denies n/v Denies chest pain Not on home oxygen  Medications:  Current Facility-Administered Medications  Medication Dose Route Frequency Provider Last Rate Last Admin  . acetaminophen (TYLENOL) tablet 650 mg  650 mg Oral Q6H PRN Opyd, Ilene Qua, MD   650 mg at 12/10/20 2340   Or  . acetaminophen (TYLENOL) suppository 650 mg  650 mg Rectal Q6H PRN Opyd, Ilene Qua, MD      . albuterol (PROVENTIL) (2.5 MG/3ML) 0.083% nebulizer solution 2.5 mg  2.5 mg Nebulization Q2H PRN  Opyd, Ilene Qua, MD      . aspirin chewable tablet 81 mg  81 mg Oral QPC supper Donne Hazel, MD   81 mg at 12/13/20 1753  . atorvastatin (LIPITOR) tablet 20 mg  20 mg Oral Daily Opyd, Ilene Qua, MD   20 mg at 12/13/20 0817  . azithromycin (ZITHROMAX) 500 mg in sodium chloride 0.9 % 250 mL IVPB  500 mg Intravenous Q24H Vianne Bulls, MD 250 mL/hr at 12/13/20 2016 500 mg at 12/13/20 2016  . cefTRIAXone (ROCEPHIN) 2 g in sodium chloride 0.9 % 100 mL IVPB  2 g Intravenous Q24H Opyd, Ilene Qua, MD 200 mL/hr at 12/13/20 1938 2 g at 12/13/20 1938  . Chlorhexidine Gluconate Cloth 2 % PADS 6 each  6 each Topical Daily Donne Hazel, MD   6 each at 12/13/20 1130  . ferrous sulfate tablet 325 mg  325 mg Oral Q breakfast Claudia Desanctis, MD   325 mg at 12/13/20 0816  . midodrine (PROAMATINE) tablet 10 mg  10 mg Oral TID WC Rosita Fire, MD   10 mg at 12/13/20 1600  . ondansetron (ZOFRAN) tablet 4 mg  4 mg Oral Q6H PRN Opyd, Ilene Qua, MD       Or  . ondansetron (ZOFRAN) injection 4 mg  4 mg Intravenous Q6H PRN Vianne Bulls, MD          Physical Exam: Vitals:   12/13/20 2230 12/14/20 0405  BP: (!) 131/97 133/86  Pulse: 71  80  Resp: 16 19  Temp: 98 F (36.7 C) 98.6 F (37 C)  SpO2: 98% 96%   No intake/output data recorded.  Intake/Output Summary (Last 24 hours) at 12/14/2020 0725 Last data filed at 12/14/2020 9381 Gross per 24 hour  Intake 2225.28 ml  Output 650 ml  Net 1575.28 ml   General elderly male in bed in no acute distress  HEENT normocephalic atraumatic extraocular movements intact sclera anicteric Neck supple trachea midline Lungs basilar crackles normal work of breathing at rest; on 2 liters oxygen Heart S1S2 no rub Abdomen soft nontender nondistended Extremities trace to 1+ edema right leg and trace edema left leg  Psych normal mood and affect Neuro - alert and oriented x 3 provides hx and follows commands GU - has a foley     Test Results I personally  reviewed new and old clinical labs and radiology tests Lab Results  Component Value Date   NA 143 12/14/2020   K 3.5 12/14/2020   CL 106 12/14/2020   CO2 23 12/14/2020   BUN 108 (H) 12/14/2020   CREATININE 7.52 (H) 12/14/2020   CALCIUM 7.4 (L) 12/14/2020   ALBUMIN 2.3 (L) 12/11/2020   PHOS 8.2 (H) 12/11/2020    Claudia Desanctis, MD 12/14/2020 7:38 AM

## 2020-12-14 NOTE — Progress Notes (Signed)
PROGRESS NOTE    Andrew [name]  CZY:[id number] DOB: 03/29/1932 DOA: 12/10/2020 PCP: [name], MD    Brief Narrative:  87 y.o. male with medical history significant for thoracic aortic aneurysm status post stent graft repair, CABG in 2010, AAA, chronic diastolic CHF, BPH, urinary retention, CKD II, and remote atrial fibrillation not anticoagulated, now presenting to the emergency department for evaluation of abnormal blood work.  Patient reports that he has had a productive cough and mild malaise for roughly 2 weeks that did not improve with a course of Augmentin that he just completed.  He followed up with PCP, blood work was obtained, and he was directed to the ED for further evaluation of abnormal results.  Patient reports a loss of appetite for at least a week but denies any nausea, vomiting, diarrhea, abdominal pain, flank pain, or change in his urine.  Patient reports that his chronic bilateral ankle swelling is unchanged and notes that he does not take his Lasix and potassium supplement regularly.  CT the chest, abdomen, and pelvis is notable for new left upper lobe infiltrate, unchanged stent graft repair of thoracic aortic aneurysm, and grossly stable infrarenal AAA.  Patient was treated with Rocephin, azithromycin, albuterol, and IV fluids in the ED  Assessment & Plan:   Principal Problem:   AKI (acute kidney injury) ([place]) Active Problems:   Thoracic aortic aneurysm without rupture ([place])   Coronary artery disease   Urinary retention   AAA (abdominal aortic aneurysm) without rupture ([place])   Essential hypertension   Normocytic anemia   Chronic diastolic CHF (congestive heart failure) ([place])   Community acquired pneumonia  1. Acute renal failure superimposed on CKD II; uremia; metabolic acidosis   - Presented after evaluation of renal failure on outpatient blood work and is found to have BUN of 161 and SCr 8.71 (44 & 1.09 in August '21) with serum bicarb of 17 and normal  potassium  - Kidneys appear normal with no obstruction on CT in ED - He has had decreased appetite recently but no N/V/D; recently completed a course of Augmentin, denies any other new medications  -Appreciate input by Palliative Care who established goals of care. Pt's wishes are for full scope of care, including HD if needed -nephrology is following. Cr is slowly improving. O2 requirements are up to 5L this AM. Per Nephrology, discussion was made again for possibility for HD. Pt noted to be apprehensive about HD and is to discuss with family  2. Pneumonia  - Continues to have productive cough despite recent outpatient antibiotics  - New LUL infiltrate was noted on CT in ED  - Pt is now continued on Rocephin and azithromycin - Procalcitonin of 0.19, suggesting de-escalation of abx soon. Plan to complete total 5 days of abx  3. CAD  - No anginal complaints without chest pain thus far - ASA was initially on hold given below anemia. No evidence of acute blood loss, thus had resumed ASA  4. Chronic diastolic CHF  - Appears compensated  - Holding Lasix while hydrating, monitor weight and I/Os     5. Thoracic aortic aneurysm; AAA - Appears stable on CT in ED  - Would follow up with  vascular surgery follow-up on discharge   6. Iron deficiency Anemia  - Presenting Hgb is 9.4, down from 11.7 in March 2021  - Hgb noted to be down to 7.4 -Iron is mildly low at 26 -No obvious source of bleeding at this time -Hgb  now currently stable at 8.0 -Pt received aranesp per Nephrology  DVT prophylaxis: scd's Code Status: Full Family Communication: Pt in room, family is at bedside  Status is: Inpatient  Remains inpatient appropriate because:Inpatient level of care appropriate due to severity of illness   Dispo: The patient is from: Home              Anticipated d/c is to: Home              Patient currently is not medically stable to d/c.   Difficult to place patient No   Consultants:    Nephrology  Procedures:     Antimicrobials: Anti-infectives (From admission, onward)   Start     Dose/Rate Route Frequency Ordered Stop   12/11/20 2000  cefTRIAXone (ROCEPHIN) 2 g in sodium chloride 0.9 % 100 mL IVPB        2 g 200 mL/hr over 30 Minutes Intravenous Every 24 hours 12/10/20 2308 12/15/20 1959   12/11/20 2000  azithromycin (ZITHROMAX) 500 mg in sodium chloride 0.9 % 250 mL IVPB        500 mg 250 mL/hr over 60 Minutes Intravenous Every 24 hours 12/10/20 2308 12/15/20 1959   12/10/20 1945  cefTRIAXone (ROCEPHIN) 1 g in sodium chloride 0.9 % 100 mL IVPB        1 g 200 mL/hr over 30 Minutes Intravenous  Once 12/10/20 1940 12/10/20 2037   12/10/20 1945  azithromycin (ZITHROMAX) 500 mg in sodium chloride 0.9 % 250 mL IVPB        500 mg 250 mL/hr over 60 Minutes Intravenous  Once 12/10/20 1940 12/10/20 2142      Subjective: Without complaints at this time  Objective: Vitals:   12/13/20 2230 12/14/20 0405 12/14/20 0751 12/14/20 1155  BP: (!) 131/97 133/86    Pulse: 71 80    Resp: 16 19    Temp: 98 F (36.7 C) 98.6 F (37 C) 98.2 F (36.8 C) (!) 97.3 F (36.3 C)  TempSrc: Oral Oral    SpO2: 98% 96%    Weight:      Height:        Intake/Output Summary (Last 24 hours) at 12/14/2020 1403 Last data filed at 12/14/2020 1133 Gross per 24 hour  Intake 2225.28 ml  Output 800 ml  Net 1425.28 ml   Filed Weights   12/10/20 1551 12/11/20 0500  Weight: 65.3 kg 69.7 kg    Examination: General exam: Conversant, in no acute distress Respiratory system: normal chest rise, clear, no audible wheezing Cardiovascular system: regular rhythm, s1-s2 Gastrointestinal system: Nondistended, nontender, pos BS Central nervous system: No seizures, no tremors Extremities: No cyanosis, no joint deformities Skin: No rashes, no pallor Psychiatry: Affect normal // no auditory hallucinations   Data Reviewed: I have personally reviewed following labs and imaging  studies  CBC: Recent Labs  Lab 12/10/20 1713 12/10/20 2323 12/12/20 0051 12/13/20 0429 12/14/20 0043  WBC 11.8* 9.9 10.0 9.2 10.4  NEUTROABS 10.0*  --   --   --   --   HGB 9.4* 8.1* 7.4* 8.3* 8.0*  HCT 28.4* 23.9* 22.1* 25.5* 24.1*  MCV 90.2 90.2 90.2 92.4 92.3  PLT 169 169 168 190 297   Basic Metabolic Panel: Recent Labs  Lab 12/10/20 2323 12/11/20 1209 12/12/20 0051 12/13/20 0429 12/13/20 0444 12/14/20 0043  NA 137 141 139 142  --  143  K 3.4* 3.2* 3.0* 3.2*  --  3.5  CL 101 104 101 107  --  106  CO2 17* 22 21* 21*  --  23  GLUCOSE 114* 143* 117* 111*  --  114*  BUN 150* 138* 128* 113*  --  108*  CREATININE 9.32* 8.28* 7.81* 7.72*  --  7.52*  CALCIUM 7.1* 7.1* 6.6* 7.0*  --  7.4*  MG  --   --   --   --  1.4* 1.7  PHOS  --  8.2*  --   --   --   --    GFR: Estimated Creatinine Clearance: 6.8 mL/min (A) (by C-G formula based on SCr of 7.52 mg/dL (H)). Liver Function Tests: Recent Labs  Lab 12/10/20 1713 12/11/20 1209  AST 12*  --   ALT 9  --   ALKPHOS 66  --   BILITOT 0.3  --   PROT 7.3  --   ALBUMIN 3.5 2.3*   No results for input(s): LIPASE, AMYLASE in the last 168 hours. No results for input(s): AMMONIA in the last 168 hours. Coagulation Profile: No results for input(s): INR, PROTIME in the last 168 hours. Cardiac Enzymes: Recent Labs  Lab 12/10/20 1852  CKTOTAL 66   BNP (last 3 results) Recent Labs    01/09/20 1443  PROBNP 1,018*   HbA1C: No results for input(s): HGBA1C in the last 72 hours. CBG: No results for input(s): GLUCAP in the last 168 hours. Lipid Profile: No results for input(s): CHOL, HDL, LDLCALC, TRIG, CHOLHDL, LDLDIRECT in the last 72 hours. Thyroid Function Tests: No results for input(s): TSH, T4TOTAL, FREET4, T3FREE, THYROIDAB in the last 72 hours. Anemia Panel: No results for input(s): VITAMINB12, FOLATE, FERRITIN, TIBC, IRON, RETICCTPCT in the last 72 hours. Sepsis Labs: Recent Labs  Lab 12/10/20 2323 12/12/20 0051   PROCALCITON 0.19  0.19 0.19    Recent Results (from the past 240 hour(s))  Resp Panel by RT-PCR (Flu A&B, Covid) Nasopharyngeal Swab     Status: None   Collection Time: 12/10/20  5:13 PM   Specimen: Nasopharyngeal Swab; Nasopharyngeal(NP) swabs in vial transport medium  Result Value Ref Range Status   SARS Coronavirus 2 by RT PCR NEGATIVE NEGATIVE Final    Comment: (NOTE) SARS-CoV-2 target nucleic acids are NOT DETECTED.  The SARS-CoV-2 RNA is generally detectable in upper respiratory specimens during the acute phase of infection. The lowest concentration of SARS-CoV-2 viral copies this assay can detect is 138 copies/mL. A negative result does not preclude SARS-Cov-2 infection and should not be used as the sole basis for treatment or other patient management decisions. A negative result may occur with  improper specimen collection/handling, submission of specimen other than nasopharyngeal swab, presence of viral mutation(s) within the areas targeted by this assay, and inadequate number of viral copies(<138 copies/mL). A negative result must be combined with clinical observations, patient history, and epidemiological information. The expected result is Negative.  Fact Sheet for Patients:  EntrepreneurPulse.com.au  Fact Sheet for Healthcare Providers:  IncredibleEmployment.be  This test is no t yet approved or cleared by the [place] FDA and  has been authorized for detection and/or diagnosis of SARS-CoV-2 by FDA under an Emergency Use Authorization (EUA). This EUA will remain  in effect (meaning this test can be used) for the duration of the COVID-19 declaration under Section 564(b)(1) of the Act, 21 U.S.C.section 360bbb-3(b)(1), unless the authorization is terminated  or revoked sooner.       Influenza A by PCR NEGATIVE NEGATIVE Final   Influenza B by PCR NEGATIVE NEGATIVE Final    Comment: (  NOTE) The Xpert Xpress  SARS-CoV-2/FLU/RSV plus assay is intended as an aid in the diagnosis of influenza from Nasopharyngeal swab specimens and should not be used as a sole basis for treatment. Nasal washings and aspirates are unacceptable for Xpert Xpress SARS-CoV-2/FLU/RSV testing.  Fact Sheet for Patients: EntrepreneurPulse.com.au  Fact Sheet for Healthcare Providers: IncredibleEmployment.be  This test is not yet approved or cleared by the [place] FDA and has been authorized for detection and/or diagnosis of SARS-CoV-2 by FDA under an Emergency Use Authorization (EUA). This EUA will remain in effect (meaning this test can be used) for the duration of the COVID-19 declaration under Section 564(b)(1) of the Act, 21 U.S.C. section 360bbb-3(b)(1), unless the authorization is terminated or revoked.  Performed at [facility], [address] [place], [place] [address]      Radiology Studies: No results found.  Scheduled Meds: . aspirin  81 mg Oral QPC supper  . atorvastatin  20 mg Oral Daily  . Chlorhexidine Gluconate Cloth  6 each Topical Daily  . darbepoetin (ARANESP) injection - NON-DIALYSIS  40 mcg Subcutaneous Once  . ferrous sulfate  325 mg Oral Q breakfast  . midodrine  10 mg Oral TID WC   Continuous Infusions: . azithromycin 500 mg (12/13/20 2016)  . cefTRIAXone (ROCEPHIN)  IV 2 g (12/13/20 1938)     LOS: 4 days   Marylu Lund, MD [facility] Pager On Amion  If 7PM-7AM, please contact night-coverage 12/14/2020, 2:03 PM

## 2020-12-15 DIAGNOSIS — J189 Pneumonia, unspecified organism: Secondary | ICD-10-CM | POA: Diagnosis not present

## 2020-12-15 DIAGNOSIS — N179 Acute kidney failure, unspecified: Secondary | ICD-10-CM | POA: Diagnosis not present

## 2020-12-15 DIAGNOSIS — I5032 Chronic diastolic (congestive) heart failure: Secondary | ICD-10-CM | POA: Diagnosis not present

## 2020-12-15 DIAGNOSIS — I714 Abdominal aortic aneurysm, without rupture: Secondary | ICD-10-CM | POA: Diagnosis not present

## 2020-12-15 DIAGNOSIS — R339 Retention of urine, unspecified: Secondary | ICD-10-CM

## 2020-12-15 LAB — BASIC METABOLIC PANEL
Anion gap: 15 (ref 5–15)
BUN: 100 mg/dL — ABNORMAL HIGH (ref 8–23)
CO2: 22 mmol/L (ref 22–32)
Calcium: 8 mg/dL — ABNORMAL LOW (ref 8.9–10.3)
Chloride: 104 mmol/L (ref 98–111)
Creatinine, Ser: 7.08 mg/dL — ABNORMAL HIGH (ref 0.61–1.24)
GFR, Estimated: 7 mL/min — ABNORMAL LOW (ref >60)
Glucose, Bld: 102 mg/dL — ABNORMAL HIGH (ref 70–99)
Potassium: 3.4 mmol/L — ABNORMAL LOW (ref 3.5–5.1)
Sodium: 141 mmol/L (ref 135–145)

## 2020-12-15 LAB — CBC
HCT: 24.6 % — ABNORMAL LOW (ref 39.0–52.0)
Hemoglobin: 8.1 g/dL — ABNORMAL LOW (ref 13.0–17.0)
MCH: 30.6 pg (ref 26.0–34.0)
MCHC: 32.9 g/dL (ref 30.0–36.0)
MCV: 92.8 fL (ref 80.0–100.0)
Platelets: 215 10*3/uL (ref 150–400)
RBC: 2.65 MIL/uL — ABNORMAL LOW (ref 4.22–5.81)
RDW: 14.1 % (ref 11.5–15.5)
WBC: 10.1 10*3/uL (ref 4.0–10.5)
nRBC: 0 % (ref 0.0–0.2)

## 2020-12-15 LAB — CYTOLOGY - NON PAP

## 2020-12-15 MED ORDER — HEPARIN SODIUM (PORCINE) 5000 UNIT/ML IJ SOLN
5000.0000 [IU] | Freq: Three times a day (TID) | INTRAMUSCULAR | Status: DC
  Administered 2020-12-15 – 2021-01-09 (×71): 5000 [IU] via SUBCUTANEOUS
  Filled 2020-12-15 (×72): qty 1

## 2020-12-15 MED ORDER — MIDODRINE HCL 5 MG PO TABS
5.0000 mg | ORAL_TABLET | Freq: Three times a day (TID) | ORAL | Status: DC
  Administered 2020-12-15 – 2020-12-16 (×3): 5 mg via ORAL
  Filled 2020-12-15 (×3): qty 1

## 2020-12-15 MED ORDER — FUROSEMIDE 10 MG/ML IJ SOLN
80.0000 mg | Freq: Once | INTRAMUSCULAR | Status: AC
  Administered 2020-12-15: 80 mg via INTRAVENOUS
  Filled 2020-12-15: qty 8

## 2020-12-15 MED ORDER — POTASSIUM CHLORIDE CRYS ER 20 MEQ PO TBCR
40.0000 meq | EXTENDED_RELEASE_TABLET | Freq: Once | ORAL | Status: AC
  Administered 2020-12-15: 40 meq via ORAL
  Filled 2020-12-15: qty 2

## 2020-12-15 NOTE — Progress Notes (Signed)
Palliative:  We continue to be involved peripherally. At initial discussion on 5/21 patient/family resistant to any advance care planning. Continuing to review chart - appears patient has made decision he would not want to pursue HD. Discussed with Dr. Bonner Puna today; per his recommendation, will hold off on engaging with family again at this time. Follow up again later in week for palliative needs.  Juel Burrow, DNP, AGNP-C Palliative Medicine Team Team Phone # [phone]  Pager # [phone]   NO CHARGE

## 2020-12-15 NOTE — Progress Notes (Signed)
Physical Therapy Treatment Patient Details Name: Andrew [name] MRN: [id number] DOB: 10/24/1931 Today's Date: 12/15/2020    History of Present Illness 87 y.o. male presented 12/10/20 to the emergency department for evaluation of abnormal blood work. Acute renal failure with uremia;  CT the chest, abdomen, and pelvis is notable for pneumonia PMH- thoracic aortic aneurysm status post stent graft repair, CABG in 2010, AAA, chronic diastolic CHF, BPH, urinary retention, CKD II, and remote atrial fibrillation not anticoagulated, back surgery    PT Comments    Patient agreeable to therapy and making progress towards all goals except ambulation. Pt with ?asterixis in bil LEs in weight-bearing with knee buckling and recovery ("release and catch" of quadriceps). Stood a total of 4 times during session (from bed, BSC, stedy seat, recliner) with improving ability to coordinate use of UEs to boost his hips off the surface. Continue to feel he will make good progress on CIR unit.      Follow Up Recommendations  CIR     Equipment Recommendations  Wheelchair (measurements PT);Wheelchair cushion (measurements PT);Hospital bed (if discharging home without rehab)    Recommendations for Other Services       Precautions / Restrictions Precautions Precautions: Fall    Mobility  Bed Mobility Overal bed mobility: Needs Assistance Bed Mobility: Rolling;Sidelying to Sit Rolling: Min assist Sidelying to sit: Min assist;HOB elevated (with rail)       General bed mobility comments: using rail; vc for technique as he initially tried to pull straight up from supine without success    Transfers Overall transfer level: Needs assistance Equipment used: Rolling walker (2 wheeled) Transfers: Sit to/from [employer] Transfers Sit to Stand: +2 safety/equipment;From elevated surface;Mod assist;Min assist Stand pivot transfers: +2 physical assistance;+2 safety/equipment;Max assist       General  transfer comment: initial transfer bed to [facility] with RW with +2 max assist with pt having urgency to have a BM--poorly moving his feet; from [facility] used stedy for sit to stand and transfer to recliner (min assist); stood again from recliner mod assist of 2 pt with fatigue and expressed anxiety due to weak legs  Ambulation/Gait             General Gait Details: not safe as pt not moving feet during stand-pivot   Stairs             Wheelchair Mobility    Modified Rankin (Stroke Patients Only)       Balance Overall balance assessment: Needs assistance Sitting-balance support: Bilateral upper extremity supported;Feet supported Sitting balance-Leahy Scale: Poor Sitting balance - Comments: Supervision A at best     Standing balance-Leahy Scale: Poor Standing balance comment: Reliant on +2 external assist.                            Cognition Arousal/Alertness: Awake/alert Behavior During Therapy: Flat affect Overall Cognitive Status: Impaired/Different from baseline Area of Impairment: Following commands;Problem solving                       Following Commands: Follows one step commands with increased time;Follows multi-step commands inconsistently;Follows one step commands consistently     Problem Solving: Slow processing;Decreased initiation;Requires verbal cues General Comments: Requires increased time to process verbal information.      Exercises General Exercises - Lower Extremity Long Arc Quad: AROM;Both;5 reps;Seated (emphasizing ankle DF and hold x 3 seconds)    General Comments General comments (  skin integrity, edema, etc.): wife and daughter present and encouraging pt to participate despite fatigue      Pertinent Vitals/Pain      Home Living                      Prior Function            PT Goals (current goals can now be found in the care plan section) Acute Rehab PT Goals Patient Stated Goal: to get strong enough to  go home Time For Goal Achievement: 12/27/20 Potential to Achieve Goals: Good Progress towards PT goals: Progressing toward goals    Frequency    Min 3X/week      PT Plan Current plan remains appropriate    Co-evaluation              AM-PAC PT "6 Clicks" Mobility   Outcome Measure  Help needed turning from your back to your side while in a flat bed without using bedrails?: A Lot Help needed moving from lying on your back to sitting on the side of a flat bed without using bedrails?: A Lot Help needed moving to and from a bed to a chair (including a wheelchair)?: Total Help needed standing up from a chair using your arms (e.g., wheelchair or bedside chair)?: Total Help needed to walk in hospital room?: Total Help needed climbing 3-5 steps with a railing? : Total 6 Click Score: 8    End of Session Equipment Utilized During Treatment: Gait belt;Oxygen Activity Tolerance: Patient limited by fatigue Patient left: with call bell/phone within reach;with family/visitor present;in chair;with chair alarm set Nurse Communication: Mobility status;Need for lift equipment (stedy) PT Visit Diagnosis: Muscle weakness (generalized) (M62.81);Difficulty in walking, not elsewhere classified (R26.2)     Time: 6045-4098 PT Time Calculation (min) (ACUTE ONLY): 44 min  Charges:  $Therapeutic Activity: 38-52 mins                      Arby Barrette, PT Pager [phone]    Rexanne Mano 12/15/2020, 11:53 AM

## 2020-12-15 NOTE — Plan of Care (Signed)

## 2020-12-15 NOTE — Progress Notes (Signed)
Nephrology Follow-Up note   Assessment/Recommendations: Andrew [name] is a/an 87 y.o. 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 fib, admitted for AKI     Nonoliguric AKI improving: Severe AKI possibly dehydration and ischemic ATN.  Imaging negative for obstruction but possibly played some role as well given known of of urinary retention.  Baseline Cr 1-1.2 from 02/2020 - He has indicated that he would not want to pursue dialysis but we are actively making changes requiring inpatient monitoring - Renal failure has been slow to improve more recently; continue supportive care - renal panel in AM - on midodrine to optimize blood pressure - lasix once now.  Normally on lasix 80 mg daily at home   - continue foley catheter    Hypotension: Continue midodrine.  Further work-up per primary  Hypokalemia:  Potassium 40 meq PO once now ; earlier repleted calcium; mag improved. Recheck in am  Metabolic acidosis:  Improved; defer bicarbonate  Anemia secondary to iron deficiency as well as likely component of renal failure. Added oral iron daily.  aranesp 40 mcg once on 5/24   Disposition - continue inpatient monitoring for AKI   ________________________________________________________  Interval History/Subjective: He had 2.8 L UOP over 5/24 charted. Got lasix on 5/24.  He has stated that he wouldn't want dialysis, even if that meant he might pass away.  I spoke with his wife via phone at his bedside to update her on 5/25 am and she appreciated the call.  They've been married over 63 years.   Review of systems:     Reports still some shortness of breath - breathing about the same Denies n/v Denies chest pain Not on home oxygen  Medications:  Current Facility-Administered Medications  Medication Dose Route Frequency Provider Last Rate Last Admin  . acetaminophen (TYLENOL)  tablet 650 mg  650 mg Oral Q6H PRN Opyd, Ilene Qua, MD   650 mg at 12/10/20 2340   Or  . acetaminophen (TYLENOL) suppository 650 mg  650 mg Rectal Q6H PRN Opyd, Ilene Qua, MD      . albuterol (PROVENTIL) (2.5 MG/3ML) 0.083% nebulizer solution 2.5 mg  2.5 mg Nebulization Q2H PRN Opyd, Ilene Qua, MD      . aspirin chewable tablet 81 mg  81 mg Oral QPC supper Donne Hazel, MD   81 mg at 12/14/20 1754  . atorvastatin (LIPITOR) tablet 20 mg  20 mg Oral Daily Opyd, Ilene Qua, MD   20 mg at 12/14/20 1018  . Chlorhexidine Gluconate Cloth 2 % PADS 6 each  6 each Topical Daily Donne Hazel, MD   6 each at 12/14/20 1220  . ferrous sulfate tablet 325 mg  325 mg Oral Q breakfast Claudia Desanctis, MD   325 mg at 12/14/20 1001  . midodrine (PROAMATINE) tablet 10 mg  10 mg Oral TID WC Rosita Fire, MD   10 mg at 12/14/20 1550  . ondansetron (ZOFRAN) tablet 4 mg  4 mg Oral Q6H PRN Opyd, Ilene Qua, MD       Or  . ondansetron (ZOFRAN) injection 4 mg  4 mg Intravenous Q6H PRN Vianne Bulls, MD          Physical Exam: Vitals:   12/14/20 2300 12/15/20 0348  BP: (!) 145/95 111/78  Pulse: 64 69  Resp: 13 14  Temp:    SpO2: 97% 93%   No intake/output  data recorded.  Intake/Output Summary (Last 24 hours) at 12/15/2020 0721 Last data filed at 12/15/2020 0548 Gross per 24 hour  Intake --  Output 2800 ml  Net -2800 ml   General elderly male in bed in no acute distress  HEENT normocephalic atraumatic extraocular movements intact sclera anicteric Neck supple trachea midline Lungs basilar crackles unlabored at rest; on 4 liters oxygen Heart S1S2 no rub Abdomen soft nontender nondistended Extremities trace edema right leg and trace edema left leg  Psych normal mood and affect Neuro - alert and oriented x 3 provides hx and follows commands GU - has a foley     Test Results I personally reviewed new and old clinical labs and radiology tests Lab Results  Component Value Date   NA 141  12/15/2020   K 3.4 (L) 12/15/2020   CL 104 12/15/2020   CO2 22 12/15/2020   BUN 100 (H) 12/15/2020   CREATININE 7.08 (H) 12/15/2020   CALCIUM 8.0 (L) 12/15/2020   ALBUMIN 2.3 (L) 12/11/2020   PHOS 8.2 (H) 12/11/2020    Claudia Desanctis, MD 12/15/2020 7:30 AM

## 2020-12-15 NOTE — Progress Notes (Signed)
Inpatient Rehab Admissions Coordinator:   I do not have a bed or insurance auth for this pt. For CIR today. I anticipate a response from Pt.'s insurance today or tomorrow. I will follow for potential admit pending insurance and bed availability.   Clemens Catholic, [place], [place] Admissions Coordinator  [phone] ([place]) [phone] (office)

## 2020-12-15 NOTE — TOC Initial Note (Signed)
Transition of Care [facility]) - Initial/Assessment Note    Patient Details  Name: Andrew [name] MRN: [id number] Date of Birth: 1931-11-19  [facility]) CM/SW Contact:    Joanne Chars, LCSW Phone Number: 12/15/2020, 3:16 PM  Clinical Narrative:    CSW met with pt and wife in room, permission given to speak with wife present.Marland Kitchen  CIR requesting insurance authorization but reports they anticipate a possible denial, CSW discussed initiating SNF referral as backup plan. Pt and wife agreeable to this, choice document provided. Current equipment in home: walker, shower bench.  Pt is vaccinated for covid and boosted.               Expected Discharge Plan: IP Rehab Facility Barriers to Discharge: Insurance Authorization,Continued Medical Work up   Patient Goals and CMS Choice Patient states their goals for this hospitalization and ongoing recovery are:: walking CMS Medicare.gov Compare Post Acute Care list provided to:: Patient Represenative (must comment) Choice offered to / list presented to : Spouse  Expected Discharge Plan and Services Expected Discharge Plan: [place]     Post Acute Care Choice: IP Rehab Living arrangements for the past 2 months: Single Family Home                                      Prior Living Arrangements/Services Living arrangements for the past 2 months: Single Family Home Lives with:: Spouse Patient language and need for interpreter reviewed:: Yes Do you feel safe going back to the place where you live?: Yes      Need for Family Participation in Patient Care: Yes (Comment) Care giver support system in place?: Yes (comment) Current home services: Other (comment) (none) Criminal Activity/Legal Involvement Pertinent to Current Situation/Hospitalization: No - Comment as needed  Activities of Daily Living Home Assistive Devices/Equipment: None ADL Screening (condition at time of admission) Patient's cognitive ability adequate  to safely complete daily activities?: Yes Is the patient deaf or have difficulty hearing?: No Does the patient have difficulty seeing, even when wearing glasses/contacts?: No Does the patient have difficulty concentrating, remembering, or making decisions?: No Patient able to express need for assistance with ADLs?: Yes Does the patient have difficulty dressing or bathing?: No Independently performs ADLs?: Yes (appropriate for developmental age) Does the patient have difficulty walking or climbing stairs?: Yes Weakness of Legs: None Weakness of Arms/Hands: None  Permission Sought/Granted Permission sought to share information with : Family Chief Financial Officer Permission granted to share information with : Yes, Verbal Permission Granted  Share Information with NAME: wife [name]  Permission granted to share info w AGENCY: SNF        Emotional Assessment Appearance:: Appears stated age Attitude/Demeanor/Rapport: Engaged Affect (typically observed): Pleasant Orientation: : Oriented to Self,Oriented to Place,Oriented to  Time,Oriented to Situation Alcohol / Substance Use: Not Applicable Psych Involvement: No (comment)  Admission diagnosis:  Cough [R05.9] AKI (acute kidney injury) ([place]) [N17.9] Patient Active Problem List   Diagnosis Date Noted  . AKI (acute kidney injury) ([place]) 12/10/2020  . Normocytic anemia 12/10/2020  . Chronic diastolic CHF (congestive heart failure) ([place]) 12/10/2020  . Community acquired pneumonia 12/10/2020  . Chest pain 09/26/2019  . Aneurysm of thoracic aorta ([place]) 09/22/2019  . AAA (abdominal aortic aneurysm) without rupture ([place]) 04/29/2019  . Pain in left knee 12/17/2018  . Prepatellar bursitis of left knee 12/17/2018  . Lower urinary tract  infectious disease   . Acute UTI 09/20/2018  . Coronary artery disease 09/20/2018  . Urinary retention 09/20/2018  . Thoracic aortic aneurysm without rupture ([place]) 07/12/2018  . Bilateral  nonexudative age-related macular degeneration 12/24/2017  . Fuchs' corneal dystrophy 12/24/2017  . Pseudophakia of both eyes 12/24/2017  . S/P YAG capsulotomy, bilateral 12/24/2017  . Epigastric hernia 09/11/2017  . ED (erectile dysfunction) of organic origin 02/21/2016  . Allergic reaction 06/01/2015  . Angioedema 06/01/2015  . S/P orthopedic surgery, follow-up exam 02/09/2015  . Tendinosis 12/15/2014  . Spermatocele 03/23/2014  . Spondylolisthesis of lumbar region 03/09/2014  . Essential hypertension 10/28/2013  . Lumbar radiculopathy 10/01/2013  . Spinal stenosis 08/05/2013  . 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/28/2013  . Neurogenic bladder disorder 07/28/2013  . 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 Benign prostate hyperplasia 02/17/2013  . Left varicocele 02/12/2012  . Pain in testicle 02/12/2012   PCP:  [name], MD Pharmacy:   [facility], [place] [address] Phone: [phone] Fax: [phone]     Social Determinants of Health (SDOH) Interventions    Readmission Risk Interventions No flowsheet data found.

## 2020-12-15 NOTE — NC FL2 (Signed)
[place] LEVEL OF CARE SCREENING TOOL     IDENTIFICATION  Patient Name: Andrew [name] Birthdate: 1932/02/02 Sex: male Admission Date (Current Location): 12/10/2020  [facility] and [place] Number:  Herbalist and Address:  [facility], [place] [address] [place], [place] [address]      Provider Number: [id number]  Attending Physician Name and Address:  Patrecia Pour, MD  Relative Name and Phone Number:  [place], [place] [phone]  [phone]    Current Level of Care: Hospital Recommended Level of Care: [place] Prior Approval Number:    Date Approved/Denied:   PASRR Number: [id number]  Discharge Plan: SNF    Current Diagnoses: Patient Active Problem List   Diagnosis Date Noted  . AKI (acute kidney injury) ([place]) 12/10/2020  . Normocytic anemia 12/10/2020  . Chronic diastolic CHF (congestive heart failure) ([place]) 12/10/2020  . Community acquired pneumonia 12/10/2020  . Chest pain 09/26/2019  . Aneurysm of thoracic aorta ([place]) 09/22/2019  . AAA (abdominal aortic aneurysm) without rupture ([place]) 04/29/2019  . Pain in left knee 12/17/2018  . Prepatellar bursitis of left knee 12/17/2018  . Lower urinary tract infectious disease   . Acute UTI 09/20/2018  . Coronary artery disease 09/20/2018  . Urinary retention 09/20/2018  . Thoracic aortic aneurysm without rupture ([place]) 07/12/2018  . Bilateral nonexudative age-related macular degeneration 12/24/2017  . Fuchs' corneal dystrophy 12/24/2017  . Pseudophakia of both eyes 12/24/2017  . S/P YAG capsulotomy, bilateral 12/24/2017  . Epigastric hernia 09/11/2017  . ED (erectile dysfunction) of organic origin 02/21/2016  . Allergic reaction 06/01/2015  . Angioedema 06/01/2015  . S/P orthopedic surgery, follow-up exam 02/09/2015  . Tendinosis 12/15/2014  . Spermatocele 03/23/2014  . Spondylolisthesis of lumbar region 03/09/2014  . Essential hypertension  10/28/2013  . Lumbar radiculopathy 10/01/2013  . Spinal stenosis 08/05/2013  . 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/28/2013  . Neurogenic bladder disorder 07/28/2013  . 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 Benign prostate hyperplasia 02/17/2013  . Left varicocele 02/12/2012  . Pain in testicle 02/12/2012    Orientation RESPIRATION BLADDER Height & Weight     Self,Time,Situation,Place  O2 Incontinent Weight: 153 lb 10.6 oz (69.7 kg) Height:  5\' 8"  (172.7 cm)  BEHAVIORAL SYMPTOMS/MOOD NEUROLOGICAL BOWEL NUTRITION STATUS      Incontinent Diet (see discharge summary)  AMBULATORY STATUS COMMUNICATION OF NEEDS Skin   Limited Assist Verbally Normal                       Personal Care Assistance Level of Assistance  Bathing,Feeding,Dressing Bathing Assistance: Maximum assistance Feeding assistance: Limited assistance Dressing Assistance: Maximum assistance     Functional Limitations Info  Sight,Hearing,Speech Sight Info: Adequate Hearing Info: Adequate Speech Info: Adequate    SPECIAL CARE FACTORS FREQUENCY  PT (By licensed PT),OT (By licensed OT)     PT Frequency: 5x week OT Frequency: 5x week            Contractures Contractures Info: Not present    Additional Factors Info  Code Status,Allergies Code Status Info: full Allergies Info: Quinolones, Naltrexone           Current Medications (12/15/2020):  This is the current hospital active medication list Current Facility-Administered Medications  Medication Dose Route Frequency Provider Last Rate Last Admin  . acetaminophen (TYLENOL) tablet 650 mg  650 mg Oral Q6H PRN Opyd, Ilene Qua, MD   650 mg at 12/10/20 2340   Or  . acetaminophen (  TYLENOL) suppository 650 mg  650 mg Rectal Q6H PRN Opyd, Ilene Qua, MD      . albuterol (PROVENTIL) (2.5 MG/3ML) 0.083% nebulizer solution 2.5 mg  2.5 mg Nebulization Q2H PRN Opyd, Ilene Qua, MD      . aspirin chewable tablet 81 mg  81 mg Oral QPC supper Donne Hazel, MD   81 mg at 12/14/20  1754  . atorvastatin (LIPITOR) tablet 20 mg  20 mg Oral Daily Opyd, Ilene Qua, MD   20 mg at 12/15/20 0910  . Chlorhexidine Gluconate Cloth 2 % PADS 6 each  6 each Topical Daily Donne Hazel, MD   6 each at 12/15/20 [phone]  . ferrous sulfate tablet 325 mg  325 mg Oral Q breakfast Claudia Desanctis, MD   325 mg at 12/15/20 0911  . heparin injection 5,000 Units  5,000 Units Subcutaneous Q8H Patrecia Pour, MD   5,000 Units at 12/15/20 1402  . midodrine (PROAMATINE) tablet 5 mg  5 mg Oral TID WC Vance Gather B, MD      . ondansetron (ZOFRAN) tablet 4 mg  4 mg Oral Q6H PRN Opyd, Ilene Qua, MD       Or  . ondansetron (ZOFRAN) injection 4 mg  4 mg Intravenous Q6H PRN Opyd, Ilene Qua, MD         Discharge Medications: Please see discharge summary for a list of discharge medications.  Relevant Imaging Results:  Relevant Lab Results:   Additional Information SSN: [id number].  Pt is vaccinated for covid and boosted.  Joanne Chars, LCSW

## 2020-12-15 NOTE — Progress Notes (Signed)
PROGRESS NOTE  Andrew [name]  WUJ:[id number] DOB: October 28, 1931 DOA: 12/10/2020 PCP: [name], MD  Outpatient Specialists: Vascular surgery, Dr. Carlis Abbott Brief Narrative: Andrew [name] is an 87 y.o.malewith a history of thoracic aortic aneurysm status post stent graft repair, AAA, CABG in 2010, chronic HFpEF, BPH with urinary retention, CKDII,and remote atrial fibrillation not anticoagulated who presented to the ED 5/20 prompted by PCP for abnormal blood work which was drawn in the setting of cough that hadn't improved with augmentin. He also had been losing appetite and feeling unwell.  Labs were notable for mild leukocytosis, worsening anemia, renal failure. CT the chest, abdomen, and pelvis is notable for new left upper lobe infiltrate, unchanged stent graft repair of thoracic aortic aneurysm, and grossly stable infrarenal AAA. Patient was treated with rocephin, azithromycin, albuterol, and IV fluids in the ED. SBP nadir was in 70's shortly after admission. Creatinine was 8.71 worsening to 9.32 from previous of 1.09 in Aug 2021. Nephrology was consulted, confirmed patient's desire to forgo dialysis even if life-saving. Palliative care following along as well.   Assessment & Plan: Principal Problem:   AKI (acute kidney injury) ([place]) Active Problems:   Thoracic aortic aneurysm without rupture ([place])   Coronary artery disease   Urinary retention   AAA (abdominal aortic aneurysm) without rupture ([facility])   Essential hypertension   Normocytic anemia   Chronic diastolic CHF (congestive heart failure) ([place])   Community acquired pneumonia  Acute renal failure superimposed on CKD II; uremia; metabolic acidosis: Initial BUN 161, Cr 8.71 (44 &1.09 in Aug 2021) with serum bicarb of 17. Kidneys appear normal with no obstruction on CT in ED. Relatively bland micro and proteinuria.  -Appreciate nephrology assistance. Repeating lasix this AM. Monitoring I/O, daily renal function panel.  Palliative care consulted, d/w NP that family wishes are clear at this time to avoid HD but otherwise pursue all treatments with curative intent and rehabilitation at the highest level offered.  - Avoiding hypotension with midodrine.  - Avoiding nephrotoxins.   Hypokalemia:  - Repeat supplementation again today especially in light of redosing lasix.  LUL CAP: Has completed 5 days oc ceftriaxone, azithromycin.   Hypotension: Improving. - Decrease midodrine.   CAD: History of PCI and CABG 2010. No anginal complaints currently.  -Continue ASA, statin  Chronic HFpEF: Peripherally slightly volume up. Initially given IVF in setting of pneumonia, though now diuresing.  - Continue lasix (repeat this AM), monitoring I/O, daily weights (ordered, not charted).   Thoracic aortic aneurysm s/p repair:   AAA: Appears stable on CT in ED -Keep VVS appointment with repeat U/S on 6/21 with Dr. Carlis Abbott.   Iron deficiency anemia: No acute bleeding noted. s/p aranesp per nephrology. - Monitor intermittently.  - Daily iron supplement.  DVT prophylaxis: SCDs, will start heparin with no bleeding and stable hgb.  Code Status: Full Family Communication: Wife at bedside Disposition Plan:  Status is: Inpatient  Remains inpatient appropriate because:Persistent severe electrolyte disturbances   Dispo: The patient is from: Home              Anticipated d/c is to: CIR              Patient currently is not medically stable to d/c.   Difficult to place patient No  Consultants:   Nephrology  Palliative care  CIR  Procedures:   None  Antimicrobials:  Ceftriaxone, azithromycin   Subjective: Feels fine, no dyspnea or pain. Urinated 2.8L over past 24 hours. Wife  reports he's sleeping all the time.  Objective: Vitals:   12/14/20 2210 12/14/20 2300 12/15/20 0348 12/15/20 0754  BP: (!) 149/91 (!) 145/95 111/78   Pulse:  64 69 (P) 76  Resp:  13 14   Temp:    (P) 99.5 F (37.5 C)   TempSrc:    (P) Oral  SpO2:  97% 93%   Weight:      Height:        Intake/Output Summary (Last 24 hours) at 12/15/2020 1154 Last data filed at 12/15/2020 0548 Gross per 24 hour  Intake --  Output 2000 ml  Net -2000 ml   Filed Weights   12/10/20 1551 12/11/20 0500  Weight: 65.3 kg 69.7 kg    Gen: Frail, elderly male in no acute distress Pulm: Non-labored breathing. Scant bibasilar crackles. CV: Regular rate and rhythm. No murmur, rub, or gallop. No JVD, + pedal edema. GI: Abdomen soft, non-tender, non-distended, with normoactive bowel sounds. No organomegaly or masses felt. Ext: Warm, no deformities Skin: No rashes, lesions or ulcers on visualized skin Neuro: Alert and oriented. No focal neurological deficits. Psych: Judgement and insight appear fair, bradyphrenic. Mood & affect appropriate.   Data Reviewed: I have personally reviewed following labs and imaging studies  CBC: Recent Labs  Lab 12/10/20 1713 12/10/20 2323 12/12/20 0051 12/13/20 0429 12/14/20 0043 12/15/20 0303  WBC 11.8* 9.9 10.0 9.2 10.4 10.1  NEUTROABS 10.0*  --   --   --   --   --   HGB 9.4* 8.1* 7.4* 8.3* 8.0* 8.1*  HCT 28.4* 23.9* 22.1* 25.5* 24.1* 24.6*  MCV 90.2 90.2 90.2 92.4 92.3 92.8  PLT 169 169 168 190 200 710   Basic Metabolic Panel: Recent Labs  Lab 12/11/20 1209 12/12/20 0051 12/13/20 0429 12/13/20 0444 12/14/20 0043 12/15/20 0303  NA 141 139 142  --  143 141  K 3.2* 3.0* 3.2*  --  3.5 3.4*  CL 104 101 107  --  106 104  CO2 22 21* 21*  --  23 22  GLUCOSE 143* 117* 111*  --  114* 102*  BUN 138* 128* 113*  --  108* 100*  CREATININE 8.28* 7.81* 7.72*  --  7.52* 7.08*  CALCIUM 7.1* 6.6* 7.0*  --  7.4* 8.0*  MG  --   --   --  1.4* 1.7  --   PHOS 8.2*  --   --   --   --   --    GFR: Estimated Creatinine Clearance: 7.2 mL/min (A) (by C-G formula based on SCr of 7.08 mg/dL (H)). Liver Function Tests: Recent Labs  Lab 12/10/20 1713 12/11/20 1209  AST 12*  --   ALT 9  --    ALKPHOS 66  --   BILITOT 0.3  --   PROT 7.3  --   ALBUMIN 3.5 2.3*   No results for input(s): LIPASE, AMYLASE in the last 168 hours. No results for input(s): AMMONIA in the last 168 hours. Coagulation Profile: No results for input(s): INR, PROTIME in the last 168 hours. Cardiac Enzymes: Recent Labs  Lab 12/10/20 1852  CKTOTAL 66   BNP (last 3 results) Recent Labs    01/09/20 1443  PROBNP 1,018*   HbA1C: No results for input(s): HGBA1C in the last 72 hours. CBG: No results for input(s): GLUCAP in the last 168 hours. Lipid Profile: No results for input(s): CHOL, HDL, LDLCALC, TRIG, CHOLHDL, LDLDIRECT in the last 72 hours. Thyroid Function Tests: No results  for input(s): TSH, T4TOTAL, FREET4, T3FREE, THYROIDAB in the last 72 hours. Anemia Panel: No results for input(s): VITAMINB12, FOLATE, FERRITIN, TIBC, IRON, RETICCTPCT in the last 72 hours. Urine analysis:    Component Value Date/Time   COLORURINE STRAW (A) 12/10/2020 1713   APPEARANCEUR CLEAR 12/10/2020 1713   LABSPEC 1.015 12/10/2020 1713   PHURINE 5.5 12/10/2020 1713   GLUCOSEU NEGATIVE 12/10/2020 1713   HGBUR MODERATE (A) 12/10/2020 1713   BILIRUBINUR NEGATIVE 12/10/2020 1713   KETONESUR NEGATIVE 12/10/2020 1713   PROTEINUR 30 (A) 12/10/2020 1713   UROBILINOGEN 0.2 06/09/2009 0918   NITRITE NEGATIVE 12/10/2020 1713   LEUKOCYTESUR NEGATIVE 12/10/2020 1713   Recent Results (from the past 240 hour(s))  Resp Panel by RT-PCR (Flu A&B, Covid) Nasopharyngeal Swab     Status: None   Collection Time: 12/10/20  5:13 PM   Specimen: Nasopharyngeal Swab; Nasopharyngeal(NP) swabs in vial transport medium  Result Value Ref Range Status   SARS Coronavirus 2 by RT PCR NEGATIVE NEGATIVE Final    Comment: (NOTE) SARS-CoV-2 target nucleic acids are NOT DETECTED.  The SARS-CoV-2 RNA is generally detectable in upper respiratory specimens during the acute phase of infection. The lowest concentration of SARS-CoV-2 viral copies  this assay can detect is 138 copies/mL. A negative result does not preclude SARS-Cov-2 infection and should not be used as the sole basis for treatment or other patient management decisions. A negative result may occur with  improper specimen collection/handling, submission of specimen other than nasopharyngeal swab, presence of viral mutation(s) within the areas targeted by this assay, and inadequate number of viral copies(<138 copies/mL). A negative result must be combined with clinical observations, patient history, and epidemiological information. The expected result is Negative.  Fact Sheet for Patients:  EntrepreneurPulse.com.au  Fact Sheet for Healthcare Providers:  IncredibleEmployment.be  This test is no t yet approved or cleared by the [place] FDA and  has been authorized for detection and/or diagnosis of SARS-CoV-2 by FDA under an Emergency Use Authorization (EUA). This EUA will remain  in effect (meaning this test can be used) for the duration of the COVID-19 declaration under Section 564(b)(1) of the Act, 21 U.S.C.section 360bbb-3(b)(1), unless the authorization is terminated  or revoked sooner.       Influenza A by PCR NEGATIVE NEGATIVE Final   Influenza B by PCR NEGATIVE NEGATIVE Final    Comment: (NOTE) The Xpert Xpress SARS-CoV-2/FLU/RSV plus assay is intended as an aid in the diagnosis of influenza from Nasopharyngeal swab specimens and should not be used as a sole basis for treatment. Nasal washings and aspirates are unacceptable for Xpert Xpress SARS-CoV-2/FLU/RSV testing.  Fact Sheet for Patients: EntrepreneurPulse.com.au  Fact Sheet for Healthcare Providers: IncredibleEmployment.be  This test is not yet approved or cleared by the [place] FDA and has been authorized for detection and/or diagnosis of SARS-CoV-2 by FDA under an Emergency Use Authorization (EUA). This EUA  will remain in effect (meaning this test can be used) for the duration of the COVID-19 declaration under Section 564(b)(1) of the Act, 21 U.S.C. section 360bbb-3(b)(1), unless the authorization is terminated or revoked.  Performed at [facility], [address], [place], [place] [address]       Radiology Studies: No results found.  Scheduled Meds: . aspirin  81 mg Oral QPC supper  . atorvastatin  20 mg Oral Daily  . Chlorhexidine Gluconate Cloth  6 each Topical Daily  . ferrous sulfate  325 mg Oral Q breakfast  . midodrine  10 mg Oral TID WC   Continuous Infusions:   LOS: 5 days   Time spent: 25 minutes.  Patrecia Pour, MD [facility] [email or web address] 12/15/2020, 11:54 AM

## 2020-12-16 ENCOUNTER — Inpatient Hospital Stay (HOSPITAL_COMMUNITY): Payer: Medicare Other

## 2020-12-16 DIAGNOSIS — N179 Acute kidney failure, unspecified: Secondary | ICD-10-CM | POA: Diagnosis not present

## 2020-12-16 DIAGNOSIS — I5032 Chronic diastolic (congestive) heart failure: Secondary | ICD-10-CM

## 2020-12-16 DIAGNOSIS — I714 Abdominal aortic aneurysm, without rupture: Secondary | ICD-10-CM | POA: Diagnosis not present

## 2020-12-16 DIAGNOSIS — J189 Pneumonia, unspecified organism: Secondary | ICD-10-CM | POA: Diagnosis not present

## 2020-12-16 LAB — RENAL FUNCTION PANEL
Albumin: 2.3 g/dL — ABNORMAL LOW (ref 3.5–5.0)
Anion gap: 17 — ABNORMAL HIGH (ref 5–15)
BUN: 96 mg/dL — ABNORMAL HIGH (ref 8–23)
CO2: 23 mmol/L (ref 22–32)
Calcium: 8.3 mg/dL — ABNORMAL LOW (ref 8.9–10.3)
Chloride: 103 mmol/L (ref 98–111)
Creatinine, Ser: 7.15 mg/dL — ABNORMAL HIGH (ref 0.61–1.24)
GFR, Estimated: 7 mL/min — ABNORMAL LOW (ref >60)
Glucose, Bld: 97 mg/dL (ref 70–99)
Phosphorus: 6.5 mg/dL — ABNORMAL HIGH (ref 2.5–4.6)
Potassium: 3.4 mmol/L — ABNORMAL LOW (ref 3.5–5.1)
Sodium: 143 mmol/L (ref 135–145)

## 2020-12-16 LAB — ECHOCARDIOGRAM COMPLETE
AR max vel: 1.54 cm2
AV Area VTI: 1.76 cm2
AV Area mean vel: 1.68 cm2
AV Mean grad: 4.4 mmHg
AV Peak grad: 10.3 mmHg
Ao pk vel: 1.6 m/s
Height: 68 in
MV VTI: 1.69 cm2
S' Lateral: 4 cm
Weight: 2458.57 oz

## 2020-12-16 LAB — LEGIONELLA PNEUMOPHILA SEROGP 1 UR AG: L. pneumophila Serogp 1 Ur Ag: NEGATIVE

## 2020-12-16 LAB — MAGNESIUM: Magnesium: 1.5 mg/dL — ABNORMAL LOW (ref 1.7–2.4)

## 2020-12-16 MED ORDER — CALCIUM CARBONATE ANTACID 500 MG PO CHEW
1.0000 | CHEWABLE_TABLET | Freq: Three times a day (TID) | ORAL | Status: DC
  Administered 2020-12-16 – 2020-12-21 (×15): 200 mg via ORAL
  Filled 2020-12-16 (×14): qty 1

## 2020-12-16 MED ORDER — SODIUM CHLORIDE 0.9 % IV SOLN: INTRAVENOUS | Status: AC

## 2020-12-16 MED ORDER — POTASSIUM CHLORIDE CRYS ER 20 MEQ PO TBCR
40.0000 meq | EXTENDED_RELEASE_TABLET | Freq: Once | ORAL | Status: AC
  Administered 2020-12-16: 40 meq via ORAL
  Filled 2020-12-16: qty 2

## 2020-12-16 MED ORDER — MIDODRINE HCL 5 MG PO TABS
2.5000 mg | ORAL_TABLET | Freq: Three times a day (TID) | ORAL | Status: DC
  Administered 2020-12-16 – 2020-12-18 (×6): 2.5 mg via ORAL
  Filled 2020-12-16 (×6): qty 1

## 2020-12-16 MED ORDER — MAGNESIUM SULFATE 2 GM/50ML IV SOLN
2.0000 g | Freq: Once | INTRAVENOUS | Status: AC
  Administered 2020-12-16: 2 g via INTRAVENOUS
  Filled 2020-12-16: qty 50

## 2020-12-16 NOTE — Progress Notes (Signed)
Occupational Therapy Treatment Patient Details Name: Andrew [name] MRN: [id number] DOB: 11/26/1931 Today's Date: 12/16/2020    History of present illness 87 y.o. male presented 12/10/20 to the emergency department for evaluation of abnormal blood work. Acute renal failure with uremia;  CT the chest, abdomen, and pelvis is notable for pneumonia PMH- thoracic aortic aneurysm status post stent graft repair, CABG in 2010, AAA, chronic diastolic CHF, BPH, urinary retention, CKD II, and remote atrial fibrillation not anticoagulated, back surgery   OT comments  Pt making steady progress towards OT goals this session. Pt asleep in bed upon arrival, but easily able to arouse, agreeable to OT intervention. Session focus on progressing functional mobility with use of stedy. Pt able to sit<>stand to stedy with MIN A +2. Biggest deficits noted in pts static sitting balance from EOB with pt needing up to MIN A. Pt continues to present with impaired balance, impaired activity tolerance and generalized deconditioning. Pt would continue to benefit from skilled occupational therapy while admitted and after d/c to address the below listed limitations in order to improve overall functional mobility and facilitate independence with BADL participation. DC plan remains appropriate, will follow acutely per POC.     Follow Up Recommendations  CIR    Equipment Recommendations  Other (comment) (TBD)    Recommendations for Other Services      Precautions / Restrictions Precautions Precautions: Fall Restrictions Weight Bearing Restrictions: No       Mobility Bed Mobility Overal bed mobility: Needs Assistance Bed Mobility: Supine to Sit     Supine to sit: Mod assist;+2 for physical assistance;HOB elevated     General bed mobility comments: Pt required MOD A +2 for bed mobility needing assist to sequence all steps of bed mobility and physical assist needed to fully elevate trunk into sitting     Transfers Overall transfer level: Needs assistance Equipment used: Ambulation equipment used Transfers: Sit to/from [facility] Sit to Stand: Min assist;+2 physical assistance;Min guard Stand pivot transfers: Total assist (in stedy)       General transfer comment: pt able to stand to stedy with MIN A +2 with cues for hand placement and cues to shift hips anteriorly towards bar, min guard to stand from seat of stedy, MIN A +2 to lower safely into chair needing cues for hand placement and to extend hips backwards into chair    Balance Overall balance assessment: Needs assistance Sitting-balance support: Bilateral upper extremity supported;Feet supported Sitting balance-Leahy Scale: Poor Sitting balance - Comments: MIN A - min guard for static sitting balance EOB Postural control: Posterior lean Standing balance support: Bilateral upper extremity supported Standing balance-Leahy Scale: Poor Standing balance comment: reliant on BUE support                           ADL either performed or assessed with clinical judgement   ADL Overall ADL's : Needs assistance/impaired                         Toilet Transfer: Minimal assistance;+2 for physical assistance;Total assistance;Stand-pivot Toilet Transfer Details (indicate cue type and reason): total A +2 via stedy with pt abel to stand to stedy with MIN A +2         Functional mobility during ADLs: Minimal assistance;+2 for physical assistance (sit<>stand to stedy) General ADL Comments: pt with improvements in strength with pt able to stand to stedy with MIN A +  2, sitting balance deficits noted from EOB     Vision Baseline Vision/History: Wears glasses Wears Glasses: At all times Patient Visual Report: No change from baseline     Perception     Praxis      Cognition Arousal/Alertness: Awake/alert;Lethargic (initially lethargic but arouses more as session progresses) Behavior During  Therapy: WFL for tasks assessed/performed Overall Cognitive Status: Impaired/Different from baseline Area of Impairment: Following commands;Problem solving;Memory                     Memory: Decreased short-term memory (repeating same statements) Following Commands: Follows one step commands consistently;Follows multi-step commands with increased time     Problem Solving: Slow processing;Decreased initiation;Requires verbal cues General Comments: pt perseverating on topic of "cerevae" lotion, increased time to process commands        Exercises     Shoulder Instructions       General Comments pts daughter present during session, pt on 3L [place] with SpO2 >90% during session    Pertinent Vitals/ Pain       Pain Assessment: No/denies pain  Home Living                                          Prior Functioning/Environment              Frequency  Min 2X/week        Progress Toward Goals  OT Goals(current goals can now be found in the care plan section)  Progress towards OT goals: Progressing toward goals  Acute Rehab OT Goals Patient Stated Goal: to scratch his back OT Goal Formulation: With patient Time For Goal Achievement: 12/28/20 Potential to Achieve Goals: Good  Plan Discharge plan remains appropriate;Frequency remains appropriate    Co-evaluation                 AM-PAC OT "6 Clicks" Daily Activity     Outcome Measure   Help from another person eating meals?: A Little Help from another person taking care of personal grooming?: A Little Help from another person toileting, which includes using toliet, bedpan, or urinal?: A Lot Help from another person bathing (including washing, rinsing, drying)?: A Lot Help from another person to put on and taking off regular upper body clothing?: A Lot Help from another person to put on and taking off regular lower body clothing?: Total 6 Click Score: 13    End of Session Equipment  Utilized During Treatment: Gait belt;Other (comment);Oxygen (stedy; 3L [place])  OT Visit Diagnosis: Unsteadiness on feet (R26.81);Other abnormalities of gait and mobility (R26.89);Muscle weakness (generalized) (M62.81);History of falling (Z91.81)   Activity Tolerance Patient tolerated treatment well   Patient Left in chair;with call bell/phone within reach;with chair alarm set;with family/visitor present   Nurse Communication Mobility status;Other (comment) (+2 back to bed with stedy)        Time: 5409-8119 OT Time Calculation (min): 19 min  Charges: OT General Charges $OT Visit: 1 Visit OT Treatments $Self Care/Home Management : 8-22 mins  Harley Alto., COTA/L Acute Rehabilitation Services [phone]    Precious Haws 12/16/2020, 12:35 PM

## 2020-12-16 NOTE — Progress Notes (Signed)
PROGRESS NOTE  HAYTHAM MAHER  OZY:[id number] DOB: 08/01/1931 DOA: 12/10/2020 PCP: [name], MD  Outpatient Specialists: Vascular surgery, Dr. Carlis Abbott Brief Narrative: TENZIN EDELMAN is an 87 y.o.malewith a history of thoracic aortic aneurysm status post stent graft repair, AAA, CABG in 2010, chronic HFpEF, BPH with urinary retention, CKDII,and remote atrial fibrillation not anticoagulated who presented to the ED 5/20 prompted by PCP for abnormal blood work which was drawn in the setting of cough that hadn't improved with augmentin. He also had been losing appetite and feeling unwell.  Labs were notable for mild leukocytosis, worsening anemia, renal failure. CT the chest, abdomen, and pelvis is notable for new left upper lobe infiltrate, unchanged stent graft repair of thoracic aortic aneurysm, and grossly stable infrarenal AAA. Patient was treated with rocephin, azithromycin, albuterol, and IV fluids in the ED. SBP nadir was in 70's shortly after admission. Creatinine was 8.71 worsening to 9.32 from previous of 1.09 in Aug 2021. Nephrology was consulted, confirmed patient's desire to forgo dialysis even if life-saving, and continues intensive medical management with only very limited improvement. Palliative care following along as well.   Assessment & Plan: Principal Problem:   AKI (acute kidney injury) ([place]) Active Problems:   Thoracic aortic aneurysm without rupture ([place])   Coronary artery disease   Urinary retention   AAA (abdominal aortic aneurysm) without rupture (H[facility])   Essential hypertension   Normocytic anemia   Chronic diastolic CHF (congestive heart failure) ([place])   Community acquired pneumonia  Acute renal failure superimposed on CKD II; uremia; metabolic acidosis: Initial BUN 161, Cr 8.71 (44 &1.09 in Aug 2021) with serum bicarb of 17. Kidneys appear normal with no obstruction on CT in ED. Relatively bland micro and proteinuria.  -Appreciate nephrology assistance.  BUN continues downward trend with no overtly uremic symptoms, though Cr stubborn. Starting time-limited IV fluids today.  - Dietitian consulted to inform renal diet.  - Monitoring I/O, daily renal function panel.  - Palliative care consulted, patient wishes are clear at this time to avoid HD but otherwise pursue all treatments with curative intent and rehabilitation at the highest level offered.  - Avoiding hypotension with midodrine.  - Avoiding nephrotoxins.    Hypokalemia:  - Repeat supplementation today, continue monitoring  Hypomagnesemia:  - Continue monitoring. Supplement again today.   Hyperphosphatemia:  - Renal diet, calcium carbonate w/meals. Improving.  LUL CAP: Has completed 5 days oc ceftriaxone, azithromycin.   Hypotension: Improving. MAPs still consistently elevated. - Decreased midodrine, will decrease midodrine further starting this PM.  CAD: History of PCI and CABG 2010. No anginal complaints currently.  -Continue ASA, statin  Chronic HFpEF: - Has received alternating IVF and lasix. Will get echocardiogram to assist.  - Monitoring I/O, daily weights (ordered, still not charted).   Thoracic aortic aneurysm s/p repair:   AAA: Appears stable on CT in ED -Keep VVS appointment with repeat U/S on 6/21 with Dr. Carlis Abbott.   Iron deficiency anemia: No acute bleeding noted. s/p aranesp per nephrology. - Monitor intermittently.  - Daily iron supplement.  DVT prophylaxis: Heparin [place] Code Status: Full Family Communication: Daughter at bedside this AM Disposition Plan:  Status is: Inpatient  Remains inpatient appropriate because:Persistent severe electrolyte disturbances   Dispo: The patient is from: Home              Anticipated d/c is to: CIR              Patient currently is not medically stable  to d/c.   Difficult to place patient No  Consultants:   Nephrology  Palliative care  CIR  Procedures:   None  Antimicrobials:  Ceftriaxone,  azithromycin   Subjective: Sitting in chair, feels ok. No pain. No abdominal pain or nausea or vomiting.   Objective: Vitals:   12/15/20 2050 12/16/20 0030 12/16/20 0625 12/16/20 1200  BP: 130/88 129/85 131/86 137/83  Pulse: 76 72 75 74  Resp: 12 15 15 12   Temp: 98.8 F (37.1 C) 98.6 F (37 C) 98.4 F (36.9 C) 98 F (36.7 C)  TempSrc: Oral Oral Oral Oral  SpO2: 95%   96%  Weight:      Height:        Intake/Output Summary (Last 24 hours) at 12/16/2020 1328 Last data filed at 12/16/2020 0600 Gross per 24 hour  Intake --  Output 1650 ml  Net -1650 ml   Filed Weights   12/10/20 1551 12/11/20 0500  Weight: 65.3 kg 69.7 kg   Gen: Frail elderly male in no distress Pulm: Nonlabored breathing room air. Very mild crackles in lung bases, stable to improved from yesterday. CV: Regular rate and rhythm. No murmur, rub, or gallop. No JVD, stable mild dependent edema. GI: Abdomen soft, non-tender, non-distended, with normoactive bowel sounds.  Ext: Warm, no deformities Skin: No new rashes, lesions or ulcers on visualized skin. Neuro: Alert and oriented, bradyphrenic and HOH but without focal neurological deficits. Psych: Judgement and insight appear fair. Mood euthymic & affect congruent. Behavior is appropriate.    Data Reviewed: I have personally reviewed following labs and imaging studies  CBC: Recent Labs  Lab 12/10/20 1713 12/10/20 2323 12/12/20 0051 12/13/20 0429 12/14/20 0043 12/15/20 0303  WBC 11.8* 9.9 10.0 9.2 10.4 10.1  NEUTROABS 10.0*  --   --   --   --   --   HGB 9.4* 8.1* 7.4* 8.3* 8.0* 8.1*  HCT 28.4* 23.9* 22.1* 25.5* 24.1* 24.6*  MCV 90.2 90.2 90.2 92.4 92.3 92.8  PLT 169 169 168 190 200 297   Basic Metabolic Panel: Recent Labs  Lab 12/11/20 1209 12/12/20 0051 12/13/20 0429 12/13/20 0444 12/14/20 0043 12/15/20 0303 12/16/20 0320  NA 141 139 142  --  143 141 143  K 3.2* 3.0* 3.2*  --  3.5 3.4* 3.4*  CL 104 101 107  --  106 104 103  CO2 22 21* 21*   --  23 22 23   GLUCOSE 143* 117* 111*  --  114* 102* 97  BUN 138* 128* 113*  --  108* 100* 96*  CREATININE 8.28* 7.81* 7.72*  --  7.52* 7.08* 7.15*  CALCIUM 7.1* 6.6* 7.0*  --  7.4* 8.0* 8.3*  MG  --   --   --  1.4* 1.7  --  1.5*  PHOS 8.2*  --   --   --   --   --  6.5*   GFR: Estimated Creatinine Clearance: 7.2 mL/min (A) (by C-G formula based on SCr of 7.15 mg/dL (H)). Liver Function Tests: Recent Labs  Lab 12/10/20 1713 12/11/20 1209 12/16/20 0320  AST 12*  --   --   ALT 9  --   --   ALKPHOS 66  --   --   BILITOT 0.3  --   --   PROT 7.3  --   --   ALBUMIN 3.5 2.3* 2.3*   No results for input(s): LIPASE, AMYLASE in the last 168 hours. No results for input(s): AMMONIA in the  last 168 hours. Coagulation Profile: No results for input(s): INR, PROTIME in the last 168 hours. Cardiac Enzymes: Recent Labs  Lab 12/10/20 1852  CKTOTAL 66   BNP (last 3 results) Recent Labs    01/09/20 1443  PROBNP 1,018*   HbA1C: No results for input(s): HGBA1C in the last 72 hours. CBG: No results for input(s): GLUCAP in the last 168 hours. Lipid Profile: No results for input(s): CHOL, HDL, LDLCALC, TRIG, CHOLHDL, LDLDIRECT in the last 72 hours. Thyroid Function Tests: No results for input(s): TSH, T4TOTAL, FREET4, T3FREE, THYROIDAB in the last 72 hours. Anemia Panel: No results for input(s): VITAMINB12, FOLATE, FERRITIN, TIBC, IRON, RETICCTPCT in the last 72 hours. Urine analysis:    Component Value Date/Time   COLORURINE STRAW (A) 12/10/2020 1713   APPEARANCEUR CLEAR 12/10/2020 1713   LABSPEC 1.015 12/10/2020 1713   PHURINE 5.5 12/10/2020 1713   GLUCOSEU NEGATIVE 12/10/2020 1713   HGBUR MODERATE (A) 12/10/2020 1713   BILIRUBINUR NEGATIVE 12/10/2020 1713   KETONESUR NEGATIVE 12/10/2020 1713   PROTEINUR 30 (A) 12/10/2020 1713   UROBILINOGEN 0.2 06/09/2009 0918   NITRITE NEGATIVE 12/10/2020 1713   LEUKOCYTESUR NEGATIVE 12/10/2020 1713   Recent Results (from the past 240  hour(s))  Resp Panel by RT-PCR (Flu A&B, Covid) Nasopharyngeal Swab     Status: None   Collection Time: 12/10/20  5:13 PM   Specimen: Nasopharyngeal Swab; Nasopharyngeal(NP) swabs in vial transport medium  Result Value Ref Range Status   SARS Coronavirus 2 by RT PCR NEGATIVE NEGATIVE Final    Comment: (NOTE) SARS-CoV-2 target nucleic acids are NOT DETECTED.  The SARS-CoV-2 RNA is generally detectable in upper respiratory specimens during the acute phase of infection. The lowest concentration of SARS-CoV-2 viral copies this assay can detect is 138 copies/mL. A negative result does not preclude SARS-Cov-2 infection and should not be used as the sole basis for treatment or other patient management decisions. A negative result may occur with  improper specimen collection/handling, submission of specimen other than nasopharyngeal swab, presence of viral mutation(s) within the areas targeted by this assay, and inadequate number of viral copies(<138 copies/mL). A negative result must be combined with clinical observations, patient history, and epidemiological information. The expected result is Negative.  Fact Sheet for Patients:  EntrepreneurPulse.com.au  Fact Sheet for Healthcare Providers:  IncredibleEmployment.be  This test is no t yet approved or cleared by the [place] FDA and  has been authorized for detection and/or diagnosis of SARS-CoV-2 by FDA under an Emergency Use Authorization (EUA). This EUA will remain  in effect (meaning this test can be used) for the duration of the COVID-19 declaration under Section 564(b)(1) of the Act, 21 U.S.C.section 360bbb-3(b)(1), unless the authorization is terminated  or revoked sooner.       Influenza A by PCR NEGATIVE NEGATIVE Final   Influenza B by PCR NEGATIVE NEGATIVE Final    Comment: (NOTE) The Xpert Xpress SARS-CoV-2/FLU/RSV plus assay is intended as an aid in the diagnosis of influenza from  Nasopharyngeal swab specimens and should not be used as a sole basis for treatment. Nasal washings and aspirates are unacceptable for Xpert Xpress SARS-CoV-2/FLU/RSV testing.  Fact Sheet for Patients: EntrepreneurPulse.com.au  Fact Sheet for Healthcare Providers: IncredibleEmployment.be  This test is not yet approved or cleared by the [place] FDA and has been authorized for detection and/or diagnosis of SARS-CoV-2 by FDA under an Emergency Use Authorization (EUA). This EUA will remain in effect (meaning this test can be used) for the  duration of the COVID-19 declaration under Section 564(b)(1) of the Act, 21 U.S.C. section 360bbb-3(b)(1), unless the authorization is terminated or revoked.  Performed at [facility], [address] [place], [place] [address]       Radiology Studies: No results found.  Scheduled Meds: . aspirin  81 mg Oral QPC supper  . atorvastatin  20 mg Oral Daily  . calcium carbonate  1 tablet Oral TID WC  . Chlorhexidine Gluconate Cloth  6 each Topical Daily  . ferrous sulfate  325 mg Oral Q breakfast  . heparin injection (subcutaneous)  5,000 Units Subcutaneous Q8H  . midodrine  5 mg Oral TID WC   Continuous Infusions: . sodium chloride 75 mL/hr at 12/16/20 1003     LOS: 6 days   Time spent: 25 minutes.  Patrecia Pour, MD [facility] [email or web address] 12/16/2020, 1:28 PM

## 2020-12-16 NOTE — Plan of Care (Signed)

## 2020-12-16 NOTE — Progress Notes (Signed)
Physical Therapy Treatment Patient Details Name: Andrew [name] MRN: [id number] DOB: 01/23/1932 Today's Date: 12/16/2020    History of Present Illness 87 y.o. male presented 12/10/20 to the emergency department for evaluation of abnormal blood work. Acute renal failure with uremia;  CT the chest, abdomen, and pelvis is notable for pneumonia PMH- thoracic aortic aneurysm status post stent graft repair, CABG in 2010, AAA, chronic diastolic CHF, BPH, urinary retention, CKD II, and remote atrial fibrillation not anticoagulated, back surgery    PT Comments    Pt making steady progress with mobility and able to amb short distance in room. Continue to recommend CIR for further therapy. Pt recognized by me in that [facility] and perform strengthening ex's on multiple weight machines with wife supervising him get off and on machines. If pt's medical status improves feel his mobility can continue to improve.   Follow Up Recommendations  CIR     Equipment Recommendations  Wheelchair (measurements PT);Wheelchair cushion (measurements PT);Hospital bed (if discharging home without rehab)    Recommendations for Other Services       Precautions / Restrictions Precautions Precautions: Fall Restrictions Weight Bearing Restrictions: No    Mobility  Bed Mobility Overal bed mobility: Needs Assistance Bed Mobility: Sit to Sidelying     Supine to sit: Mod assist;+2 for physical assistance;HOB elevated   Sit to sidelying: +2 for physical assistance;Mod assist General bed mobility comments: Assist to lower trunk and bring legs up into bed    Transfers Overall transfer level: Needs assistance Equipment used: Rolling walker (2 wheeled) Transfers: Sit to/from Stand Sit to Stand: Mod assist;+2 physical assistance Stand pivot transfers: Total assist (in stedy)       General transfer comment:  Assist to bring hips up and for balance from low recliner. Incr time and verbal/tactile cues to extend hips/knees/trunk. Stood x 3 from recliner with walker. Used Stedy for chair to bed.  Ambulation/Gait Ambulation/Gait assistance: Min assist;+2 safety/equipment Gait Distance (Feet): 6 Feet (3' x 1, 6' x 1) Assistive device: Rolling walker (2 wheeled) Gait Pattern/deviations: Step-to pattern;Decreased step length - right;Decreased step length - left;Shuffle;Trunk flexed Gait velocity: decr Gait velocity interpretation: <1.31 ft/sec, indicative of household ambulator General Gait Details: Assist for balance and support. Verbal cues to look up and stand more erect.   Stairs             Wheelchair Mobility    Modified Rankin (Stroke Patients Only)       Balance Overall balance assessment: Needs assistance Sitting-balance support: Bilateral upper extremity supported;Feet supported Sitting balance-Leahy Scale: Poor Sitting balance - Comments: min guard Postural control: Posterior lean Standing balance support: Bilateral upper extremity supported Standing balance-Leahy Scale: Poor Standing balance comment: walker and min to mod assist for static standing                            Cognition Arousal/Alertness: Awake/alert Behavior During Therapy: WFL for tasks assessed/performed Overall Cognitive Status: Impaired/Different from baseline Area of Impairment: Following commands;Problem solving                     Memory: Decreased short-term memory (repeating same statements) Following Commands: Follows one step commands with increased time;Follows one step commands consistently;Follows multi-step commands with increased time     Problem Solving: Slow processing;Decreased initiation;Requires  verbal cues General Comments: pt perseverating on topic of "cerevae" lotion, increased time to process commands      Exercises      General Comments General  comments (skin integrity, edema, etc.): Pt's daughter and wife present      Pertinent Vitals/Pain Pain Assessment: No/denies pain    Home Living                      Prior Function            PT Goals (current goals can now be found in the care plan section) Acute Rehab PT Goals Patient Stated Goal: to get strong enough to go home Progress towards PT goals: Progressing toward goals    Frequency    Min 3X/week      PT Plan Current plan remains appropriate    Co-evaluation              AM-PAC PT "6 Clicks" Mobility   Outcome Measure  Help needed turning from your back to your side while in a flat bed without using bedrails?: A Lot Help needed moving from lying on your back to sitting on the side of a flat bed without using bedrails?: A Lot Help needed moving to and from a bed to a chair (including a wheelchair)?: Total Help needed standing up from a chair using your arms (e.g., wheelchair or bedside chair)?: Total Help needed to walk in hospital room?: Total Help needed climbing 3-5 steps with a railing? : Total 6 Click Score: 8    End of Session Equipment Utilized During Treatment: Gait belt;Oxygen Activity Tolerance: Patient limited by fatigue Patient left: with call bell/phone within reach;with family/visitor present;in bed;with bed alarm set   PT Visit Diagnosis: Muscle weakness (generalized) (M62.81);Difficulty in walking, not elsewhere classified (R26.2)     Time: 1093-2355 PT Time Calculation (min) (ACUTE ONLY): 33 min  Charges:  $Gait Training: 8-22 mins $Therapeutic Activity: 8-22 mins                     [place] Pager [phone] Office [place] 12/16/2020, 4:13 PM

## 2020-12-16 NOTE — Progress Notes (Signed)
Nephrology Follow-Up note   Assessment/Recommendations: Andrew [name] is a/an 87 y.o. 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 fib, admitted for AKI     Nonoliguric AKI improving: Severe AKI possibly in part dehydration and ischemic ATN.  Imaging negative for obstruction but possibly played some role as well given known of of urinary retention.  Baseline Cr 1-1.2 from 02/2020 - He has indicated that he would not want to pursue dialysis - Renal failure has been slow to improve more recently; continue supportive care - renal panel in AM - on midodrine to optimize blood pressure - NS at 75 ml/hr x 12 hours.  Note on lasix daily at home  - continue foley catheter    Hypotension: Continue midodrine.  Further work-up per primary  Hypokalemia:  Potassium 40 meq PO once now ; earlier repleted calcium; replete mag  Metabolic acidosis:  Improved; defer bicarbonate  Anemia secondary to iron deficiency as well as likely component of renal failure. Added oral iron daily.  aranesp 40 mcg once on 5/24.  CBC in AM.   Hyperphosphatemia - improved.  On renal diet. tums with meals for now.   Disposition - continue inpatient monitoring for AKI   ________________________________________________________  Interval History/Subjective: He had 1.7 L UOP over 5/25 charted.  Got lasix again on 5/25.  He has been suctioning.  Has been on 3 liters oxygen   Review of systems:      Reports breathing is ok  Denies n/v Denies chest pain Not on home oxygen  Medications:  Current Facility-Administered Medications  Medication Dose Route Frequency Provider Last Rate Last Admin  . acetaminophen (TYLENOL) tablet 650 mg  650 mg Oral Q6H PRN Opyd, Ilene Qua, MD   650 mg at 12/10/20 2340   Or  . acetaminophen (TYLENOL) suppository 650 mg  650 mg Rectal Q6H PRN Opyd, Ilene Qua, MD      .  albuterol (PROVENTIL) (2.5 MG/3ML) 0.083% nebulizer solution 2.5 mg  2.5 mg Nebulization Q2H PRN Opyd, Ilene Qua, MD      . aspirin chewable tablet 81 mg  81 mg Oral QPC supper Donne Hazel, MD   81 mg at 12/15/20 1742  . atorvastatin (LIPITOR) tablet 20 mg  20 mg Oral Daily Opyd, Ilene Qua, MD   20 mg at 12/15/20 0910  . Chlorhexidine Gluconate Cloth 2 % PADS 6 each  6 each Topical Daily Donne Hazel, MD   6 each at 12/15/20 [phone]  . ferrous sulfate tablet 325 mg  325 mg Oral Q breakfast Claudia Desanctis, MD   325 mg at 12/15/20 0911  . heparin injection 5,000 Units  5,000 Units Subcutaneous Q8H Patrecia Pour, MD   5,000 Units at 12/16/20 [phone]  . midodrine (PROAMATINE) tablet 5 mg  5 mg Oral TID WC Patrecia Pour, MD   5 mg at 12/15/20 1742  . ondansetron (ZOFRAN) tablet 4 mg  4 mg Oral Q6H PRN Opyd, Ilene Qua, MD       Or  . ondansetron (ZOFRAN) injection 4 mg  4 mg Intravenous Q6H PRN Vianne Bulls, MD          Physical Exam: Vitals:   12/16/20 0030 12/16/20 0625  BP: 129/85 131/86  Pulse: 72 75  Resp: 15 15  Temp: 98.6 F (37 C) 98.4 F (36.9 C)  SpO2:     No intake/output data  recorded.  Intake/Output Summary (Last 24 hours) at 12/16/2020 0732 Last data filed at 12/16/2020 0600 Gross per 24 hour  Intake 1160 ml  Output 1650 ml  Net -490 ml   General elderly male in bed in no acute distress   HEENT normocephalic atraumatic extraocular movements intact sclera anicteric Neck supple trachea midline Lungs basilar crackles unlabored at rest; on 3 liters oxygen Heart S1S2 no rub Abdomen soft nontender nondistended Extremities trace edema   Psych normal mood and affect Neuro - alert and oriented x 3 provides hx and follows commands GU - has a foley    Test Results I personally reviewed new and old clinical labs and radiology tests Lab Results  Component Value Date   NA 143 12/16/2020   K 3.4 (L) 12/16/2020   CL 103 12/16/2020   CO2 23 12/16/2020   BUN 96 (H)  12/16/2020   CREATININE 7.15 (H) 12/16/2020   CALCIUM 8.3 (L) 12/16/2020   ALBUMIN 2.3 (L) 12/16/2020   PHOS 6.5 (H) 12/16/2020    Claudia Desanctis, MD 12/16/2020 7:47 AM

## 2020-12-16 NOTE — Progress Notes (Signed)
IP rehab admissions - Continuing to await call back from insurance carrier regarding potential CIR admission.  Will update all once I hear back from insurance case manager.  Call for questions.  [phone]

## 2020-12-16 NOTE — Progress Notes (Signed)
  Echocardiogram 2D Echocardiogram has been performed.  Andrew [name] 12/16/2020, 3:57 PM

## 2020-12-17 ENCOUNTER — Inpatient Hospital Stay (HOSPITAL_COMMUNITY): Payer: Medicare Other

## 2020-12-17 DIAGNOSIS — N179 Acute kidney failure, unspecified: Secondary | ICD-10-CM | POA: Diagnosis not present

## 2020-12-17 DIAGNOSIS — J189 Pneumonia, unspecified organism: Secondary | ICD-10-CM | POA: Diagnosis not present

## 2020-12-17 DIAGNOSIS — I714 Abdominal aortic aneurysm, without rupture: Secondary | ICD-10-CM | POA: Diagnosis not present

## 2020-12-17 DIAGNOSIS — I5032 Chronic diastolic (congestive) heart failure: Secondary | ICD-10-CM | POA: Diagnosis not present

## 2020-12-17 LAB — RENAL FUNCTION PANEL
Albumin: 2.4 g/dL — ABNORMAL LOW (ref 3.5–5.0)
Anion gap: 14 (ref 5–15)
BUN: 95 mg/dL — ABNORMAL HIGH (ref 8–23)
CO2: 23 mmol/L (ref 22–32)
Calcium: 8.3 mg/dL — ABNORMAL LOW (ref 8.9–10.3)
Chloride: 103 mmol/L (ref 98–111)
Creatinine, Ser: 7.09 mg/dL — ABNORMAL HIGH (ref 0.61–1.24)
GFR, Estimated: 7 mL/min — ABNORMAL LOW (ref >60)
Glucose, Bld: 101 mg/dL — ABNORMAL HIGH (ref 70–99)
Phosphorus: 5.9 mg/dL — ABNORMAL HIGH (ref 2.5–4.6)
Potassium: 3.5 mmol/L (ref 3.5–5.1)
Sodium: 140 mmol/L (ref 135–145)

## 2020-12-17 LAB — CBC
HCT: 27.4 % — ABNORMAL LOW (ref 39.0–52.0)
Hemoglobin: 8.8 g/dL — ABNORMAL LOW (ref 13.0–17.0)
MCH: 30.1 pg (ref 26.0–34.0)
MCHC: 32.1 g/dL (ref 30.0–36.0)
MCV: 93.8 fL (ref 80.0–100.0)
Platelets: 266 10*3/uL (ref 150–400)
RBC: 2.92 MIL/uL — ABNORMAL LOW (ref 4.22–5.81)
RDW: 13.5 % (ref 11.5–15.5)
WBC: 8.4 10*3/uL (ref 4.0–10.5)
nRBC: 0.2 % (ref 0.0–0.2)

## 2020-12-17 MED ORDER — POTASSIUM CHLORIDE CRYS ER 20 MEQ PO TBCR
20.0000 meq | EXTENDED_RELEASE_TABLET | Freq: Once | ORAL | Status: AC
  Administered 2020-12-17: 20 meq via ORAL
  Filled 2020-12-17: qty 1

## 2020-12-17 MED ORDER — BOOST / RESOURCE BREEZE PO LIQD CUSTOM
1.0000 | Freq: Three times a day (TID) | ORAL | Status: DC
  Administered 2020-12-18 – 2021-01-10 (×55): 1 via ORAL
  Filled 2020-12-17 (×9): qty 1

## 2020-12-17 MED ORDER — FUROSEMIDE 80 MG PO TABS
80.0000 mg | ORAL_TABLET | Freq: Every day | ORAL | Status: DC
  Administered 2020-12-17 – 2020-12-18 (×2): 80 mg via ORAL
  Filled 2020-12-17 (×2): qty 1

## 2020-12-17 MED ORDER — PROSOURCE PLUS PO LIQD
30.0000 mL | Freq: Two times a day (BID) | ORAL | Status: DC
  Administered 2020-12-18 – 2020-12-29 (×24): 30 mL via ORAL
  Filled 2020-12-17 (×23): qty 30

## 2020-12-17 NOTE — TOC Progression Note (Signed)
Transition of Care [facility]) - Progression Note    Patient Details  Name: Andrew [name] MRN: [id number] Date of Birth: 03/07/1932  Transition of Care [facility]) CM/SW [place], [place] Phone Number: 12/17/2020, 3:04 PM  Clinical Narrative:     CSW met with pt, pt spouse, and daughter Andrew [name] at bedside. Confirmed with them plan for SNF. They are requesting [facility]. Spouse states that [facility] not live at [facility] that they have been on waiting list for ILF/ALF and were wondering if that made a difference. CSW explained he would follow up with both facilities to inquire about availability. CSW also provided a list of facilities that did offer a bed for pt. TOC to continue to follow for SNF placement. Pt not currently medically stable.   CSW contacted [facility]; they are still in process of being certified by state after renovations. They expect at the earliest beds may be available on Wednesday next week but that is not confirmed.  CSW called and left message with Zack Seal at [facility].   Expected Discharge Plan: [place] Barriers to Discharge: Insurance Authorization,Continued Medical Work up  Expected Discharge Plan and Services Expected Discharge Plan: [place] Choice: [place] arrangements for the past 2 months: Single Family Home                                       Social Determinants of Health (SDOH) Interventions    Readmission Risk Interventions No flowsheet data found.

## 2020-12-17 NOTE — Plan of Care (Signed)
  Problem: Nutrition: Goal: Adequate nutrition will be maintained Outcome: Not Progressing   

## 2020-12-17 NOTE — Progress Notes (Signed)
Inpatient Rehab Admissions Coordinator  I do not have a bed or insurance auth for CIR admission. Peer to Peer with Pt.'s insurance scheduled for today. Will follow for potential admit pending insurance auth and bed availability.   Clemens Catholic, [place], [place] Admissions Coordinator  [phone] ([place]) [phone] (office)

## 2020-12-17 NOTE — Progress Notes (Signed)
   Palliative Medicine Inpatient Follow Up Note   I was able to secure chat with Dr. Bonner Puna this morning. At this point in time both Ezekial and his families goals are well defined. We will sign off though if additional needs arise the PMT is happy to get re-involved. ______________________________________________________________________________________ [place] Team Team Cell Phone: [phone] Please utilize secure chat with additional questions, if there is no response within 30 minutes please call the above phone number  Palliative Medicine Team providers are available by phone from 7am to 7pm daily and can be reached through the team cell phone.  Should this patient require assistance outside of these hours, please call the patient's attending physician.

## 2020-12-17 NOTE — Progress Notes (Addendum)
PROGRESS NOTE  Andrew [name]  KDX:[id number] DOB: 05/12/1932 DOA: 12/10/2020 PCP: [name], MD  Outpatient Specialists: Vascular surgery, Dr. Carlis Abbott Brief Narrative: Andrew [name] is an 87 y.o.malewith a history of thoracic aortic aneurysm status post stent graft repair, AAA, CABG in 2010, chronic HFpEF, BPH with urinary retention, CKDII,and remote atrial fibrillation not anticoagulated who presented to the ED 5/20 prompted by PCP for abnormal blood work which was drawn in the setting of cough that hadn't improved with augmentin. He also had been losing appetite and feeling unwell.  Labs were notable for mild leukocytosis, worsening anemia, renal failure. CT the chest, abdomen, and pelvis is notable for new left upper lobe infiltrate, unchanged stent graft repair of thoracic aortic aneurysm, and grossly stable infrarenal AAA. Patient was treated with rocephin, azithromycin, albuterol, and IV fluids in the ED. SBP nadir was in 70's shortly after admission. Creatinine was 8.71 worsening to 9.32 from previous of 1.09 in Aug 2021. Nephrology was consulted, confirmed patient's desire to forgo dialysis even if life-saving, and continues intensive medical management with only very limited improvement. Palliative care following along as well.   Assessment & Plan: Principal Problem:   AKI (acute kidney injury) ([place]) Active Problems:   Thoracic aortic aneurysm without rupture ([place])   Coronary artery disease   Urinary retention   AAA (abdominal aortic aneurysm) without rupture ([facility])   Essential hypertension   Normocytic anemia   Chronic diastolic CHF (congestive heart failure) ([place])   Community acquired pneumonia  Acute renal failure superimposed on CKD II; uremia; metabolic acidosis: Initial BUN 161, Cr 8.71 (44 &1.09 in Aug 2021) with serum bicarb of 17. Kidneys appear normal with no obstruction on CT in ED. Relatively bland micro and proteinuria.  -Appreciate nephrology assistance.  BUN continues downward trend with no appreciable improvement in CrCl to date. Restarting lasix today with continue hypoxia and rales. Check CXR.  - Dietitian consulted to inform renal diet. Discussed with patient/family that his poor appetite suggests that he is at greater risk of malnutrition at this time than from hyperphos/hyperK, etc. from dietary sources. They would like to discuss with RD.  - Monitoring I/O, daily renal function panel.  - Palliative care consulted, patient wishes are clear at this time to avoid HD but otherwise pursue all treatments with curative intent and rehabilitation at the highest level offered.  - Avoiding hypotension with midodrine.  - Avoiding nephrotoxins.    Hypokalemia:  - Continue monitoring  Hypomagnesemia:  - Continue monitoring.   Hyperphosphatemia:  - Renal diet, calcium carbonate w/meals. Improving.  Acute hypoxic respiratory failure: Due to CAP, possible aspiration, possible renal failure-related volume overload.  - Continue supplemental oxygen to maintain SpO2 >90% with normal WOB.  - Lasix today - CXR ordered.   LUL CAP: Has completed 5 days of ceftriaxone, azithromycin.   Hypotension: Improving. MAPs still consistently elevated. - Continue midodrine. Need to avoid hypotension as this may have caused ATN insult initially  Urinary retention: Chronic, BPH.  - Continue foley with ongoing assessment of renal failure.  CAD: History of PCI and CABG 2010. No anginal complaints currently.  -Continue ASA, statin  Chronic HFpEF: Echo 5/26 unchanged from prior with LVEF 60-65%, unevaluated diastolic function, no RWMAs. Normal RV, no severe valvular disease.  - Restarted on home lasix today by nephrology.  - Monitoring I/O, daily weights  Thoracic aortic aneurysm s/p repair: Stable  AAA: Appears stable on CT in ED -Keep VVS appointment with repeat U/S on 6/21  with Dr. Carlis Abbott.   Iron deficiency anemia: No acute bleeding noted. s/p aranesp  per nephrology. - Monitor intermittently.  - Daily iron supplement.  DVT prophylaxis: Heparin [place] Code Status: Full Family Communication: Daughter and wife at bedside this AM Disposition Plan:  Status is: Inpatient  Remains inpatient appropriate because:Persistent severe electrolyte disturbances   Dispo: The patient is from: Home              Anticipated d/c is to: TBD. Will pursue SNF options at this time as insurance has denied authorization for CIR after peer-to-peer by myself 5/27.                Patient currently is not medically stable to d/c.   Difficult to place patient No  Consultants:   Nephrology  Palliative care  CIR  Procedures:   None  Antimicrobials:  Ceftriaxone, azithromycin   Subjective: Having very poor appetite, feels generally lousy. Amenable to PT and sat in chair for >3 hours yesterday. Having some abdominal pain he reports to family but essentially denies to me. He says he's worried about the aneurysm. Trying to have BM but unable (LBM yesterday)  Objective: Vitals:   12/16/20 1955 12/17/20 0018 12/17/20 0900 12/17/20 1239  BP: 118/81 (!) 136/91 124/81 124/63  Pulse: 75 83  77  Resp: 20 18 19 14   Temp: 98.5 F (36.9 C) 98.2 F (36.8 C) 98.3 F (36.8 C)   TempSrc: Oral Oral  Oral  SpO2: 95% 97% 92% 92%  Weight:      Height:        Intake/Output Summary (Last 24 hours) at 12/17/2020 1321 Last data filed at 12/16/2020 1930 Gross per 24 hour  Intake 340.43 ml  Output 650 ml  Net -309.57 ml   Filed Weights   12/10/20 1551 12/11/20 0500  Weight: 65.3 kg 69.7 kg   Gen: Frail, elderly male appearing tired. Pulm: Nonlabored on 5L O2 without wheezes or crackles. CV: Regular rate and rhythm with PVCs. No murmur, rub, or gallop. No JVD, no dependent edema. GI: Abdomen soft, non-tender, non-distended, with normoactive bowel sounds. Pulsatile aorta stable. Ext: Warm, no deformities Skin: No new rashes, lesions or ulcers on visualized  skin. Neuro: Alert and oriented. No focal neurological deficits. Psych: Judgement and insight appear fair. Mood euthymic & affect congruent. Behavior is appropriate.    Data Reviewed: I have personally reviewed following labs and imaging studies  CBC: Recent Labs  Lab 12/10/20 1713 12/10/20 2323 12/12/20 0051 12/13/20 0429 12/14/20 0043 12/15/20 0303 12/17/20 0220  WBC 11.8*   < > 10.0 9.2 10.4 10.1 8.4  NEUTROABS 10.0*  --   --   --   --   --   --   HGB 9.4*   < > 7.4* 8.3* 8.0* 8.1* 8.8*  HCT 28.4*   < > 22.1* 25.5* 24.1* 24.6* 27.4*  MCV 90.2   < > 90.2 92.4 92.3 92.8 93.8  PLT 169   < > 168 190 200 215 266   < > = values in this interval not displayed.   Basic Metabolic Panel: Recent Labs  Lab 12/11/20 1209 12/12/20 0051 12/13/20 0429 12/13/20 0444 12/14/20 0043 12/15/20 0303 12/16/20 0320 12/17/20 0220  NA 141   < > 142  --  143 141 143 140  K 3.2*   < > 3.2*  --  3.5 3.4* 3.4* 3.5  CL 104   < > 107  --  106 104 103 103  CO2 22   < > 21*  --  23 22 23 23   GLUCOSE 143*   < > 111*  --  114* 102* 97 101*  BUN 138*   < > 113*  --  108* 100* 96* 95*  CREATININE 8.28*   < > 7.72*  --  7.52* 7.08* 7.15* 7.09*  CALCIUM 7.1*   < > 7.0*  --  7.4* 8.0* 8.3* 8.3*  MG  --   --   --  1.4* 1.7  --  1.5*  --   PHOS 8.2*  --   --   --   --   --  6.5* 5.9*   < > = values in this interval not displayed.   GFR: Estimated Creatinine Clearance: 7.2 mL/min (A) (by C-G formula based on SCr of 7.09 mg/dL (H)). Liver Function Tests: Recent Labs  Lab 12/10/20 1713 12/11/20 1209 12/16/20 0320 12/17/20 0220  AST 12*  --   --   --   ALT 9  --   --   --   ALKPHOS 66  --   --   --   BILITOT 0.3  --   --   --   PROT 7.3  --   --   --   ALBUMIN 3.5 2.3* 2.3* 2.4*   Cardiac Enzymes: Recent Labs  Lab 12/10/20 1852  CKTOTAL 66   BNP (last 3 results) Recent Labs    01/09/20 1443  PROBNP 1,018*   Urine analysis:    Component Value Date/Time   COLORURINE STRAW (A)  12/10/2020 1713   APPEARANCEUR CLEAR 12/10/2020 1713   LABSPEC 1.015 12/10/2020 1713   PHURINE 5.5 12/10/2020 1713   GLUCOSEU NEGATIVE 12/10/2020 1713   HGBUR MODERATE (A) 12/10/2020 1713   BILIRUBINUR NEGATIVE 12/10/2020 1713   KETONESUR NEGATIVE 12/10/2020 1713   PROTEINUR 30 (A) 12/10/2020 1713   UROBILINOGEN 0.2 06/09/2009 0918   NITRITE NEGATIVE 12/10/2020 1713   LEUKOCYTESUR NEGATIVE 12/10/2020 1713   Recent Results (from the past 240 hour(s))  Resp Panel by RT-PCR (Flu A&B, Covid) Nasopharyngeal Swab     Status: None   Collection Time: 12/10/20  5:13 PM   Specimen: Nasopharyngeal Swab; Nasopharyngeal(NP) swabs in vial transport medium  Result Value Ref Range Status   SARS Coronavirus 2 by RT PCR NEGATIVE NEGATIVE Final    Comment: (NOTE) SARS-CoV-2 target nucleic acids are NOT DETECTED.  The SARS-CoV-2 RNA is generally detectable in upper respiratory specimens during the acute phase of infection. The lowest concentration of SARS-CoV-2 viral copies this assay can detect is 138 copies/mL. A negative result does not preclude SARS-Cov-2 infection and should not be used as the sole basis for treatment or other patient management decisions. A negative result may occur with  improper specimen collection/handling, submission of specimen other than nasopharyngeal swab, presence of viral mutation(s) within the areas targeted by this assay, and inadequate number of viral copies(<138 copies/mL). A negative result must be combined with clinical observations, patient history, and epidemiological information. The expected result is Negative.  Fact Sheet for Patients:  EntrepreneurPulse.com.au  Fact Sheet for Healthcare Providers:  IncredibleEmployment.be  This test is no t yet approved or cleared by the [place] FDA and  has been authorized for detection and/or diagnosis of SARS-CoV-2 by FDA under an Emergency Use Authorization (EUA). This  EUA will remain  in effect (meaning this test can be used) for the duration of the COVID-19 declaration under Section 564(b)(1) of the Act, 21 U.S.C.section  360bbb-3(b)(1), unless the authorization is terminated  or revoked sooner.       Influenza A by PCR NEGATIVE NEGATIVE Final   Influenza B by PCR NEGATIVE NEGATIVE Final    Comment: (NOTE) The Xpert Xpress SARS-CoV-2/FLU/RSV plus assay is intended as an aid in the diagnosis of influenza from Nasopharyngeal swab specimens and should not be used as a sole basis for treatment. Nasal washings and aspirates are unacceptable for Xpert Xpress SARS-CoV-2/FLU/RSV testing.  Fact Sheet for Patients: EntrepreneurPulse.com.au  Fact Sheet for Healthcare Providers: IncredibleEmployment.be  This test is not yet approved or cleared by the [place] FDA and has been authorized for detection and/or diagnosis of SARS-CoV-2 by FDA under an Emergency Use Authorization (EUA). This EUA will remain in effect (meaning this test can be used) for the duration of the COVID-19 declaration under Section 564(b)(1) of the Act, 21 U.S.C. section 360bbb-3(b)(1), unless the authorization is terminated or revoked.  Performed at [facility], [address], [place], [place] [address]       Radiology Studies: ECHOCARDIOGRAM COMPLETE  Result Date: 12/16/2020    ECHOCARDIOGRAM REPORT   Patient Name:   ASHWIN TIBBS Date of Exam: 12/16/2020 Medical Rec #:  [id number]       Height:       68.0 in Accession #:    [id number]      Weight:       153.7 lb Date of Birth:  10/07/1931       BSA:          1.827 m Patient Age:    46 years        BP:           137/83 mmHg Patient Gender: M               HR:           68 bpm. Exam Location:  Inpatient Procedure: 2D Echo Indications:    congestive heart failure  History:        Patient has prior history of Echocardiogram examinations, most                 recent  01/15/2020. CAD, Abnormal ECG, aortic aneurysm.                 pneumnonia.; Risk Factors:Hypertension.  Sonographer:    Johny Chess Referring Phys: [id number] Patrecia Pour  Sonographer Comments: Image acquisition challenging due to respiratory motion and Image acquisition challenging due to uncooperative patient. IMPRESSIONS  1. Left ventricular ejection fraction, by estimation, is 60 to 65%. The left ventricle has normal function. The left ventricle has no regional wall motion abnormalities. Left ventricular diastolic function could not be evaluated.  2. Right ventricular systolic function is normal. The right ventricular size is normal. There is normal pulmonary artery systolic pressure. The estimated right ventricular systolic pressure is 09.6 mmHg.  3. Left atrial size was moderately dilated.  4. The mitral valve is degenerative. Trivial mitral valve regurgitation. Mild mitral stenosis. The mean mitral valve gradient is 3.3 mmHg. Moderate mitral annular calcification.  5. The aortic valve is calcified. There is moderate calcification of the aortic valve. There is moderate thickening of the aortic valve. Aortic valve regurgitation is not visualized. Mild to moderate aortic valve sclerosis/calcification is present, without any evidence of aortic stenosis. Comparison(s): No significant change from prior study. FINDINGS  Left Ventricle: Left ventricular ejection fraction, by estimation, is 60 to 65%. The left ventricle has normal function.  The left ventricle has no regional wall motion abnormalities. The left ventricular internal cavity size was normal in size. There is  no left ventricular hypertrophy. Left ventricular diastolic function could not be evaluated due to mitral annular calcification (moderate or greater). Left ventricular diastolic function could not be evaluated. Right Ventricle: The right ventricular size is normal. No increase in right ventricular wall thickness. Right ventricular systolic function  is normal. There is normal pulmonary artery systolic pressure. The tricuspid regurgitant velocity is 2.77 m/s, and  with an assumed right atrial pressure of 3 mmHg, the estimated right ventricular systolic pressure is 42.7 mmHg. Left Atrium: Left atrial size was moderately dilated. Right Atrium: Right atrial size was normal in size. Pericardium: There is no evidence of pericardial effusion. Mitral Valve: The mitral valve is degenerative in appearance. Moderate mitral annular calcification. Trivial mitral valve regurgitation. Mild mitral valve stenosis. The mean mitral valve gradient is 3.3 mmHg. Tricuspid Valve: The tricuspid valve is grossly normal. Tricuspid valve regurgitation is mild . No evidence of tricuspid stenosis. Aortic Valve: The aortic valve is calcified. There is moderate calcification of the aortic valve. There is moderate thickening of the aortic valve. Aortic valve regurgitation is not visualized. Mild to moderate aortic valve sclerosis/calcification is present, without any evidence of aortic stenosis. Aortic valve mean gradient measures 4.4 mmHg. Aortic valve peak gradient measures 10.3 mmHg. Aortic valve area, by VTI measures 1.76 cm. Pulmonic Valve: The pulmonic valve was grossly normal. Pulmonic valve regurgitation is not visualized. No evidence of pulmonic stenosis. Aorta: The aortic root and ascending aorta are structurally normal, with no evidence of dilitation. Venous: The inferior vena cava was not well visualized. IAS/Shunts: The atrial septum is grossly normal.  LEFT VENTRICLE PLAX 2D LVIDd:         4.60 cm LVIDs:         4.00 cm LV PW:         0.90 cm LV IVS:        1.00 cm LVOT diam:     1.80 cm LV SV:         55 LV SV Index:   30 LVOT Area:     2.54 cm  LEFT ATRIUM             Index       RIGHT ATRIUM           Index LA diam:        3.70 cm 2.02 cm/m  RA Area:     16.50 cm LA Vol (A2C):   49.6 ml 27.15 ml/m RA Volume:   35.50 ml  19.43 ml/m LA Vol (A4C):   85.0 ml 46.52 ml/m LA  Biplane Vol: 70.2 ml 38.42 ml/m  AORTIC VALVE AV Area (Vmax):    1.54 cm AV Area (Vmean):   1.68 cm AV Area (VTI):     1.76 cm AV Vmax:           160.17 cm/s AV Vmean:          98.149 cm/s AV VTI:            0.311 m AV Peak Grad:      10.3 mmHg AV Mean Grad:      4.4 mmHg LVOT Vmax:         96.96 cm/s LVOT Vmean:        64.748 cm/s LVOT VTI:          0.215 m LVOT/AV VTI ratio: 0.69  AORTA  Ao Root diam: 3.60 cm Ao Asc diam:  2.90 cm MITRAL VALVE           TRICUSPID VALVE MV Area VTI:  1.69 cm TR Peak grad:   30.7 mmHg MV Mean grad: 3.3 mmHg TR Vmax:        277.00 cm/s MV VTI:       0.32 m                        SHUNTS                        Systemic VTI:  0.22 m                        Systemic Diam: 1.80 cm Eleonore Chiquito MD Electronically signed by Eleonore Chiquito MD Signature Date/Time: 12/16/2020/4:25:14 PM    Final     Scheduled Meds: . aspirin  81 mg Oral QPC supper  . atorvastatin  20 mg Oral Daily  . calcium carbonate  1 tablet Oral TID WC  . Chlorhexidine Gluconate Cloth  6 each Topical Daily  . ferrous sulfate  325 mg Oral Q breakfast  . furosemide  80 mg Oral Daily  . heparin injection (subcutaneous)  5,000 Units Subcutaneous Q8H  . midodrine  2.5 mg Oral TID WC   Continuous Infusions:    LOS: 7 days   Time spent: 35 minutes.  Patrecia Pour, MD [facility] [email or web address] 12/17/2020, 1:21 PM

## 2020-12-17 NOTE — Progress Notes (Signed)
Initial Nutrition Assessment  DOCUMENTATION CODES:  Not applicable  INTERVENTION:   Recommend liberalizing pt's diet to regular to optimize po intake  Recommend use of phosphorus binder given hyperphosphatemia   Boost Breeze po TID, each supplement provides 250 kcal and 9 grams of protein  86ml Prosource Plus po BID, each supplement provides 100 kcals and 15 grams of protein  NUTRITION DIAGNOSIS:  Inadequate oral intake related to poor appetite as evidenced by per patient/family report.  GOAL:  Patient will meet greater than or equal to 90% of their needs  MONITOR:  PO intake,Supplement acceptance,Labs,Weight trends,I & O's  REASON FOR ASSESSMENT:  Consult Diet education  ASSESSMENT:  Pt admitted with ARF superimposed on CKD II; uremia; metabolic acidosis. PMH includes thoracic aortic aneurysm s/p stent graft repair, CAD s/p CABG (2010), AAA, HTN, CHF, BPH with urinary retention requiring intemittent self-catheterization, Afib.  Pt reports having a poor appetite for at least 1 week PTA, but denies N/V/D/abdominal pain. Since admit, pt's intake has been inadequate to meet needs. Meal completions charted as 50-75% x5 recorded meals (55% average meal intake). Will provide pt with supplements to provide additional kcals/protein.   Per MD, pt's kidneys appear normal with no obstruction on CT. Nephrology on board and suspects AKI is possibly due to dehydration and ischemic ATN. Pt's BUN consistently trending downward with no overtly uremic symptoms, though Cr not improving. MD started "time-limited" IVF yesterday. Nephrology noted that pt has already indicated he does not want to pursue dialysis if needed. MD consulted RD to discuss renal diet with pt; however, pt would not benefit from a renal diet at this time as PO intake is inadequate to meet needs and pt is already endorsing a poor appetite, so providing pt with a diet with fewer options will likely further decrease pt's po intake.  Additionally, the renal diet is meant to limit additional intake of electrolytes and only phosphorus has been elevated in pt's blood work; potassium and magnesium often below normal limits. Given pt's poor intake, diet is likely not contributing to pt's hyperphosphatemia. Recommend diet liberalization to optimize po intake and also recommend initiation of phosphorus binder to help lower phosphorus levels. Discussed with MD.   Note pt was supposed to go to CIR, but insurance initially denied authorization. Peer to peer is scheduled for today. Regardless, CIR reports no bed available today.   Reviewed weight history; no significant weight changes noted.   UOP: 662ml x24 hours  Medications: tums, ferrous sulfate, lasix Labs: Recent Labs  Lab 12/11/20 1209 12/12/20 0051 12/13/20 0444 12/14/20 0043 12/15/20 0303 12/16/20 0320 12/17/20 0220  NA 141   < >  --  143 141 143 140  K 3.2*   < >  --  3.5 3.4* 3.4* 3.5  CL 104   < >  --  106 104 103 103  CO2 22   < >  --  23 22 23 23   BUN 138*   < >  --  108* 100* 96* 95*  CREATININE 8.28*   < >  --  7.52* 7.08* 7.15* 7.09*  CALCIUM 7.1*   < >  --  7.4* 8.0* 8.3* 8.3*  MG  --   --  1.4* 1.7  --  1.5*  --   PHOS 8.2*  --   --   --   --  6.5* 5.9*  GLUCOSE 143*   < >  --  114* 102* 97 101*   < > = values in  this interval not displayed.   Diet Order:   Diet Order            Diet renal with fluid restriction Fluid restriction: Other (see comments); Room service appropriate? Yes; Fluid consistency: Thin  Diet effective now                EDUCATION NEEDS:  No education needs have been identified at this time  Skin:  Skin Assessment: Reviewed RN Assessment  Last BM:  5/24  Height:  Ht Readings from Last 1 Encounters:  12/10/20 5\' 8"  (1.727 m)   Weight:  Wt Readings from Last 1 Encounters:  12/11/20 69.7 kg   BMI:  Body mass index is 23.36 kg/m.  Estimated Nutritional Needs:  Kcal:  5859-2924 Protein:  85-95g Fluid:   >1.75L/d    Andrew Ina, MS, RD, LDN RD pager number and weekend/on-call pager number located in [place].

## 2020-12-17 NOTE — Progress Notes (Signed)
Called for peer-to-peer with patient's insurance for CIR authorization. After holding, was told MD assigned to case, Dr. Debby Bud, is unavailable. Left my cell phone number, will await call back.  Vance Gather, MD 12/17/2020 11:55 AM

## 2020-12-17 NOTE — Progress Notes (Signed)
Nephrology Follow-Up note   Assessment/Recommendations: Andrew [name] is a/an 87 y.o. 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 fib, admitted for AKI     Nonoliguric AKI improving: Severe AKI possibly in part dehydration and ischemic ATN.  Imaging negative for obstruction but possibly played some role as well given known of of urinary retention.  Baseline Cr 1-1.2 from 02/2020 - He has indicated that he would not want to pursue dialysis - Renal failure has been slow to improve more recently; continue supportive care - renal panel in AM - on midodrine to optimize blood pressure - resume home lasix at 80 mg daily - continue foley catheter    Hypotension: Continue midodrine.  Further work-up per primary  Hypokalemia:  Potassium 20 meq PO once now ; earlier repleted calcium; mag in am  Metabolic acidosis:  Improved; defer bicarbonate  Anemia secondary to iron deficiency as well as likely component of renal failure. Added oral iron daily.  aranesp 40 mcg once on 5/24.    Hyperphosphatemia - improved.  On renal diet. tums with meals for now.   Disposition - continue inpatient monitoring for AKI.  Note he has been recommended for CIR  ________________________________________________________  Interval History/Subjective: He had 650 mL UOP over 5/26 charted.  Got ns at 75/hr x 12 hours yesterday.  He has been recommended for CIR.   Review of systems:     Reports breathing is ok  Denies n/v Denies chest pain Has foley - in/out caths at home  Medications:  Current Facility-Administered Medications  Medication Dose Route Frequency Provider Last Rate Last Admin  . acetaminophen (TYLENOL) tablet 650 mg  650 mg Oral Q6H PRN Opyd, Ilene Qua, MD   650 mg at 12/10/20 2340   Or  . acetaminophen (TYLENOL) suppository 650 mg  650 mg Rectal Q6H PRN Opyd, Ilene Qua, MD      .  albuterol (PROVENTIL) (2.5 MG/3ML) 0.083% nebulizer solution 2.5 mg  2.5 mg Nebulization Q2H PRN Opyd, Ilene Qua, MD      . aspirin chewable tablet 81 mg  81 mg Oral QPC supper Donne Hazel, MD   81 mg at 12/16/20 1713  . atorvastatin (LIPITOR) tablet 20 mg  20 mg Oral Daily Opyd, Ilene Qua, MD   20 mg at 12/16/20 0951  . calcium carbonate (TUMS - dosed in mg elemental calcium) chewable tablet 200 mg of elemental calcium  1 tablet Oral TID WC Claudia Desanctis, MD   200 mg of elemental calcium at 12/16/20 1713  . Chlorhexidine Gluconate Cloth 2 % PADS 6 each  6 each Topical Daily Donne Hazel, MD   6 each at 12/16/20 1818  . ferrous sulfate tablet 325 mg  325 mg Oral Q breakfast Claudia Desanctis, MD   325 mg at 12/16/20 0951  . heparin injection 5,000 Units  5,000 Units Subcutaneous Q8H Patrecia Pour, MD   5,000 Units at 12/17/20 [phone]  . midodrine (PROAMATINE) tablet 2.5 mg  2.5 mg Oral TID WC Patrecia Pour, MD   2.5 mg at 12/16/20 1713  . ondansetron (ZOFRAN) tablet 4 mg  4 mg Oral Q6H PRN Opyd, Ilene Qua, MD       Or  . ondansetron (ZOFRAN) injection 4 mg  4 mg Intravenous Q6H PRN Opyd, Ilene Qua, MD          Physical Exam: Vitals:  12/16/20 1955 12/17/20 0018  BP: 118/81 (!) 136/91  Pulse: 75 83  Resp: 20 18  Temp: 98.5 F (36.9 C) 98.2 F (36.8 C)  SpO2: 95% 97%   No intake/output data recorded.  Intake/Output Summary (Last 24 hours) at 12/17/2020 5364 Last data filed at 12/16/2020 1930 Gross per 24 hour  Intake 340.43 ml  Output 650 ml  Net -309.57 ml   General elderly male in bed in no acute distress    HEENT normocephalic atraumatic extraocular movements intact sclera anicteric Neck supple trachea midline Lungs basilar crackles unlabored at rest; on 5 liters oxygen Heart S1S2 no rub Abdomen soft nontender nondistended Extremities no edema   Psych normal mood and affect Neuro - alert and oriented x 3 provides hx and follows commands GU - has a foley    Test  Results I personally reviewed new and old clinical labs and radiology tests Lab Results  Component Value Date   NA 140 12/17/2020   K 3.5 12/17/2020   CL 103 12/17/2020   CO2 23 12/17/2020   BUN 95 (H) 12/17/2020   CREATININE 7.09 (H) 12/17/2020   CALCIUM 8.3 (L) 12/17/2020   ALBUMIN 2.4 (L) 12/17/2020   PHOS 5.9 (H) 12/17/2020    Claudia Desanctis, MD 12/17/2020 7:01 AM

## 2020-12-17 NOTE — Progress Notes (Signed)
Inpatient Rehab Admissions Coordinator:    Insurance denied request for CIR following peer-to-peer. I notified family and they state interest in SNF for rehab. I have relayed that information to [facility] team.  Clemens Catholic, [place], [place] Admissions Coordinator  [phone] (celll) [phone] (office)

## 2020-12-18 DIAGNOSIS — N179 Acute kidney failure, unspecified: Secondary | ICD-10-CM | POA: Diagnosis not present

## 2020-12-18 DIAGNOSIS — I714 Abdominal aortic aneurysm, without rupture: Secondary | ICD-10-CM | POA: Diagnosis not present

## 2020-12-18 DIAGNOSIS — I5032 Chronic diastolic (congestive) heart failure: Secondary | ICD-10-CM | POA: Diagnosis not present

## 2020-12-18 DIAGNOSIS — J189 Pneumonia, unspecified organism: Secondary | ICD-10-CM | POA: Diagnosis not present

## 2020-12-18 LAB — RENAL FUNCTION PANEL
Albumin: 2.6 g/dL — ABNORMAL LOW (ref 3.5–5.0)
Anion gap: 17 — ABNORMAL HIGH (ref 5–15)
BUN: 96 mg/dL — ABNORMAL HIGH (ref 8–23)
CO2: 21 mmol/L — ABNORMAL LOW (ref 22–32)
Calcium: 9 mg/dL (ref 8.9–10.3)
Chloride: 99 mmol/L (ref 98–111)
Creatinine, Ser: 7.47 mg/dL — ABNORMAL HIGH (ref 0.61–1.24)
GFR, Estimated: 7 mL/min — ABNORMAL LOW (ref >60)
Glucose, Bld: 133 mg/dL — ABNORMAL HIGH (ref 70–99)
Phosphorus: 6 mg/dL — ABNORMAL HIGH (ref 2.5–4.6)
Potassium: 3.9 mmol/L (ref 3.5–5.1)
Sodium: 137 mmol/L (ref 135–145)

## 2020-12-18 LAB — MAGNESIUM: Magnesium: 2 mg/dL (ref 1.7–2.4)

## 2020-12-18 NOTE — Progress Notes (Addendum)
PROGRESS NOTE  SUKHRAJ ESQUIVIAS  GEZ:[id number] DOB: 04/05/1932 DOA: 12/10/2020 PCP: [name], MD  Outpatient Specialists: Vascular surgery, Dr. Carlis Abbott Brief Narrative: JONNATHAN BIRMAN is an 87 y.o.malewith a history of thoracic aortic aneurysm status post stent graft repair, AAA, CABG in 2010, chronic HFpEF, BPH with urinary retention, CKDII,and remote atrial fibrillation not anticoagulated who presented to the ED 5/20 prompted by PCP for abnormal blood work which was drawn in the setting of cough that hadn't improved with augmentin. He also had been losing appetite and feeling unwell.  Labs were notable for mild leukocytosis, worsening anemia, renal failure. CT the chest, abdomen, and pelvis is notable for new left upper lobe infiltrate, unchanged stent graft repair of thoracic aortic aneurysm, and grossly stable infrarenal AAA. Patient was treated with rocephin, azithromycin, albuterol, and IV fluids in the ED. SBP nadir was in 70's shortly after admission. Creatinine was 8.71 worsening to 9.32 from previous of 1.09 in Aug 2021. Nephrology was consulted, confirmed patient's desire to forgo dialysis even if life-saving, and continues intensive medical management with only very limited improvement. Palliative care following along as well.   On 5/27 patient developed lower abdominal discomfort, decreased UOP from foley which was found to to be draining despite flushes. This was replaced with 1300cc urine return.  Assessment & Plan: Principal Problem:   AKI (acute kidney injury) ([place]) Active Problems:   Thoracic aortic aneurysm without rupture ([place])   Coronary artery disease   Urinary retention   AAA (abdominal aortic aneurysm) without rupture ([facility])   Essential hypertension   Normocytic anemia   Chronic diastolic CHF (congestive heart failure) ([place])   Community acquired pneumonia  Acute renal failure superimposed on CKD II; uremia; metabolic acidosis: Initial BUN 161, Cr 8.71 (44  &1.09 in Aug 2021) with serum bicarb of 17. Kidneys appear normal with no obstruction on CT in ED. Relatively bland micro and proteinuria. Slight acidosis on labs 5/28, worsened on 5/28.  -Appreciate nephrology assistance.  - Suspect a continued element of postrenal uropathy with poorly draining foley which has been replaced. D/w RN to repeat bladder scan this PM to confirm no residual is found. Would also rescan and troubleshoot if pt experiences abd discomfort or output declines.  - Dietitian consulted to inform renal diet. Discussed with patient/family that his poor appetite suggests that he is at greater risk of malnutrition at this time than from hyperphos/hyperK, etc. from dietary sources. They would like to discuss with RD.  - Monitoring I/O, daily renal function panel.  - Palliative care consulted, patient wishes are clear at this time to avoid HD but otherwise pursue all treatments with curative intent and rehabilitation at the highest level offered.  - Avoiding hypotension with midodrine.  - Avoiding nephrotoxins.    Hypokalemia:  - Continue monitoring  Hypomagnesemia:  - Continue monitoring.   Hyperphosphatemia:  - Renal diet, calcium carbonate w/meals. Improving.  Acute hypoxic respiratory failure: Due to CAP, possible aspiration, possible renal failure-related volume overload.  - Continue supplemental oxygen to maintain SpO2 >90% with normal WOB. CXR 5/27 personally reviewed showing very mild interstitial prominence and bibasilar faint opacities consistent with atelectasis.   LUL CAP: Has completed 5 days of ceftriaxone, azithromycin.   Hypotension: Improving. MAPs still consistently elevated. - Continue midodrine. Need to avoid hypotension as this may have caused ATN insult initially  Urinary retention: Chronic, BPH.  - Continue foley with ongoing assessment of renal failure.  CAD: History of PCI and CABG 2010. No  anginal complaints currently.  -Continue ASA,  statin  Chronic HFpEF: Echo 5/26 unchanged from prior with LVEF 60-65%, unevaluated diastolic function, no RWMAs. Normal RV, no severe valvular disease.  - Restarted on home lasix today by nephrology.  - Monitoring I/O, daily weights  Thoracic aortic aneurysm s/p repair: Stable  AAA: Appears stable on CT in ED -Keep VVS appointment with repeat U/S on 6/21 with Dr. Carlis Abbott.   Iron deficiency anemia: No acute bleeding noted. Hgb rebounding s/p aranesp per nephrology. - Monitor intermittently.  - Daily iron supplement.  DVT prophylaxis: Heparin [place] Code Status: Full Family Communication: Daughter and wife at bedside this AM Disposition Plan:  Status is: Inpatient  Remains inpatient appropriate because:Persistent severe electrolyte disturbances   Dispo: The patient is from: Home              Anticipated d/c is to: TBD. Will pursue SNF options at this time as insurance has denied authorization for CIR after peer-to-peer by myself 5/27.                Patient currently is not medically stable to d/c.   Difficult to place patient No  Consultants:   Nephrology  Palliative care  CIR  Procedures:   None  Antimicrobials:  Ceftriaxone, azithromycin   Subjective: Ate a better breakfast and has relief of lower abdominal pain. No other complaints, but didn't rest well last night. Foley failed to drain and has been replaced with improvement, 1300cc UOP.  Objective: Vitals:   12/18/20 0000 12/18/20 0400 12/18/20 0800 12/18/20 1200  BP: (!) 154/90 116/70 97/61 98/67   Pulse: 89 75 76 77  Resp: 17 16 16 16   Temp:  98.2 F (36.8 C) 98.1 F (36.7 C) 98.5 F (36.9 C)  TempSrc:  Oral Oral Oral  SpO2: 98% 98% 99% 98%  Weight:      Height:        Intake/Output Summary (Last 24 hours) at 12/18/2020 1321 Last data filed at 12/18/2020 1610 Gross per 24 hour  Intake 240 ml  Output 1900 ml  Net -1660 ml   Filed Weights   12/10/20 1551 12/11/20 0500  Weight: 65.3 kg 69.7 kg    Gen: Tired, elderly male in no distress Pulm: Nonlabored breathing supplemental oxygen, much more clear grossly. CV: Regular rate and rhythm. No murmur, rub, or gallop. No JVD, trace pitting dependent edema. GI: Abdomen soft, non-tender, non-distended, with normoactive bowel sounds. Wide hernia and pulsatile mass stable. No palpable bladder. Ext: Warm, no deformities Skin: No new rashes, lesions or ulcers on visualized skin. Neuro: Drowsy but rousable, moved all extremities. Psych: Judgement and insight appear fair. Mood euthymic & affect congruent. Behavior is appropriate.    Data Reviewed: I have personally reviewed following labs and imaging studies  CBC: Recent Labs  Lab 12/12/20 0051 12/13/20 0429 12/14/20 0043 12/15/20 0303 12/17/20 0220  WBC 10.0 9.2 10.4 10.1 8.4  HGB 7.4* 8.3* 8.0* 8.1* 8.8*  HCT 22.1* 25.5* 24.1* 24.6* 27.4*  MCV 90.2 92.4 92.3 92.8 93.8  PLT 168 190 200 215 960   Basic Metabolic Panel: Recent Labs  Lab 12/13/20 0444 12/14/20 0043 12/15/20 0303 12/16/20 0320 12/17/20 0220 12/18/20 0112  NA  --  143 141 143 140 137  K  --  3.5 3.4* 3.4* 3.5 3.9  CL  --  106 104 103 103 99  CO2  --  23 22 23 23  21*  GLUCOSE  --  114* 102* 97 101* 133*  BUN  --  108* 100* 96* 95* 96*  CREATININE  --  7.52* 7.08* 7.15* 7.09* 7.47*  CALCIUM  --  7.4* 8.0* 8.3* 8.3* 9.0  MG 1.4* 1.7  --  1.5*  --  2.0  PHOS  --   --   --  6.5* 5.9* 6.0*   GFR: Estimated Creatinine Clearance: 6.9 mL/min (A) (by C-G formula based on SCr of 7.47 mg/dL (H)). Liver Function Tests: Recent Labs  Lab 12/16/20 0320 12/17/20 0220 12/18/20 0112  ALBUMIN 2.3* 2.4* 2.6*   Cardiac Enzymes: No results for input(s): CKTOTAL, CKMB, CKMBINDEX, TROPONINI in the last 168 hours. BNP (last 3 results) Recent Labs    01/09/20 1443  PROBNP 1,018*   Urine analysis:    Component Value Date/Time   COLORURINE STRAW (A) 12/10/2020 1713   APPEARANCEUR CLEAR 12/10/2020 1713   LABSPEC 1.015  12/10/2020 1713   PHURINE 5.5 12/10/2020 1713   GLUCOSEU NEGATIVE 12/10/2020 1713   HGBUR MODERATE (A) 12/10/2020 1713   BILIRUBINUR NEGATIVE 12/10/2020 1713   KETONESUR NEGATIVE 12/10/2020 1713   PROTEINUR 30 (A) 12/10/2020 1713   UROBILINOGEN 0.2 06/09/2009 0918   NITRITE NEGATIVE 12/10/2020 1713   LEUKOCYTESUR NEGATIVE 12/10/2020 1713   Recent Results (from the past 240 hour(s))  Resp Panel by RT-PCR (Flu A&B, Covid) Nasopharyngeal Swab     Status: None   Collection Time: 12/10/20  5:13 PM   Specimen: Nasopharyngeal Swab; Nasopharyngeal(NP) swabs in vial transport medium  Result Value Ref Range Status   SARS Coronavirus 2 by RT PCR NEGATIVE NEGATIVE Final    Comment: (NOTE) SARS-CoV-2 target nucleic acids are NOT DETECTED.  The SARS-CoV-2 RNA is generally detectable in upper respiratory specimens during the acute phase of infection. The lowest concentration of SARS-CoV-2 viral copies this assay can detect is 138 copies/mL. A negative result does not preclude SARS-Cov-2 infection and should not be used as the sole basis for treatment or other patient management decisions. A negative result may occur with  improper specimen collection/handling, submission of specimen other than nasopharyngeal swab, presence of viral mutation(s) within the areas targeted by this assay, and inadequate number of viral copies(<138 copies/mL). A negative result must be combined with clinical observations, patient history, and epidemiological information. The expected result is Negative.  Fact Sheet for Patients:  EntrepreneurPulse.com.au  Fact Sheet for Healthcare Providers:  IncredibleEmployment.be  This test is no t yet approved or cleared by the [place] FDA and  has been authorized for detection and/or diagnosis of SARS-CoV-2 by FDA under an Emergency Use Authorization (EUA). This EUA will remain  in effect (meaning this test can be used) for the  duration of the COVID-19 declaration under Section 564(b)(1) of the Act, 21 U.S.C.section 360bbb-3(b)(1), unless the authorization is terminated  or revoked sooner.       Influenza A by PCR NEGATIVE NEGATIVE Final   Influenza B by PCR NEGATIVE NEGATIVE Final    Comment: (NOTE) The Xpert Xpress SARS-CoV-2/FLU/RSV plus assay is intended as an aid in the diagnosis of influenza from Nasopharyngeal swab specimens and should not be used as a sole basis for treatment. Nasal washings and aspirates are unacceptable for Xpert Xpress SARS-CoV-2/FLU/RSV testing.  Fact Sheet for Patients: EntrepreneurPulse.com.au  Fact Sheet for Healthcare Providers: IncredibleEmployment.be  This test is not yet approved or cleared by the [place] FDA and has been authorized for detection and/or diagnosis of SARS-CoV-2 by FDA under an Emergency Use Authorization (EUA). This EUA will remain in effect (meaning  this test can be used) for the duration of the COVID-19 declaration under Section 564(b)(1) of the Act, 21 U.S.C. section 360bbb-3(b)(1), unless the authorization is terminated or revoked.  Performed at [facility], [address] [place],  [address]       Radiology Studies: DG CHEST PORT 1 VIEW  Result Date: 12/17/2020 CLINICAL DATA:  History of aortic stent graft repair with persistent cough and hypoxia EXAM: PORTABLE CHEST 1 VIEW COMPARISON:  12/10/2020 FINDINGS: Cardiac shadow is enlarged but stable. Postsurgical changes are noted. Thoracic aortic stent graft is again seen in the descending thoracic aorta stable from the prior exam. Lungs are well aerated bilaterally. Mild atelectatic changes are noted in the right base as well as in the left mid lung. No sizable effusion is noted. No bony abnormality is seen. IMPRESSION: Mild bilateral atelectatic changes. Electronically Signed   By: Inez Catalina M.D.   On: 12/17/2020 15:12    ECHOCARDIOGRAM COMPLETE  Result Date: 12/16/2020    ECHOCARDIOGRAM REPORT   Patient Name:   PEARL BERLINGER Date of Exam: 12/16/2020 Medical Rec #:  [id number]       Height:       68.0 in Accession #:    [id number]      Weight:       153.7 lb Date of Birth:  11/12/1931       BSA:          1.827 m Patient Age:    36 years        BP:           137/83 mmHg Patient Gender: M               HR:           68 bpm. Exam Location:  Inpatient Procedure: 2D Echo Indications:    congestive heart failure  History:        Patient has prior history of Echocardiogram examinations, most                 recent 01/15/2020. CAD, Abnormal ECG, aortic aneurysm.                 pneumnonia.; Risk Factors:Hypertension.  Sonographer:    Johny Chess Referring Phys: [id number] Patrecia Pour  Sonographer Comments: Image acquisition challenging due to respiratory motion and Image acquisition challenging due to uncooperative patient. IMPRESSIONS  1. Left ventricular ejection fraction, by estimation, is 60 to 65%. The left ventricle has normal function. The left ventricle has no regional wall motion abnormalities. Left ventricular diastolic function could not be evaluated.  2. Right ventricular systolic function is normal. The right ventricular size is normal. There is normal pulmonary artery systolic pressure. The estimated right ventricular systolic pressure is 84.6 mmHg.  3. Left atrial size was moderately dilated.  4. The mitral valve is degenerative. Trivial mitral valve regurgitation. Mild mitral stenosis. The mean mitral valve gradient is 3.3 mmHg. Moderate mitral annular calcification.  5. The aortic valve is calcified. There is moderate calcification of the aortic valve. There is moderate thickening of the aortic valve. Aortic valve regurgitation is not visualized. Mild to moderate aortic valve sclerosis/calcification is present, without any evidence of aortic stenosis. Comparison(s): No significant change from prior study. FINDINGS   Left Ventricle: Left ventricular ejection fraction, by estimation, is 60 to 65%. The left ventricle has normal function. The left ventricle has no regional wall motion abnormalities. The left ventricular internal cavity size was normal in  size. There is  no left ventricular hypertrophy. Left ventricular diastolic function could not be evaluated due to mitral annular calcification (moderate or greater). Left ventricular diastolic function could not be evaluated. Right Ventricle: The right ventricular size is normal. No increase in right ventricular wall thickness. Right ventricular systolic function is normal. There is normal pulmonary artery systolic pressure. The tricuspid regurgitant velocity is 2.77 m/s, and  with an assumed right atrial pressure of 3 mmHg, the estimated right ventricular systolic pressure is 38.9 mmHg. Left Atrium: Left atrial size was moderately dilated. Right Atrium: Right atrial size was normal in size. Pericardium: There is no evidence of pericardial effusion. Mitral Valve: The mitral valve is degenerative in appearance. Moderate mitral annular calcification. Trivial mitral valve regurgitation. Mild mitral valve stenosis. The mean mitral valve gradient is 3.3 mmHg. Tricuspid Valve: The tricuspid valve is grossly normal. Tricuspid valve regurgitation is mild . No evidence of tricuspid stenosis. Aortic Valve: The aortic valve is calcified. There is moderate calcification of the aortic valve. There is moderate thickening of the aortic valve. Aortic valve regurgitation is not visualized. Mild to moderate aortic valve sclerosis/calcification is present, without any evidence of aortic stenosis. Aortic valve mean gradient measures 4.4 mmHg. Aortic valve peak gradient measures 10.3 mmHg. Aortic valve area, by VTI measures 1.76 cm. Pulmonic Valve: The pulmonic valve was grossly normal. Pulmonic valve regurgitation is not visualized. No evidence of pulmonic stenosis. Aorta: The aortic root and  ascending aorta are structurally normal, with no evidence of dilitation. Venous: The inferior vena cava was not well visualized. IAS/Shunts: The atrial septum is grossly normal.  LEFT VENTRICLE PLAX 2D LVIDd:         4.60 cm LVIDs:         4.00 cm LV PW:         0.90 cm LV IVS:        1.00 cm LVOT diam:     1.80 cm LV SV:         55 LV SV Index:   30 LVOT Area:     2.54 cm  LEFT ATRIUM             Index       RIGHT ATRIUM           Index LA diam:        3.70 cm 2.02 cm/m  RA Area:     16.50 cm LA Vol (A2C):   49.6 ml 27.15 ml/m RA Volume:   35.50 ml  19.43 ml/m LA Vol (A4C):   85.0 ml 46.52 ml/m LA Biplane Vol: 70.2 ml 38.42 ml/m  AORTIC VALVE AV Area (Vmax):    1.54 cm AV Area (Vmean):   1.68 cm AV Area (VTI):     1.76 cm AV Vmax:           160.17 cm/s AV Vmean:          98.149 cm/s AV VTI:            0.311 m AV Peak Grad:      10.3 mmHg AV Mean Grad:      4.4 mmHg LVOT Vmax:         96.96 cm/s LVOT Vmean:        64.748 cm/s LVOT VTI:          0.215 m LVOT/AV VTI ratio: 0.69  AORTA Ao Root diam: 3.60 cm Ao Asc diam:  2.90 cm MITRAL VALVE  TRICUSPID VALVE MV Area VTI:  1.69 cm TR Peak grad:   30.7 mmHg MV Mean grad: 3.3 mmHg TR Vmax:        277.00 cm/s MV VTI:       0.32 m                        SHUNTS                        Systemic VTI:  0.22 m                        Systemic Diam: 1.80 cm Eleonore Chiquito MD Electronically signed by Eleonore Chiquito MD Signature Date/Time: 12/16/2020/4:25:14 PM    Final     Scheduled Meds: . (feeding supplement) PROSource Plus  30 mL Oral BID BM  . aspirin  81 mg Oral QPC supper  . atorvastatin  20 mg Oral Daily  . calcium carbonate  1 tablet Oral TID WC  . Chlorhexidine Gluconate Cloth  6 each Topical Daily  . feeding supplement  1 Container Oral TID BM  . ferrous sulfate  325 mg Oral Q breakfast  . furosemide  80 mg Oral Daily  . heparin injection (subcutaneous)  5,000 Units Subcutaneous Q8H  . midodrine  2.5 mg Oral TID WC   Continuous  Infusions:    LOS: 8 days   Time spent: 35 minutes.  Patrecia Pour, MD [facility] [email or web address] 12/18/2020, 1:21 PM

## 2020-12-18 NOTE — Plan of Care (Signed)

## 2020-12-18 NOTE — Progress Notes (Signed)
This RN realized pt's foley has not been draining. Foley was manipulated and advanced but still no urine return. Foley was flushed x 3 with saline flushes but still no urine return. This RN manually withdrew urine with a syringe until 1300cc of urine filled the foley bag. Nephrology provider, Royce Macadamia, at bedside and notified.

## 2020-12-18 NOTE — Progress Notes (Signed)
Nephrology Follow-Up note   Assessment/Recommendations: Andrew [name] is a/an 87 y.o. 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 fib, admitted for AKI     Nonoliguric AKI improving: Severe AKI possibly in part dehydration and ischemic ATN.  Obstruction contributed as well given known of of urinary retention and long-term requirement for in/out caths.  Recently slow to improve and has had obstruction while here.  Home med of daily lasix and note NSAID as well.  Baseline Cr 1-1.2 from 02/2020. UA 30 mg/dL and 0-5 RBC not suggestive of GN - He has indicated that he would not want to pursue dialysis - Renal failure has been slow to improve more recently however note issue with urinary retention 1400 mL overnight despite the catheter which may explain failure to improve; appreciate RN Stanton Kidney.  Hopeful for continued improvement with catheter exchange - exchange foley - order placed - labs today are from before the obstruction was addressed - hold lasix today and will reassess tomorrow - he just had 1200-1400 mL urine output this am over half an hour after foley flushed and repeatedly suctioned - renal panel in AM  - on midodrine to optimize blood pressure - note dose reduced  Hypotension: Continue midodrine.  Further work-up per primary  Hypokalemia:  Acceptable;  earlier repleted calcium; mag in am  Metabolic acidosis:  Improved; defer bicarbonate  Anemia secondary to iron deficiency as well as likely component of renal failure. Added oral iron daily.  aranesp 40 mcg once on 5/24.    Acute hypoxic respiratory failure - has thus far guided diuresis.  Lungs clear on CXR with mild bilateral atelectatic changes.  Had CXR yesterday  Hyperphosphatemia - On renal diet. tums with meals for now.   Disposition - continue inpatient monitoring for AKI.  Note he has been recommended for  CIR but this was declined so they are seeking SNF  ________________________________________________________  Interval History/Subjective:  Patient's RN Stanton Kidney noted that he hadn't had urine output charted since beginning of shift.  She flushed and then repeatedly suctioned his foley and he had 1200 -1400 mL out over half an hour.  He had 600 mL UOP over 5/27 charted.   CIR was denied and family is interested in SNF per charting.  Review of systems:    Reports breathing is ok  Denies n/v Denies chest pain  Medications:  Current Facility-Administered Medications  Medication Dose Route Frequency Provider Last Rate Last Admin  . (feeding supplement) PROSource Plus liquid 30 mL  30 mL Oral BID BM Patrecia Pour, MD      . acetaminophen (TYLENOL) tablet 650 mg  650 mg Oral Q6H PRN Opyd, Ilene Qua, MD   650 mg at 12/18/20 0008   Or  . acetaminophen (TYLENOL) suppository 650 mg  650 mg Rectal Q6H PRN Opyd, Ilene Qua, MD      . albuterol (PROVENTIL) (2.5 MG/3ML) 0.083% nebulizer solution 2.5 mg  2.5 mg Nebulization Q2H PRN Opyd, Ilene Qua, MD      . aspirin chewable tablet 81 mg  81 mg Oral QPC supper Donne Hazel, MD   81 mg at 12/17/20 1616  . atorvastatin (LIPITOR) tablet 20 mg  20 mg Oral Daily Opyd, Ilene Qua, MD   20 mg at 12/17/20 0959  . calcium carbonate (TUMS - dosed in mg elemental calcium) chewable tablet 200 mg of elemental calcium  1 tablet Oral  TID WC Harrie Jeans C, MD   200 mg of elemental calcium at 12/17/20 1616  . Chlorhexidine Gluconate Cloth 2 % PADS 6 each  6 each Topical Daily Donne Hazel, MD   6 each at 12/17/20 1004  . feeding supplement (BOOST / RESOURCE BREEZE) liquid 1 Container  1 Container Oral TID BM Vance Gather B, MD      . ferrous sulfate tablet 325 mg  325 mg Oral Q breakfast Claudia Desanctis, MD   325 mg at 12/17/20 0959  . furosemide (LASIX) tablet 80 mg  80 mg Oral Daily Claudia Desanctis, MD   80 mg at 12/17/20 0959  . heparin injection 5,000 Units  5,000 Units  Subcutaneous Q8H Patrecia Pour, MD   5,000 Units at 12/18/20 0547  . midodrine (PROAMATINE) tablet 2.5 mg  2.5 mg Oral TID WC Patrecia Pour, MD   2.5 mg at 12/17/20 1616  . ondansetron (ZOFRAN) tablet 4 mg  4 mg Oral Q6H PRN Opyd, Ilene Qua, MD       Or  . ondansetron (ZOFRAN) injection 4 mg  4 mg Intravenous Q6H PRN Opyd, Ilene Qua, MD          Physical Exam: Vitals:   12/18/20 0000 12/18/20 0400  BP: (!) 154/90 116/70  Pulse: 89 75  Resp: 17 16  Temp:  98.2 F (36.8 C)  SpO2: 98% 98%   Total I/O In: 240 [P.O.:240] Out: 600 [Urine:600]  Intake/Output Summary (Last 24 hours) at 12/18/2020 9983 Last data filed at 12/17/2020 2034 Gross per 24 hour  Intake 240 ml  Output 600 ml  Net -360 ml   General elderly male in bed in no acute distress  HEENT normocephalic atraumatic extraocular movements intact sclera anicteric Neck supple trachea midline Lungs clear anteriorly unlabored at rest Heart S1S2 no rub Abdomen soft nontender nondistended Extremities no edema   Psych normal mood and affect Neuro - alert and oriented x 3 provides hx and follows commands GU - has a foley which is currently being suctioned 1400 mL in bag   Test Results I personally reviewed new and old clinical labs and radiology tests Lab Results  Component Value Date   NA 137 12/18/2020   K 3.9 12/18/2020   CL 99 12/18/2020   CO2 21 (L) 12/18/2020   BUN 96 (H) 12/18/2020   CREATININE 7.47 (H) 12/18/2020   CALCIUM 9.0 12/18/2020   ALBUMIN 2.6 (L) 12/18/2020   PHOS 6.0 (H) 12/18/2020    Claudia Desanctis, MD 12/18/2020 7:17 AM

## 2020-12-19 DIAGNOSIS — I5032 Chronic diastolic (congestive) heart failure: Secondary | ICD-10-CM | POA: Diagnosis not present

## 2020-12-19 DIAGNOSIS — J189 Pneumonia, unspecified organism: Secondary | ICD-10-CM | POA: Diagnosis not present

## 2020-12-19 DIAGNOSIS — N179 Acute kidney failure, unspecified: Secondary | ICD-10-CM | POA: Diagnosis not present

## 2020-12-19 DIAGNOSIS — I714 Abdominal aortic aneurysm, without rupture: Secondary | ICD-10-CM | POA: Diagnosis not present

## 2020-12-19 LAB — RENAL FUNCTION PANEL
Albumin: 2.2 g/dL — ABNORMAL LOW (ref 3.5–5.0)
Anion gap: 16 — ABNORMAL HIGH (ref 5–15)
BUN: 111 mg/dL — ABNORMAL HIGH (ref 8–23)
CO2: 22 mmol/L (ref 22–32)
Calcium: 8.3 mg/dL — ABNORMAL LOW (ref 8.9–10.3)
Chloride: 99 mmol/L (ref 98–111)
Creatinine, Ser: 7.94 mg/dL — ABNORMAL HIGH (ref 0.61–1.24)
GFR, Estimated: 6 mL/min — ABNORMAL LOW (ref >60)
Glucose, Bld: 98 mg/dL (ref 70–99)
Phosphorus: 6.9 mg/dL — ABNORMAL HIGH (ref 2.5–4.6)
Potassium: 3.6 mmol/L (ref 3.5–5.1)
Sodium: 137 mmol/L (ref 135–145)

## 2020-12-19 LAB — GLUCOSE, CAPILLARY: Glucose-Capillary: 162 mg/dL — ABNORMAL HIGH (ref 70–99)

## 2020-12-19 MED ORDER — POTASSIUM CHLORIDE CRYS ER 20 MEQ PO TBCR
20.0000 meq | EXTENDED_RELEASE_TABLET | Freq: Once | ORAL | Status: AC
  Administered 2020-12-19: 20 meq via ORAL
  Filled 2020-12-19: qty 1

## 2020-12-19 MED ORDER — SODIUM CHLORIDE 0.9 % IV SOLN: INTRAVENOUS | Status: AC

## 2020-12-19 MED ORDER — MIDODRINE HCL 5 MG PO TABS
5.0000 mg | ORAL_TABLET | Freq: Three times a day (TID) | ORAL | Status: DC
  Administered 2020-12-19 – 2020-12-21 (×6): 5 mg via ORAL
  Filled 2020-12-19 (×6): qty 1

## 2020-12-19 NOTE — Progress Notes (Signed)
Nephrology Follow-Up note   Assessment/Recommendations: Andrew [name] is a/an 87 y.o. 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 fib, admitted for AKI     Nonoliguric AKI improving: Severe AKI possibly in part dehydration and ischemic ATN.  Obstruction contributed as well given known of of urinary retention and long-term requirement for in/out caths.  Recently slow to improve and had retention (despite a foley) while here.  Home med of daily lasix and note NSAID as well.  Baseline Cr 1-1.2 from 02/2020. UA 30 mg/dL and 0-5 RBC not suggestive of GN.  Foley exchanged 5/28.  May have post-obstructive diuresis currently.  Also with hypotension overnight - he now states that though he would have hoped to avoid dialysis he does want dialysis if today's efforts don't improve renal function - fluids today - NS at 75/hr x 24 hours - confirmed home med lasix is on hold - he did get yesterday's dose - renal panel in AM  - on midodrine to optimize blood pressure - increase dose back to 5 mg TID  - NPO after midnight tonight for now for possible procedure 5/30  Hypotension: Continue midodrine - increase dose.  Further work-up per primary.  Gentle fluids as above  Hypokalemia: potassium 20 meq once now;  earlier repleted calcium; mag in am  Metabolic acidosis:  Improved; defer bicarbonate  Anemia secondary to iron deficiency as well as likely component of renal failure. Added oral iron daily.  aranesp 40 mcg once on 5/24.  CBC in am  Acute hypoxic respiratory failure - has thus far guided diuresis.  Lungs clear on CXR with mild bilateral atelectatic changes.  Note hx chronic HFpEF  Hyperphosphatemia - On renal diet. tums with meals for now.   Disposition - continue inpatient monitoring for AKI.  Note he has been recommended for CIR but this was declined so they are seeking  SNF  ________________________________________________________  Interval History/Subjective:   He had 3.2 liters UOP over 5/28.   Foley not draining initially as noted 5/28 then was replaced.  Has felt better.  He has been on 1.5 liters of oxygen.  Labs worse today.  Did get lasix yesterday and hypotension overnight.  I called his wife on speakerphone and we all discussed his labs and updates.  Before this admission he was getting around with a walker and going to a gym three days a week.  The patient and his wife both independently stated that though he would've hoped to avoid dialysis that he is willing to do it if he has to in order to stay alive.  We discussed that despite efforts to this point renal function still significantly impaired and we were at that point should fluids today not improve his labs.    Review of systems:     Denies shortness of breath  Denies n/v Denies chest pain  Medications:  Current Facility-Administered Medications  Medication Dose Route Frequency Provider Last Rate Last Admin  . (feeding supplement) PROSource Plus liquid 30 mL  30 mL Oral BID BM Patrecia Pour, MD   30 mL at 12/18/20 1304  . acetaminophen (TYLENOL) tablet 650 mg  650 mg Oral Q6H PRN Opyd, Ilene Qua, MD   650 mg at 12/18/20 2153   Or  . acetaminophen (TYLENOL) suppository 650 mg  650 mg Rectal Q6H PRN Opyd, Ilene Qua, MD      . albuterol (PROVENTIL) (2.5  MG/3ML) 0.083% nebulizer solution 2.5 mg  2.5 mg Nebulization Q2H PRN Opyd, Ilene Qua, MD      . aspirin chewable tablet 81 mg  81 mg Oral QPC supper Donne Hazel, MD   81 mg at 12/18/20 1718  . atorvastatin (LIPITOR) tablet 20 mg  20 mg Oral Daily Opyd, Ilene Qua, MD   20 mg at 12/18/20 7829  . calcium carbonate (TUMS - dosed in mg elemental calcium) chewable tablet 200 mg of elemental calcium  1 tablet Oral TID WC Claudia Desanctis, MD   200 mg of elemental calcium at 12/18/20 1718  . Chlorhexidine Gluconate Cloth 2 % PADS 6 each  6 each Topical  Daily Donne Hazel, MD   6 each at 12/17/20 1004  . feeding supplement (BOOST / RESOURCE BREEZE) liquid 1 Container  1 Container Oral TID BM Patrecia Pour, MD   1 Container at 12/18/20 2153  . ferrous sulfate tablet 325 mg  325 mg Oral Q breakfast Claudia Desanctis, MD   325 mg at 12/18/20 5621  . furosemide (LASIX) tablet 80 mg  80 mg Oral Daily Claudia Desanctis, MD   80 mg at 12/18/20 0929  . heparin injection 5,000 Units  5,000 Units Subcutaneous Q8H Patrecia Pour, MD   5,000 Units at 12/19/20 0517  . midodrine (PROAMATINE) tablet 2.5 mg  2.5 mg Oral TID WC Patrecia Pour, MD   2.5 mg at 12/18/20 1718  . ondansetron (ZOFRAN) tablet 4 mg  4 mg Oral Q6H PRN Opyd, Ilene Qua, MD       Or  . ondansetron (ZOFRAN) injection 4 mg  4 mg Intravenous Q6H PRN Vianne Bulls, MD          Physical Exam: Vitals:   12/19/20 0001 12/19/20 0401  BP: (!) 92/58 (!) 90/52  Pulse: 70 75  Resp: 14 16  Temp: 98.9 F (37.2 C) 98.1 F (36.7 C)  SpO2: 97% 96%   No intake/output data recorded.  Intake/Output Summary (Last 24 hours) at 12/19/2020 0703 Last data filed at 12/19/2020 3086 Gross per 24 hour  Intake 180 ml  Output 3200 ml  Net -3020 ml   General elderly male in bed in no acute distress   HEENT normocephalic atraumatic extraocular movements intact sclera anicteric Neck supple trachea midline Lungs clear anteriorly unlabored at rest; on 1.5 liters oxygen Heart S1S2 no rub Abdomen soft nontender nondistended Extremities no edema   Psych normal mood and affect Neuro - alert and oriented x 3 provides hx and follows commands GU - has a foley in place   Test Results I personally reviewed new and old clinical labs and radiology tests Lab Results  Component Value Date   NA 137 12/19/2020   K 3.6 12/19/2020   CL 99 12/19/2020   CO2 22 12/19/2020   BUN 111 (H) 12/19/2020   CREATININE 7.94 (H) 12/19/2020   CALCIUM 8.3 (L) 12/19/2020   ALBUMIN 2.2 (L) 12/19/2020   PHOS 6.9 (H) 12/19/2020     Claudia Desanctis, MD 12/19/2020 7:29 AM

## 2020-12-19 NOTE — Plan of Care (Signed)

## 2020-12-19 NOTE — Progress Notes (Signed)
PROGRESS NOTE  Andrew AUGUSTE  [name]:[id number] DOB: 03/07/1932 DOA: 12/10/2020 PCP: [name], MD  Outpatient Specialists: Vascular surgery, Dr. Carlis Abbott Brief Narrative: Andrew [name] is an 87 y.o.malewith a history of thoracic aortic aneurysm status post stent graft repair, AAA, CABG in 2010, chronic HFpEF, BPH with urinary retention, CKDII,and remote atrial fibrillation not anticoagulated who presented to the ED 5/20 prompted by PCP for abnormal blood work which was drawn in the setting of cough that hadn't improved with augmentin. He also had been losing appetite and feeling unwell.  Labs were notable for mild leukocytosis, worsening anemia, renal failure. CT the chest, abdomen, and pelvis is notable for new left upper lobe infiltrate, unchanged stent graft repair of thoracic aortic aneurysm, and grossly stable infrarenal AAA. Patient was treated with rocephin, azithromycin, albuterol, and IV fluids in the ED. SBP nadir was in 70's shortly after admission. Creatinine was 8.71 worsening to 9.32 from previous of 1.09 in Aug 2021. Nephrology was consulted, confirmed patient's desire to forgo dialysis even if life-saving, and continues intensive medical management with only very limited improvement. Palliative care following along as well.   On 5/27 patient developed lower abdominal discomfort, decreased UOP from foley which was found not to be draining despite flushes. This was replaced with 1300cc urine return and subsequent post-obstructive diuresis and hypotension improved with midodrine. IV fluids are restarted. Further goals of care discussions have continued. The patient and family now confirm their desire for trial of hemodialysis if there continues to be no renal recovery.  Assessment & Plan: Principal Problem:   AKI (acute kidney injury) ([place]) Active Problems:   Thoracic aortic aneurysm without rupture ([place])   Coronary artery disease   Urinary retention   AAA (abdominal  aortic aneurysm) without rupture ([facility])   Essential hypertension   Normocytic anemia   Chronic diastolic CHF (congestive heart failure) ([place])   Community acquired pneumonia  Acute renal failure superimposed on CKD II; uremia; metabolic acidosis: Initial BUN 161, Cr 8.71 (44 &1.09 in Aug 2021) with serum bicarb of 17. Kidneys appear normal with no obstruction on CT in ED. Relatively bland micro and proteinuria. Slight acidosis on labs 5/28, worsened on 5/28.  -Appreciate nephrology assistance. Would offer trial of dialysis if no renal recovery. They will plan for dialysis access placement and initiation if no improvement in renal function. Note BUN rising. I spoke with Dr. Serafina Royals of IR who will evaluate for this. - Increased midodrine with hypotension, starting IV fluids today. Bladder scanned yesterday evening which was negative after pt retained despite foley previously (last exchanged 5/28).  - Monitoring I/O, daily renal function panel.  - Avoiding nephrotoxins.    Hypokalemia:  - Supplement again today. Continue monitoring  Hypomagnesemia:  - Continue monitoring.   Hyperphosphatemia:  - Renal diet, calcium carbonate w/meals. Improving.  Acute hypoxic respiratory failure: Due to CAP, possible aspiration, possible renal failure-related volume overload.  - Continue supplemental oxygen to maintain SpO2 >90% with normal WOB. CXR 5/27 personally reviewed showing very mild interstitial prominence and bibasilar faint opacities consistent with atelectasis. This seems to be stable.   LUL CAP: Has completed 5 days of ceftriaxone, azithromycin.   Hypotension: Improving. MAPs still consistently elevated. - Continue midodrine, increased dose. Need to avoid hypotension as this may have caused ATN insult initially  Urinary retention: Chronic, BPH.  - Continue foley with ongoing assessment of renal failure. Exchanged 12/18/2020.   CAD: History of PCI and CABG 2010. No anginal complaints  currently.  -  Continue ASA, statin  Chronic HFpEF: Echo 5/26 unchanged from prior with LVEF 60-65%, unevaluated diastolic function, no RWMAs. Normal RV, no severe valvular disease.  - Restarted on home lasix today by nephrology.  - Monitoring I/O, daily weights  Thoracic aortic aneurysm s/p repair: Stable  AAA: Appears stable on CT in ED -Keep VVS appointment with repeat U/S on 6/21 with Dr. Carlis Abbott.   Iron deficiency anemia: No acute bleeding noted. Hgb rebounding s/p aranesp per nephrology. - Monitor intermittently.  - Daily iron supplement.  DVT prophylaxis: Heparin [place] Code Status: Full Family Communication: Wife at bedside this AM Disposition Plan:  Status is: Inpatient  Remains inpatient appropriate because:Persistent severe electrolyte disturbances  Dispo: The patient is from: Home              Anticipated d/c is to: TBD. Will pursue SNF options at this time as insurance has denied authorization for CIR after peer-to-peer by myself 5/27.                Patient currently is not medically stable to d/c.   Difficult to place patient No  Consultants:   Nephrology  Palliative care  CIR  Procedures:   None  Antimicrobials:  Ceftriaxone, azithromycin   Subjective: Again, ate better breakfast this morning, but renal function slightly worse. Nephrology discussed with pt and family at length about trial of dialysis which they would actually desire. His premorbid functional status and no known irreversible etiology of renal failure make this a reasonable treatment plan.   Objective: Vitals:   12/19/20 0001 12/19/20 0401 12/19/20 0521 12/19/20 0800  BP: (!) 92/58 (!) 90/52  (!) 99/56  Pulse: 70 75  73  Resp: 14 16  18   Temp: 98.9 F (37.2 C) 98.1 F (36.7 C)    TempSrc: Axillary Oral    SpO2: 97% 96%  95%  Weight:   59.9 kg   Height:        Intake/Output Summary (Last 24 hours) at 12/19/2020 1049 Last data filed at 12/19/2020 0557 Gross per 24 hour  Intake  300 ml  Output 1900 ml  Net -1600 ml   Filed Weights   12/10/20 1551 12/11/20 0500 12/19/20 0521  Weight: 65.3 kg 69.7 kg 59.9 kg   Gen: 87 y.o. male in no distress Pulm: Nonlabored breathing supplemental oxygen, 99% CV: Regular rate and rhythm with PVCs, no edema noted. GI: Abdomen soft, non-tender, non-distended, with normoactive bowel sounds.  Ext: Warm, no deformities Skin: No new rashes, lesions or ulcers on visualized skin. Neuro: Drowsy, rousable. No noted focal neurological deficits. Psych: Judgement and insight appear intact.   Data Reviewed: I have personally reviewed following labs and imaging studies  CBC: Recent Labs  Lab 12/13/20 0429 12/14/20 0043 12/15/20 0303 12/17/20 0220  WBC 9.2 10.4 10.1 8.4  HGB 8.3* 8.0* 8.1* 8.8*  HCT 25.5* 24.1* 24.6* 27.4*  MCV 92.4 92.3 92.8 93.8  PLT 190 200 215 426   Basic Metabolic Panel: Recent Labs  Lab 12/13/20 0444 12/14/20 0043 12/15/20 0303 12/16/20 0320 12/17/20 0220 12/18/20 0112 12/19/20 0052  NA  --  143 141 143 140 137 137  K  --  3.5 3.4* 3.4* 3.5 3.9 3.6  CL  --  106 104 103 103 99 99  CO2  --  23 22 23 23  21* 22  GLUCOSE  --  114* 102* 97 101* 133* 98  BUN  --  108* 100* 96* 95* 96* 111*  CREATININE  --  7.52* 7.08* 7.15* 7.09* 7.47* 7.94*  CALCIUM  --  7.4* 8.0* 8.3* 8.3* 9.0 8.3*  MG 1.4* 1.7  --  1.5*  --  2.0  --   PHOS  --   --   --  6.5* 5.9* 6.0* 6.9*   GFR: Estimated Creatinine Clearance: 5.7 mL/min (A) (by C-G formula based on SCr of 7.94 mg/dL (H)). Liver Function Tests: Recent Labs  Lab 12/16/20 0320 12/17/20 0220 12/18/20 0112 12/19/20 0052  ALBUMIN 2.3* 2.4* 2.6* 2.2*   Cardiac Enzymes: No results for input(s): CKTOTAL, CKMB, CKMBINDEX, TROPONINI in the last 168 hours. BNP (last 3 results) Recent Labs    01/09/20 1443  PROBNP 1,018*   Urine analysis:    Component Value Date/Time   COLORURINE STRAW (A) 12/10/2020 1713   APPEARANCEUR CLEAR 12/10/2020 1713   LABSPEC  1.015 12/10/2020 1713   PHURINE 5.5 12/10/2020 1713   GLUCOSEU NEGATIVE 12/10/2020 1713   HGBUR MODERATE (A) 12/10/2020 1713   BILIRUBINUR NEGATIVE 12/10/2020 1713   KETONESUR NEGATIVE 12/10/2020 1713   PROTEINUR 30 (A) 12/10/2020 1713   UROBILINOGEN 0.2 06/09/2009 0918   NITRITE NEGATIVE 12/10/2020 1713   LEUKOCYTESUR NEGATIVE 12/10/2020 1713   Recent Results (from the past 240 hour(s))  Resp Panel by RT-PCR (Flu A&B, Covid) Nasopharyngeal Swab     Status: None   Collection Time: 12/10/20  5:13 PM   Specimen: Nasopharyngeal Swab; Nasopharyngeal(NP) swabs in vial transport medium  Result Value Ref Range Status   SARS Coronavirus 2 by RT PCR NEGATIVE NEGATIVE Final    Comment: (NOTE) SARS-CoV-2 target nucleic acids are NOT DETECTED.  The SARS-CoV-2 RNA is generally detectable in upper respiratory specimens during the acute phase of infection. The lowest concentration of SARS-CoV-2 viral copies this assay can detect is 138 copies/mL. A negative result does not preclude SARS-Cov-2 infection and should not be used as the sole basis for treatment or other patient management decisions. A negative result may occur with  improper specimen collection/handling, submission of specimen other than nasopharyngeal swab, presence of viral mutation(s) within the areas targeted by this assay, and inadequate number of viral copies(<138 copies/mL). A negative result must be combined with clinical observations, patient history, and epidemiological information. The expected result is Negative.  Fact Sheet for Patients:  EntrepreneurPulse.com.au  Fact Sheet for Healthcare Providers:  IncredibleEmployment.be  This test is no t yet approved or cleared by the [place] FDA and  has been authorized for detection and/or diagnosis of SARS-CoV-2 by FDA under an Emergency Use Authorization (EUA). This EUA will remain  in effect (meaning this test can be used) for the  duration of the COVID-19 declaration under Section 564(b)(1) of the Act, 21 U.S.C.section 360bbb-3(b)(1), unless the authorization is terminated  or revoked sooner.       Influenza A by PCR NEGATIVE NEGATIVE Final   Influenza B by PCR NEGATIVE NEGATIVE Final    Comment: (NOTE) The Xpert Xpress SARS-CoV-2/FLU/RSV plus assay is intended as an aid in the diagnosis of influenza from Nasopharyngeal swab specimens and should not be used as a sole basis for treatment. Nasal washings and aspirates are unacceptable for Xpert Xpress SARS-CoV-2/FLU/RSV testing.  Fact Sheet for Patients: EntrepreneurPulse.com.au  Fact Sheet for Healthcare Providers: IncredibleEmployment.be  This test is not yet approved or cleared by the [place] FDA and has been authorized for detection and/or diagnosis of SARS-CoV-2 by FDA under an Emergency Use Authorization (EUA). This EUA will remain in effect (meaning this test can be used)  for the duration of the COVID-19 declaration under Section 564(b)(1) of the Act, 21 U.S.C. section 360bbb-3(b)(1), unless the authorization is terminated or revoked.  Performed at [facility], [address] [place],  [address]       Radiology Studies: DG CHEST PORT 1 VIEW  Result Date: 12/17/2020 CLINICAL DATA:  History of aortic stent graft repair with persistent cough and hypoxia EXAM: PORTABLE CHEST 1 VIEW COMPARISON:  12/10/2020 FINDINGS: Cardiac shadow is enlarged but stable. Postsurgical changes are noted. Thoracic aortic stent graft is again seen in the descending thoracic aorta stable from the prior exam. Lungs are well aerated bilaterally. Mild atelectatic changes are noted in the right base as well as in the left mid lung. No sizable effusion is noted. No bony abnormality is seen. IMPRESSION: Mild bilateral atelectatic changes. Electronically Signed   By: Inez Catalina M.D.   On: 12/17/2020 15:12     Scheduled Meds: . (feeding supplement) PROSource Plus  30 mL Oral BID BM  . aspirin  81 mg Oral QPC supper  . atorvastatin  20 mg Oral Daily  . calcium carbonate  1 tablet Oral TID WC  . Chlorhexidine Gluconate Cloth  6 each Topical Daily  . feeding supplement  1 Container Oral TID BM  . ferrous sulfate  325 mg Oral Q breakfast  . heparin injection (subcutaneous)  5,000 Units Subcutaneous Q8H  . midodrine  5 mg Oral TID WC   Continuous Infusions: . sodium chloride 75 mL/hr at 12/19/20 0825     LOS: 9 days   Time spent: 35 minutes.  Patrecia Pour, MD [facility] [email or web address] 12/19/2020, 10:49 AM

## 2020-12-20 ENCOUNTER — Inpatient Hospital Stay (HOSPITAL_COMMUNITY): Payer: Medicare Other

## 2020-12-20 DIAGNOSIS — I714 Abdominal aortic aneurysm, without rupture: Secondary | ICD-10-CM | POA: Diagnosis not present

## 2020-12-20 DIAGNOSIS — I5032 Chronic diastolic (congestive) heart failure: Secondary | ICD-10-CM | POA: Diagnosis not present

## 2020-12-20 DIAGNOSIS — J189 Pneumonia, unspecified organism: Secondary | ICD-10-CM | POA: Diagnosis not present

## 2020-12-20 DIAGNOSIS — N179 Acute kidney failure, unspecified: Secondary | ICD-10-CM | POA: Diagnosis not present

## 2020-12-20 HISTORY — PX: IR US GUIDE VASC ACCESS LEFT: IMG2389

## 2020-12-20 HISTORY — PX: IR FLUORO GUIDE CV LINE LEFT: IMG2282

## 2020-12-20 LAB — CBC
HCT: 26.8 % — ABNORMAL LOW (ref 39.0–52.0)
Hemoglobin: 8.6 g/dL — ABNORMAL LOW (ref 13.0–17.0)
MCH: 30.5 pg (ref 26.0–34.0)
MCHC: 32.1 g/dL (ref 30.0–36.0)
MCV: 95 fL (ref 80.0–100.0)
Platelets: 328 10*3/uL (ref 150–400)
RBC: 2.82 MIL/uL — ABNORMAL LOW (ref 4.22–5.81)
RDW: 13.6 % (ref 11.5–15.5)
WBC: 10 10*3/uL (ref 4.0–10.5)
nRBC: 0.2 % (ref 0.0–0.2)

## 2020-12-20 LAB — BASIC METABOLIC PANEL
Anion gap: 12 (ref 5–15)
BUN: 108 mg/dL — ABNORMAL HIGH (ref 8–23)
CO2: 22 mmol/L (ref 22–32)
Calcium: 7.9 mg/dL — ABNORMAL LOW (ref 8.9–10.3)
Chloride: 105 mmol/L (ref 98–111)
Creatinine, Ser: 7.44 mg/dL — ABNORMAL HIGH (ref 0.61–1.24)
GFR, Estimated: 7 mL/min — ABNORMAL LOW (ref >60)
Glucose, Bld: 107 mg/dL — ABNORMAL HIGH (ref 70–99)
Potassium: 3.5 mmol/L (ref 3.5–5.1)
Sodium: 139 mmol/L (ref 135–145)

## 2020-12-20 MED ORDER — LIDOCAINE-EPINEPHRINE 1 %-1:100000 IJ SOLN
INTRAMUSCULAR | Status: AC
  Filled 2020-12-20: qty 1

## 2020-12-20 MED ORDER — SODIUM CHLORIDE 0.9 % IV SOLN: 100.0000 mL | INTRAVENOUS | Status: DC | PRN

## 2020-12-20 MED ORDER — HEPARIN SODIUM (PORCINE) 1000 UNIT/ML DIALYSIS: 1000.0000 [IU] | INTRAMUSCULAR | Status: DC | PRN

## 2020-12-20 MED ORDER — ALTEPLASE 2 MG IJ SOLR: 2.0000 mg | Freq: Once | INTRAMUSCULAR | Status: DC | PRN

## 2020-12-20 MED ORDER — PENTAFLUOROPROP-TETRAFLUOROETH EX AERO: 1.0000 "application " | INHALATION_SPRAY | CUTANEOUS | Status: DC | PRN

## 2020-12-20 MED ORDER — HEPARIN SODIUM (PORCINE) 1000 UNIT/ML IJ SOLN
INTRAMUSCULAR | Status: AC
  Filled 2020-12-20: qty 1

## 2020-12-20 MED ORDER — LIDOCAINE HCL (PF) 1 % IJ SOLN: 5.0000 mL | INTRAMUSCULAR | Status: DC | PRN

## 2020-12-20 MED ORDER — LIDOCAINE-PRILOCAINE 2.5-2.5 % EX CREA: 1.0000 "application " | TOPICAL_CREAM | CUTANEOUS | Status: DC | PRN

## 2020-12-20 NOTE — Progress Notes (Signed)
Patient ID: BELVIN GAUSS, male   DOB: 1931/11/27, 87 y.o.   MRN: [id number] [facility] Progress Note   Assessment/ Plan:   1. Acute kidney Injury: Nonoliguric with drop off of urine output-etiology suspected to be in part from volume depletion/ischemic ATN and obstruction with some retention seen earlier.  Fair urine output noted from overnight with stable hemodynamic state and labs pending from this morning to decide on need for [facility] placement/dialysis if has worsening azotemia. 2.  Anemia: Secondary to iron deficiency/acute illness.  On oral iron and status post ESA.  No overt blood loss. 3.  Acute hypoxic respiratory failure: 4.  Hyperphosphatemia: Continue renal diet and calcium carbonate with meals 3 times daily AC. 5.  Hypotension: On scheduled midodrine to allow for improved blood pressure/renal perfusion and assist with renal recovery.  Subjective:   Reports to be feeling tired this morning, wife concerned that he seems a little more somnolent.   Objective:   BP (!) 139/55 (BP Location: Left Arm)   Pulse 82   Temp 97.9 F (36.6 C) (Oral)   Resp 13   Ht 5\' 8"  (1.727 m)   Wt 59.9 kg   SpO2 95%   BMI 20.08 kg/m   Intake/Output Summary (Last 24 hours) at 12/20/2020 0750 Last data filed at 12/20/2020 0332 Gross per 24 hour  Intake 361.89 ml  Output 1320 ml  Net -958.11 ml   Weight change: 0 kg  Physical Exam: Gen: Appears comfortable resting in bed, delayed verbal responses to questions CVS: Pulse regular rhythm, normal rate, S1 and S2 normal Resp: Coarse/transmitted breath sounds bilaterally without rales/rhonchi Abd: Soft, flat, nontender, bowel sounds are normal Ext: No palpable lower extremity edema  Imaging: No results found.  Labs: BMET Recent Labs  Lab 12/14/20 0043 12/15/20 0303 12/16/20 0320 12/17/20 0220 12/18/20 0112 12/19/20 0052  NA 143 141 143 140 137 137  K 3.5 3.4* 3.4* 3.5 3.9 3.6  CL 106 104 103 103 99 99  CO2 23 22 23 23   21* 22  GLUCOSE 114* 102* 97 101* 133* 98  BUN 108* 100* 96* 95* 96* 111*  CREATININE 7.52* 7.08* 7.15* 7.09* 7.47* 7.94*  CALCIUM 7.4* 8.0* 8.3* 8.3* 9.0 8.3*  PHOS  --   --  6.5* 5.9* 6.0* 6.9*   CBC Recent Labs  Lab 12/14/20 0043 12/15/20 0303 12/17/20 0220 12/20/20 0112  WBC 10.4 10.1 8.4 10.0  HGB 8.0* 8.1* 8.8* 8.6*  HCT 24.1* 24.6* 27.4* 26.8*  MCV 92.3 92.8 93.8 95.0  PLT 200 215 266 328    Medications:    . (feeding supplement) PROSource Plus  30 mL Oral BID BM  . aspirin  81 mg Oral QPC supper  . atorvastatin  20 mg Oral Daily  . calcium carbonate  1 tablet Oral TID WC  . Chlorhexidine Gluconate Cloth  6 each Topical Daily  . feeding supplement  1 Container Oral TID BM  . ferrous sulfate  325 mg Oral Q breakfast  . heparin injection (subcutaneous)  5,000 Units Subcutaneous Q8H  . midodrine  5 mg Oral TID WC   Elmarie Shiley, MD 12/20/2020, 7:50 AM

## 2020-12-20 NOTE — Progress Notes (Signed)
PROGRESS NOTE  Andrew [name]  BJY:[id number] DOB: 1931/12/27 DOA: 12/10/2020 PCP: [name], MD  Outpatient Specialists: Vascular surgery, Dr. Carlis Abbott Brief Narrative: Andrew [name] is an 87 y.o.malewith a history of thoracic aortic aneurysm status post stent graft repair, AAA, CABG in 2010, chronic HFpEF, BPH with urinary retention, CKDII,and remote atrial fibrillation not anticoagulated who presented to the ED 5/20 prompted by PCP for abnormal blood work which was drawn in the setting of cough that hadn't improved with augmentin. He also had been losing appetite and feeling unwell.  Labs were notable for mild leukocytosis, worsening anemia, renal failure. CT the chest, abdomen, and pelvis is notable for new left upper lobe infiltrate, unchanged stent graft repair of thoracic aortic aneurysm, and grossly stable infrarenal AAA. Patient was treated with rocephin, azithromycin, albuterol, and IV fluids in the ED. SBP nadir was in 70's shortly after admission. Creatinine was 8.71 worsening to 9.32 from previous of 1.09 in Aug 2021. Nephrology was consulted, confirmed patient's desire to forgo dialysis even if life-saving, and continues intensive medical management with only very limited improvement. Palliative care following along as well.   On 5/27 patient developed lower abdominal discomfort, decreased UOP from foley which was found not to be draining despite flushes. This was replaced with 1300cc urine return and subsequent post-obstructive diuresis and hypotension improved with midodrine. IV fluids are restarted. Further goals of care discussions have continued. The patient and family now confirm their desire for trial of hemodialysis if there continues to be no renal recovery. Trialysis catheter was placed 5/30.   Assessment & Plan: Principal Problem:   AKI (acute kidney injury) ([place]) Active Problems:   Thoracic aortic aneurysm without rupture ([place])   Coronary artery disease    Urinary retention   AAA (abdominal aortic aneurysm) without rupture ([facility])   Essential hypertension   Normocytic anemia   Chronic diastolic CHF (congestive heart failure) ([place])   Community acquired pneumonia  Acute renal failure superimposed on CKD II; uremia; metabolic acidosis: Initial BUN 161, Cr 8.71 (44 &1.09 in Aug 2021) with serum bicarb of 17. Kidneys appear normal with no obstruction on CT in ED. Relatively bland micro and proteinuria. Slight acidosis on labs 5/28, worsened on 5/28.  -Appreciate nephrology assistance. Trialysis catheter was placed 5/30. No appreciable recovery of renal clearance, though remains with adequate UOP. Defer to nephrology for initiation of dialysis.  - Continue midodrine and foley catheter (last exchanged 5/28).  - Monitoring I/O, daily renal function panel.  - Avoiding nephrotoxins.    Hypokalemia:  - Supplemented. Continue monitoring  Hypomagnesemia: Resolved - Continue monitoring intermittently  Hyperphosphatemia:  - Renal diet, calcium carbonate w/meals. Improving.  Acute hypoxic respiratory failure: Due to CAP, possible aspiration, possible renal failure-related volume overload.  - Continue supplemental oxygen to maintain SpO2 >90% with normal WOB. CXR 5/27 personally reviewed showing very mild interstitial prominence and bibasilar faint opacities consistent with atelectasis. This seems to be stable.   LUL CAP: Has completed 5 days of ceftriaxone, azithromycin. Remains afebrile without leukocytosis.  Hypotension: Improving. MAPs still consistently elevated. - Continue midodrine at current dose, can modify if needed for dialysis. Need to avoid hypotension as this may have caused ATN insult initially  Urinary retention: Chronic, BPH.  - Continue foley with ongoing assessment of renal failure. Exchanged 12/18/2020.   CAD: History of PCI and CABG 2010. No anginal complaints currently.  -Continue ASA, statin  Chronic HFpEF: Echo 5/26  unchanged from prior with LVEF 60-65%, unevaluated diastolic  function, no RWMAs. Normal RV, no severe valvular disease.  - Manage volume with dialysis  - Monitoring I/O, daily weights  Thoracic aortic aneurysm s/p repair: Stable  AAA: Appears stable on CT in ED -Plan to keep VVS appointment with repeat U/S on 6/21 with Dr. Carlis Abbott.   Iron deficiency anemia: No acute bleeding noted. Hgb rebounding s/p aranesp per nephrology. - Monitor intermittently.  - Daily iron supplement.  DVT prophylaxis: Heparin [place] Code Status: Full Family Communication: Wife at bedside this AM Disposition Plan:  Status is: Inpatient  Remains inpatient appropriate because:Persistent severe electrolyte disturbances  Dispo: The patient is from: Home              Anticipated d/c is to: TBD - CIR vs. SNF insurance denied authorization for CIR after peer-to-peer by myself 5/27, though that was prior to patient and family's desire to trial dialysis. He likely meets medical criteria for admission to CIR at this time.                Patient currently is not medically stable to d/c.   Difficult to place patient No  Consultants:   Nephrology  Palliative care  CIR  Procedures:   None  Antimicrobials:  Ceftriaxone, azithromycin   Subjective: Some po intake this morning though he is very slow to respond and seems drowsier than before per pt's wife. He's cough and reports some dyspnea. No fever or chest pain reported. Having some bleeding when seen immediately after left IJ catheter placement. This seems to have stabilized in the past 10 minutes.   Objective: Vitals:   12/20/20 0340 12/20/20 0500 12/20/20 0725 12/20/20 0726  BP: (!) 137/56  (!) 103/39 (!) 139/55  Pulse: 86  80 82  Resp: _0 Temp: 98.1 F (36.7 C)  97.9 F (36.6 C)   TempSrc: Oral  Oral   SpO2: 94%  96% 95%  Weight:  59.9 kg    Height:        Intake/Output Summary (Last 24 hours) at 12/20/2020 1205 Last data filed at  12/20/2020 0332 Gross per 24 hour  Intake 361.89 ml  Output 1320 ml  Net -958.11 ml   Filed Weights   12/11/20 0500 12/19/20 0521 12/20/20 0500  Weight: 69.7 kg 59.9 kg 59.9 kg   Gen: Elderly male in no distress, but ill-appearing Pulm: Nonlabored breathing supplemental oxygen, no wheezes or crackles. CV: Regular rate and rhythm. No murmur, rub, or gallop. No JVD, no pitting dependent edema. GI: Abdomen soft, non-tender, non-distended, with normoactive bowel sounds. +wide open hernia, no palpable mass/bladder. Ext: Warm, no deformities Skin: No rashes, lesions or ulcers on visualized skin. Left IJ dialysis cath with fresh blood around incision site. RN placed pressure dressing during encounter and this appears to be hemostatic at this time.  Neuro: Drowsy but responsive, no asterixis or focal neurological deficits noted. Psych: Judgement and insight appear intact. Calm.   Data Reviewed: I have personally reviewed following labs and imaging studies  CBC: Recent Labs  Lab 12/14/20 0043 12/15/20 0303 12/17/20 0220 12/20/20 0112  WBC 10.4 10.1 8.4 10.0  HGB 8.0* 8.1* 8.8* 8.6*  HCT 24.1* 24.6* 27.4* 26.8*  MCV 92.3 92.8 93.8 95.0  PLT 200 215 266 383   Basic Metabolic Panel: Recent Labs  Lab 12/14/20 0043 12/15/20 0303 12/16/20 0320 12/17/20 0220 12/18/20 0112 12/19/20 0052 12/20/20 0716  NA 143   < > 143 140 137 137 139  K 3.5   < >  3.4* 3.5 3.9 3.6 3.5  CL 106   < > 103 103 99 99 105  CO2 23   < > 23 23 21* 22 22  GLUCOSE 114*   < > 97 101* 133* 98 107*  BUN 108*   < > 96* 95* 96* 111* 108*  CREATININE 7.52*   < > 7.15* 7.09* 7.47* 7.94* 7.44*  CALCIUM 7.4*   < > 8.3* 8.3* 9.0 8.3* 7.9*  MG 1.7  --  1.5*  --  2.0  --   --   PHOS  --   --  6.5* 5.9* 6.0* 6.9*  --    < > = values in this interval not displayed.   GFR: Estimated Creatinine Clearance: 6 mL/min (A) (by C-G formula based on SCr of 7.44 mg/dL (H)). Liver Function Tests: Recent Labs  Lab  12/16/20 0320 12/17/20 0220 12/18/20 0112 12/19/20 0052  ALBUMIN 2.3* 2.4* 2.6* 2.2*   Cardiac Enzymes: No results for input(s): CKTOTAL, CKMB, CKMBINDEX, TROPONINI in the last 168 hours. BNP (last 3 results) Recent Labs    01/09/20 1443  PROBNP 1,018*   Urine analysis:    Component Value Date/Time   COLORURINE STRAW (A) 12/10/2020 1713   APPEARANCEUR CLEAR 12/10/2020 1713   LABSPEC 1.015 12/10/2020 1713   PHURINE 5.5 12/10/2020 1713   GLUCOSEU NEGATIVE 12/10/2020 1713   HGBUR MODERATE (A) 12/10/2020 1713   BILIRUBINUR NEGATIVE 12/10/2020 1713   KETONESUR NEGATIVE 12/10/2020 1713   PROTEINUR 30 (A) 12/10/2020 1713   UROBILINOGEN 0.2 06/09/2009 0918   NITRITE NEGATIVE 12/10/2020 1713   LEUKOCYTESUR NEGATIVE 12/10/2020 1713   Recent Results (from the past 240 hour(s))  Resp Panel by RT-PCR (Flu A&B, Covid) Nasopharyngeal Swab     Status: None   Collection Time: 12/10/20  5:13 PM   Specimen: Nasopharyngeal Swab; Nasopharyngeal(NP) swabs in vial transport medium  Result Value Ref Range Status   SARS Coronavirus 2 by RT PCR NEGATIVE NEGATIVE Final    Comment: (NOTE) SARS-CoV-2 target nucleic acids are NOT DETECTED.  The SARS-CoV-2 RNA is generally detectable in upper respiratory specimens during the acute phase of infection. The lowest concentration of SARS-CoV-2 viral copies this assay can detect is 138 copies/mL. A negative result does not preclude SARS-Cov-2 infection and should not be used as the sole basis for treatment or other patient management decisions. A negative result may occur with  improper specimen collection/handling, submission of specimen other than nasopharyngeal swab, presence of viral mutation(s) within the areas targeted by this assay, and inadequate number of viral copies(<138 copies/mL). A negative result must be combined with clinical observations, patient history, and epidemiological information. The expected result is Negative.  Fact Sheet  for Patients:  EntrepreneurPulse.com.au  Fact Sheet for Healthcare Providers:  IncredibleEmployment.be  This test is no t yet approved or cleared by the [place] FDA and  has been authorized for detection and/or diagnosis of SARS-CoV-2 by FDA under an Emergency Use Authorization (EUA). This EUA will remain  in effect (meaning this test can be used) for the duration of the COVID-19 declaration under Section 564(b)(1) of the Act, 21 U.S.C.section 360bbb-3(b)(1), unless the authorization is terminated  or revoked sooner.       Influenza A by PCR NEGATIVE NEGATIVE Final   Influenza B by PCR NEGATIVE NEGATIVE Final    Comment: (NOTE) The Xpert Xpress SARS-CoV-2/FLU/RSV plus assay is intended as an aid in the diagnosis of influenza from Nasopharyngeal swab specimens and should not be used as  a sole basis for treatment. Nasal washings and aspirates are unacceptable for Xpert Xpress SARS-CoV-2/FLU/RSV testing.  Fact Sheet for Patients: EntrepreneurPulse.com.au  Fact Sheet for Healthcare Providers: IncredibleEmployment.be  This test is not yet approved or cleared by the [place] FDA and has been authorized for detection and/or diagnosis of SARS-CoV-2 by FDA under an Emergency Use Authorization (EUA). This EUA will remain in effect (meaning this test can be used) for the duration of the COVID-19 declaration under Section 564(b)(1) of the Act, 21 U.S.C. section 360bbb-3(b)(1), unless the authorization is terminated or revoked.  Performed at [facility], [address] [place], [place] [address]       Radiology Studies: IR Fluoro Guide CV Line Left  Result Date: 12/20/2020 INDICATION: 87 year old male with history of worsening azotemia. EXAM: NON-TUNNELED CENTRAL VENOUS HEMODIALYSIS CATHETER PLACEMENT WITH ULTRASOUND AND FLUOROSCOPIC GUIDANCE COMPARISON:  None. MEDICATIONS: None  FLUOROSCOPY TIME:  minutes,   seconds (  mGy) COMPLICATIONS: None immediate. PROCEDURE: Informed written consent was obtained from the patient after a discussion of the risks, benefits, and alternatives to treatment. Questions regarding the procedure were encouraged and answered. Preprocedure ultrasound evaluation demonstrated occlusion and possible variant anatomy of the right internal jugular vein, without safe access point for central line insertion. Therefore, the left neck and chest were prepped with chlorhexidine in a sterile fashion, and a sterile drape was applied covering the operative field. Maximum barrier sterile technique with sterile gowns and gloves were used for the procedure. A timeout was performed prior to the initiation of the procedure. After the overlying soft tissues were anesthetized, a small venotomy incision was created and a micropuncture kit was utilized to access the internal jugular vein. Real-time ultrasound guidance was utilized for vascular access including the acquisition of a permanent ultrasound image documenting patency of the accessed vessel. The microwire was utilized to measure appropriate catheter length. A guidewire was advanced to the level of the IVC. Under fluoroscopic guidance, the venotomy was serially dilated, ultimately allowing placement of a 24 cm temporary Trialysis catheter with tip ultimately terminating within the superior aspect of the right atrium. Final catheter positioning was confirmed and documented with a spot radiographic image. The catheter aspirates and flushes normally. The catheter was flushed with appropriate volume heparin dwells. The catheter exit site was secured with a 0 silk retention suture. A dressing was placed. The patient tolerated the procedure well without immediate post procedural complication. IMPRESSION: Successful placement of a left internal jugular approach 24 cm temporary dialysis catheter with tip terminating with in the superior  aspect of the right atrium. The catheter is ready for immediate use. PLAN: This catheter may be converted to a tunneled dialysis catheter at a later date as indicated. [name], MD [facility] Radiology Electronically Signed   By: [name] MD   On: 12/20/2020 11:11   IR US Guide Vasc Access Left  Result Date: 12/20/2020 INDICATION: 87 year old male with history of worsening azotemia. EXAM: NON-TUNNELED CENTRAL VENOUS HEMODIALYSIS CATHETER PLACEMENT WITH ULTRASOUND AND FLUOROSCOPIC GUIDANCE COMPARISON:  None. MEDICATIONS: None FLUOROSCOPY TIME:  minutes,   seconds (  mGy) COMPLICATIONS: None immediate. PROCEDURE: Informed written consent was obtained from the patient after a discussion of the risks, benefits, and alternatives to treatment. Questions regarding the procedure were encouraged and answered. Preprocedure ultrasound evaluation demonstrated occlusion and possible variant anatomy of the right internal jugular vein, without safe access point for central line insertion. Therefore, the left neck and chest were  prepped with chlorhexidine in a sterile fashion, and a sterile drape was applied covering the operative field. Maximum barrier sterile technique with sterile gowns and gloves were used for the procedure. A timeout was performed prior to the initiation of the procedure. After the overlying soft tissues were anesthetized, a small venotomy incision was created and a micropuncture kit was utilized to access the internal jugular vein. Real-time ultrasound guidance was utilized for vascular access including the acquisition of a permanent ultrasound image documenting patency of the accessed vessel. The microwire was utilized to measure appropriate catheter length. A guidewire was advanced to the level of the IVC. Under fluoroscopic guidance, the venotomy was serially dilated, ultimately allowing placement of a 24 cm temporary Trialysis catheter with  tip ultimately terminating within the superior aspect of the right atrium. Final catheter positioning was confirmed and documented with a spot radiographic image. The catheter aspirates and flushes normally. The catheter was flushed with appropriate volume heparin dwells. The catheter exit site was secured with a 0 silk retention suture. A dressing was placed. The patient tolerated the procedure well without immediate post procedural complication. IMPRESSION: Successful placement of a left internal jugular approach 24 cm temporary dialysis catheter with tip terminating with in the superior aspect of the right atrium. The catheter is ready for immediate use. PLAN: This catheter may be converted to a tunneled dialysis catheter at a later date as indicated. [name], MD [facility] Radiology Electronically Signed   By: [name] MD   On: 12/20/2020 11:11    Scheduled Meds: . (feeding supplement) PROSource Plus  30 mL Oral BID BM  . aspirin  81 mg Oral QPC supper  . atorvastatin  20 mg Oral Daily  . calcium carbonate  1 tablet Oral TID WC  . Chlorhexidine Gluconate Cloth  6 each Topical Daily  . feeding supplement  1 Container Oral TID BM  . ferrous sulfate  325 mg Oral Q breakfast  . heparin injection (subcutaneous)  5,000 Units Subcutaneous Q8H  . heparin sodium (porcine)      . lidocaine-EPINEPHrine      . midodrine  5 mg Oral TID WC   Continuous Infusions:    LOS: 10 days   Time spent: 35 minutes.  Patrecia Pour, MD [facility] [email or web address] 12/20/2020, 12:05 PM

## 2020-12-20 NOTE — TOC Progression Note (Signed)
Transition of Care [facility]) - Progression Note    Patient Details  Name: Andrew [name] MRN: [id number] Date of Birth: 02/04/1932  [facility] CM/SW Contact  Joanne Chars, LCSW Phone Number: 12/20/2020, 10:17 AM  Clinical Narrative:   CSW spoke with [place] at [facility]--pt is on wait list, she can email application and they could review within a few days for potential admission. CSW spoke with pt wife regarding bed offers, she provided email for [facility].  Also considering [facility], [facility], [facility], [facility].  Another choice document provided so she can have all the ratings.        Expected Discharge Plan: [place] Barriers to Discharge: Insurance Authorization,Continued Medical Work up  Expected Discharge Plan and Services Expected Discharge Plan: [place] Choice: [place] arrangements for the past 2 months: Single Family Home                                       Social Determinants of Health (SDOH) Interventions    Readmission Risk Interventions No flowsheet data found.

## 2020-12-20 NOTE — Procedures (Signed)
Interventional Radiology Procedure Note  Procedure: Temporary hemodialysis catheter placement  Findings: Please refer to procedural dictation for full description. Right IJ occluded.  Left IJ 24 cm Trialysis catheter placed, tip at cavoatrial junction.  Complications: None immediate  Estimated Blood Loss: < 5 mL  Recommendations: Catheter ready for immediate use.   [name], MD

## 2020-12-20 NOTE — Progress Notes (Signed)
Physical Therapy Treatment Patient Details Name: Andrew [name] MRN: [id number] DOB: 1931/09/13 Today's Date: 12/20/2020    History of Present Illness 87 y.o. male presented 12/10/20 to the emergency department for evaluation of abnormal blood work. Acute renal failure with uremia;  CT the chest, abdomen, and pelvis is notable for pneumonia. Pt had temporary HD catheter placed 5/30 in preparation for HD. PMH- thoracic aortic aneurysm status post stent graft repair, CABG in 2010, AAA, chronic diastolic CHF, BPH, urinary retention, CKD II, and remote atrial fibrillation not anticoagulated, back surgery    PT Comments    Pt very weak and fatigued today. Unable to fully stand with 2 person max assist. Pt said dialysis wore him out although he went for placement of HD cath and didn't actually go to HD yet. Agree with plan for SNF. Will see how he tolerates activity with start of HD.    Follow Up Recommendations  SNF     Equipment Recommendations  Wheelchair (measurements PT);Wheelchair cushion (measurements PT);Hospital bed    Recommendations for Other Services       Precautions / Restrictions Precautions Precautions: Fall    Mobility  Bed Mobility Overal bed mobility: Needs Assistance Bed Mobility: Sidelying to Sit;Rolling Rolling: Max assist Sidelying to sit: +2 for physical assistance;Max assist;HOB elevated       General bed mobility comments: Assist to bring legs off of bed, elevate trunk into sitting and bring hips to EOB    Transfers Overall transfer level: Needs assistance Equipment used: Rolling walker (2 wheeled) Transfers: Sit to/from Stand Sit to Stand: +2 physical assistance;Max assist         General transfer comment: Heavy assist and use of bed pad to bring hips up enough to get seat of stedy in place. Pt unable to fully stand. Used Stedy for bed to chair  Ambulation/Gait             General Gait Details: Economist Rankin (Stroke Patients Only)       Balance Overall balance assessment: Needs assistance Sitting-balance support: Bilateral upper extremity supported;Feet supported Sitting balance-Leahy Scale: Poor Sitting balance - Comments: mod assist to correct posterior lean Postural control: Posterior lean Standing balance support: Bilateral upper extremity supported Standing balance-Leahy Scale: Zero Standing balance comment: Unable to fully stand with Stedy with +2 max assist                            Cognition Arousal/Alertness: Awake/alert Behavior During Therapy: WFL for tasks assessed/performed Overall Cognitive Status: Impaired/Different from baseline Area of Impairment: Following commands;Problem solving;Memory;Safety/judgement                     Memory: Decreased short-term memory Following Commands: Follows one step commands with increased time;Follows one step commands inconsistently Safety/Judgement: Decreased awareness of deficits   Problem Solving: Slow processing;Decreased initiation;Requires verbal cues;Requires tactile cues;Difficulty sequencing        Exercises      General Comments General comments (skin integrity, edema, etc.): Pt's wife present      Pertinent Vitals/Pain Pain Assessment: Faces Faces Pain Scale: Hurts little more Pain Location: lt side of neck at site of HD catheter    Home Living  Prior Function            PT Goals (current goals can now be found in the care plan section) Acute Rehab PT Goals Patient Stated Goal: to get strong enough to go home Progress towards PT goals: Not progressing toward goals - comment    Frequency    Min 2X/week      PT Plan Discharge plan needs to be updated;Frequency needs to be updated    Co-evaluation              AM-PAC PT "6 Clicks" Mobility   Outcome Measure  Help needed turning from your back to  your side while in a flat bed without using bedrails?: A Lot Help needed moving from lying on your back to sitting on the side of a flat bed without using bedrails?: Total Help needed moving to and from a bed to a chair (including a wheelchair)?: Total Help needed standing up from a chair using your arms (e.g., wheelchair or bedside chair)?: Total Help needed to walk in hospital room?: Total Help needed climbing 3-5 steps with a railing? : Total 6 Click Score: 7    End of Session Equipment Utilized During Treatment: Gait belt;Oxygen Activity Tolerance: Patient limited by fatigue Patient left: with call bell/phone within reach;with family/visitor present;in chair;with chair alarm set Nurse Communication: Mobility status;Need for lift equipment (Stedy vs maximove) PT Visit Diagnosis: Muscle weakness (generalized) (M62.81);Difficulty in walking, not elsewhere classified (R26.2)     Time: 6761-9509 PT Time Calculation (min) (ACUTE ONLY): 22 min  Charges:  $Therapeutic Activity: 8-22 mins                     [place] Pager [phone] Office [place] 12/20/2020, 3:32 PM

## 2020-12-21 DIAGNOSIS — N179 Acute kidney failure, unspecified: Secondary | ICD-10-CM | POA: Diagnosis not present

## 2020-12-21 DIAGNOSIS — I5032 Chronic diastolic (congestive) heart failure: Secondary | ICD-10-CM | POA: Diagnosis not present

## 2020-12-21 DIAGNOSIS — I714 Abdominal aortic aneurysm, without rupture: Secondary | ICD-10-CM | POA: Diagnosis not present

## 2020-12-21 DIAGNOSIS — J189 Pneumonia, unspecified organism: Secondary | ICD-10-CM | POA: Diagnosis not present

## 2020-12-21 LAB — CBC
HCT: 24.4 % — ABNORMAL LOW (ref 39.0–52.0)
Hemoglobin: 7.9 g/dL — ABNORMAL LOW (ref 13.0–17.0)
MCH: 30.4 pg (ref 26.0–34.0)
MCHC: 32.4 g/dL (ref 30.0–36.0)
MCV: 93.8 fL (ref 80.0–100.0)
Platelets: 267 10*3/uL (ref 150–400)
RBC: 2.6 MIL/uL — ABNORMAL LOW (ref 4.22–5.81)
RDW: 13.8 % (ref 11.5–15.5)
WBC: 9.1 10*3/uL (ref 4.0–10.5)
nRBC: 0.2 % (ref 0.0–0.2)

## 2020-12-21 LAB — RENAL FUNCTION PANEL
Albumin: 2 g/dL — ABNORMAL LOW (ref 3.5–5.0)
Anion gap: 10 (ref 5–15)
BUN: 64 mg/dL — ABNORMAL HIGH (ref 8–23)
CO2: 27 mmol/L (ref 22–32)
Calcium: 8.5 mg/dL — ABNORMAL LOW (ref 8.9–10.3)
Chloride: 103 mmol/L (ref 98–111)
Creatinine, Ser: 4.61 mg/dL — ABNORMAL HIGH (ref 0.61–1.24)
GFR, Estimated: 12 mL/min — ABNORMAL LOW (ref >60)
Glucose, Bld: 98 mg/dL (ref 70–99)
Phosphorus: 3.8 mg/dL (ref 2.5–4.6)
Potassium: 3 mmol/L — ABNORMAL LOW (ref 3.5–5.1)
Sodium: 140 mmol/L (ref 135–145)

## 2020-12-21 MED ORDER — MIDODRINE HCL 5 MG PO TABS
5.0000 mg | ORAL_TABLET | Freq: Once | ORAL | Status: AC
  Administered 2020-12-21: 5 mg via ORAL
  Filled 2020-12-21: qty 1

## 2020-12-21 MED ORDER — POLYETHYLENE GLYCOL 3350 17 G PO PACK
17.0000 g | PACK | Freq: Every day | ORAL | Status: DC
  Administered 2020-12-21 – 2020-12-25 (×4): 17 g via ORAL
  Filled 2020-12-21 (×5): qty 1

## 2020-12-21 MED ORDER — SODIUM CHLORIDE 0.9 % IV BOLUS
500.0000 mL | Freq: Once | INTRAVENOUS | Status: AC
  Administered 2020-12-21: 500 mL via INTRAVENOUS

## 2020-12-21 MED ORDER — HEPARIN SODIUM (PORCINE) 1000 UNIT/ML IJ SOLN
INTRAMUSCULAR | Status: AC
  Filled 2020-12-21: qty 4

## 2020-12-21 MED ORDER — POTASSIUM CHLORIDE CRYS ER 20 MEQ PO TBCR
20.0000 meq | EXTENDED_RELEASE_TABLET | Freq: Two times a day (BID) | ORAL | Status: AC
  Administered 2020-12-21 (×2): 20 meq via ORAL
  Filled 2020-12-21 (×2): qty 1

## 2020-12-21 MED ORDER — MIDODRINE HCL 5 MG PO TABS
10.0000 mg | ORAL_TABLET | Freq: Three times a day (TID) | ORAL | Status: DC
  Administered 2020-12-21 – 2021-01-03 (×37): 10 mg via ORAL
  Filled 2020-12-21 (×37): qty 2

## 2020-12-21 MED ORDER — SENNOSIDES-DOCUSATE SODIUM 8.6-50 MG PO TABS
1.0000 | ORAL_TABLET | Freq: Two times a day (BID) | ORAL | Status: DC
  Administered 2020-12-21 – 2021-01-10 (×27): 1 via ORAL
  Filled 2020-12-21 (×38): qty 1

## 2020-12-21 NOTE — Progress Notes (Addendum)
Patient ID: Andrew [name], male   DOB: Jul 27, 1931, 87 y.o.   MRN: [id number] [facility] Progress Note   Assessment/ Plan:   1. Acute kidney Injury: Nonoliguric with drop off of urine output-etiology suspected to be in part from volume depletion/ischemic ATN and obstruction with some retention seen earlier.  Discouraging to see that urine output from overnight is charted as 0-unclear whether this is accurate or not.  Regardless, he had hemodialysis earlier this morning because of concerns of changes in mental status that may have been associated with his azotemia.  Labs yesterday morning showed slight improvement of BUN/creatinine which may have been the early signs of renal recovery.  I appreciate the assistance from IR with placing his L IJ dialysis catheter. 2.  Anemia: Secondary to iron deficiency/acute illness.  On oral iron and status post ESA.  He had some bleeding/oozing from dialysis catheter insertion site that may have been compounded by azotemia/platelet dysfunction. 3.  Acute hypoxic respiratory failure: Associated with community-acquired pneumonia for which he has completed his course of azithromycin/ceftriaxone and remains afebrile/without leukocytosis.  On supplemental oxygenation via nasal cannula. 4.  Hyperphosphatemia: Continue renal diet and calcium carbonate with meals 3 times daily AC. 5.  Hypotension: On scheduled midodrine to allow for improved blood pressure/renal perfusion and will follow blood pressure trend closely to decide on need to wean this off.  Subjective:   States that he had a rough night with difficulty sleeping because of timing of dialysis.   Objective:   BP 128/77 (BP Location: Right Arm)   Pulse 73   Temp 98.4 F (36.9 C) (Oral)   Resp 13   Ht $R'5\' 8"'fJ$  (1.727 m)   Wt 59.9 kg   SpO2 98%   BMI 20.08 kg/m   Intake/Output Summary (Last 24 hours) at 12/21/2020 7062 Last data filed at 12/21/2020 0303 Gross per 24 hour  Intake --  Output 0 ml   Net 0 ml   Weight change: 0 kg  Physical Exam: Gen: Appears chronically ill and somewhat uncomfortable resting in bed, wife at his bedside CVS: Pulse regular rhythm, normal rate, S1 and S2 normal Resp: Coarse/transmitted breath sounds bilaterally without rales/rhonchi Abd: Soft, flat, nontender, bowel sounds are normal Ext: No palpable lower extremity edema  Imaging: IR Fluoro Guide CV Line Left  Result Date: 12/20/2020 INDICATION: 87 year old male with history of worsening azotemia. EXAM: NON-TUNNELED CENTRAL VENOUS HEMODIALYSIS CATHETER PLACEMENT WITH ULTRASOUND AND FLUOROSCOPIC GUIDANCE COMPARISON:  None. MEDICATIONS: None FLUOROSCOPY TIME:  minutes,   seconds (  mGy) COMPLICATIONS: None immediate. PROCEDURE: Informed written consent was obtained from the patient after a discussion of the risks, benefits, and alternatives to treatment. Questions regarding the procedure were encouraged and answered. Preprocedure ultrasound evaluation demonstrated occlusion and possible variant anatomy of the right internal jugular vein, without safe access point for central line insertion. Therefore, the left neck and chest were prepped with chlorhexidine in a sterile fashion, and a sterile drape was applied covering the operative field. Maximum barrier sterile technique with sterile gowns and gloves were used for the procedure. A timeout was performed prior to the initiation of the procedure. After the overlying soft tissues were anesthetized, a small venotomy incision was created and a micropuncture kit was utilized to access the internal jugular vein. Real-time ultrasound guidance was utilized for vascular access including the acquisition of a permanent ultrasound image documenting patency of the accessed vessel. The microwire was utilized to measure appropriate catheter length. A guidewire was advanced  to the level of the IVC. Under fluoroscopic guidance, the venotomy was serially dilated, ultimately allowing  placement of a 24 cm temporary Trialysis catheter with tip ultimately terminating within the superior aspect of the right atrium. Final catheter positioning was confirmed and documented with a spot radiographic image. The catheter aspirates and flushes normally. The catheter was flushed with appropriate volume heparin dwells. The catheter exit site was secured with a 0 silk retention suture. A dressing was placed. The patient tolerated the procedure well without immediate post procedural complication. IMPRESSION: Successful placement of a left internal jugular approach 24 cm temporary dialysis catheter with tip terminating with in the superior aspect of the right atrium. The catheter is ready for immediate use. PLAN: This catheter may be converted to a tunneled dialysis catheter at a later date as indicated. Marliss Coots, MD [facility] Radiology Electronically Signed   By: Marliss Coots MD   On: 12/20/2020 11:11   IR US Guide Vasc Access Left  Result Date: 12/20/2020 INDICATION: 87 year old male with history of worsening azotemia. EXAM: NON-TUNNELED CENTRAL VENOUS HEMODIALYSIS CATHETER PLACEMENT WITH ULTRASOUND AND FLUOROSCOPIC GUIDANCE COMPARISON:  None. MEDICATIONS: None FLUOROSCOPY TIME:  minutes,   seconds (  mGy) COMPLICATIONS: None immediate. PROCEDURE: Informed written consent was obtained from the patient after a discussion of the risks, benefits, and alternatives to treatment. Questions regarding the procedure were encouraged and answered. Preprocedure ultrasound evaluation demonstrated occlusion and possible variant anatomy of the right internal jugular vein, without safe access point for central line insertion. Therefore, the left neck and chest were prepped with chlorhexidine in a sterile fashion, and a sterile drape was applied covering the operative field. Maximum barrier sterile technique with sterile gowns and gloves were used for the procedure. A  timeout was performed prior to the initiation of the procedure. After the overlying soft tissues were anesthetized, a small venotomy incision was created and a micropuncture kit was utilized to access the internal jugular vein. Real-time ultrasound guidance was utilized for vascular access including the acquisition of a permanent ultrasound image documenting patency of the accessed vessel. The microwire was utilized to measure appropriate catheter length. A guidewire was advanced to the level of the IVC. Under fluoroscopic guidance, the venotomy was serially dilated, ultimately allowing placement of a 24 cm temporary Trialysis catheter with tip ultimately terminating within the superior aspect of the right atrium. Final catheter positioning was confirmed and documented with a spot radiographic image. The catheter aspirates and flushes normally. The catheter was flushed with appropriate volume heparin dwells. The catheter exit site was secured with a 0 silk retention suture. A dressing was placed. The patient tolerated the procedure well without immediate post procedural complication. IMPRESSION: Successful placement of a left internal jugular approach 24 cm temporary dialysis catheter with tip terminating with in the superior aspect of the right atrium. The catheter is ready for immediate use. PLAN: This catheter may be converted to a tunneled dialysis catheter at a later date as indicated. Marliss Coots, MD [facility] Radiology Electronically Signed   By: Marliss Coots MD   On: 12/20/2020 11:11    Labs: BMET Recent Labs  Lab 12/15/20 0303 12/16/20 0320 12/17/20 9942 12/18/20 0112 12/19/20 0052 12/20/20 0716 12/21/20 0506  NA 141 143 140 137 137 139 140  K 3.4* 3.4* 3.5 3.9 3.6 3.5 3.0*  CL 104 103 103 99 99 105 103  CO2 22 23 23  21* 22 22 27  GLUCOSE 102* 97 101* 133* 98 107* 98  BUN 100* 96* 95* 96* 111* 108* 64*  CREATININE 7.08* 7.15* 7.09*  7.47* 7.94* 7.44* 4.61*  CALCIUM 8.0* 8.3* 8.3* 9.0 8.3* 7.9* 8.5*  PHOS  --  6.5* 5.9* 6.0* 6.9*  --  3.8   CBC Recent Labs  Lab 12/15/20 0303 12/17/20 0220 12/20/20 0112 12/21/20 0506  WBC 10.1 8.4 10.0 9.1  HGB 8.1* 8.8* 8.6* 7.9*  HCT 24.6* 27.4* 26.8* 24.4*  MCV 92.8 93.8 95.0 93.8  PLT 215 266 328 267    Medications:    . (feeding supplement) PROSource Plus  30 mL Oral BID BM  . aspirin  81 mg Oral QPC supper  . atorvastatin  20 mg Oral Daily  . calcium carbonate  1 tablet Oral TID WC  . Chlorhexidine Gluconate Cloth  6 each Topical Daily  . feeding supplement  1 Container Oral TID BM  . ferrous sulfate  325 mg Oral Q breakfast  . heparin injection (subcutaneous)  5,000 Units Subcutaneous Q8H  . heparin sodium (porcine)      . midodrine  5 mg Oral TID WC  . potassium chloride  20 mEq Oral BID   Elmarie Shiley, MD 12/21/2020, 7:33 AM

## 2020-12-21 NOTE — Plan of Care (Signed)

## 2020-12-21 NOTE — Progress Notes (Signed)
PROGRESS NOTE  Andrew [name]  FXT:[id number] DOB: 1932/07/04 DOA: 12/10/2020 PCP: [name], MD  Outpatient Specialists: Vascular surgery, Dr. Carlis Abbott Brief Narrative: Andrew [name] is an 87 y.o.malewith a history of thoracic aortic aneurysm status post stent graft repair, AAA, CABG in 2010, chronic HFpEF, BPH with urinary retention, CKDII,and remote atrial fibrillation not anticoagulated who presented to the ED 5/20 prompted by PCP for abnormal blood work which was drawn in the setting of cough that hadn't improved with augmentin. He also had been losing appetite and feeling unwell.  Labs were notable for mild leukocytosis, worsening anemia, renal failure. CT the chest, abdomen, and pelvis is notable for new left upper lobe infiltrate, unchanged stent graft repair of thoracic aortic aneurysm, and grossly stable infrarenal AAA. Patient was treated with rocephin, azithromycin, albuterol, and IV fluids in the ED. SBP nadir was in 70's shortly after admission. Creatinine was 8.71 worsening to 9.32 from previous of 1.09 in Aug 2021. Nephrology was consulted. Further goals of care discussions have continued. The patient and family now confirm their desire for trial of hemodialysis if there continues to be no renal recovery. Trialysis catheter was placed 5/30 with first dialysis session alter that day.   Assessment & Plan: Principal Problem:   AKI (acute kidney injury) ([place]) Active Problems:   Thoracic aortic aneurysm without rupture ([place])   Coronary artery disease   Urinary retention   AAA (abdominal aortic aneurysm) without rupture ([facility])   Essential hypertension   Normocytic anemia   Chronic diastolic CHF (congestive heart failure) ([place])   Community acquired pneumonia  Acute renal failure superimposed on CKD II; uremia; metabolic acidosis: Initial BUN 161, Cr 8.71 (44 &1.09 in Aug 2021) with serum bicarb of 17. Kidneys appear normal with no obstruction on CT in ED. Relatively bland  micro and proteinuria. Slight acidosis on labs 5/28, worsened on 5/28.  -Appreciate nephrology assistance. Trialysis catheter was placed 5/30, first dialysis 5/30. Having some hypotension afterward. Augment midodrine as below. Pt also feels "miserable" after dialysis. This may suggest he won't tolerate dialysis and may suggest we may ultimately pursue a comfort care path. - Continue foley catheter (last exchanged 5/28).  - Monitoring I/O, daily renal function panel.  - Avoiding nephrotoxins.    Hypokalemia:  - Supplemented today. Continue monitoring  Hypomagnesemia: Resolved - Continue monitoring intermittently  Hyperphosphatemia:  - Renal diet, calcium carbonate w/meals. Improving.  Acute hypoxic respiratory failure: Due to CAP, possible aspiration, possible renal failure-related volume overload.  - Continue supplemental oxygen to maintain SpO2 >90% with normal WOB. CXR 5/27 personally reviewed showing very mild interstitial prominence and bibasilar faint opacities consistent with atelectasis. This seems to be stable.   LUL CAP: Has completed 5 days of ceftriaxone, azithromycin. Remains afebrile without leukocytosis.  Hypotension: Was improving til dialysis. - Will give 500cc bolus, additional dose of midodrine this AM and increase later doses.   Urinary retention: Chronic, BPH.  - Continue foley with ongoing assessment of renal failure. Exchanged 12/18/2020.   CAD: History of PCI and CABG 2010. No anginal complaints currently.  -Continue ASA, statin  Chronic HFpEF: Echo 5/26 unchanged from prior with LVEF 60-65%, unevaluated diastolic function, no RWMAs. Normal RV, no severe valvular disease.  - Manage volume with dialysis  - Monitoring I/O, daily weights  Thoracic aortic aneurysm s/p repair: Stable  AAA: Appears stable on CT in ED -Plan to keep VVS appointment with repeat U/S on 6/21 with Dr. Carlis Abbott.   Iron deficiency anemia: No  acute bleeding noted. Hgb rebounding s/p  aranesp per nephrology. - Monitor intermittently.  - Daily iron supplement.  DVT prophylaxis: Heparin [place] Code Status: Full Family Communication: Wife at bedside this AM Disposition Plan:  Status is: Inpatient  Remains inpatient appropriate because:Persistent severe electrolyte disturbances  Dispo: The patient is from: Home              Anticipated d/c is to: TBD - CIR vs. SNF insurance denied authorization for CIR after peer-to-peer by myself 5/27, though that was prior to patient and family's desire to trial dialysis. He likely meets medical criteria for admission to CIR at this time.  I would plan to see how dialysis goes before deciding on reassessment of CIR candidacy.              Patient currently is not medically stable to d/c.   Difficult to place patient No  Consultants:   Nephrology  Palliative care  CIR  Procedures:   None  Antimicrobials:  Ceftriaxone, azithromycin   Subjective: Not eating, feels miserable and clearly feeling too fatigued to elaborate but is interactive. No focal pain but does have mild dyspnea which is stable.   Objective: Vitals:   12/21/20 0850 12/21/20 0944 12/21/20 1053 12/21/20 1134  BP: 93/61 (!) 77/53 (!) 84/59 (!) 103/59  Pulse: 85 74 70 74  Resp: _0 Temp:    98.5 F (36.9 C)  TempSrc:      SpO2: 92% 96% 94% 97%  Weight:      Height:        Intake/Output Summary (Last 24 hours) at 12/21/2020 1138 Last data filed at 12/21/2020 0303 Gross per 24 hour  Intake --  Output 0 ml  Net 0 ml   Filed Weights   12/19/20 0521 12/20/20 0500 12/21/20 0458  Weight: 59.9 kg 59.9 kg 59.9 kg   Gen: Very tired appearing elderly male in no acute distress Pulm: Nonlabored breathing supplemental oxygen. Clear. CV: Irreg w/PVCs. No murmur, rub, or gallop. No JVD, no pitting dependent edema. GI: Abdomen soft, non-tender, non-distended, with normoactive bowel sounds.  Ext: Warm, no deformities Skin: No new rashes, lesions or ulcers  on visualized skin. Neuro: Alert, oriented with diffuse severe weakness but no focal neurological deficits. Psych: Judgement and insight appear fair. Behavior is appropriate.    Data Reviewed: I have personally reviewed following labs and imaging studies  CBC: Recent Labs  Lab 12/15/20 0303 12/17/20 0220 12/20/20 0112 12/21/20 0506  WBC 10.1 8.4 10.0 9.1  HGB 8.1* 8.8* 8.6* 7.9*  HCT 24.6* 27.4* 26.8* 24.4*  MCV 92.8 93.8 95.0 93.8  PLT 215 266 328 257   Basic Metabolic Panel: Recent Labs  Lab 12/16/20 0320 12/17/20 0220 12/18/20 0112 12/19/20 0052 12/20/20 0716 12/21/20 0506  NA 143 140 137 137 139 140  K 3.4* 3.5 3.9 3.6 3.5 3.0*  CL 103 103 99 99 105 103  CO2 23 23 21* _1 GLUCOSE 97 101* 133* 98 107* 98  BUN 96* 95* 96* 111* 108* 64*  CREATININE 7.15* 7.09* 7.47* 7.94* 7.44* 4.61*  CALCIUM 8.3* 8.3* 9.0 8.3* 7.9* 8.5*  MG 1.5*  --  2.0  --   --   --   PHOS 6.5* 5.9* 6.0* 6.9*  --  3.8   GFR: Estimated Creatinine Clearance: 9.7 mL/min (A) (by C-G formula based on SCr of 4.61 mg/dL (H)). Liver Function Tests: Recent Labs  Lab 12/16/20 0320 12/17/20 0220 12/18/20  0112 12/19/20 0052 12/21/20 0506  ALBUMIN 2.3* 2.4* 2.6* 2.2* 2.0*   Cardiac Enzymes: No results for input(s): CKTOTAL, CKMB, CKMBINDEX, TROPONINI in the last 168 hours. BNP (last 3 results) Recent Labs    01/09/20 1443  PROBNP 1,018*   Urine analysis:    Component Value Date/Time   COLORURINE STRAW (A) 12/10/2020 1713   APPEARANCEUR CLEAR 12/10/2020 1713   LABSPEC 1.015 12/10/2020 1713   PHURINE 5.5 12/10/2020 1713   GLUCOSEU NEGATIVE 12/10/2020 1713   HGBUR MODERATE (A) 12/10/2020 1713   BILIRUBINUR NEGATIVE 12/10/2020 1713   KETONESUR NEGATIVE 12/10/2020 1713   PROTEINUR 30 (A) 12/10/2020 1713   UROBILINOGEN 0.2 06/09/2009 0918   NITRITE NEGATIVE 12/10/2020 1713   LEUKOCYTESUR NEGATIVE 12/10/2020 1713   No results found for this or any previous visit (from the past 240  hour(s)).    Radiology Studies: IR Fluoro Guide CV Line Left  Result Date: 12/20/2020 INDICATION: 87 year old male with history of worsening azotemia. EXAM: NON-TUNNELED CENTRAL VENOUS HEMODIALYSIS CATHETER PLACEMENT WITH ULTRASOUND AND FLUOROSCOPIC GUIDANCE COMPARISON:  None. MEDICATIONS: None FLUOROSCOPY TIME:  minutes,   seconds (  mGy) COMPLICATIONS: None immediate. PROCEDURE: Informed written consent was obtained from the patient after a discussion of the risks, benefits, and alternatives to treatment. Questions regarding the procedure were encouraged and answered. Preprocedure ultrasound evaluation demonstrated occlusion and possible variant anatomy of the right internal jugular vein, without safe access point for central line insertion. Therefore, the left neck and chest were prepped with chlorhexidine in a sterile fashion, and a sterile drape was applied covering the operative field. Maximum barrier sterile technique with sterile gowns and gloves were used for the procedure. A timeout was performed prior to the initiation of the procedure. After the overlying soft tissues were anesthetized, a small venotomy incision was created and a micropuncture kit was utilized to access the internal jugular vein. Real-time ultrasound guidance was utilized for vascular access including the acquisition of a permanent ultrasound image documenting patency of the accessed vessel. The microwire was utilized to measure appropriate catheter length. A guidewire was advanced to the level of the IVC. Under fluoroscopic guidance, the venotomy was serially dilated, ultimately allowing placement of a 24 cm temporary Trialysis catheter with tip ultimately terminating within the superior aspect of the right atrium. Final catheter positioning was confirmed and documented with a spot radiographic image. The catheter aspirates and flushes normally. The catheter was flushed with appropriate volume heparin dwells. The catheter exit site  was secured with a 0 silk retention suture. A dressing was placed. The patient tolerated the procedure well without immediate post procedural complication. IMPRESSION: Successful placement of a left internal jugular approach 24 cm temporary dialysis catheter with tip terminating with in the superior aspect of the right atrium. The catheter is ready for immediate use. PLAN: This catheter may be converted to a tunneled dialysis catheter at a later date as indicated. [name], MD [facility] Radiology Electronically Signed   By: [name] MD   On: 12/20/2020 11:11   IR US Guide Vasc Access Left  Result Date: 12/20/2020 INDICATION: 87 year old male with history of worsening azotemia. EXAM: NON-TUNNELED CENTRAL VENOUS HEMODIALYSIS CATHETER PLACEMENT WITH ULTRASOUND AND FLUOROSCOPIC GUIDANCE COMPARISON:  None. MEDICATIONS: None FLUOROSCOPY TIME:  minutes,   seconds (  mGy) COMPLICATIONS: None immediate. PROCEDURE: Informed written consent was obtained from the patient after a discussion of the risks, benefits, and alternatives to treatment. Questions regarding the procedure were encouraged and  answered. Preprocedure ultrasound evaluation demonstrated occlusion and possible variant anatomy of the right internal jugular vein, without safe access point for central line insertion. Therefore, the left neck and chest were prepped with chlorhexidine in a sterile fashion, and a sterile drape was applied covering the operative field. Maximum barrier sterile technique with sterile gowns and gloves were used for the procedure. A timeout was performed prior to the initiation of the procedure. After the overlying soft tissues were anesthetized, a small venotomy incision was created and a micropuncture kit was utilized to access the internal jugular vein. Real-time ultrasound guidance was utilized for vascular access including the acquisition of a permanent ultrasound image  documenting patency of the accessed vessel. The microwire was utilized to measure appropriate catheter length. A guidewire was advanced to the level of the IVC. Under fluoroscopic guidance, the venotomy was serially dilated, ultimately allowing placement of a 24 cm temporary Trialysis catheter with tip ultimately terminating within the superior aspect of the right atrium. Final catheter positioning was confirmed and documented with a spot radiographic image. The catheter aspirates and flushes normally. The catheter was flushed with appropriate volume heparin dwells. The catheter exit site was secured with a 0 silk retention suture. A dressing was placed. The patient tolerated the procedure well without immediate post procedural complication. IMPRESSION: Successful placement of a left internal jugular approach 24 cm temporary dialysis catheter with tip terminating with in the superior aspect of the right atrium. The catheter is ready for immediate use. PLAN: This catheter may be converted to a tunneled dialysis catheter at a later date as indicated. [name], MD [facility] Radiology Electronically Signed   By: [name] MD   On: 12/20/2020 11:11    Scheduled Meds: . (feeding supplement) PROSource Plus  30 mL Oral BID BM  . aspirin  81 mg Oral QPC supper  . atorvastatin  20 mg Oral Daily  . calcium carbonate  1 tablet Oral TID WC  . Chlorhexidine Gluconate Cloth  6 each Topical Daily  . feeding supplement  1 Container Oral TID BM  . ferrous sulfate  325 mg Oral Q breakfast  . heparin injection (subcutaneous)  5,000 Units Subcutaneous Q8H  . heparin sodium (porcine)      . midodrine  10 mg Oral TID WC  . polyethylene glycol  17 g Oral Daily  . potassium chloride  20 mEq Oral BID  . senna-docusate  1 tablet Oral BID   Continuous Infusions:    LOS: 11 days   Time spent: 35 minutes.  Patrecia Pour, MD [facility] [email or web address] 12/21/2020, 11:38 AM

## 2020-12-21 NOTE — Progress Notes (Signed)
Occupational Therapy Treatment Patient Details Name: Andrew [name] MRN: [id number] DOB: 1931-12-08 Today's Date: 12/21/2020    History of present illness 87 y.o. male presented 12/10/20 to the emergency department for evaluation of abnormal blood work. Acute renal failure with uremia;  CT the chest, abdomen, and pelvis is notable for pneumonia. Pt had temporary HD catheter placed 5/30 in preparation for HD. PMH- thoracic aortic aneurysm status post stent graft repair, CABG in 2010, AAA, chronic diastolic CHF, BPH, urinary retention, CKD II, and remote atrial fibrillation not anticoagulated, back surgery   OT comments  Pt seen with dtr and wife present for session, pt perseverating on  'dialysis took it out of me this AM'- per RN pt had dialysis over night with soft BP throughout day. on Arrival BP appropriate for participation and pt mostly agreeable, +orthistatic with transition to sitting, improved once returned to bed. See readings below. Pt requiring heavy max A for rolling and transition supine<>sitting EOB, and max-dep for supine scoot with return to supine. Tolerated <5 minutes sitting EOB with strong posterior lean requiring fluctuating levels of A from mod-max breifly able to sustain but then returned to posterior pushing. Majority of session with focus on education to pt and family present on recommendations, current level of performance and benefit of use of chair positioning and lift for OOB to chair as medically indicated for pt. Nursing made aware of pt status and end of session as well as +orthostatics.   Bed: 125/84 HR 69 Sitting: 92/62 HR 68 Return to supine: 131/68 HR 85   Follow Up Recommendations  SNF;Supervision/Assistance - 24 hour    Equipment Recommendations    TBD   Recommendations for Other Services      Precautions / Restrictions Precautions Precautions: Fall Precaution Comments: orthostatic, HD port       Mobility Bed Mobility Overal bed mobility: Needs  Assistance Bed Mobility: Sidelying to Sit;Rolling;Sit to Supine Rolling: Max assist Sidelying to sit: Max assist;HOB elevated Supine to sit: Max assist Sit to supine: Max assist   General bed mobility comments: A for initiating and executing both legs off of bed, as well as transition to sitting upright wtih heavy support of trunk initially with strong posterior lean able to improve over time    Transfers                      Balance Overall balance assessment: Needs assistance Sitting-balance support: Bilateral upper extremity supported;Feet supported Sitting balance-Leahy Scale: Poor Sitting balance - Comments: fluctuating from requiring min-max A for countering posterior lean with onset of fatigue and noted drop in BP with activity Postural control: Posterior lean Standing balance support: Bilateral upper extremity supported                               ADL either performed or assessed with clinical judgement   ADL                                               Vision       Perception     Praxis      Cognition Arousal/Alertness: Awake/alert Behavior During Therapy: Flat affect Overall Cognitive Status: Impaired/Different from baseline Area of Impairment: Attention;Following commands;Problem solving;Safety/judgement  Memory: Decreased short-term memory Following Commands: Follows one step commands with increased time;Follows one step commands inconsistently Safety/Judgement: Decreased awareness of deficits   Problem Solving: Slow processing;Decreased initiation;Requires verbal cues;Requires tactile cues;Difficulty sequencing General Comments: minimal verbalizations throughout session requiring near constant cuing from therapist, reports increased fear of falling with all attempts at bed level transfers/unsupported sitting EOB        Exercises     Shoulder Instructions       General Comments  pts wife and daughter present for session; spent extensive amount of time on education on tolerance, and monitoring BP throughout session. see assesment, pt +orthostatics    Pertinent Vitals/ Pain       Pain Assessment: No/denies pain  Home Living                                          Prior Functioning/Environment              Frequency  Min 2X/week        Progress Toward Goals  OT Goals(current goals can now be found in the care plan section)  Progress towards OT goals: Progressing toward goals  Acute Rehab OT Goals Patient Stated Goal: none stated this date OT Goal Formulation: With patient Time For Goal Achievement: 12/28/20 Potential to Achieve Goals: [place] Discharge plan needs to be updated;Frequency remains appropriate    Co-evaluation                 AM-PAC OT "6 Clicks" Daily Activity     Outcome Measure   Help from another person eating meals?: A Little Help from another person taking care of personal grooming?: A Little Help from another person toileting, which includes using toliet, bedpan, or urinal?: Total Help from another person bathing (including washing, rinsing, drying)?: A Lot Help from another person to put on and taking off regular upper body clothing?: A Little Help from another person to put on and taking off regular lower body clothing?: A Lot 6 Click Score: 14    End of Session Equipment Utilized During Treatment: Oxygen  OT Visit Diagnosis: Unsteadiness on feet (R26.81);Other abnormalities of gait and mobility (R26.89);Muscle weakness (generalized) (M62.81);History of falling (Z91.81)   Activity Tolerance Patient limited by lethargy;Patient limited by fatigue;Other (comment)   Patient Left in bed;with call bell/phone within reach;with family/visitor present   Nurse Communication Mobility status;Other (comment) (alerted RN of Orthostatic occurance with transition to sitting at EOB)    Time:  8616-8372 OT Time Calculation (min): 31 min  Charges: OT General Charges $OT Visit: 1 Visit OT Treatments $Therapeutic Activity: 23-37 mins  Bandon Sherwin OTR/L acute rehab services Office: [phone]  12/21/2020, 3:07 PM

## 2020-12-21 NOTE — Progress Notes (Signed)
   12/21/20 0944  Assess: MEWS Score  BP (!) 77/53  Pulse Rate 74  ECG Heart Rate 68  Resp 12  SpO2 96 %  O2 Device Nasal Cannula  Assess: MEWS Score  MEWS Temp 0  MEWS Systolic 2  MEWS Pulse 0  MEWS RR 1  MEWS LOC 0  MEWS Score 3  MEWS Score Color Yellow  Assess: if the MEWS score is Yellow or Red  Were vital signs taken at a resting state? Yes  Focused Assessment No change from prior assessment  Early Detection of Sepsis Score *See Row Information* Low  MEWS guidelines implemented *See Row Information* Yes  Take Vital Signs  Increase Vital Sign Frequency  Yellow: Q 2hr X 2 then Q 4hr X 2, if remains yellow, continue Q 4hrs  Escalate  MEWS: Escalate Yellow: discuss with charge nurse/RN and consider discussing with provider and RRT  Notify: Charge Nurse/RN  Name of Charge Nurse/RN Notified Deeann Cree  Date Charge Nurse/RN Notified 12/21/20  Time Charge Nurse/RN Notified 1610  Notify: Provider  Provider Name/Title Mathews Robinsons MD  Date Provider Notified 12/21/20  Time Provider Notified 0800  Notification Type Page  Notification Reason Other (Comment) (BP low)  Provider response See new orders  Date of Provider Response 12/21/20  Time of Provider Response [phone]

## 2020-12-22 DIAGNOSIS — I5032 Chronic diastolic (congestive) heart failure: Secondary | ICD-10-CM | POA: Diagnosis not present

## 2020-12-22 DIAGNOSIS — N179 Acute kidney failure, unspecified: Secondary | ICD-10-CM | POA: Diagnosis not present

## 2020-12-22 DIAGNOSIS — J9601 Acute respiratory failure with hypoxia: Secondary | ICD-10-CM

## 2020-12-22 LAB — RENAL FUNCTION PANEL
Albumin: 2.1 g/dL — ABNORMAL LOW (ref 3.5–5.0)
Anion gap: 13 (ref 5–15)
BUN: 74 mg/dL — ABNORMAL HIGH (ref 8–23)
CO2: 25 mmol/L (ref 22–32)
Calcium: 8.5 mg/dL — ABNORMAL LOW (ref 8.9–10.3)
Chloride: 101 mmol/L (ref 98–111)
Creatinine, Ser: 5.39 mg/dL — ABNORMAL HIGH (ref 0.61–1.24)
GFR, Estimated: 10 mL/min — ABNORMAL LOW (ref >60)
Glucose, Bld: 95 mg/dL (ref 70–99)
Phosphorus: 4.5 mg/dL (ref 2.5–4.6)
Potassium: 3.3 mmol/L — ABNORMAL LOW (ref 3.5–5.1)
Sodium: 139 mmol/L (ref 135–145)

## 2020-12-22 MED ORDER — POTASSIUM CHLORIDE CRYS ER 20 MEQ PO TBCR
20.0000 meq | EXTENDED_RELEASE_TABLET | Freq: Two times a day (BID) | ORAL | Status: AC
  Administered 2020-12-22 (×2): 20 meq via ORAL
  Filled 2020-12-22 (×2): qty 1

## 2020-12-22 MED ORDER — SORBITOL 70 % SOLN
30.0000 mL | Freq: Once | Status: AC
  Administered 2020-12-22: 30 mL via ORAL
  Filled 2020-12-22: qty 30

## 2020-12-22 NOTE — Plan of Care (Signed)

## 2020-12-22 NOTE — Progress Notes (Signed)
PROGRESS NOTE    Andrew [name]  NOM:[id number] DOB: 1931/11/26 DOA: 12/10/2020 PCP: [name], MD    Brief Narrative:  87 y.o.malewith a history of thoracic aortic aneurysm status post stent graft repair, AAA, CABG in 2010, chronic HFpEF, BPH with urinary retention, CKDII,and remote atrial fibrillation not anticoagulated who presented to the ED 5/20 prompted by PCP for abnormal blood work which was drawn in the setting of cough that hadn't improved with augmentin. He also had been losing appetite and feeling unwell.  Labs were notable for mild leukocytosis, worsening anemia, renal failure. CT the chest, abdomen, and pelvis is notable for new left upper lobe infiltrate, unchanged stent graft repair of thoracic aortic aneurysm, and grossly stable infrarenal AAA. Patient was treated with rocephin, azithromycin, albuterol, and IV fluids in the ED. SBP nadir was in 70's shortly after admission. Creatinine was 8.71 worsening to 9.32 from previous of 1.09 in Aug 2021. Nephrology was consulted. Further goals of care discussions have continued. The patient and family now confirm their desire for trial of hemodialysis if there continues to be no renal recovery. Trialysis catheter was placed 5/30 with first dialysis session alter that day.   Assessment & Plan:   Principal Problem:   AKI (acute kidney injury) ([place]) Active Problems:   Thoracic aortic aneurysm without rupture ([place])   Coronary artery disease   Urinary retention   AAA (abdominal aortic aneurysm) without rupture (HCC)   Essential hypertension   Normocytic anemia   Chronic diastolic CHF (congestive heart failure) ([place])   Community acquired pneumonia  Acute renal failure superimposed on CKD II; uremia; metabolic acidosis: Initial BUN 161, Cr 8.71 (44 &1.09 in Aug 2021) with serum bicarb of 17. Kidneys appear normal with no obstruction on CT in ED.   -Nephrology following. Trialysis catheter was placed 5/30, first dialysis  5/30. Having some hypotension afterward. - Pt's BP remains soft. On midodrine. - Continue foley catheter (last exchanged 5/28).  - Monitoring I/O, daily renal function panel.  - Continue to avoid nephrotoxins.    Hypokalemia:  - Supplemented today. Continue monitoring  Hypomagnesemia:  -Corrected  Hyperphosphatemia:  - Cont with renal diet, calcium carbonate w/meals. -Normalized  Acute hypoxic respiratory failure: Due to CAP, possible aspiration, possible renal failure-related volume overload.  - Continue supplemental oxygen to maintain SpO2 >90% with normal WOB. CXR 5/27 personally reviewed showing very mild interstitial prominence and bibasilar faint opacities consistent with atelectasis.  -Noted to be stable  LUL CAP: Has completed 5 days of ceftriaxone, azithromycin. Remains afebrile without leukocytosis. -Still with bouts of occasional coughing  Hypotension: Was improving til dialysis. - Now on midodrine -Recently received gentle hydration for hypotension  Urinary retention: Chronic, BPH.  - Continue foley with ongoing assessment of renal failure.  --Catheter was exchanged 12/18/2020.   CAD: History of PCI and CABG 2010. No anginal complaints currently.  -Continue with ASA, statin  Chronic HFpEF: Echo 5/26 unchanged from prior with LVEF 60-65%, unevaluated diastolic function, no RWMAs. Normal RV, no severe valvular disease.  - Recently attempted HD, however pt did not seem to tolerate given hypotension  - Monitoring I/O, daily weights  Thoracic aortic aneurysm s/p repair: Stable thus far  AAA: Appears stable on CT in ED -Plan to keep VVS appointment with repeat U/S on 6/21 with Dr. Carlis Abbott.   Iron deficiency anemia: No acute bleeding noted. Hgb rebounding s/p aranesppernephrology. - Hemodynamically - Daily iron supplement.  DVT prophylaxis: heparin subq Code Status: Full Family Communication: Pt  in room, wife is currently at bedside  Status is:  Inpatient  Remains inpatient appropriate because:Inpatient level of care appropriate due to severity of illness   Dispo: The patient is from: Home              Anticipated d/c is to: Unclear at this time              Patient currently is not medically stable to d/c.   Difficult to place patient No       Consultants:   Nephrology  Palliative Care  Procedures:     Antimicrobials: Anti-infectives (From admission, onward)   Start     Dose/Rate Route Frequency Ordered Stop   12/11/20 2000  cefTRIAXone (ROCEPHIN) 2 g in sodium chloride 0.9 % 100 mL IVPB        2 g 200 mL/hr over 30 Minutes Intravenous Every 24 hours 12/10/20 2308 12/14/20 2321   12/11/20 2000  azithromycin (ZITHROMAX) 500 mg in sodium chloride 0.9 % 250 mL IVPB        500 mg 250 mL/hr over 60 Minutes Intravenous Every 24 hours 12/10/20 2308 12/14/20 2133   12/10/20 1945  cefTRIAXone (ROCEPHIN) 1 g in sodium chloride 0.9 % 100 mL IVPB        1 g 200 mL/hr over 30 Minutes Intravenous  Once 12/10/20 1940 12/10/20 2037   12/10/20 1945  azithromycin (ZITHROMAX) 500 mg in sodium chloride 0.9 % 250 mL IVPB        500 mg 250 mL/hr over 60 Minutes Intravenous  Once 12/10/20 1940 12/10/20 2142       Subjective: Feeling weak this AM  Objective: Vitals:   12/22/20 0531 12/22/20 0929 12/22/20 0938 12/22/20 1053  BP:  (!) 81/52 (!) 87/63 (!) 81/50  Pulse:  (!) 50  77  Resp:  14  15  Temp:  98.7 F (37.1 C)    TempSrc:  Oral    SpO2:  98%    Weight: 59.9 kg     Height:        Intake/Output Summary (Last 24 hours) at 12/22/2020 1118 Last data filed at 12/22/2020 0945 Gross per 24 hour  Intake --  Output 200 ml  Net -200 ml   Filed Weights   12/20/20 0500 12/21/20 0458 12/22/20 0531  Weight: 59.9 kg 59.9 kg 59.9 kg    Examination: General exam: Awake, laying in bed, in nad Respiratory system: Normal respiratory effort, no wheezing Cardiovascular system: regular rate, s1, s2 Gastrointestinal system:  Soft, nondistended, positive BS Central nervous system: CN2-12 grossly intact, strength intact Extremities: Perfused, no clubbing Skin: Normal skin turgor, no notable skin lesions seen Psychiatry: Mood normal // no visual hallucinations   Data Reviewed: I have personally reviewed following labs and imaging studies  CBC: Recent Labs  Lab 12/17/20 0220 12/20/20 0112 12/21/20 0506  WBC 8.4 10.0 9.1  HGB 8.8* 8.6* 7.9*  HCT 27.4* 26.8* 24.4*  MCV 93.8 95.0 93.8  PLT 266 328 263   Basic Metabolic Panel: Recent Labs  Lab 12/16/20 0320 12/17/20 0220 12/18/20 0112 12/19/20 0052 12/20/20 0716 12/21/20 0506 12/22/20 0412  NA 143 140 137 137 139 140 139  K 3.4* 3.5 3.9 3.6 3.5 3.0* 3.3*  CL 103 103 99 99 105 103 101  CO2 23 23 21* 22 22 27 25   GLUCOSE 97 101* 133* 98 107* 98 95  BUN 96* 95* 96* 111* 108* 64* 74*  CREATININE 7.15* 7.09* 7.47* 7.94* 7.44* 4.61*  5.39*  CALCIUM 8.3* 8.3* 9.0 8.3* 7.9* 8.5* 8.5*  MG 1.5*  --  2.0  --   --   --   --   PHOS 6.5* 5.9* 6.0* 6.9*  --  3.8 4.5   GFR: Estimated Creatinine Clearance: 8.3 mL/min (A) (by C-G formula based on SCr of 5.39 mg/dL (H)). Liver Function Tests: Recent Labs  Lab 12/17/20 0220 12/18/20 0112 12/19/20 0052 12/21/20 0506 12/22/20 0412  ALBUMIN 2.4* 2.6* 2.2* 2.0* 2.1*   No results for input(s): LIPASE, AMYLASE in the last 168 hours. No results for input(s): AMMONIA in the last 168 hours. Coagulation Profile: No results for input(s): INR, PROTIME in the last 168 hours. Cardiac Enzymes: No results for input(s): CKTOTAL, CKMB, CKMBINDEX, TROPONINI in the last 168 hours. BNP (last 3 results) Recent Labs    01/09/20 1443  PROBNP 1,018*   HbA1C: No results for input(s): HGBA1C in the last 72 hours. CBG: Recent Labs  Lab 12/19/20 1926  GLUCAP 162*   Lipid Profile: No results for input(s): CHOL, HDL, LDLCALC, TRIG, CHOLHDL, LDLDIRECT in the last 72 hours. Thyroid Function Tests: No results for input(s):  TSH, T4TOTAL, FREET4, T3FREE, THYROIDAB in the last 72 hours. Anemia Panel: No results for input(s): VITAMINB12, FOLATE, FERRITIN, TIBC, IRON, RETICCTPCT in the last 72 hours. Sepsis Labs: No results for input(s): PROCALCITON, LATICACIDVEN in the last 168 hours.  No results found for this or any previous visit (from the past 240 hour(s)).   Radiology Studies: No results found.  Scheduled Meds: . (feeding supplement) PROSource Plus  30 mL Oral BID BM  . aspirin  81 mg Oral QPC supper  . atorvastatin  20 mg Oral Daily  . Chlorhexidine Gluconate Cloth  6 each Topical Daily  . feeding supplement  1 Container Oral TID BM  . ferrous sulfate  325 mg Oral Q breakfast  . heparin injection (subcutaneous)  5,000 Units Subcutaneous Q8H  . midodrine  10 mg Oral TID WC  . polyethylene glycol  17 g Oral Daily  . potassium chloride  20 mEq Oral BID  . senna-docusate  1 tablet Oral BID   Continuous Infusions:   LOS: 12 days   Marylu Lund, MD [facility] Pager On Amion  If 7PM-7AM, please contact night-coverage 12/22/2020, 11:18 AM

## 2020-12-22 NOTE — Progress Notes (Signed)
Patient ID: Andrew [name], male   DOB: 07/16/1932, 87 y.o.   MRN: [id number] [facility] Progress Note   Assessment/ Plan:   1. Acute kidney Injury: Nonoliguric with poorly charted urine output-etiology suspected to be in part from volume depletion/ischemic ATN and obstruction with some retention seen earlier.  There is at least 200 cc of clear pale yellow urine in the bag (unknown when this was last emptied) .  He had hemodialysis 2 days ago after placement of a temporary left IJ dialysis catheter and this morning's labs show a higher creatinine/BUN compared to yesterday but without any acute dialysis needs-I will continue to monitor this prospectively and make decisions regarding need for dialysis anticipating renal recovery to ensue. 2.  Anemia: Secondary to iron deficiency/acute illness.  On oral iron and status post ESA.  He had some bleeding/oozing from dialysis catheter insertion site that may have been compounded by azotemia/platelet dysfunction. 3.  Acute hypoxic respiratory failure: Associated with community-acquired pneumonia for which he has completed his course of azithromycin/ceftriaxone and remains afebrile/without leukocytosis.  On supplemental oxygenation via nasal cannula. 4.  Hyperphosphatemia: Continue renal diet and discontinue calcium carbonate at this time. 5.  Hypotension: On scheduled midodrine to allow for improved blood pressure/renal perfusion and will follow blood pressure trend closely to decide on need to wean this off. 6.  Hypokalemia: I will replace potassium via oral route.  Subjective:   Reports to be feeling fair and concerned that he has not had a bowel movement in the last 3 days-I ordered sorbitol to be administered today.   Objective:   BP 121/69 (BP Location: Right Arm)   Pulse 66   Temp 98.6 F (37 C) (Oral)   Resp 14   Ht _0  (1.727 m)   Wt 59.9 kg   SpO2 93%   BMI 20.08 kg/m  No intake or output data in the 24 hours ending  12/22/20 0820 Weight change: 0 kg  Physical Exam: Gen: Appears much more alert/animated than I have seen in the last 3 days.  Wife at bedside CVS: Pulse regular rhythm, normal rate, S1 and S2 normal Resp: Coarse/transmitted breath sounds bilaterally without rales/rhonchi Abd: Soft, flat, nontender, bowel sounds are normal Ext: No palpable lower extremity edema  Imaging: IR Fluoro Guide CV Line Left  Result Date: 12/20/2020 INDICATION: 87 year old male with history of worsening azotemia. EXAM: NON-TUNNELED CENTRAL VENOUS HEMODIALYSIS CATHETER PLACEMENT WITH ULTRASOUND AND FLUOROSCOPIC GUIDANCE COMPARISON:  None. MEDICATIONS: None FLUOROSCOPY TIME:  minutes,   seconds (  mGy) COMPLICATIONS: None immediate. PROCEDURE: Informed written consent was obtained from the patient after a discussion of the risks, benefits, and alternatives to treatment. Questions regarding the procedure were encouraged and answered. Preprocedure ultrasound evaluation demonstrated occlusion and possible variant anatomy of the right internal jugular vein, without safe access point for central line insertion. Therefore, the left neck and chest were prepped with chlorhexidine in a sterile fashion, and a sterile drape was applied covering the operative field. Maximum barrier sterile technique with sterile gowns and gloves were used for the procedure. A timeout was performed prior to the initiation of the procedure. After the overlying soft tissues were anesthetized, a small venotomy incision was created and a micropuncture kit was utilized to access the internal jugular vein. Real-time ultrasound guidance was utilized for vascular access including the acquisition of a permanent ultrasound image documenting patency of the accessed vessel. The microwire was utilized to measure appropriate catheter length. A guidewire was advanced to  the level of the IVC. Under fluoroscopic guidance, the venotomy was serially dilated, ultimately allowing  placement of a 24 cm temporary Trialysis catheter with tip ultimately terminating within the superior aspect of the right atrium. Final catheter positioning was confirmed and documented with a spot radiographic image. The catheter aspirates and flushes normally. The catheter was flushed with appropriate volume heparin dwells. The catheter exit site was secured with a 0 silk retention suture. A dressing was placed. The patient tolerated the procedure well without immediate post procedural complication. IMPRESSION: Successful placement of a left internal jugular approach 24 cm temporary dialysis catheter with tip terminating with in the superior aspect of the right atrium. The catheter is ready for immediate use. PLAN: This catheter may be converted to a tunneled dialysis catheter at a later date as indicated. [name], MD [facility] Radiology Electronically Signed   By: [name] MD   On: 12/20/2020 11:11   IR US Guide Vasc Access Left  Result Date: 12/20/2020 INDICATION: 86 year old male with history of worsening azotemia. EXAM: NON-TUNNELED CENTRAL VENOUS HEMODIALYSIS CATHETER PLACEMENT WITH ULTRASOUND AND FLUOROSCOPIC GUIDANCE COMPARISON:  None. MEDICATIONS: None FLUOROSCOPY TIME:  minutes,   seconds (  mGy) COMPLICATIONS: None immediate. PROCEDURE: Informed written consent was obtained from the patient after a discussion of the risks, benefits, and alternatives to treatment. Questions regarding the procedure were encouraged and answered. Preprocedure ultrasound evaluation demonstrated occlusion and possible variant anatomy of the right internal jugular vein, without safe access point for central line insertion. Therefore, the left neck and chest were prepped with chlorhexidine in a sterile fashion, and a sterile drape was applied covering the operative field. Maximum barrier sterile technique with sterile gowns and gloves were used for the procedure. A  timeout was performed prior to the initiation of the procedure. After the overlying soft tissues were anesthetized, a small venotomy incision was created and a micropuncture kit was utilized to access the internal jugular vein. Real-time ultrasound guidance was utilized for vascular access including the acquisition of a permanent ultrasound image documenting patency of the accessed vessel. The microwire was utilized to measure appropriate catheter length. A guidewire was advanced to the level of the IVC. Under fluoroscopic guidance, the venotomy was serially dilated, ultimately allowing placement of a 24 cm temporary Trialysis catheter with tip ultimately terminating within the superior aspect of the right atrium. Final catheter positioning was confirmed and documented with a spot radiographic image. The catheter aspirates and flushes normally. The catheter was flushed with appropriate volume heparin dwells. The catheter exit site was secured with a 0 silk retention suture. A dressing was placed. The patient tolerated the procedure well without immediate post procedural complication. IMPRESSION: Successful placement of a left internal jugular approach 24 cm temporary dialysis catheter with tip terminating with in the superior aspect of the right atrium. The catheter is ready for immediate use. PLAN: This catheter may be converted to a tunneled dialysis catheter at a later date as indicated. [name], MD [facility] Radiology Electronically Signed   By: [name] MD   On: 12/20/2020 11:11    Labs: BMET Recent Labs  Lab 12/16/20 0320 12/17/20 1287 12/18/20 0112 12/19/20 8676 12/20/20 0716 12/21/20 0506 12/22/20 0412  NA 143 140 137 137 139 140 139  K 3.4* 3.5 3.9 3.6 3.5 3.0* 3.3*  CL 103 103 99 99 105 103 101  CO2 23 23 21* _0 GLUCOSE  97 101* 133* 98 107* 98 95  BUN 96* 95* 96* 111* 108* 64* 74*  CREATININE 7.15* 7.09* 7.47* 7.94*  7.44* 4.61* 5.39*  CALCIUM 8.3* 8.3* 9.0 8.3* 7.9* 8.5* 8.5*  PHOS 6.5* 5.9* 6.0* 6.9*  --  3.8 4.5   CBC Recent Labs  Lab 12/17/20 0220 12/20/20 0112 12/21/20 0506  WBC 8.4 10.0 9.1  HGB 8.8* 8.6* 7.9*  HCT 27.4* 26.8* 24.4*  MCV 93.8 95.0 93.8  PLT 266 328 267    Medications:    . (feeding supplement) PROSource Plus  30 mL Oral BID BM  . aspirin  81 mg Oral QPC supper  . atorvastatin  20 mg Oral Daily  . calcium carbonate  1 tablet Oral TID WC  . Chlorhexidine Gluconate Cloth  6 each Topical Daily  . feeding supplement  1 Container Oral TID BM  . ferrous sulfate  325 mg Oral Q breakfast  . heparin injection (subcutaneous)  5,000 Units Subcutaneous Q8H  . midodrine  10 mg Oral TID WC  . polyethylene glycol  17 g Oral Daily  . senna-docusate  1 tablet Oral BID   Elmarie Shiley, MD 12/22/2020, 8:20 AM

## 2020-12-22 NOTE — Progress Notes (Signed)
Physical Therapy Treatment Patient Details Name: Andrew [name] MRN: [id number] DOB: 03/07/1932 Today's Date: 12/22/2020    History of Present Illness 87 y.o. male presented 12/10/20 to the emergency department for evaluation of abnormal blood work. Acute renal failure with uremia;  CT the chest, abdomen, and pelvis is notable for pneumonia. Pt had temporary HD catheter placed 5/30 in preparation for HD. PMH- thoracic aortic aneurysm status post stent graft repair, CABG in 2010, AAA, chronic diastolic CHF, BPH, urinary retention, CKD II, and remote atrial fibrillation not anticoagulated, back surgery    PT Comments    Pt admitted with above diagnosis. Pt was able to stand to [facility] with less assist than in previous sessions. Pt moved to 3n1 and was able to have BM.  Stood incr time to be cleaned as well with several stands and each one with less assist needed.  Pt progressing.  Pt currently with functional limitations due to balance and endurance deficits. Pt will benefit from skilled PT to increase their independence and safety with mobility to allow discharge to the venue listed below.     Follow Up Recommendations  SNF     Equipment Recommendations  Wheelchair (measurements PT);Wheelchair cushion (measurements PT);Hospital bed    Recommendations for Other Services OT consult     Precautions / Restrictions Precautions Precautions: Fall Precaution Comments: HD port Restrictions Weight Bearing Restrictions: No    Mobility  Bed Mobility Overal bed mobility: Needs Assistance Bed Mobility: Sidelying to Sit;Rolling;Sit to Supine Rolling: Max assist Sidelying to sit: Max assist;HOB elevated Supine to sit: Max assist     General bed mobility comments: A for initiating and executing both legs off of bed, as well as transition to sitting upright wtih heavy support of trunk initially with strong posterior lean able to improve over time    Transfers Overall transfer level: Needs  assistance   Transfers: Sit to/from Stand Sit to Stand: +2 physical assistance;Mod assist;Min assist;From elevated surface Stand pivot transfers: Total assist (in stedy)       General transfer comment: min to mod assist with use of bed pad to bring hips up at times but at times pt helping more. Pt able to fully stand today. Needs tactile cues and assist for full upright standing and cannot stand for long periods of time.  Stood to [employer] x 4 x as he was moved to 3N1 and then to recliner.  Used Stedy for bed to chair  Ambulation/Gait                 Marine scientist Rankin (Stroke Patients Only)       Balance Overall balance assessment: Needs assistance Sitting-balance support: Bilateral upper extremity supported;Feet supported Sitting balance-Leahy Scale: Poor Sitting balance - Comments: fluctuating from requiring min-mod A for countering posterior lean with onset of fatigue Postural control: Posterior lean Standing balance support: Bilateral upper extremity supported Standing balance-Leahy Scale: Poor Standing balance comment: Stood with flexed posture but was able to stand to get buttock pads behind pt with light min assist of 2 for standing once up.                            Cognition Arousal/Alertness: Awake/alert Behavior During Therapy: Flat affect Overall Cognitive Status: Impaired/Different from baseline Area of Impairment: Attention;Following commands;Problem solving;Safety/judgement  Memory: Decreased short-term memory Following Commands: Follows one step commands with increased time;Follows one step commands inconsistently Safety/Judgement: Decreased awareness of deficits   Problem Solving: Slow processing;Decreased initiation;Requires verbal cues;Requires tactile cues;Difficulty sequencing General Comments: Pt interactive with therapist. States he needs to use bathroom.  Obtained 3n1      Exercises General Exercises - Lower Extremity Long Arc Quad: AROM;Both;5 reps;Seated (emphasizing ankle DF and hold x 3 seconds) Hip Flexion/Marching: AROM;Both;5 reps;Seated    General Comments General comments (skin integrity, edema, etc.): 81 bpm, 95% 2L, 88% on RA, 16, 87/63 initial, 93/55 after sitting      Pertinent Vitals/Pain Pain Assessment: No/denies pain    Home Living                      Prior Function            PT Goals (current goals can now be found in the care plan section) Progress towards PT goals: Progressing toward goals    Frequency    Min 2X/week      PT Plan Current plan remains appropriate    Co-evaluation              AM-PAC PT "6 Clicks" Mobility   Outcome Measure  Help needed turning from your back to your side while in a flat bed without using bedrails?: A Lot Help needed moving from lying on your back to sitting on the side of a flat bed without using bedrails?: A Lot Help needed moving to and from a bed to a chair (including a wheelchair)?: A Lot Help needed standing up from a chair using your arms (e.g., wheelchair or bedside chair)?: A Lot Help needed to walk in hospital room?: Total Help needed climbing 3-5 steps with a railing? : Total 6 Click Score: 10    End of Session Equipment Utilized During Treatment: Gait belt;Oxygen Activity Tolerance: Patient limited by fatigue Patient left: with call bell/phone within reach;with family/visitor present;in chair;with chair alarm set Nurse Communication: Mobility status;Need for lift equipment Charlaine Dalton) PT Visit Diagnosis: Muscle weakness (generalized) (M62.81);Difficulty in walking, not elsewhere classified (R26.2)     Time: 1000-1035 PT Time Calculation (min) (ACUTE ONLY): 35 min  Charges:  $Therapeutic Exercise: 8-22 mins $Therapeutic Activity: 8-22 mins                     Vung Kush M,PT Acute Rehab Services [phone]  (pager)   Alvira Philips 12/22/2020, 1:11 PM

## 2020-12-22 NOTE — TOC Progression Note (Signed)
Transition of Care [facility]) - Progression Note    Patient Details  Name: Andrew [name] MRN: [id number] Date of Birth: 04/16/1932  [facility]) CM/SW Contact  Joanne Chars, LCSW Phone Number: 12/22/2020, 10:49 AM  Clinical Narrative:   CSW spoke with pt wife, they are moving forward with applying to [facility] and were told pt could potentially be admitted to their SNF.  She does have back up plan for [facility] if [facility] does not work out.      Expected Discharge Plan: [place] Barriers to Discharge: Insurance Authorization,Continued Medical Work up  Expected Discharge Plan and Services Expected Discharge Plan: [place] Choice: [place] arrangements for the past 2 months: Single Family Home                                       Social Determinants of Health (SDOH) Interventions    Readmission Risk Interventions No flowsheet data found.

## 2020-12-22 NOTE — Progress Notes (Signed)
BP this am 81/52, MD made aware. BP asymptomatic. MD at bedside to assess. No new orders just continue to monitor.

## 2020-12-23 DIAGNOSIS — J9601 Acute respiratory failure with hypoxia: Secondary | ICD-10-CM | POA: Diagnosis not present

## 2020-12-23 DIAGNOSIS — N179 Acute kidney failure, unspecified: Secondary | ICD-10-CM | POA: Diagnosis not present

## 2020-12-23 LAB — RENAL FUNCTION PANEL
Albumin: 2.2 g/dL — ABNORMAL LOW (ref 3.5–5.0)
Anion gap: 11 (ref 5–15)
BUN: 82 mg/dL — ABNORMAL HIGH (ref 8–23)
CO2: 24 mmol/L (ref 22–32)
Calcium: 8.4 mg/dL — ABNORMAL LOW (ref 8.9–10.3)
Chloride: 106 mmol/L (ref 98–111)
Creatinine, Ser: 5.8 mg/dL — ABNORMAL HIGH (ref 0.61–1.24)
GFR, Estimated: 9 mL/min — ABNORMAL LOW (ref >60)
Glucose, Bld: 91 mg/dL (ref 70–99)
Phosphorus: 5.1 mg/dL — ABNORMAL HIGH (ref 2.5–4.6)
Potassium: 3.8 mmol/L (ref 3.5–5.1)
Sodium: 141 mmol/L (ref 135–145)

## 2020-12-23 LAB — CBC
HCT: 26.3 % — ABNORMAL LOW (ref 39.0–52.0)
Hemoglobin: 8.1 g/dL — ABNORMAL LOW (ref 13.0–17.0)
MCH: 30 pg (ref 26.0–34.0)
MCHC: 30.8 g/dL (ref 30.0–36.0)
MCV: 97.4 fL (ref 80.0–100.0)
Platelets: 323 10*3/uL (ref 150–400)
RBC: 2.7 MIL/uL — ABNORMAL LOW (ref 4.22–5.81)
RDW: 14.4 % (ref 11.5–15.5)
WBC: 8.5 10*3/uL (ref 4.0–10.5)
nRBC: 0.4 % — ABNORMAL HIGH (ref 0.0–0.2)

## 2020-12-23 LAB — HEPATITIS B SURFACE ANTIGEN: Hepatitis B Surface Ag: NONREACTIVE

## 2020-12-23 MED ORDER — HEPARIN SODIUM (PORCINE) 1000 UNIT/ML DIALYSIS: 40.0000 [IU]/kg | INTRAMUSCULAR | Status: DC | PRN

## 2020-12-23 MED ORDER — ALTEPLASE 2 MG IJ SOLR: 4.0000 mg | Freq: Once | INTRAMUSCULAR | Status: AC

## 2020-12-23 MED ORDER — ALTEPLASE 2 MG IJ SOLR
INTRAMUSCULAR | Status: AC
  Administered 2020-12-23: 4 mg
  Filled 2020-12-23: qty 4

## 2020-12-23 NOTE — Progress Notes (Signed)
PROGRESS NOTE    Andrew [name]  LNL:[id number] DOB: November 05, 1931 DOA: 12/10/2020 PCP: [name], MD    Brief Narrative:  87 y.o.malewith a history of thoracic aortic aneurysm status post stent graft repair, AAA, CABG in 2010, chronic HFpEF, BPH with urinary retention, CKDII,and remote atrial fibrillation not anticoagulated who presented to the ED 5/20 prompted by PCP for abnormal blood work which was drawn in the setting of cough that hadn't improved with augmentin. He also had been losing appetite and feeling unwell.  Labs were notable for mild leukocytosis, worsening anemia, renal failure. CT the chest, abdomen, and pelvis is notable for new left upper lobe infiltrate, unchanged stent graft repair of thoracic aortic aneurysm, and grossly stable infrarenal AAA. Patient was treated with rocephin, azithromycin, albuterol, and IV fluids in the ED. SBP nadir was in 70's shortly after admission. Creatinine was 8.71 worsening to 9.32 from previous of 1.09 in Aug 2021. Nephrology was consulted. Further goals of care discussions have continued. The patient and family now confirm their desire for trial of hemodialysis if there continues to be no renal recovery. Trialysis catheter was placed 5/30 with first dialysis session alter that day.   Assessment & Plan:   Principal Problem:   AKI (acute kidney injury) ([place]) Active Problems:   Thoracic aortic aneurysm without rupture ([place])   Coronary artery disease   Urinary retention   AAA (abdominal aortic aneurysm) without rupture ([facility])   Essential hypertension   Normocytic anemia   Chronic diastolic CHF (congestive heart failure) ([place])   Community acquired pneumonia  Acute renal failure superimposed on CKD II; uremia; metabolic acidosis: Initial BUN 161, Cr 8.71 (44 &1.09 in Aug 2021) with serum bicarb of 17. Kidneys appear normal with no obstruction on CT in ED.   -Nephrology following. Trialysis catheter was placed 5/30, first dialysis  5/30. Having some hypotension afterward. - Pt's BP remains soft. On midodrine. - Continue foley catheter, urinary retention noted overnight - Per Nephrology, plan HD today   Hypokalemia:  - Supplemented today. Continue monitoring  Hypomagnesemia:  -Corrected  Hyperphosphatemia:  - Cont with renal diet, calcium carbonate w/meals. - Phos of 5.1 today  Acute hypoxic respiratory failure: Due to CAP, possible aspiration, possible renal failure-related volume overload.  - Continue supplemental oxygen to maintain SpO2 >90% with normal WOB. CXR 5/27 personally reviewed showing very mild interstitial prominence and bibasilar faint opacities consistent with atelectasis.  -Stable this AM  LUL CAP: Has completed 5 days of ceftriaxone, azithromycin. Remains afebrile without leukocytosis. -Still with bouts of occasional coughing, on minimal o2 support  Hypotension: Was improving til dialysis. - Now on midodrine -Recently received gentle hydration for hypotension  Urinary retention: Chronic, BPH.  - Continue foley with ongoing assessment of renal failure.  --Urinary retention noted overnight, requiring cath  CAD: History of PCI and CABG 2010. No anginal complaints currently.  -Continue with ASA, statin  Chronic HFpEF: Echo 5/26 unchanged from prior with LVEF 60-65%, unevaluated diastolic function, no RWMAs. Normal RV, no severe valvular disease.  - Recently attempted HD, however pt did not seem to tolerate given hypotension  - Monitoring I/O, daily weights  Thoracic aortic aneurysm s/p repair:  -Remains stable at this time  AAA: Appears stable on CT in ED -Plan to keep VVS appointment with repeat U/S on 6/21 with Dr. Carlis Abbott.   Iron deficiency anemia: No acute bleeding noted. Hgb rebounding s/p aranesppernephrology. - Hemodynamically - Daily iron supplement.  DVT prophylaxis: heparin subq Code Status: Full  Family Communication: Pt in room, wife and daughter currently  at bedside  Status is: Inpatient  Remains inpatient appropriate because:Inpatient level of care appropriate due to severity of illness   Dispo: The patient is from: Home              Anticipated d/c is to: Unclear at this time              Patient currently is not medically stable to d/c.   Difficult to place patient No    Consultants:   Nephrology  Palliative Care  Procedures:     Antimicrobials: Anti-infectives (From admission, onward)   Start     Dose/Rate Route Frequency Ordered Stop   12/11/20 2000  cefTRIAXone (ROCEPHIN) 2 g in sodium chloride 0.9 % 100 mL IVPB        2 g 200 mL/hr over 30 Minutes Intravenous Every 24 hours 12/10/20 2308 12/14/20 2321   12/11/20 2000  azithromycin (ZITHROMAX) 500 mg in sodium chloride 0.9 % 250 mL IVPB        500 mg 250 mL/hr over 60 Minutes Intravenous Every 24 hours 12/10/20 2308 12/14/20 2133   12/10/20 1945  cefTRIAXone (ROCEPHIN) 1 g in sodium chloride 0.9 % 100 mL IVPB        1 g 200 mL/hr over 30 Minutes Intravenous  Once 12/10/20 1940 12/10/20 2037   12/10/20 1945  azithromycin (ZITHROMAX) 500 mg in sodium chloride 0.9 % 250 mL IVPB        500 mg 250 mL/hr over 60 Minutes Intravenous  Once 12/10/20 1940 12/10/20 2142      Subjective: Still feeling weak  Objective: Vitals:   12/23/20 0400 12/23/20 0500 12/23/20 0731 12/23/20 1217  BP: 99/79  110/69 110/71  Pulse: 71  70 74  Resp: 15  11 16   Temp: 98.1 F (36.7 C)  97.9 F (36.6 C) (!) 97.5 F (36.4 C)  TempSrc: Axillary  Axillary Oral  SpO2: 100%  100% 95%  Weight:  60.9 kg    Height:        Intake/Output Summary (Last 24 hours) at 12/23/2020 1454 Last data filed at 12/23/2020 0900 Gross per 24 hour  Intake 240 ml  Output 425 ml  Net -185 ml   Filed Weights   12/21/20 0458 12/22/20 0531 12/23/20 0500  Weight: 59.9 kg 59.9 kg 60.9 kg    Examination: General exam: Conversant, in no acute distress Respiratory system: normal chest rise, clear, no audible  wheezing Cardiovascular system: regular rhythm, s1-s2 Gastrointestinal system: Nondistended, nontender, pos BS Central nervous system: No seizures, no tremors Extremities: No cyanosis, no joint deformities Skin: No rashes, no pallor Psychiatry: Affect normal // no auditory hallucinations   Data Reviewed: I have personally reviewed following labs and imaging studies  CBC: Recent Labs  Lab 12/17/20 0220 12/20/20 0112 12/21/20 0506 12/23/20 0712  WBC 8.4 10.0 9.1 8.5  HGB 8.8* 8.6* 7.9* 8.1*  HCT 27.4* 26.8* 24.4* 26.3*  MCV 93.8 95.0 93.8 97.4  PLT 266 328 267 096   Basic Metabolic Panel: Recent Labs  Lab 12/18/20 0112 12/19/20 0052 12/20/20 0716 12/21/20 0506 12/22/20 0412 12/23/20 0712  NA 137 137 139 140 139 141  K 3.9 3.6 3.5 3.0* 3.3* 3.8  CL 99 99 105 103 101 106  CO2 21* 22 22 27 25 24   GLUCOSE 133* 98 107* 98 95 91  BUN 96* 111* 108* 64* 74* 82*  CREATININE 7.47* 7.94* 7.44* 4.61* 5.39* 5.80*  CALCIUM 9.0 8.3* 7.9* 8.5* 8.5* 8.4*  MG 2.0  --   --   --   --   --   PHOS 6.0* 6.9*  --  3.8 4.5 5.1*   GFR: Estimated Creatinine Clearance: 7.9 mL/min (A) (by C-G formula based on SCr of 5.8 mg/dL (H)). Liver Function Tests: Recent Labs  Lab 12/18/20 0112 12/19/20 0052 12/21/20 0506 12/22/20 0412 12/23/20 0712  ALBUMIN 2.6* 2.2* 2.0* 2.1* 2.2*   No results for input(s): LIPASE, AMYLASE in the last 168 hours. No results for input(s): AMMONIA in the last 168 hours. Coagulation Profile: No results for input(s): INR, PROTIME in the last 168 hours. Cardiac Enzymes: No results for input(s): CKTOTAL, CKMB, CKMBINDEX, TROPONINI in the last 168 hours. BNP (last 3 results) Recent Labs    01/09/20 1443  PROBNP 1,018*   HbA1C: No results for input(s): HGBA1C in the last 72 hours. CBG: Recent Labs  Lab 12/19/20 1926  GLUCAP 162*   Lipid Profile: No results for input(s): CHOL, HDL, LDLCALC, TRIG, CHOLHDL, LDLDIRECT in the last 72 hours. Thyroid Function  Tests: No results for input(s): TSH, T4TOTAL, FREET4, T3FREE, THYROIDAB in the last 72 hours. Anemia Panel: No results for input(s): VITAMINB12, FOLATE, FERRITIN, TIBC, IRON, RETICCTPCT in the last 72 hours. Sepsis Labs: No results for input(s): PROCALCITON, LATICACIDVEN in the last 168 hours.  No results found for this or any previous visit (from the past 240 hour(s)).   Radiology Studies: No results found.  Scheduled Meds: . (feeding supplement) PROSource Plus  30 mL Oral BID BM  . aspirin  81 mg Oral QPC supper  . atorvastatin  20 mg Oral Daily  . Chlorhexidine Gluconate Cloth  6 each Topical Daily  . feeding supplement  1 Container Oral TID BM  . ferrous sulfate  325 mg Oral Q breakfast  . heparin injection (subcutaneous)  5,000 Units Subcutaneous Q8H  . midodrine  10 mg Oral TID WC  . polyethylene glycol  17 g Oral Daily  . senna-docusate  1 tablet Oral BID   Continuous Infusions:   LOS: 13 days   Marylu Lund, MD [facility] Pager On Amion  If 7PM-7AM, please contact night-coverage 12/23/2020, 2:54 PM

## 2020-12-23 NOTE — Progress Notes (Signed)
Pt had minimal output of 164ml overnight. Denied discomfort and pressure in abdominal region. Bladder scan indicated 840 ml of urine in bladder. MD notified and verbal order to replace Foley given. Foley catheter placed at the bedside 2 west 12 for urinary retention/potentialy occluded foley catheter. Catheter placed by Lorenso Courier, RN and [place], [place]. Procedure and indication explained to pt, and pt agreed. Peri care completed, bed pad and gown changed. Sterile technique maintained throughout procedure.  Pt tolerated well and 939ml of yellow, cloudy urine was drained. Pt resting comfortably in bed. Will continue to monitor pt.

## 2020-12-23 NOTE — Progress Notes (Signed)
Occupational Therapy Treatment Patient Details Name: Andrew [name] MRN: [id number] DOB: 1931-10-16 Today's Date: 12/23/2020    History of present illness 87 y.o. male presented 12/10/20 to the emergency department for evaluation of abnormal blood work. Acute renal failure with uremia;  CT the chest, abdomen, and pelvis is notable for pneumonia. Pt had temporary HD catheter placed 5/30 in preparation for HD. PMH- thoracic aortic aneurysm status post stent graft repair, CABG in 2010, AAA, chronic diastolic CHF, BPH, urinary retention, CKD II, and remote atrial fibrillation not anticoagulated, back surgery   OT comments  Patient making slow progress toward goals. Progress from EOB > BSC > recliner with use of stedy this date. Patient able to stand in [place] with Min A x several trials. Patient also tolerating static standing in stedy for up to 1 min x2 trials. Encouraged patient to complete pericare after BM but patient declined. OT will continue to follow acutely.    Follow Up Recommendations  SNF;Supervision/Assistance - 24 hour    Equipment Recommendations  Other (comment) (Defer to next level of care)    Recommendations for Other Services Rehab consult    Precautions / Restrictions Precautions Precautions: Fall Precaution Comments: HD port Restrictions Weight Bearing Restrictions: No       Mobility Bed Mobility Overal bed mobility: Needs Assistance Bed Mobility: Sidelying to Sit;Rolling;Sit to Supine Rolling: Max assist Sidelying to sit: Max assist;HOB elevated Supine to sit: Max assist     General bed mobility comments: Assist to advance BLE toward EOB and elevate trunk. Patient completes ~25% of the task.    Transfers Overall transfer level: Needs assistance   Transfers: Sit to/from Stand Sit to Stand: +2 physical assistance;Mod assist;Min assist;From elevated surface Stand pivot transfers: Total assist (in stedy)       General transfer comment: min to mod assist  with use of bed pad to bring hips up at times but at times pt helping more. Pt able to fully stand today. Needs tactile cues and assist for full upright standing and cannot stand for long periods of time.  Stood to [employer] x 4 x as he was moved to 3N1 and then to recliner.  Used Stedy for bed to chair    Balance Overall balance assessment: Needs assistance Sitting-balance support: Bilateral upper extremity supported;Feet supported Sitting balance-Leahy Scale: Poor Sitting balance - Comments: fluctuating from requiring min-mod A for countering posterior lean with onset of fatigue Postural control: Posterior lean Standing balance support: Bilateral upper extremity supported Standing balance-Leahy Scale: Poor Standing balance comment: Able to maintain static standing in [place] with Min A.                           ADL either performed or assessed with clinical judgement   ADL Overall ADL's : Needs assistance/impaired     Grooming: Set up;Sitting Grooming Details (indicate cue type and reason): Able to wash face seated on BSC with set-up assist.                 Toilet Transfer: Minimal assistance;+2 for physical assistance;Total assistance;Stand-pivot Toilet Transfer Details (indicate cue type and reason): total A +2 via stedy with pt abel to stand to stedy with MIN A +2 Toileting- Clothing Manipulation and Hygiene: Maximal assistance;Sit to/from stand Toileting - Clothing Manipulation Details (indicate cue type and reason): Max A for hygiene/clothing management in [place]. Patient able to stand for <30 sec at a time.  General ADL Comments: Progressing well toward goals. Able to stand for up to 1 min x2 trials in [place] with Min A and cues for upright posture.     Vision       Perception     Praxis      Cognition Arousal/Alertness: Awake/alert Behavior During Therapy: Flat affect Overall Cognitive Status: Impaired/Different from baseline Area of Impairment:  Attention;Following commands;Problem solving;Safety/judgement                     Memory: Decreased short-term memory Following Commands: Follows one step commands with increased time;Follows one step commands inconsistently Safety/Judgement: Decreased awareness of deficits   Problem Solving: Slow processing;Decreased initiation;Requires verbal cues;Requires tactile cues;Difficulty sequencing General Comments: Patient able to make needs known. Often reports need for BM with movement.        Exercises Exercises: General Lower Extremity General Exercises - Lower Extremity Long Arc Quad: AROM;Both;5 reps;Seated (emphasizing ankle DF and hold x 3 seconds) Hip Flexion/Marching: AROM;Both;5 reps;Seated   Shoulder Instructions       General Comments VSS on 2L O2. SpO2 in 80's during transfer but pleth not reliable. Patient with no obvious s/s of respiratory distress.    Pertinent Vitals/ Pain       Pain Assessment: No/denies pain  Home Living                                          Prior Functioning/Environment              Frequency  Min 2X/week        Progress Toward Goals  OT Goals(current goals can now be found in the care plan section)  Progress towards OT goals: Progressing toward goals  Acute Rehab OT Goals Patient Stated Goal: To be finished with dialysis OT Goal Formulation: With patient Time For Goal Achievement: 12/28/20 Potential to Achieve Goals: Fair ADL Goals Pt Will Perform Grooming: with set-up;sitting Pt Will Perform Upper Body Dressing: with set-up;sitting Pt Will Perform Lower Body Dressing: with min assist;sitting/lateral leans;sit to/from stand Pt Will Transfer to Toilet: ambulating;with min assist Pt Will Perform Toileting - Clothing Manipulation and hygiene: sit to/from stand;with min assist Pt/caregiver will Perform Home Exercise Program: Both right and left upper extremity;With written HEP provided Additional ADL  Goal #1: Patient will maintain static sitting balance at EOB for 15 minutes in prep for ADLs.  Plan Discharge plan remains appropriate;Frequency remains appropriate    Co-evaluation                 AM-PAC OT "6 Clicks" Daily Activity     Outcome Measure   Help from another person eating meals?: A Little Help from another person taking care of personal grooming?: A Little Help from another person toileting, which includes using toliet, bedpan, or urinal?: Total Help from another person bathing (including washing, rinsing, drying)?: A Lot Help from another person to put on and taking off regular upper body clothing?: A Little Help from another person to put on and taking off regular lower body clothing?: A Lot 6 Click Score: 14    End of Session Equipment Utilized During Treatment: Oxygen;Gait belt  OT Visit Diagnosis: Unsteadiness on feet (R26.81);Other abnormalities of gait and mobility (R26.89);Muscle weakness (generalized) (M62.81);History of falling (Z91.81)   Activity Tolerance Patient tolerated treatment well;Patient limited by fatigue   Patient Left in chair;with call  bell/phone within reach;with chair alarm set   Nurse Communication          Time: [phone] OT Time Calculation (min): 28 min  Charges: OT General Charges $OT Visit: 1 Visit OT Treatments $Self Care/Home Management : 23-37 mins  Bufford Helms H. OTR/L Supplemental OT, Department of rehab services [phone]   Analya Louissaint R H. 12/23/2020, 1:19 PM

## 2020-12-23 NOTE — Progress Notes (Signed)
Patient ID: Andrew [name], male   DOB: 11/20/1931, 87 y.o.   MRN: [id number] [facility] Progress Note   Assessment/ Plan:   1. Acute kidney Injury: Nonoliguric with 625 cc urine output charted from overnight.  His renal injury is suspected to be in part from volume depletion/ischemic ATN and obstruction with some retention seen earlier.  He again had urine retention noted this morning with 840 cc of urine in his bladder and a Foley catheter has consequently been placed.  I will order for hemodialysis today for management of azotemia that I fear is compromising his mentation/oral intake.  No ultrafiltration with dialysis and will continue monitoring for renal recovery. 2.  Anemia: Secondary to iron deficiency/acute illness.  On oral iron and status post ESA.  He had some bleeding/oozing from dialysis catheter insertion site that may have been compounded by azotemia/platelet dysfunction. 3.  Acute hypoxic respiratory failure: Associated with community-acquired pneumonia for which he has completed his course of azithromycin/ceftriaxone and remains afebrile/without leukocytosis.  On supplemental oxygenation via nasal cannula. 4.  Hyperphosphatemia: Continue renal diet and discontinue calcium carbonate at this time. 5.  Hypotension: On scheduled midodrine to allow for improved blood pressure/renal perfusion and will follow blood pressure trend closely to decide on need to wean this off..  Subjective:   His wife and his daughter at bedside are concerned that he was more lethargic yesterday leading into today with decreased appetite.   Objective:   BP 110/69 (BP Location: Right Arm)   Pulse 70   Temp 97.9 F (36.6 C) (Axillary)   Resp 11   Ht 5\' 8"  (1.727 m)   Wt 60.9 kg   SpO2 100%   BMI 20.41 kg/m   Intake/Output Summary (Last 24 hours) at 12/23/2020 2130 Last data filed at 12/22/2020 1703 Gross per 24 hour  Intake --  Output 625 ml  Net -625 ml   Weight change: 1  kg  Physical Exam: Gen: Appears fatigued with decreased participation in conservation compared to yesterday CVS: Pulse regular rhythm, normal rate, S1 and S2 normal Resp: Coarse/transmitted breath sounds bilaterally without rales/rhonchi Abd: Soft, flat, nontender, bowel sounds are normal Ext: No palpable lower extremity edema  Imaging: No results found.  Labs: BMET Recent Labs  Lab 12/17/20 0220 12/18/20 0112 12/19/20 0052 12/20/20 0716 12/21/20 0506 12/22/20 0412 12/23/20 0712  NA 140 137 137 139 140 139 141  K 3.5 3.9 3.6 3.5 3.0* 3.3* 3.8  CL 103 99 99 105 103 101 106  CO2 23 21* 22 22 27 25 24   GLUCOSE 101* 133* 98 107* 98 95 91  BUN 95* 96* 111* 108* 64* 74* 82*  CREATININE 7.09* 7.47* 7.94* 7.44* 4.61* 5.39* 5.80*  CALCIUM 8.3* 9.0 8.3* 7.9* 8.5* 8.5* 8.4*  PHOS 5.9* 6.0* 6.9*  --  3.8 4.5 5.1*   CBC Recent Labs  Lab 12/17/20 0220 12/20/20 0112 12/21/20 0506 12/23/20 0712  WBC 8.4 10.0 9.1 8.5  HGB 8.8* 8.6* 7.9* 8.1*  HCT 27.4* 26.8* 24.4* 26.3*  MCV 93.8 95.0 93.8 97.4  PLT 266 328 267 323    Medications:    . (feeding supplement) PROSource Plus  30 mL Oral BID BM  . aspirin  81 mg Oral QPC supper  . atorvastatin  20 mg Oral Daily  . Chlorhexidine Gluconate Cloth  6 each Topical Daily  . feeding supplement  1 Container Oral TID BM  . ferrous sulfate  325 mg Oral Q breakfast  . heparin  injection (subcutaneous)  5,000 Units Subcutaneous Q8H  . midodrine  10 mg Oral TID WC  . polyethylene glycol  17 g Oral Daily  . senna-docusate  1 tablet Oral BID   Andrew Shiley, MD 12/23/2020, 8:38 AM

## 2020-12-23 NOTE — Plan of Care (Signed)

## 2020-12-23 NOTE — Progress Notes (Signed)
HD catheter still not working after dwelling for 17mins. Nephrologist ordered to cathflo again and let it dwell overnight and will have tx  1st shift tomorrow.Marland Kitchen

## 2020-12-23 NOTE — Progress Notes (Signed)
Venous limb of HD catheter do not pull or pull. Dr Posey Pronto ordered to instill cathflo and try again after 18mins.

## 2020-12-24 ENCOUNTER — Encounter (HOSPITAL_COMMUNITY)
Admission: EM | Disposition: A | Discharge status: Skilled Nursing Facility | Payer: Self-pay | Source: Home / Self Care | Attending: Internal Medicine

## 2020-12-24 ENCOUNTER — Inpatient Hospital Stay (HOSPITAL_COMMUNITY): Payer: Medicare Other

## 2020-12-24 DIAGNOSIS — J189 Pneumonia, unspecified organism: Secondary | ICD-10-CM | POA: Diagnosis not present

## 2020-12-24 DIAGNOSIS — I5032 Chronic diastolic (congestive) heart failure: Secondary | ICD-10-CM | POA: Diagnosis not present

## 2020-12-24 DIAGNOSIS — N179 Acute kidney failure, unspecified: Secondary | ICD-10-CM | POA: Diagnosis not present

## 2020-12-24 DIAGNOSIS — J9601 Acute respiratory failure with hypoxia: Secondary | ICD-10-CM | POA: Diagnosis not present

## 2020-12-24 HISTORY — PX: INSERTION OF DIALYSIS CATHETER: SHX1324

## 2020-12-24 LAB — RENAL FUNCTION PANEL
Albumin: 2.3 g/dL — ABNORMAL LOW (ref 3.5–5.0)
Anion gap: 12 (ref 5–15)
BUN: 85 mg/dL — ABNORMAL HIGH (ref 8–23)
CO2: 21 mmol/L — ABNORMAL LOW (ref 22–32)
Calcium: 8.2 mg/dL — ABNORMAL LOW (ref 8.9–10.3)
Chloride: 107 mmol/L (ref 98–111)
Creatinine, Ser: 6.12 mg/dL — ABNORMAL HIGH (ref 0.61–1.24)
GFR, Estimated: 8 mL/min — ABNORMAL LOW (ref >60)
Glucose, Bld: 93 mg/dL (ref 70–99)
Phosphorus: 5.9 mg/dL — ABNORMAL HIGH (ref 2.5–4.6)
Potassium: 3.5 mmol/L (ref 3.5–5.1)
Sodium: 140 mmol/L (ref 135–145)

## 2020-12-24 LAB — HIV ANTIBODY (ROUTINE TESTING W REFLEX): HIV Screen 4th Generation wRfx: NONREACTIVE

## 2020-12-24 LAB — HEPATITIS C ANTIBODY: HCV Ab: NONREACTIVE

## 2020-12-24 SURGERY — INSERTION OF DIALYSIS CATHETER
Anesthesia: LOCAL

## 2020-12-24 MED ORDER — HEPARIN SODIUM (PORCINE) 1000 UNIT/ML IJ SOLN
INTRAMUSCULAR | Status: AC
  Administered 2020-12-24: 1000 [IU]
  Filled 2020-12-24: qty 3

## 2020-12-24 MED ORDER — HEPARIN SODIUM (PORCINE) 1000 UNIT/ML IJ SOLN
INTRAMUSCULAR | Status: AC
  Filled 2020-12-24: qty 1

## 2020-12-24 MED ORDER — HEPARIN SODIUM (PORCINE) 1000 UNIT/ML IJ SOLN
INTRAMUSCULAR | Status: AC
  Filled 2020-12-24: qty 2

## 2020-12-24 NOTE — Progress Notes (Signed)
PT Cancellation Note  Patient Details Name: ASHAZ ROBLING MRN: [id number] DOB: 03/19/1932   Cancelled Treatment:    Reason Eval/Treat Not Completed: Patient at procedure or test/unavailable (Pt in HD.)   Alvira Philips 12/24/2020, 10:27 AM

## 2020-12-24 NOTE — Progress Notes (Signed)
Patient ID: Andrew [name], male   DOB: 11/14/1931, 87 y.o.   MRN: [id number] [facility] Progress Note   Assessment/ Plan:   1. Acute kidney Injury:  His renal injury is suspected to be in part from volume depletion/ischemic ATN and obstruction with urine retention. Urine output not charted and unfortunately only had 5 minutes of dialysis yesterday because of catheter dysfunction- this has NOT improved overnight with tPA dwell and he will need a new catheter. I will reach out to CCM and IR. I am surprised at his labs in spite of minimal dialysis done.  2.  Anemia: Secondary to iron deficiency/acute illness.  On oral iron and status post ESA.  He had some bleeding/oozing from dialysis catheter insertion site that is resolved. 3.  Acute hypoxic respiratory failure: Associated with community-acquired pneumonia for which he has completed his course of azithromycin/ceftriaxone and remains afebrile/without leukocytosis.  On supplemental oxygenation via nasal cannula. 4.  Hyperphosphatemia: Continue renal diet and discontinue calcium carbonate at this time. 5.  Hypotension: On scheduled midodrine to allow for improved blood pressure/renal perfusion and will follow blood pressure trend closely to decide on need to wean this off.  Subjective:   HD catheter problems overnight with minimal hemodialysis done yesterday.   Objective:   BP (!) 150/66 (BP Location: Right Arm)   Pulse 61   Temp 98.4 F (36.9 C) (Oral)   Resp 10   Ht 5\' 8"  (1.727 m)   Wt 61.2 kg   SpO2 98%   BMI 20.51 kg/m   Intake/Output Summary (Last 24 hours) at 12/24/2020 0935 Last data filed at 12/23/2020 2000 Gross per 24 hour  Intake 0 ml  Output -487 ml  Net 487 ml   Weight change: 0.2 kg  Physical Exam: Gen: Resting comfortably in dialysis CVS: Pulse regular rhythm, normal rate, S1 and S2 normal Resp: Coarse/transmitted breath sounds bilaterally without rales/rhonchi. Temporary LIJ HD catheter Abd: Soft,  flat, nontender, bowel sounds are normal Ext: No palpable lower extremity edema  Imaging: No results found.  Labs: BMET Recent Labs  Lab 12/18/20 0112 12/19/20 0052 12/20/20 0716 12/21/20 0506 12/22/20 0412 12/23/20 0712 12/24/20 0202  NA 137 137 139 140 139 141 140  K 3.9 3.6 3.5 3.0* 3.3* 3.8 3.5  CL 99 99 105 103 101 106 107  CO2 21* 22 22 27 25 24  21*  GLUCOSE 133* 98 107* 98 95 91 93  BUN 96* 111* 108* 64* 74* 82* 85*  CREATININE 7.47* 7.94* 7.44* 4.61* 5.39* 5.80* 6.12*  CALCIUM 9.0 8.3* 7.9* 8.5* 8.5* 8.4* 8.2*  PHOS 6.0* 6.9*  --  3.8 4.5 5.1* 5.9*   CBC Recent Labs  Lab 12/20/20 0112 12/21/20 0506 12/23/20 0712  WBC 10.0 9.1 8.5  HGB 8.6* 7.9* 8.1*  HCT 26.8* 24.4* 26.3*  MCV 95.0 93.8 97.4  PLT 328 267 323    Medications:    . (feeding supplement) PROSource Plus  30 mL Oral BID BM  . aspirin  81 mg Oral QPC supper  . atorvastatin  20 mg Oral Daily  . Chlorhexidine Gluconate Cloth  6 each Topical Daily  . feeding supplement  1 Container Oral TID BM  . ferrous sulfate  325 mg Oral Q breakfast  . heparin injection (subcutaneous)  5,000 Units Subcutaneous Q8H  . midodrine  10 mg Oral TID WC  . polyethylene glycol  17 g Oral Daily  . senna-docusate  1 tablet Oral BID   Elmarie Shiley, MD  12/24/2020, 9:35 AM

## 2020-12-24 NOTE — Progress Notes (Signed)
Received pt from 5W16. Alert. Pre assessment done. Upon checking catheter access arterial lumen with good flow and inflow. Venous lumen with resistance outflow and inflow. Dr Posey Pronto is aware. And pt send back to his room and informed the nurse.

## 2020-12-24 NOTE — Progress Notes (Signed)
PT Cancellation Note  Patient Details Name: Andrew [name] MRN: [id number] DOB: 11/05/1931   Cancelled Treatment:    Reason Eval/Treat Not Completed: Patient at procedure or test/unavailable (in pm, pt in OR getting a new HD access)   Alvira Philips 12/24/2020, 3:48 PM Fraya Ueda M,PT Acute Rehab Services [phone] (pager)

## 2020-12-24 NOTE — TOC Progression Note (Signed)
Transition of Care [facility]) - Progression Note    Patient Details  Name: Andrew [name] MRN: [id number] Date of Birth: 09/30/1931  [facility]) CM/SW Contact  Joanne Chars, LCSW Phone Number: 12/24/2020, 1:45 PM  Clinical Narrative: CSW spoke with pt wife [name].  They are not making much progress with [facility], the people they need to meet with are out of the office until next Tuesday.  They are continuing to make attempts.  She reaffirmed that [facility] is her second choice.  CSW spoke with Narda Rutherford at [facility] and updated her.  She is somewhat tight with beds currently and asked that we recontact her when pt is closer to DC.  Josem Kaufmann has not been submitted as pt has had ongoing complications and DC date is not yet clear.       Expected Discharge Plan: [place] Barriers to Discharge: Insurance Authorization,Continued Medical Work up  Expected Discharge Plan and Services Expected Discharge Plan: [place] Choice: [place] arrangements for the past 2 months: Single Family Home                                       Social Determinants of Health (SDOH) Interventions    Readmission Risk Interventions No flowsheet data found.

## 2020-12-24 NOTE — Progress Notes (Signed)
Nutrition Follow-up   DOCUMENTATION CODES:  Not applicable  INTERVENTION:   Recommend liberalizing pt's diet to regular to optimize po intake  Recommend use of phosphorus binder given hyperphosphatemia   Continue Boost Breeze po TID, each supplement provides 250 kcal and 9 grams of protein  Continue 67ml Prosource Plus po BID, each supplement provides 100 kcals and 15 grams of protein  NUTRITION DIAGNOSIS:  Inadequate oral intake related to poor appetite as evidenced by per patient/family report. ongoing  GOAL:  Patient will meet greater than or equal to 90% of their needs progressing  MONITOR:  PO intake,Supplement acceptance,Labs,Weight trends,I & O's  REASON FOR ASSESSMENT:  Consult Diet education  ASSESSMENT:  Pt admitted with ARF superimposed on CKD II; uremia; metabolic acidosis. PMH includes thoracic aortic aneurysm s/p stent graft repair, CAD s/p CABG (2010), AAA, HTN, CHF, BPH with urinary retention requiring intemittent self-catheterization, Afib.   5/30 s/p temp HD cath placement; trial HD initiation   6/03 re-Insertion of Non-tunneled Central Venous Catheter without US guidance change over wire for malfunctioning catheter  Pt out of room at time of RD visit. Per RN, pt had HD attempted overnight but HD cath malfunctioned (-487net UF noted), so pt had to have new HD placed today followed by HD. Limited meal intake since last RD visit, 25% meal completion x1 recorded meal. Pt doing well with supplements per RN.    Given pt's poor intake, diet is likely not contributing to pt's hyperphosphatemia. Continue to recommend diet liberalization to optimize po intake and also recommend initiation of phosphorus binder to help lower phosphorus levels.    No UOP documented x24 hours  Medications: miralax, ferrous sulfate, senokot-s Labs: Recent Labs  Lab 12/18/20 0112 12/19/20 0052 12/22/20 0412 12/23/20 0712 12/24/20 0202  NA 137   < > 139 141 140  K 3.9   < > 3.3*  3.8 3.5  CL 99   < > 101 106 107  CO2 21*   < > 25 24 21*  BUN 96*   < > 74* 82* 85*  CREATININE 7.47*   < > 5.39* 5.80* 6.12*  CALCIUM 9.0   < > 8.5* 8.4* 8.2*  MG 2.0  --   --   --   --   PHOS 6.0*   < > 4.5 5.1* 5.9*  GLUCOSE 133*   < > 95 91 93   < > = values in this interval not displayed.   Diet Order:   Diet Order            Diet renal with fluid restriction Fluid restriction: Other (see comments); Room service appropriate? Yes; Fluid consistency: Thin  Diet effective now                EDUCATION NEEDS:  No education needs have been identified at this time  Skin:  Skin Assessment: Reviewed RN Assessment  Last BM:  6/1 type 3  Height:  Ht Readings from Last 1 Encounters:  12/10/20 5\' 8"  (1.727 m)   Weight:  Wt Readings from Last 1 Encounters:  12/24/20 60.7 kg   BMI:  Body mass index is 20.35 kg/m.  Estimated Nutritional Needs:  Kcal:  4196-2229 Protein:  85-95g Fluid:  >1.75L/d    Larkin Ina, MS, RD, LDN RD pager number and weekend/on-call pager number located in [place].

## 2020-12-24 NOTE — Progress Notes (Signed)
PROGRESS NOTE    Andrew [name]  FXT:[id number] DOB: 12/23/1931 DOA: 12/10/2020 PCP: [name], MD    Brief Narrative:  87 y.o.malewith a history of thoracic aortic aneurysm status post stent graft repair, AAA, CABG in 2010, chronic HFpEF, BPH with urinary retention, CKDII,and remote atrial fibrillation not anticoagulated who presented to the ED 5/20 prompted by PCP for abnormal blood work which was drawn in the setting of cough that hadn't improved with augmentin. He also had been losing appetite and feeling unwell.  Labs were notable for mild leukocytosis, worsening anemia, renal failure. CT the chest, abdomen, and pelvis is notable for new left upper lobe infiltrate, unchanged stent graft repair of thoracic aortic aneurysm, and grossly stable infrarenal AAA. Patient was treated with rocephin, azithromycin, albuterol, and IV fluids in the ED. SBP nadir was in 70's shortly after admission. Creatinine was 8.71 worsening to 9.32 from previous of 1.09 in Aug 2021. Nephrology was consulted. Further goals of care discussions have continued. The patient and family now confirm their desire for trial of hemodialysis if there continues to be no renal recovery. Trialysis catheter was placed 5/30 with first dialysis session alter that day.   Assessment & Plan:   Principal Problem:   AKI (acute kidney injury) ([place]) Active Problems:   Thoracic aortic aneurysm without rupture ([place])   Coronary artery disease   Urinary retention   AAA (abdominal aortic aneurysm) without rupture (H[facility])   Essential hypertension   Normocytic anemia   Chronic diastolic CHF (congestive heart failure) ([place])   Community acquired pneumonia  Acute renal failure superimposed on CKD II; uremia; metabolic acidosis: Initial BUN 161, Cr 8.71 (44 &1.09 in Aug 2021) with serum bicarb of 17. Kidneys appear normal with no obstruction on CT in ED.   -Nephrology following. Trialysis catheter was placed 5/30, first dialysis  5/30. Having some hypotension afterward. - Pt had been continued on midodrine. - Continued with foley catheter for recurrent urinary retention - Attempted HD overnight, however HD cath malfunctioned, new HD cath today with planned HD later. Discussed with Nephrology  Hypokalemia:  - currently stable -Cont to follow lytes  Hypomagnesemia:  -most recently 2.0 on 5/28  Hyperphosphatemia:  - Cont with renal diet, calcium carbonate w/meals. - Phos of 5.9 today  Acute hypoxic respiratory failure: Due to CAP, possible aspiration, possible renal failure-related volume overload.  - Continue supplemental oxygen to maintain SpO2 >90% with normal WOB. CXR 5/27 personally reviewed showing very mild interstitial prominence and bibasilar faint opacities consistent with atelectasis.  -Stable this AM  LUL CAP: Has completed 5 days of ceftriaxone, azithromycin. Remains afebrile without leukocytosis. -on minimal O2 support  Hypotension: Was improving til dialysis. - Now on midodrine -Recently received gentle hydration for hypotension  Urinary retention: Chronic, BPH.  --with foley cath  CAD: History of PCI and CABG 2010. No anginal complaints currently.  -Continue with ASA, statin  Chronic HFpEF: Echo 5/26 unchanged from prior with LVEF 60-65%, unevaluated diastolic function, no RWMAs. Normal RV, no severe valvular disease.  - Undergoing HD per Nephrology recs  Thoracic aortic aneurysm s/p repair:  -Remains stable at this time  AAA: Appears stable on CT in ED -Plan to keep VVS appointment with repeat U/S on 6/21 with Dr. Carlis Abbott.   Iron deficiency anemia: No acute bleeding noted. Hgb rebounding s/p aranesppernephrology. - Hemodynamically - Continue with daily iron supplement.  DVT prophylaxis: heparin subq Code Status: Full Family Communication: Pt in room, wife and daughter currently at bedside  Status is: Inpatient  Remains inpatient appropriate because:Inpatient  level of care appropriate due to severity of illness  Dispo: The patient is from: Home              Anticipated d/c is to: Unclear at this time              Patient currently is not medically stable to d/c.   Difficult to place patient No   Consultants:   Nephrology  Palliative Care  Procedures:     Antimicrobials: Anti-infectives (From admission, onward)   Start     Dose/Rate Route Frequency Ordered Stop   12/11/20 2000  cefTRIAXone (ROCEPHIN) 2 g in sodium chloride 0.9 % 100 mL IVPB        2 g 200 mL/hr over 30 Minutes Intravenous Every 24 hours 12/10/20 2308 12/14/20 2321   12/11/20 2000  azithromycin (ZITHROMAX) 500 mg in sodium chloride 0.9 % 250 mL IVPB        500 mg 250 mL/hr over 60 Minutes Intravenous Every 24 hours 12/10/20 2308 12/14/20 2133   12/10/20 1945  cefTRIAXone (ROCEPHIN) 1 g in sodium chloride 0.9 % 100 mL IVPB        1 g 200 mL/hr over 30 Minutes Intravenous  Once 12/10/20 1940 12/10/20 2037   12/10/20 1945  azithromycin (ZITHROMAX) 500 mg in sodium chloride 0.9 % 250 mL IVPB        500 mg 250 mL/hr over 60 Minutes Intravenous  Once 12/10/20 1940 12/10/20 2142      Subjective: Feeling tired today  Objective: Vitals:   12/24/20 1400 12/24/20 1441 12/24/20 1447 12/24/20 1500  BP: 139/79 136/82 127/77 116/69  Pulse: 64 71 (!) 48 71  Resp: 13 16 11 12   Temp: 98.4 F (36.9 C)     TempSrc: Oral     SpO2: 93% 92% 91% 93%  Weight:      Height:        Intake/Output Summary (Last 24 hours) at 12/24/2020 1542 Last data filed at 12/23/2020 2000 Gross per 24 hour  Intake 0 ml  Output -487 ml  Net 487 ml   Filed Weights   12/23/20 0500 12/23/20 1558 12/23/20 1713  Weight: 60.9 kg 61.1 kg 61.2 kg    Examination: General exam: Awake, laying in bed, in nad Respiratory system: Normal respiratory effort, no wheezing Cardiovascular system: regular rate, s1, s2 Gastrointestinal system: Soft, nondistended, positive BS Central nervous system: CN2-12  grossly intact, strength intact Extremities: Perfused, no clubbing Skin: Normal skin turgor, no notable skin lesions seen Psychiatry: Mood normal // no visual hallucinations   Data Reviewed: I have personally reviewed following labs and imaging studies  CBC: Recent Labs  Lab 12/20/20 0112 12/21/20 0506 12/23/20 0712  WBC 10.0 9.1 8.5  HGB 8.6* 7.9* 8.1*  HCT 26.8* 24.4* 26.3*  MCV 95.0 93.8 97.4  PLT 328 267 557   Basic Metabolic Panel: Recent Labs  Lab 12/18/20 0112 12/19/20 0052 12/20/20 0716 12/21/20 0506 12/22/20 0412 12/23/20 0712 12/24/20 0202  NA 137 137 139 140 139 141 140  K 3.9 3.6 3.5 3.0* 3.3* 3.8 3.5  CL 99 99 105 103 101 106 107  CO2 21* 22 22 27 25 24  21*  GLUCOSE 133* 98 107* 98 95 91 93  BUN 96* 111* 108* 64* 74* 82* 85*  CREATININE 7.47* 7.94* 7.44* 4.61* 5.39* 5.80* 6.12*  CALCIUM 9.0 8.3* 7.9* 8.5* 8.5* 8.4* 8.2*  MG 2.0  --   --   --   --   --   --  PHOS 6.0* 6.9*  --  3.8 4.5 5.1* 5.9*   GFR: Estimated Creatinine Clearance: 7.5 mL/min (A) (by C-G formula based on SCr of 6.12 mg/dL (H)). Liver Function Tests: Recent Labs  Lab 12/19/20 0052 12/21/20 0506 12/22/20 0412 12/23/20 0712 12/24/20 0202  ALBUMIN 2.2* 2.0* 2.1* 2.2* 2.3*   No results for input(s): LIPASE, AMYLASE in the last 168 hours. No results for input(s): AMMONIA in the last 168 hours. Coagulation Profile: No results for input(s): INR, PROTIME in the last 168 hours. Cardiac Enzymes: No results for input(s): CKTOTAL, CKMB, CKMBINDEX, TROPONINI in the last 168 hours. BNP (last 3 results) Recent Labs    01/09/20 1443  PROBNP 1,018*   HbA1C: No results for input(s): HGBA1C in the last 72 hours. CBG: Recent Labs  Lab 12/19/20 1926  GLUCAP 162*   Lipid Profile: No results for input(s): CHOL, HDL, LDLCALC, TRIG, CHOLHDL, LDLDIRECT in the last 72 hours. Thyroid Function Tests: No results for input(s): TSH, T4TOTAL, FREET4, T3FREE, THYROIDAB in the last 72  hours. Anemia Panel: No results for input(s): VITAMINB12, FOLATE, FERRITIN, TIBC, IRON, RETICCTPCT in the last 72 hours. Sepsis Labs: No results for input(s): PROCALCITON, LATICACIDVEN in the last 168 hours.  No results found for this or any previous visit (from the past 240 hour(s)).   Radiology Studies: DG CHEST PORT 1 VIEW  Result Date: 12/24/2020 CLINICAL DATA:  Central line placement. EXAM: PORTABLE CHEST 1 VIEW COMPARISON:  Dec 17, 2020. FINDINGS: Stable cardiomegaly. Status post coronary bypass graft. Stable position stent graft involving descending thoracic aorta. Interval placement of left internal jugular dialysis catheter with distal tip in expected position of cavoatrial junction. No pneumothorax is noted. Mild bibasilar atelectasis or infiltrates are noted. Bony thorax is unremarkable. IMPRESSION: Interval placement of left internal jugular dialysis catheter with distal tip in expected position of cavoatrial junction. Mild bibasilar atelectasis or infiltrates are noted. Electronically Signed   By: Marijo Conception M.D.   On: 12/24/2020 15:19    Scheduled Meds: . (feeding supplement) PROSource Plus  30 mL Oral BID BM  . aspirin  81 mg Oral QPC supper  . atorvastatin  20 mg Oral Daily  . Chlorhexidine Gluconate Cloth  6 each Topical Daily  . feeding supplement  1 Container Oral TID BM  . ferrous sulfate  325 mg Oral Q breakfast  . heparin injection (subcutaneous)  5,000 Units Subcutaneous Q8H  . midodrine  10 mg Oral TID WC  . polyethylene glycol  17 g Oral Daily  . senna-docusate  1 tablet Oral BID   Continuous Infusions:   LOS: 14 days   Marylu Lund, MD [facility] Pager On Amion  If 7PM-7AM, please contact night-coverage 12/24/2020, 3:42 PM

## 2020-12-24 NOTE — Procedures (Signed)
Central Venous Catheter Insertion Procedure Note  Andrew [name]  [id number]  11/29/1931  Date:12/24/20  Time:3:22 PM   Provider Performing:Andrew [name] Andrew [name]   Procedure: re-Insertion of Non-tunneled Central Venous Catheter(36556)without US guidance  Change over wire for malfunctioning catheter Indication(s) Hemodialysis  Consent Risks of the procedure as well as the alternatives and risks of each were explained to the patient and/or caregiver.  Consent for the procedure was obtained and is signed in the bedside chart  Anesthesia Topical only with 1% lidocaine   Timeout Verified patient identification, verified procedure, site/side was marked, verified correct patient position, special equipment/implants available, medications/allergies/relevant history reviewed, required imaging and test results available.  Sterile Technique Maximal sterile technique including full sterile barrier drape, hand hygiene, sterile gown, sterile gloves, mask, hair covering, sterile ultrasound probe cover (if used).  Procedure Description Area of catheter insertion was cleaned with chlorhexidine and draped in sterile fashion.   Without real-time ultrasound guidance a HD catheter was placed into the left internal jugular vein.  Nonpulsatile blood flow and easy flushing noted in all ports.  The catheter was sutured in place and sterile dressing applied.  Complications/Tolerance None; patient tolerated the procedure well. Chest X-ray is ordered to verify placement for internal jugular or subclavian cannulation.  Chest x-ray is not ordered for femoral cannulation.  EBL Minimal  Specimen(s) None  Non pulsatile blood noted in replaced catheter. No air aspirated thru entire procedure

## 2020-12-25 DIAGNOSIS — J9601 Acute respiratory failure with hypoxia: Secondary | ICD-10-CM | POA: Diagnosis not present

## 2020-12-25 DIAGNOSIS — N179 Acute kidney failure, unspecified: Secondary | ICD-10-CM | POA: Diagnosis not present

## 2020-12-25 DIAGNOSIS — I5032 Chronic diastolic (congestive) heart failure: Secondary | ICD-10-CM | POA: Diagnosis not present

## 2020-12-25 LAB — RENAL FUNCTION PANEL
Albumin: 2.3 g/dL — ABNORMAL LOW (ref 3.5–5.0)
Anion gap: 12 (ref 5–15)
BUN: 31 mg/dL — ABNORMAL HIGH (ref 8–23)
CO2: 26 mmol/L (ref 22–32)
Calcium: 8.1 mg/dL — ABNORMAL LOW (ref 8.9–10.3)
Chloride: 100 mmol/L (ref 98–111)
Creatinine, Ser: 3.23 mg/dL — ABNORMAL HIGH (ref 0.61–1.24)
GFR, Estimated: 18 mL/min — ABNORMAL LOW (ref >60)
Glucose, Bld: 97 mg/dL (ref 70–99)
Phosphorus: 3.4 mg/dL (ref 2.5–4.6)
Potassium: 3.7 mmol/L (ref 3.5–5.1)
Sodium: 138 mmol/L (ref 135–145)

## 2020-12-25 NOTE — Progress Notes (Signed)
PROGRESS NOTE    Andrew [name]  TKW:[id number] DOB: 10/23/1931 DOA: 12/10/2020 PCP: [name], MD    Brief Narrative:  87 y.o.malewith a history of thoracic aortic aneurysm status post stent graft repair, AAA, CABG in 2010, chronic HFpEF, BPH with urinary retention, CKDII,and remote atrial fibrillation not anticoagulated who presented to the ED 5/20 prompted by PCP for abnormal blood work which was drawn in the setting of cough that hadn't improved with augmentin. He also had been losing appetite and feeling unwell.  Labs were notable for mild leukocytosis, worsening anemia, renal failure. CT the chest, abdomen, and pelvis is notable for new left upper lobe infiltrate, unchanged stent graft repair of thoracic aortic aneurysm, and grossly stable infrarenal AAA. Patient was treated with rocephin, azithromycin, albuterol, and IV fluids in the ED. SBP nadir was in 70's shortly after admission. Creatinine was 8.71 worsening to 9.32 from previous of 1.09 in Aug 2021. Nephrology was consulted. Further goals of care discussions have continued. The patient and family now confirm their desire for trial of hemodialysis if there continues to be no renal recovery. Trialysis catheter was placed 5/30 with first dialysis session alter that day.   Assessment & Plan:   Principal Problem:   AKI (acute kidney injury) ([facility]) Active Problems:   Thoracic aortic aneurysm without rupture ([place])   Coronary artery disease   Urinary retention   AAA (abdominal aortic aneurysm) without rupture ([facility])   Essential hypertension   Normocytic anemia   Chronic diastolic CHF (congestive heart failure) ([place])   Community acquired pneumonia  Acute renal failure superimposed on CKD II; uremia; metabolic acidosis: Initial BUN 161, Cr 8.71 (44 &1.09 in Aug 2021) with serum bicarb of 17. Kidneys appear normal with no obstruction on CT in ED.   -Nephrology following. Trialysis catheter was placed 5/30, first dialysis  5/30. Having some hypotension afterward. - Pt had been continued on midodrine. - Continued with foley catheter for recurrent urinary retention - on 6/3 had HD cath replaced secondary to malfunction, since then tolerating HD -Cr down to 3 today -Per Nephrology, plan to monitor over weekend and f/u afterwards if pt needs longer-term HD  Hypokalemia:  - currently stable -Recheck lytes in AM  Hypomagnesemia:  -most recently 2.0 on 5/28  Hyperphosphatemia:  - Cont with renal diet, calcium carbonate w/meals. - phos of 3.4 today  Acute hypoxic respiratory failure: Due to CAP, possible aspiration, possible renal failure-related volume overload.  - Continue supplemental oxygen to maintain SpO2 >90% with normal WOB. CXR 5/27 personally reviewed showing very mild interstitial prominence and bibasilar faint opacities consistent with atelectasis.  -remains stable this AM  LUL CAP: Has completed 5 days of ceftriaxone, azithromycin. Remains afebrile without leukocytosis. -on minimal O2 support  Hypotension: Was improving til dialysis. - Now on midodrine -Recently received gentle hydration for hypotension  Urinary retention: Chronic, BPH.  --continued with foley cath  CAD: History of PCI and CABG 2010. No anginal complaints currently.  -Continue with ASA, statin  Chronic HFpEF: Echo 5/26 unchanged from prior with LVEF 60-65%, unevaluated diastolic function, no RWMAs. Normal RV, no severe valvular disease.  - Undergoing HD per Nephrology recs  Thoracic aortic aneurysm s/p repair:  -Remains stable at this time  AAA: Appears stable on CT in ED -Plan to keep [facility] appointment with repeat U/S on 6/21 with Dr. Carlis Abbott.   Iron deficiency anemia: No acute bleeding noted. Hgb rebounding s/p aranesppernephrology. - Hemodynamically - Continue with daily iron supplement.  DVT  prophylaxis: heparin subq Code Status: Full Family Communication: Pt in room, pt's wife at  bedside  Status is: Inpatient  Remains inpatient appropriate because:Inpatient level of care appropriate due to severity of illness  Dispo: The patient is from: Home              Anticipated d/c is to: Unclear at this time              Patient currently is not medically stable to d/c.   Difficult to place patient No   Consultants:   Nephrology  Palliative Care  Procedures:     Antimicrobials: Anti-infectives (From admission, onward)   Start     Dose/Rate Route Frequency Ordered Stop   12/11/20 2000  cefTRIAXone (ROCEPHIN) 2 g in sodium chloride 0.9 % 100 mL IVPB        2 g 200 mL/hr over 30 Minutes Intravenous Every 24 hours 12/10/20 2308 12/14/20 2321   12/11/20 2000  azithromycin (ZITHROMAX) 500 mg in sodium chloride 0.9 % 250 mL IVPB        500 mg 250 mL/hr over 60 Minutes Intravenous Every 24 hours 12/10/20 2308 12/14/20 2133   12/10/20 1945  cefTRIAXone (ROCEPHIN) 1 g in sodium chloride 0.9 % 100 mL IVPB        1 g 200 mL/hr over 30 Minutes Intravenous  Once 12/10/20 1940 12/10/20 2037   12/10/20 1945  azithromycin (ZITHROMAX) 500 mg in sodium chloride 0.9 % 250 mL IVPB        500 mg 250 mL/hr over 60 Minutes Intravenous  Once 12/10/20 1940 12/10/20 2142      Subjective: Without complaints. Reports better tolerating HD   Objective: Vitals:   12/25/20 0735 12/25/20 1144 12/25/20 1159 12/25/20 1607  BP: 94/62  134/71 137/89  Pulse: 72  64 68  Resp: 16  12 13   Temp: 98 F (36.7 C)  98.1 F (36.7 C) 98.1 F (36.7 C)  TempSrc: Oral  Oral Oral  SpO2: 93% 91% 97% 96%  Weight:      Height:        Intake/Output Summary (Last 24 hours) at 12/25/2020 1632 Last data filed at 12/25/2020 1107 Gross per 24 hour  Intake 200 ml  Output 1250 ml  Net -1050 ml   Filed Weights   12/23/20 1713 12/24/20 1618 12/24/20 1923  Weight: 61.2 kg 60.7 kg 60.5 kg    Examination: General exam: Conversant, in no acute distress Respiratory system: normal chest rise, clear, no  audible wheezing Cardiovascular system: regular rhythm, s1-s2 Gastrointestinal system: Nondistended, nontender, pos BS Central nervous system: No seizures, no tremors Extremities: No cyanosis, no joint deformities Skin: No rashes, no pallor Psychiatry: Affect normal // no auditory hallucinations   Data Reviewed: I have personally reviewed following labs and imaging studies  CBC: Recent Labs  Lab 12/20/20 0112 12/21/20 0506 12/23/20 0712  WBC 10.0 9.1 8.5  HGB 8.6* 7.9* 8.1*  HCT 26.8* 24.4* 26.3*  MCV 95.0 93.8 97.4  PLT 328 267 063   Basic Metabolic Panel: Recent Labs  Lab 12/21/20 0506 12/22/20 0412 12/23/20 0712 12/24/20 0202 12/25/20 0229  NA 140 139 141 140 138  K 3.0* 3.3* 3.8 3.5 3.7  CL 103 101 106 107 100  CO2 27 25 24  21* 26  GLUCOSE 98 95 91 93 97  BUN 64* 74* 82* 85* 31*  CREATININE 4.61* 5.39* 5.80* 6.12* 3.23*  CALCIUM 8.5* 8.5* 8.4* 8.2* 8.1*  PHOS 3.8 4.5 5.1* 5.9*  3.4   GFR: Estimated Creatinine Clearance: 14 mL/min (A) (by C-G formula based on SCr of 3.23 mg/dL (H)). Liver Function Tests: Recent Labs  Lab 12/21/20 0506 12/22/20 0412 12/23/20 0712 12/24/20 0202 12/25/20 0229  ALBUMIN 2.0* 2.1* 2.2* 2.3* 2.3*   No results for input(s): LIPASE, AMYLASE in the last 168 hours. No results for input(s): AMMONIA in the last 168 hours. Coagulation Profile: No results for input(s): INR, PROTIME in the last 168 hours. Cardiac Enzymes: No results for input(s): CKTOTAL, CKMB, CKMBINDEX, TROPONINI in the last 168 hours. BNP (last 3 results) Recent Labs    01/09/20 1443  PROBNP 1,018*   HbA1C: No results for input(s): HGBA1C in the last 72 hours. CBG: Recent Labs  Lab 12/19/20 1926  GLUCAP 162*   Lipid Profile: No results for input(s): CHOL, HDL, LDLCALC, TRIG, CHOLHDL, LDLDIRECT in the last 72 hours. Thyroid Function Tests: No results for input(s): TSH, T4TOTAL, FREET4, T3FREE, THYROIDAB in the last 72 hours. Anemia Panel: No results  for input(s): VITAMINB12, FOLATE, FERRITIN, TIBC, IRON, RETICCTPCT in the last 72 hours. Sepsis Labs: No results for input(s): PROCALCITON, LATICACIDVEN in the last 168 hours.  No results found for this or any previous visit (from the past 240 hour(s)).   Radiology Studies: DG CHEST PORT 1 VIEW  Result Date: 12/24/2020 CLINICAL DATA:  Central line placement. EXAM: PORTABLE CHEST 1 VIEW COMPARISON:  Dec 17, 2020. FINDINGS: Stable cardiomegaly. Status post coronary bypass graft. Stable position stent graft involving descending thoracic aorta. Interval placement of left internal jugular dialysis catheter with distal tip in expected position of cavoatrial junction. No pneumothorax is noted. Mild bibasilar atelectasis or infiltrates are noted. Bony thorax is unremarkable. IMPRESSION: Interval placement of left internal jugular dialysis catheter with distal tip in expected position of cavoatrial junction. Mild bibasilar atelectasis or infiltrates are noted. Electronically Signed   By: Marijo Conception M.D.   On: 12/24/2020 15:19    Scheduled Meds: . (feeding supplement) PROSource Plus  30 mL Oral BID BM  . aspirin  81 mg Oral QPC supper  . atorvastatin  20 mg Oral Daily  . Chlorhexidine Gluconate Cloth  6 each Topical Daily  . feeding supplement  1 Container Oral TID BM  . ferrous sulfate  325 mg Oral Q breakfast  . heparin injection (subcutaneous)  5,000 Units Subcutaneous Q8H  . midodrine  10 mg Oral TID WC  . polyethylene glycol  17 g Oral Daily  . senna-docusate  1 tablet Oral BID   Continuous Infusions:   LOS: 15 days   Marylu Lund, MD [facility] Pager On Amion  If 7PM-7AM, please contact night-coverage 12/25/2020, 4:32 PM

## 2020-12-25 NOTE — Plan of Care (Signed)

## 2020-12-25 NOTE — Progress Notes (Signed)
Patient ID: Andrew [name], male   DOB: 09/11/1931, 87 y.o.   MRN: [id number]  [facility] Progress Note   Assessment/ Plan:   1. Acute kidney Injury:  His renal injury is suspected to be in part from volume depletion/ischemic ATN and obstruction with urine retention. Urine output in nonoliguric range with 900 cc urine output charted overnight.  Labs better because of dialysis yesterday.  The plan is to monitor him through the weekend for renal recovery and to decide whether he will need longer-term dialysis.  2.  Anemia: Secondary to iron deficiency/acute illness.  On oral iron and status post ESA.  3.  Acute hypoxic respiratory failure: Associated with community-acquired pneumonia for which he has completed his course of azithromycin/ceftriaxone and remains afebrile/without leukocytosis.  On supplemental oxygenation via nasal cannula with continued fluctuation of energy/mental status. 4.  Hyperphosphatemia: Corrected with renal diet and hemodialysis.  Binders discontinued 5.  Hypotension: He remains on scheduled midodrine for blood pressure support, not getting ultrafiltration with dialysis.  Subjective:   Hemodialysis done later in the day yesterday after catheter exchange required earlier for nonfunctioning catheter.   Objective:   BP 94/62 (BP Location: Right Arm)   Pulse 72   Temp 98 F (36.7 C) (Oral)   Resp 16   Ht 5\' 8"  (1.727 m)   Wt 60.5 kg   SpO2 93%   BMI 20.28 kg/m   Intake/Output Summary (Last 24 hours) at 12/25/2020 0842 Last data filed at 12/25/2020 2725 Gross per 24 hour  Intake --  Output 900 ml  Net -900 ml   Weight change: -0.4 kg  Physical Exam: Gen: Resting comfortably in bed, wife and daughter at bedside. CVS: Pulse regular rhythm, normal rate, S1 and S2 normal Resp: Anterior chest clear to auscultation bilaterally without rales/rhonchi. Temporary LIJ HD catheter Abd: Soft, flat, nontender, bowel sounds are normal Ext: No palpable lower  extremity edema  Imaging: DG CHEST PORT 1 VIEW  Result Date: 12/24/2020 CLINICAL DATA:  Central line placement. EXAM: PORTABLE CHEST 1 VIEW COMPARISON:  Dec 17, 2020. FINDINGS: Stable cardiomegaly. Status post coronary bypass graft. Stable position stent graft involving descending thoracic aorta. Interval placement of left internal jugular dialysis catheter with distal tip in expected position of cavoatrial junction. No pneumothorax is noted. Mild bibasilar atelectasis or infiltrates are noted. Bony thorax is unremarkable. IMPRESSION: Interval placement of left internal jugular dialysis catheter with distal tip in expected position of cavoatrial junction. Mild bibasilar atelectasis or infiltrates are noted. Electronically Signed   By: Marijo Conception M.D.   On: 12/24/2020 15:19    Labs: BMET Recent Labs  Lab 12/19/20 0052 12/20/20 0716 12/21/20 0506 12/22/20 0412 12/23/20 0712 12/24/20 0202 12/25/20 0229  NA 137 139 140 139 141 140 138  K 3.6 3.5 3.0* 3.3* 3.8 3.5 3.7  CL 99 105 103 101 106 107 100  CO2 22 22 27 25 24  21* 26  GLUCOSE 98 107* 98 95 91 93 97  BUN 111* 108* 64* 74* 82* 85* 31*  CREATININE 7.94* 7.44* 4.61* 5.39* 5.80* 6.12* 3.23*  CALCIUM 8.3* 7.9* 8.5* 8.5* 8.4* 8.2* 8.1*  PHOS 6.9*  --  3.8 4.5 5.1* 5.9* 3.4   CBC Recent Labs  Lab 12/20/20 0112 12/21/20 0506 12/23/20 0712  WBC 10.0 9.1 8.5  HGB 8.6* 7.9* 8.1*  HCT 26.8* 24.4* 26.3*  MCV 95.0 93.8 97.4  PLT 328 267 323    Medications:    . (feeding supplement) PROSource Plus  30 mL Oral BID BM  . aspirin  81 mg Oral QPC supper  . atorvastatin  20 mg Oral Daily  . Chlorhexidine Gluconate Cloth  6 each Topical Daily  . feeding supplement  1 Container Oral TID BM  . ferrous sulfate  325 mg Oral Q breakfast  . heparin injection (subcutaneous)  5,000 Units Subcutaneous Q8H  . midodrine  10 mg Oral TID WC  . polyethylene glycol  17 g Oral Daily  . senna-docusate  1 tablet Oral BID   Elmarie Shiley,  MD 12/25/2020, 8:42 AM

## 2020-12-26 DIAGNOSIS — J9601 Acute respiratory failure with hypoxia: Secondary | ICD-10-CM | POA: Diagnosis not present

## 2020-12-26 DIAGNOSIS — N179 Acute kidney failure, unspecified: Secondary | ICD-10-CM | POA: Diagnosis not present

## 2020-12-26 LAB — RENAL FUNCTION PANEL
Albumin: 2.2 g/dL — ABNORMAL LOW (ref 3.5–5.0)
Anion gap: 10 (ref 5–15)
BUN: 47 mg/dL — ABNORMAL HIGH (ref 8–23)
CO2: 27 mmol/L (ref 22–32)
Calcium: 8.1 mg/dL — ABNORMAL LOW (ref 8.9–10.3)
Chloride: 100 mmol/L (ref 98–111)
Creatinine, Ser: 4.53 mg/dL — ABNORMAL HIGH (ref 0.61–1.24)
GFR, Estimated: 12 mL/min — ABNORMAL LOW
Glucose, Bld: 92 mg/dL (ref 70–99)
Phosphorus: 5.8 mg/dL — ABNORMAL HIGH (ref 2.5–4.6)
Potassium: 3.7 mmol/L (ref 3.5–5.1)
Sodium: 137 mmol/L (ref 135–145)

## 2020-12-26 MED ORDER — PHENOL 1.4 % MT LIQD: 1.0000 | OROMUCOSAL | Status: DC | PRN

## 2020-12-26 MED ORDER — POLYETHYLENE GLYCOL 3350 17 G PO PACK: 17.0000 g | PACK | Freq: Every day | ORAL | Status: DC | PRN

## 2020-12-26 NOTE — Progress Notes (Signed)
Patient ID: Andrew [name], male   DOB: [birth year], 87 y.o.   MRN: [id number] [facility] Progress Note   Assessment/ Plan:   1. Acute kidney Injury: Nonoliguric, 900 cc urine output overnight.  Renal injury from volume depletion (with likely ischemic ATN) and obstruction with urine retention.  Labs this morning showing high BUN/creatinine and not indicative of obvious renal recovery.  The plan is to monitor his labs daily and undertake dialysis as needed as we monitor for continued renal recovery.  His last available creatinine was 1.1 in August, 2021. 2.  Anemia: Secondary to iron deficiency/acute illness.  On oral iron and status post ESA.  3.  Acute hypoxic respiratory failure: Associated with community-acquired pneumonia for which he has completed his course of azithromycin/ceftriaxone and remains afebrile/without leukocytosis.  Remains on oxygen via nasal cannula. 4.  Hyperphosphatemia: Fluctuating with hemodialysis/renal function, will keep him on a low-dose binder. 5.  Hypotension: He remains on scheduled midodrine for blood pressure support, not getting ultrafiltration with dialysis.  Subjective:   Reports that he slept better overnight and currently feels well/denies any complaints.   Objective:   BP (!) 99/56 (BP Location: Right Arm)   Pulse 60   Temp 98.5 F (36.9 C) (Oral)   Resp 18   Ht 5\' 8"  (1.727 m)   Wt 65 kg   SpO2 96%   BMI 21.79 kg/m   Intake/Output Summary (Last 24 hours) at 12/26/2020 5427 Last data filed at 12/26/2020 0520 Gross per 24 hour  Intake 200 ml  Output 900 ml  Net -700 ml   Weight change: 4.3 kg  Physical Exam: Gen: Resting comfortably in bed, wife and daughter at bedside. CVS: Pulse regular rhythm, normal rate, S1 and S2 normal Resp: Anterior chest clear to auscultation bilaterally without rales/rhonchi. Temporary LIJ HD catheter Abd: Soft, flat, nontender, bowel sounds are normal Ext: No palpable lower extremity  edema  Imaging: DG CHEST PORT 1 VIEW  Result Date: 12/24/2020 CLINICAL DATA:  Central line placement. EXAM: PORTABLE CHEST 1 VIEW COMPARISON:  Dec 17, 2020. FINDINGS: Stable cardiomegaly. Status post coronary bypass graft. Stable position stent graft involving descending thoracic aorta. Interval placement of left internal jugular dialysis catheter with distal tip in expected position of cavoatrial junction. No pneumothorax is noted. Mild bibasilar atelectasis or infiltrates are noted. Bony thorax is unremarkable. IMPRESSION: Interval placement of left internal jugular dialysis catheter with distal tip in expected position of cavoatrial junction. Mild bibasilar atelectasis or infiltrates are noted. Electronically Signed   By: Marijo Conception M.D.   On: 12/24/2020 15:19    Labs: BMET Recent Labs  Lab 12/20/20 0716 12/21/20 0506 12/22/20 0412 12/23/20 0712 12/24/20 0202 12/25/20 0229 12/26/20 0227  NA 139 140 139 141 140 138 137  K 3.5 3.0* 3.3* 3.8 3.5 3.7 3.7  CL 105 103 101 106 107 100 100  CO2 22 27 25 24  21* 26 27  GLUCOSE 107* 98 95 91 93 97 92  BUN 108* 64* 74* 82* 85* 31* 47*  CREATININE 7.44* 4.61* 5.39* 5.80* 6.12* 3.23* 4.53*  CALCIUM 7.9* 8.5* 8.5* 8.4* 8.2* 8.1* 8.1*  PHOS  --  3.8 4.5 5.1* 5.9* 3.4 5.8*   CBC Recent Labs  Lab 12/20/20 0112 12/21/20 0506 12/23/20 0712  WBC 10.0 9.1 8.5  HGB 8.6* 7.9* 8.1*  HCT 26.8* 24.4* 26.3*  MCV 95.0 93.8 97.4  PLT 328 267 323    Medications:    . (feeding supplement) PROSource Plus  30 mL Oral BID BM  . aspirin  81 mg Oral QPC supper  . atorvastatin  20 mg Oral Daily  . Chlorhexidine Gluconate Cloth  6 each Topical Daily  . feeding supplement  1 Container Oral TID BM  . ferrous sulfate  325 mg Oral Q breakfast  . heparin injection (subcutaneous)  5,000 Units Subcutaneous Q8H  . midodrine  10 mg Oral TID WC  . polyethylene glycol  17 g Oral Daily  . senna-docusate  1 tablet Oral BID   Elmarie Shiley, MD 12/26/2020, 7:27  AM

## 2020-12-26 NOTE — Progress Notes (Signed)
PROGRESS NOTE    Andrew [name]  AUQ:[id number] DOB: 05/10/1932 DOA: 12/10/2020 PCP: [name], MD    Brief Narrative:  87 y.o.malewith a history of thoracic aortic aneurysm status post stent graft repair, AAA, CABG in 2010, chronic HFpEF, BPH with urinary retention, CKDII,and remote atrial fibrillation not anticoagulated who presented to the ED 5/20 prompted by PCP for abnormal blood work which was drawn in the setting of cough that hadn't improved with augmentin. He also had been losing appetite and feeling unwell.  Labs were notable for mild leukocytosis, worsening anemia, renal failure. CT the chest, abdomen, and pelvis is notable for new left upper lobe infiltrate, unchanged stent graft repair of thoracic aortic aneurysm, and grossly stable infrarenal AAA. Patient was treated with rocephin, azithromycin, albuterol, and IV fluids in the ED. SBP nadir was in 70's shortly after admission. Creatinine was 8.71 worsening to 9.32 from previous of 1.09 in Aug 2021. Nephrology was consulted. Further goals of care discussions have continued. The patient and family now confirm their desire for trial of hemodialysis if there continues to be no renal recovery. Trialysis catheter was placed 5/30 with first dialysis session alter that day.   Assessment & Plan:   Principal Problem:   AKI (acute kidney injury) ([place]) Active Problems:   Thoracic aortic aneurysm without rupture ([place])   Coronary artery disease   Urinary retention   AAA (abdominal aortic aneurysm) without rupture ([facility])   Essential hypertension   Normocytic anemia   Chronic diastolic CHF (congestive heart failure) ([place])   Community acquired pneumonia  Acute renal failure superimposed on CKD II; uremia; metabolic acidosis: Initial BUN 161, Cr 8.71 (44 &1.09 in Aug 2021) with serum bicarb of 17. Kidneys appear normal with no obstruction on CT in ED.   -Nephrology following. Trialysis catheter was placed 5/30, first dialysis  5/30. Having some hypotension afterward. - Pt had been continued on midodrine. - Continued with foley catheter for recurrent urinary retention - on 6/3 had HD cath replaced secondary to malfunction, since then tolerating HD -Cr of 4.53 today -Per Nephrology, plan to monitor over weekend and f/u afterwards if pt needs longer-term HD  Hypokalemia:  - currently stable -repeat lytes in AM  Hypomagnesemia:  -most recently 2.0 on 5/28  Hyperphosphatemia:  - Cont with renal diet, calcium carbonate w/meals. - phos of 5.8 today  Acute hypoxic respiratory failure: Due to CAP, possible aspiration, possible renal failure-related volume overload.  - Continue supplemental oxygen to maintain SpO2 >90% with normal WOB. CXR 5/27 personally reviewed showing very mild interstitial prominence and bibasilar faint opacities consistent with atelectasis.  -stable this AM  LUL CAP: Has completed 5 days of ceftriaxone, azithromycin. Remains afebrile without leukocytosis. -on minimal O2 support  Hypotension: Was improving til dialysis. - Now on midodrine -Recently received gentle hydration for hypotension  Urinary retention: Chronic, BPH.  --continued with foley cath  CAD: History of PCI and CABG 2010. No anginal complaints currently.  -Continue with ASA, statin  Chronic HFpEF: Echo 5/26 unchanged from prior with LVEF 60-65%, unevaluated diastolic function, no RWMAs. Normal RV, no severe valvular disease.  - Undergoing HD per Nephrology recs  Thoracic aortic aneurysm s/p repair:  -Remains stable at this time  AAA: Appears stable on CT in ED -Plan to keep VVS appointment with repeat U/S on 6/21 with Dr. Carlis Abbott.   Iron deficiency anemia: No acute bleeding noted. Hgb rebounding s/p aranesppernephrology. - Remains hemodynamically stable - Continue with daily iron supplement.  DVT  prophylaxis: heparin subq Code Status: Full Family Communication: Pt in room, pt's wife at  bedside  Status is: Inpatient  Remains inpatient appropriate because:Inpatient level of care appropriate due to severity of illness  Dispo: The patient is from: Home              Anticipated d/c is to: Unclear at this time              Patient currently is not medically stable to d/c.   Difficult to place patient No   Consultants:   Nephrology  Palliative Care  Procedures:     Antimicrobials: Anti-infectives (From admission, onward)   Start     Dose/Rate Route Frequency Ordered Stop   12/11/20 2000  cefTRIAXone (ROCEPHIN) 2 g in sodium chloride 0.9 % 100 mL IVPB        2 g 200 mL/hr over 30 Minutes Intravenous Every 24 hours 12/10/20 2308 12/14/20 2321   12/11/20 2000  azithromycin (ZITHROMAX) 500 mg in sodium chloride 0.9 % 250 mL IVPB        500 mg 250 mL/hr over 60 Minutes Intravenous Every 24 hours 12/10/20 2308 12/14/20 2133   12/10/20 1945  cefTRIAXone (ROCEPHIN) 1 g in sodium chloride 0.9 % 100 mL IVPB        1 g 200 mL/hr over 30 Minutes Intravenous  Once 12/10/20 1940 12/10/20 2037   12/10/20 1945  azithromycin (ZITHROMAX) 500 mg in sodium chloride 0.9 % 250 mL IVPB        500 mg 250 mL/hr over 60 Minutes Intravenous  Once 12/10/20 1940 12/10/20 2142      Subjective: Without complaints  Objective: Vitals:   12/25/20 2344 12/26/20 0441 12/26/20 0809 12/26/20 1215  BP: 111/71 (!) 99/56 (!) 83/52 112/69  Pulse: 62 60 65 61  Resp: 13 18 15 18   Temp: 98.1 F (36.7 C) 98.5 F (36.9 C) 98.6 F (37 C) 97.8 F (36.6 C)  TempSrc: Oral Oral Oral Axillary  SpO2:  96% 98% 100%  Weight:  65 kg    Height:        Intake/Output Summary (Last 24 hours) at 12/26/2020 1535 Last data filed at 12/26/2020 0520 Gross per 24 hour  Intake --  Output 550 ml  Net -550 ml   Filed Weights   12/24/20 1618 12/24/20 1923 12/26/20 0441  Weight: 60.7 kg 60.5 kg 65 kg    Examination: General exam: Awake, laying in bed, in nad Respiratory system: Normal respiratory effort, no  wheezing Cardiovascular system: regular rate, s1, s2 Gastrointestinal system: Soft, nondistended, positive BS Central nervous system: CN2-12 grossly intact, strength intact Extremities: Perfused, no clubbing Skin: Normal skin turgor, no notable skin lesions seen Psychiatry: Mood normal // no visual hallucinations   Data Reviewed: I have personally reviewed following labs and imaging studies  CBC: Recent Labs  Lab 12/20/20 0112 12/21/20 0506 12/23/20 0712  WBC 10.0 9.1 8.5  HGB 8.6* 7.9* 8.1*  HCT 26.8* 24.4* 26.3*  MCV 95.0 93.8 97.4  PLT 328 267 852   Basic Metabolic Panel: Recent Labs  Lab 12/22/20 0412 12/23/20 0712 12/24/20 0202 12/25/20 0229 12/26/20 0227  NA 139 141 140 138 137  K 3.3* 3.8 3.5 3.7 3.7  CL 101 106 107 100 100  CO2 25 24 21* 26 27  GLUCOSE 95 91 93 97 92  BUN 74* 82* 85* 31* 47*  CREATININE 5.39* 5.80* 6.12* 3.23* 4.53*  CALCIUM 8.5* 8.4* 8.2* 8.1* 8.1*  PHOS 4.5  5.1* 5.9* 3.4 5.8*   GFR: Estimated Creatinine Clearance: 10.8 mL/min (A) (by C-G formula based on SCr of 4.53 mg/dL (H)). Liver Function Tests: Recent Labs  Lab 12/22/20 0412 12/23/20 0712 12/24/20 0202 12/25/20 0229 12/26/20 0227  ALBUMIN 2.1* 2.2* 2.3* 2.3* 2.2*   No results for input(s): LIPASE, AMYLASE in the last 168 hours. No results for input(s): AMMONIA in the last 168 hours. Coagulation Profile: No results for input(s): INR, PROTIME in the last 168 hours. Cardiac Enzymes: No results for input(s): CKTOTAL, CKMB, CKMBINDEX, TROPONINI in the last 168 hours. BNP (last 3 results) Recent Labs    01/09/20 1443  PROBNP 1,018*   HbA1C: No results for input(s): HGBA1C in the last 72 hours. CBG: Recent Labs  Lab 12/19/20 1926  GLUCAP 162*   Lipid Profile: No results for input(s): CHOL, HDL, LDLCALC, TRIG, CHOLHDL, LDLDIRECT in the last 72 hours. Thyroid Function Tests: No results for input(s): TSH, T4TOTAL, FREET4, T3FREE, THYROIDAB in the last 72 hours. Anemia  Panel: No results for input(s): VITAMINB12, FOLATE, FERRITIN, TIBC, IRON, RETICCTPCT in the last 72 hours. Sepsis Labs: No results for input(s): PROCALCITON, LATICACIDVEN in the last 168 hours.  No results found for this or any previous visit (from the past 240 hour(s)).   Radiology Studies: No results found.  Scheduled Meds: . (feeding supplement) PROSource Plus  30 mL Oral BID BM  . aspirin  81 mg Oral QPC supper  . atorvastatin  20 mg Oral Daily  . Chlorhexidine Gluconate Cloth  6 each Topical Daily  . feeding supplement  1 Container Oral TID BM  . ferrous sulfate  325 mg Oral Q breakfast  . heparin injection (subcutaneous)  5,000 Units Subcutaneous Q8H  . midodrine  10 mg Oral TID WC  . senna-docusate  1 tablet Oral BID   Continuous Infusions:   LOS: 16 days   Marylu Lund, MD [facility] Pager On Amion  If 7PM-7AM, please contact night-coverage 12/26/2020, 3:35 PM

## 2020-12-26 NOTE — Plan of Care (Signed)

## 2020-12-27 ENCOUNTER — Encounter (HOSPITAL_COMMUNITY): Payer: Self-pay | Admitting: Pulmonary Disease

## 2020-12-27 DIAGNOSIS — I5032 Chronic diastolic (congestive) heart failure: Secondary | ICD-10-CM | POA: Diagnosis not present

## 2020-12-27 DIAGNOSIS — N179 Acute kidney failure, unspecified: Secondary | ICD-10-CM | POA: Diagnosis not present

## 2020-12-27 DIAGNOSIS — J9601 Acute respiratory failure with hypoxia: Secondary | ICD-10-CM | POA: Diagnosis not present

## 2020-12-27 LAB — RENAL FUNCTION PANEL
Albumin: 2.4 g/dL — ABNORMAL LOW (ref 3.5–5.0)
Anion gap: 13 (ref 5–15)
BUN: 57 mg/dL — ABNORMAL HIGH (ref 8–23)
CO2: 26 mmol/L (ref 22–32)
Calcium: 7.9 mg/dL — ABNORMAL LOW (ref 8.9–10.3)
Chloride: 97 mmol/L — ABNORMAL LOW (ref 98–111)
Creatinine, Ser: 5.25 mg/dL — ABNORMAL HIGH (ref 0.61–1.24)
GFR, Estimated: 10 mL/min — ABNORMAL LOW (ref >60)
Glucose, Bld: 93 mg/dL (ref 70–99)
Phosphorus: 7 mg/dL — ABNORMAL HIGH (ref 2.5–4.6)
Potassium: 3.1 mmol/L — ABNORMAL LOW (ref 3.5–5.1)
Sodium: 136 mmol/L (ref 135–145)

## 2020-12-27 MED ORDER — POTASSIUM CHLORIDE CRYS ER 20 MEQ PO TBCR
60.0000 meq | EXTENDED_RELEASE_TABLET | Freq: Once | ORAL | Status: AC
  Administered 2020-12-27: 60 meq via ORAL
  Filled 2020-12-27: qty 3

## 2020-12-27 NOTE — Progress Notes (Signed)
Patient ID: Andrew [name], male   DOB: 05/21/1932, 87 y.o.   MRN: [id number] S: No events overnight.  No complaints this morning. O:BP 106/65   Pulse 70   Temp 98.2 F (36.8 C) (Oral)   Resp 11   Ht 5\' 8"  (1.727 m)   Wt 65 kg   SpO2 99%   BMI 21.79 kg/m   Intake/Output Summary (Last 24 hours) at 12/27/2020 1117 Last data filed at 12/27/2020 0339 Gross per 24 hour  Intake --  Output 70 ml  Net -70 ml   Intake/Output: I/O last 3 completed shifts: In: -  Out: 370 [Urine:370]  Intake/Output this shift:  No intake/output data recorded. Weight change:  Gen: frail elderly WM in NAD CVS: RRR Resp: CTA Abd: +BS, soft, NT/ND Ext: no edema  Recent Labs  Lab 12/21/20 0506 12/22/20 0412 12/23/20 0712 12/24/20 0202 12/25/20 0229 12/26/20 0227 12/27/20 0439  NA 140 139 141 140 138 137 136  K 3.0* 3.3* 3.8 3.5 3.7 3.7 3.1*  CL 103 101 106 107 100 100 97*  CO2 27 25 24  21* 26 27 26   GLUCOSE 98 95 91 93 97 92 93  BUN 64* 74* 82* 85* 31* 47* 57*  CREATININE 4.61* 5.39* 5.80* 6.12* 3.23* 4.53* 5.25*  ALBUMIN 2.0* 2.1* 2.2* 2.3* 2.3* 2.2* 2.4*  CALCIUM 8.5* 8.5* 8.4* 8.2* 8.1* 8.1* 7.9*  PHOS 3.8 4.5 5.1* 5.9* 3.4 5.8* 7.0*   Liver Function Tests: Recent Labs  Lab 12/25/20 0229 12/26/20 0227 12/27/20 0439  ALBUMIN 2.3* 2.2* 2.4*   No results for input(s): LIPASE, AMYLASE in the last 168 hours. No results for input(s): AMMONIA in the last 168 hours. CBC: Recent Labs  Lab 12/21/20 0506 12/23/20 0712  WBC 9.1 8.5  HGB 7.9* 8.1*  HCT 24.4* 26.3*  MCV 93.8 97.4  PLT 267 323   Cardiac Enzymes: No results for input(s): CKTOTAL, CKMB, CKMBINDEX, TROPONINI in the last 168 hours. CBG: No results for input(s): GLUCAP in the last 168 hours.  Iron Studies: No results for input(s): IRON, TIBC, TRANSFERRIN, FERRITIN in the last 72 hours. Studies/Results: No results found. . (feeding supplement) PROSource Plus  30 mL Oral BID BM  . aspirin  81 mg Oral QPC supper  .  atorvastatin  20 mg Oral Daily  . Chlorhexidine Gluconate Cloth  6 each Topical Daily  . feeding supplement  1 Container Oral TID BM  . ferrous sulfate  325 mg Oral Q breakfast  . heparin injection (subcutaneous)  5,000 Units Subcutaneous Q8H  . midodrine  10 mg Oral TID WC  . senna-docusate  1 tablet Oral BID    BMET    Component Value Date/Time   NA 136 12/27/2020 0439   NA 139 03/18/2020 1456   K 3.1 (L) 12/27/2020 0439   CL 97 (L) 12/27/2020 0439   CO2 26 12/27/2020 0439   GLUCOSE 93 12/27/2020 0439   BUN 57 (H) 12/27/2020 0439   BUN 44 (H) 03/18/2020 1456   CREATININE 5.25 (H) 12/27/2020 0439   CREATININE 1.32 (H) 08/19/2019 1358   CALCIUM 7.9 (L) 12/27/2020 0439   GFRNONAA 10 (L) 12/27/2020 0439   GFRAA 71 03/18/2020 1456   CBC    Component Value Date/Time   WBC 8.5 12/23/2020 0712   RBC 2.70 (L) 12/23/2020 0712   HGB 8.1 (L) 12/23/2020 0712   HCT 26.3 (L) 12/23/2020 0712   PLT 323 12/23/2020 0712   MCV 97.4 12/23/2020 0712   MCH  30.0 12/23/2020 0712   MCHC 30.8 12/23/2020 0712   RDW 14.4 12/23/2020 0712   LYMPHSABS 0.2 (L) 12/10/2020 1713   MONOABS 0.8 12/10/2020 1713   EOSABS 0.5 12/10/2020 1713   BASOSABS 0.1 12/10/2020 1713     Assessment/Plan:  1. AKI, non-oliguric - Presumably due to ischemic ATN in setting of hypotension and volume depletion.  Started on HD 12/20/20 and last HD on 12/24/20 with increased UOP up to 900 cc's for the last 2 days.  BUN/Cr slowly rising.  Continue to hold off on further HD for now and follow.   1. Non-tunneled HD catheter placed by IR on 12/20/20 2. Anemia - s/p ESA, transfuse prn 3. Acute hypoxic respiratory failure - associated with CAP.   4. CAP - resolved with abx  5. Hypotension - currently on midodrine.  Donetta Potts, MD [employer] [phone]

## 2020-12-27 NOTE — Progress Notes (Addendum)
Physical Therapy Treatment Patient Details Name: Andrew [name] MRN: [id number] DOB: November 15, 1931 Today's Date: 12/27/2020    History of Present Illness 87 y.o. male presented 12/10/20 to the emergency department for evaluation of abnormal blood work. Acute renal failure with uremia;  CT the chest, abdomen, and pelvis is notable for pneumonia. Pt had temporary HD catheter placed 5/30 in preparation for HD. PMH- thoracic aortic aneurysm status post stent graft repair, CABG in 2010, AAA, chronic diastolic CHF, BPH, urinary retention, CKD II, and remote atrial fibrillation not anticoagulated, back surgery    PT Comments    Pt admitted with above diagnosis. Pt limited as he continues to have incontinence during sessions.  PT had to clean pt in bed prior to getting him up. Once pt was cleaned, he sat EOB with min assist and was able to stand with min assist to [facility] and be moved to chair. Pt very fearful of falling off and on during session and is max assist when his fear increases. Pt will push posteriorly and need incr assist. Instructed wife to cue pt to do LE exercises multiple x throughout day. Met 0/4 goals.  Revised goals.  Pt currently with functional limitations due to balance and endurance deficits. Pt will benefit from skilled PT to increase their independence and safety with mobility to allow discharge to the venue listed below.     Follow Up Recommendations  SNF     Equipment Recommendations  Wheelchair (measurements PT);Wheelchair cushion (measurements PT);Hospital bed    Recommendations for Other Services OT consult     Precautions / Restrictions Precautions Precautions: Fall Precaution Comments: HD port Restrictions Weight Bearing Restrictions: No    Mobility  Bed Mobility Overal bed mobility: Needs Assistance Bed Mobility: Sidelying to Sit;Rolling;Sit to Supine Rolling: Max assist Sidelying to sit: Max assist;HOB elevated Supine to sit: Max assist Sit to supine: Max  assist   General bed mobility comments: Assist to advance BLE toward EOB and elevate trunk. Patient completes ~25% of the task. Pt has fear of falling and works against therapy with coming to EOB at times.  Once PT sat pt up the first time, pt with large BM therefore laid pt back down and cleaned him. Then sat him up again and this time he did sit better once at EOB and was not leaning posteriorly like the first attempts.    Transfers Overall transfer level: Needs assistance   Transfers: Sit to/from Stand Sit to Stand: +2 physical assistance;Min assist;From elevated surface Stand pivot transfers: Total assist (in stedy)       General transfer comment: min assist with use of bed pad to bring hips up. Pt able to fully stand. Needs tactile cues and assist for full upright standing and cannot stand for long periods of time.  Stood to [employer] x 2 and moved pt to recliner.  Used Stedy for bed to chair  Ambulation/Gait                 Marine scientist [place] (Stroke Patients Only)       Balance Overall balance assessment: Needs assistance Sitting-balance support: Bilateral upper extremity supported;Feet supported Sitting balance-Leahy Scale: Poor Sitting balance - Comments: fluctuating from requiring min-mod A for countering posterior lean with onset of fatigue Postural control: Posterior lean Standing balance support: Bilateral upper extremity supported Standing balance-Leahy Scale: Poor Standing balance comment: Able to maintain static standing  in [place] with Min A.                            Cognition Arousal/Alertness: Awake/alert Behavior During Therapy: Flat affect Overall Cognitive Status: Impaired/Different from baseline Area of Impairment: Attention;Following commands;Problem solving;Safety/judgement                     Memory: Decreased short-term memory Following Commands: Follows one step commands with  increased time;Follows one step commands inconsistently Safety/Judgement: Decreased awareness of deficits   Problem Solving: Slow processing;Decreased initiation;Requires verbal cues;Requires tactile cues;Difficulty sequencing General Comments: Patient able to make needs known. Often reports need for BM with movement.      Exercises General Exercises - Lower Extremity Long Arc Quad: AROM;Both;Seated;10 reps Hip Flexion/Marching: AROM;Both;Seated;10 reps    General Comments General comments (skin integrity, edema, etc.): 89/62 initial BP, 102/66 sitting, 67-80 bpm, 99% oon 3L at rest, desat to 92% wiht activity on 3L      Pertinent Vitals/Pain      Home Living                      Prior Function            PT Goals (current goals can now be found in the care plan section) Acute Rehab PT Goals Patient Stated Goal: To be finished with dialysis PT Goal Formulation: With patient Time For Goal Achievement: 01/10/21 Potential to Achieve Goals: Good Progress towards PT goals: Progressing toward goals    Frequency    Min 2X/week      PT Plan Current plan remains appropriate    Co-evaluation              AM-PAC PT "6 Clicks" Mobility   Outcome Measure  Help needed turning from your back to your side while in a flat bed without using bedrails?: A Lot Help needed moving from lying on your back to sitting on the side of a flat bed without using bedrails?: A Lot Help needed moving to and from a bed to a chair (including a wheelchair)?: A Lot Help needed standing up from a chair using your arms (e.g., wheelchair or bedside chair)?: A Lot Help needed to walk in hospital room?: Total Help needed climbing 3-5 steps with a railing? : Total 6 Click Score: 10    End of Session Equipment Utilized During Treatment: Gait belt;Oxygen Activity Tolerance: Patient limited by fatigue Patient left: with call bell/phone within reach;with family/visitor present;in chair;with  chair alarm set Nurse Communication: Mobility status;Need for lift equipment Charlaine Dalton) PT Visit Diagnosis: Muscle weakness (generalized) (M62.81);Difficulty in walking, not elsewhere classified (R26.2)     Time: 7622-6333 PT Time Calculation (min) (ACUTE ONLY): 46 min  Charges:  $Therapeutic Exercise: 8-22 mins $Therapeutic Activity: 8-22 mins $Self Care/Home Management: 8-22                     [facility] M,PT Acute Rehab Services [phone] (pager)   Alvira Philips 12/27/2020, 3:28 PM

## 2020-12-27 NOTE — Progress Notes (Signed)
PROGRESS NOTE    Andrew [name]  ZWC:[id number] DOB: 18-Mar-1932 DOA: 12/10/2020 PCP: [name], MD    Brief Narrative:  87 y.o.malewith a history of thoracic aortic aneurysm status post stent graft repair, AAA, CABG in 2010, chronic HFpEF, BPH with urinary retention, CKDII,and remote atrial fibrillation not anticoagulated who presented to the ED 5/20 prompted by PCP for abnormal blood work which was drawn in the setting of cough that hadn't improved with augmentin. He also had been losing appetite and feeling unwell.  Labs were notable for mild leukocytosis, worsening anemia, renal failure. CT the chest, abdomen, and pelvis is notable for new left upper lobe infiltrate, unchanged stent graft repair of thoracic aortic aneurysm, and grossly stable infrarenal AAA. Patient was treated with rocephin, azithromycin, albuterol, and IV fluids in the ED. SBP nadir was in 70's shortly after admission. Creatinine was 8.71 worsening to 9.32 from previous of 1.09 in Aug 2021. Nephrology was consulted. Further goals of care discussions have continued. The patient and family now confirm their desire for trial of hemodialysis if there continues to be no renal recovery. Trialysis catheter was placed 5/30 with first dialysis session alter that day.   Assessment & Plan:   Principal Problem:   AKI (acute kidney injury) ([place]) Active Problems:   Thoracic aortic aneurysm without rupture ([place])   Coronary artery disease   Urinary retention   AAA (abdominal aortic aneurysm) without rupture (HCC)   Essential hypertension   Normocytic anemia   Chronic diastolic CHF (congestive heart failure) ([place])   Community acquired pneumonia  Acute renal failure superimposed on CKD II; uremia; metabolic acidosis: Initial BUN 161, Cr 8.71 (44 &1.09 in Aug 2021) with serum bicarb of 17. Kidneys appear normal with no obstruction on CT in ED.   -Nephrology following. Trialysis catheter was placed 5/30, first dialysis  5/30. Having some hypotension afterward. - Pt had been continued on midodrine. - Continued with foley catheter for recurrent urinary retention - on 6/3 had HD cath replaced secondary to malfunction, since then tolerating HD -Cr of 5.25 today -Per Nephrology, will cont to monitor for now for the need for long term HD. Will f/u on Nephro recs  Hypokalemia:  - currently stable -recheck bmet in AM  Hypomagnesemia:  -most recently 2.0 on 5/28  Hyperphosphatemia:  - Cont with renal diet, calcium carbonate w/meals. - phos of 7.0 today  Acute hypoxic respiratory failure: Due to CAP, possible aspiration, possible renal failure-related volume overload.  - Continue supplemental oxygen to maintain SpO2 >90% with normal WOB. CXR 5/27 personally reviewed showing very mild interstitial prominence and bibasilar faint opacities consistent with atelectasis.  -Remains stable this AM  LUL CAP: Has completed 5 days of ceftriaxone, azithromycin. Remains afebrile without leukocytosis. -Continued on minimal O2 support  Hypotension: Was improving til dialysis. - Improved with midodrine  Urinary retention: Chronic, BPH.  --continued with foley cath  CAD: History of PCI and CABG 2010. No anginal complaints currently.  -Continue with ASA, statin  Chronic HFpEF: Echo 5/26 unchanged from prior with LVEF 60-65%, unevaluated diastolic function, no RWMAs. Normal RV, no severe valvular disease.  - had recently undergone HD per nephrology, will defer to Nephrology  Thoracic aortic aneurysm s/p repair:  -Remains stable at this time  AAA: Appears stable on CT in ED -Plan to keep VVS appointment with repeat U/S on 6/21 with Dr. Carlis Abbott.   Iron deficiency anemia: No acute bleeding noted. Hgb rebounding s/p aranesppernephrology. - Continues to be hemodynamically stable -  Continue with daily iron supplement.  DVT prophylaxis: heparin subq Code Status: Full Family Communication: Pt in room,  pt's wife at bedside  Status is: Inpatient  Remains inpatient appropriate because:Inpatient level of care appropriate due to severity of illness  Dispo: The patient is from: Home              Anticipated d/c is to: Unclear at this time              Patient currently is not medically stable to d/c.   Difficult to place patient No   Consultants:   Nephrology  Palliative Care  Procedures:     Antimicrobials: Anti-infectives (From admission, onward)   Start     Dose/Rate Route Frequency Ordered Stop   12/11/20 2000  cefTRIAXone (ROCEPHIN) 2 g in sodium chloride 0.9 % 100 mL IVPB        2 g 200 mL/hr over 30 Minutes Intravenous Every 24 hours 12/10/20 2308 12/14/20 2321   12/11/20 2000  azithromycin (ZITHROMAX) 500 mg in sodium chloride 0.9 % 250 mL IVPB        500 mg 250 mL/hr over 60 Minutes Intravenous Every 24 hours 12/10/20 2308 12/14/20 2133   12/10/20 1945  cefTRIAXone (ROCEPHIN) 1 g in sodium chloride 0.9 % 100 mL IVPB        1 g 200 mL/hr over 30 Minutes Intravenous  Once 12/10/20 1940 12/10/20 2037   12/10/20 1945  azithromycin (ZITHROMAX) 500 mg in sodium chloride 0.9 % 250 mL IVPB        500 mg 250 mL/hr over 60 Minutes Intravenous  Once 12/10/20 1940 12/10/20 2142      Subjective: No complaints at this time  Objective: Vitals:   12/27/20 0737 12/27/20 1000 12/27/20 1200 12/27/20 1400  BP: (!) 74/45 106/65 (!) 89/62 103/66  Pulse: 68 70 (!) 54   Resp: 12 11 14 14   Temp: 98.2 F (36.8 C)     TempSrc: Oral     SpO2: 99% 99% 95%   Weight:      Height:        Intake/Output Summary (Last 24 hours) at 12/27/2020 1610 Last data filed at 12/27/2020 0930 Gross per 24 hour  Intake --  Output 520 ml  Net -520 ml   Filed Weights   12/24/20 1618 12/24/20 1923 12/26/20 0441  Weight: 60.7 kg 60.5 kg 65 kg    Examination: General exam: Conversant, in no acute distress Respiratory system: normal chest rise, clear, no audible wheezing Cardiovascular system:  regular rhythm, s1-s2 Gastrointestinal system: Nondistended, nontender, pos BS Central nervous system: No seizures, no tremors Extremities: No cyanosis, no joint deformities Skin: No rashes, no pallor Psychiatry: Affect normal // no auditory hallucinations   Data Reviewed: I have personally reviewed following labs and imaging studies  CBC: Recent Labs  Lab 12/21/20 0506 12/23/20 0712  WBC 9.1 8.5  HGB 7.9* 8.1*  HCT 24.4* 26.3*  MCV 93.8 97.4  PLT 267 675   Basic Metabolic Panel: Recent Labs  Lab 12/23/20 0712 12/24/20 0202 12/25/20 0229 12/26/20 0227 12/27/20 0439  NA 141 140 138 137 136  K 3.8 3.5 3.7 3.7 3.1*  CL 106 107 100 100 97*  CO2 24 21* 26 27 26   GLUCOSE 91 93 97 92 93  BUN 82* 85* 31* 47* 57*  CREATININE 5.80* 6.12* 3.23* 4.53* 5.25*  CALCIUM 8.4* 8.2* 8.1* 8.1* 7.9*  PHOS 5.1* 5.9* 3.4 5.8* 7.0*   GFR: Estimated Creatinine  Clearance: 9.3 mL/min (A) (by C-G formula based on SCr of 5.25 mg/dL (H)). Liver Function Tests: Recent Labs  Lab 12/23/20 0712 12/24/20 0202 12/25/20 0229 12/26/20 0227 12/27/20 0439  ALBUMIN 2.2* 2.3* 2.3* 2.2* 2.4*   No results for input(s): LIPASE, AMYLASE in the last 168 hours. No results for input(s): AMMONIA in the last 168 hours. Coagulation Profile: No results for input(s): INR, PROTIME in the last 168 hours. Cardiac Enzymes: No results for input(s): CKTOTAL, CKMB, CKMBINDEX, TROPONINI in the last 168 hours. BNP (last 3 results) Recent Labs    01/09/20 1443  PROBNP 1,018*   HbA1C: No results for input(s): HGBA1C in the last 72 hours. CBG: No results for input(s): GLUCAP in the last 168 hours. Lipid Profile: No results for input(s): CHOL, HDL, LDLCALC, TRIG, CHOLHDL, LDLDIRECT in the last 72 hours. Thyroid Function Tests: No results for input(s): TSH, T4TOTAL, FREET4, T3FREE, THYROIDAB in the last 72 hours. Anemia Panel: No results for input(s): VITAMINB12, FOLATE, FERRITIN, TIBC, IRON, RETICCTPCT in the  last 72 hours. Sepsis Labs: No results for input(s): PROCALCITON, LATICACIDVEN in the last 168 hours.  No results found for this or any previous visit (from the past 240 hour(s)).   Radiology Studies: No results found.  Scheduled Meds: . (feeding supplement) PROSource Plus  30 mL Oral BID BM  . aspirin  81 mg Oral QPC supper  . atorvastatin  20 mg Oral Daily  . Chlorhexidine Gluconate Cloth  6 each Topical Daily  . feeding supplement  1 Container Oral TID BM  . ferrous sulfate  325 mg Oral Q breakfast  . heparin injection (subcutaneous)  5,000 Units Subcutaneous Q8H  . midodrine  10 mg Oral TID WC  . senna-docusate  1 tablet Oral BID   Continuous Infusions:   LOS: 17 days   Marylu Lund, MD [facility] Pager On Amion  If 7PM-7AM, please contact night-coverage 12/27/2020, 4:10 PM

## 2020-12-28 DIAGNOSIS — J9601 Acute respiratory failure with hypoxia: Secondary | ICD-10-CM | POA: Diagnosis not present

## 2020-12-28 DIAGNOSIS — N179 Acute kidney failure, unspecified: Secondary | ICD-10-CM | POA: Diagnosis not present

## 2020-12-28 DIAGNOSIS — I5032 Chronic diastolic (congestive) heart failure: Secondary | ICD-10-CM | POA: Diagnosis not present

## 2020-12-28 LAB — RENAL FUNCTION PANEL
Albumin: 2.5 g/dL — ABNORMAL LOW (ref 3.5–5.0)
Anion gap: 15 (ref 5–15)
BUN: 69 mg/dL — ABNORMAL HIGH (ref 8–23)
CO2: 19 mmol/L — ABNORMAL LOW (ref 22–32)
Calcium: 7.8 mg/dL — ABNORMAL LOW (ref 8.9–10.3)
Chloride: 102 mmol/L (ref 98–111)
Creatinine, Ser: 5.85 mg/dL — ABNORMAL HIGH (ref 0.61–1.24)
GFR, Estimated: 9 mL/min — ABNORMAL LOW (ref >60)
Glucose, Bld: 82 mg/dL (ref 70–99)
Phosphorus: 7 mg/dL — ABNORMAL HIGH (ref 2.5–4.6)
Potassium: 4.2 mmol/L (ref 3.5–5.1)
Sodium: 136 mmol/L (ref 135–145)

## 2020-12-28 NOTE — Progress Notes (Signed)
PROGRESS NOTE    Andrew [name]  SFK:[id number] DOB: 10/08/1931 DOA: 12/10/2020 PCP: [name], MD    Brief Narrative:  87 y.o.malewith a history of thoracic aortic aneurysm status post stent graft repair, AAA, CABG in 2010, chronic HFpEF, BPH with urinary retention, CKDII,and remote atrial fibrillation not anticoagulated who presented to the ED 5/20 prompted by PCP for abnormal blood work which was drawn in the setting of cough that hadn't improved with augmentin. He also had been losing appetite and feeling unwell.  Labs were notable for mild leukocytosis, worsening anemia, renal failure. CT the chest, abdomen, and pelvis is notable for new left upper lobe infiltrate, unchanged stent graft repair of thoracic aortic aneurysm, and grossly stable infrarenal AAA. Patient was treated with rocephin, azithromycin, albuterol, and IV fluids in the ED. SBP nadir was in 70's shortly after admission. Creatinine was 8.71 worsening to 9.32 from previous of 1.09 in Aug 2021. Nephrology was consulted. Further goals of care discussions have continued. The patient and family now confirm their desire for trial of hemodialysis if there continues to be no renal recovery. Trialysis catheter was placed 5/30 with first dialysis session alter that day.   Assessment & Plan:   Principal Problem:   AKI (acute kidney injury) ([place]) Active Problems:   Thoracic aortic aneurysm without rupture ([place])   Coronary artery disease   Urinary retention   AAA (abdominal aortic aneurysm) without rupture (H[facility])   Essential hypertension   Normocytic anemia   Chronic diastolic CHF (congestive heart failure) ([place])   Community acquired pneumonia  Acute renal failure superimposed on CKD II; uremia; metabolic acidosis: Initial BUN 161, Cr 8.71 (44 &1.09 in Aug 2021) with serum bicarb of 17. Kidneys appear normal with no obstruction on CT in ED.   -Nephrology following. Trialysis catheter was placed 5/30, first dialysis  5/30. Having some hypotension afterward. - Pt had been continued on midodrine. - Continued with foley catheter for recurrent urinary retention - Pt has required intermittent HD thus far  -Cr of 5.85 today -Per Nephrology, will cont to monitor for now. Unclear yet for need for long term HD. Will f/u with Nephrology  Hypokalemia:  - currently stable -repeat bmet in AM  Hypomagnesemia:  -most recently 2.0 on 5/28  Hyperphosphatemia:  - Cont with renal diet, calcium carbonate w/meals. - phos remains 7.0 today  Acute hypoxic respiratory failure: Due to CAP, possible aspiration, possible renal failure-related volume overload.  - Continue supplemental oxygen to maintain SpO2 >90% with normal WOB. CXR 5/27 personally reviewed showing very mild interstitial prominence and bibasilar faint opacities consistent with atelectasis.  -stable on minimal O2 at this time  LUL CAP: Has completed 5 days of ceftriaxone, azithromycin. Remains afebrile without leukocytosis. -Continued on minimal O2 support  Hypotension: Was improving til dialysis. - Improved with midodrine  Urinary retention: Chronic, BPH.  --continued with foley cath  CAD: History of PCI and CABG 2010. No anginal complaints currently.  -Continue with ASA, statin  Chronic HFpEF: Echo 5/26 unchanged from prior with LVEF 60-65%, unevaluated diastolic function, no RWMAs. Normal RV, no severe valvular disease.  - Undergoing intermittent HD per Nephrology  Thoracic aortic aneurysm s/p repair:  -Remains stable at this time  AAA: Appears stable on CT in ED -Plan to keep VVS appointment with repeat U/S on 6/21 with Dr. Carlis Abbott.   Iron deficiency anemia: No acute bleeding noted. Hgb rebounding s/p aranesppernephrology. - Continues to be hemodynamically stable - Continue with daily iron supplement.  DVT prophylaxis: heparin subq Code Status: Full Family Communication: Pt in room, pt's wife at bedside  Status is:  Inpatient  Remains inpatient appropriate because:Inpatient level of care appropriate due to severity of illness  Dispo: The patient is from: Home              Anticipated d/c is to: Unclear at this time              Patient currently is not medically stable to d/c.   Difficult to place patient No   Consultants:   Nephrology  Palliative Care  Procedures:     Antimicrobials: Anti-infectives (From admission, onward)   Start     Dose/Rate Route Frequency Ordered Stop   12/11/20 2000  cefTRIAXone (ROCEPHIN) 2 g in sodium chloride 0.9 % 100 mL IVPB        2 g 200 mL/hr over 30 Minutes Intravenous Every 24 hours 12/10/20 2308 12/14/20 2321   12/11/20 2000  azithromycin (ZITHROMAX) 500 mg in sodium chloride 0.9 % 250 mL IVPB        500 mg 250 mL/hr over 60 Minutes Intravenous Every 24 hours 12/10/20 2308 12/14/20 2133   12/10/20 1945  cefTRIAXone (ROCEPHIN) 1 g in sodium chloride 0.9 % 100 mL IVPB        1 g 200 mL/hr over 30 Minutes Intravenous  Once 12/10/20 1940 12/10/20 2037   12/10/20 1945  azithromycin (ZITHROMAX) 500 mg in sodium chloride 0.9 % 250 mL IVPB        500 mg 250 mL/hr over 60 Minutes Intravenous  Once 12/10/20 1940 12/10/20 2142      Subjective: Very eager to be discharged soon  Objective: Vitals:   12/28/20 0722 12/28/20 1100 12/28/20 1119 12/28/20 1300  BP: (!) 100/48 (!) 101/48 (!) 101/48 108/66  Pulse: 67 69 70 67  Resp: 17 12 14 15   Temp: 97.8 F (36.6 C) 97.8 F (36.6 C) 98.1 F (36.7 C) 97.8 F (36.6 C)  TempSrc: Axillary Oral Oral Oral  SpO2: 100% 100% 100% 100%  Weight:      Height:        Intake/Output Summary (Last 24 hours) at 12/28/2020 1509 Last data filed at 12/27/2020 2038 Gross per 24 hour  Intake --  Output 750 ml  Net -750 ml   Filed Weights   12/24/20 1923 12/26/20 0441 12/28/20 0500  Weight: 60.5 kg 65 kg 64 kg    Examination: General exam: Awake, laying in bed, in nad Respiratory system: Normal respiratory effort, no  wheezing Cardiovascular system: regular rate, s1, s2 Gastrointestinal system: Soft, nondistended, positive BS Central nervous system: CN2-12 grossly intact, strength intact Extremities: Perfused, no clubbing Skin: Normal skin turgor, no notable skin lesions seen Psychiatry: Mood normal // no visual hallucinations   Data Reviewed: I have personally reviewed following labs and imaging studies  CBC: Recent Labs  Lab 12/23/20 0712  WBC 8.5  HGB 8.1*  HCT 26.3*  MCV 97.4  PLT 676   Basic Metabolic Panel: Recent Labs  Lab 12/24/20 0202 12/25/20 0229 12/26/20 0227 12/27/20 0439 12/28/20 0403  NA 140 138 137 136 136  K 3.5 3.7 3.7 3.1* 4.2  CL 107 100 100 97* 102  CO2 21* 26 27 26  19*  GLUCOSE 93 97 92 93 82  BUN 85* 31* 47* 57* 69*  CREATININE 6.12* 3.23* 4.53* 5.25* 5.85*  CALCIUM 8.2* 8.1* 8.1* 7.9* 7.8*  PHOS 5.9* 3.4 5.8* 7.0* 7.0*   GFR: Estimated Creatinine  Clearance: 8.2 mL/min (A) (by C-G formula based on SCr of 5.85 mg/dL (H)). Liver Function Tests: Recent Labs  Lab 12/24/20 0202 12/25/20 0229 12/26/20 0227 12/27/20 0439 12/28/20 0403  ALBUMIN 2.3* 2.3* 2.2* 2.4* 2.5*   No results for input(s): LIPASE, AMYLASE in the last 168 hours. No results for input(s): AMMONIA in the last 168 hours. Coagulation Profile: No results for input(s): INR, PROTIME in the last 168 hours. Cardiac Enzymes: No results for input(s): CKTOTAL, CKMB, CKMBINDEX, TROPONINI in the last 168 hours. BNP (last 3 results) Recent Labs    01/09/20 1443  PROBNP 1,018*   HbA1C: No results for input(s): HGBA1C in the last 72 hours. CBG: No results for input(s): GLUCAP in the last 168 hours. Lipid Profile: No results for input(s): CHOL, HDL, LDLCALC, TRIG, CHOLHDL, LDLDIRECT in the last 72 hours. Thyroid Function Tests: No results for input(s): TSH, T4TOTAL, FREET4, T3FREE, THYROIDAB in the last 72 hours. Anemia Panel: No results for input(s): VITAMINB12, FOLATE, FERRITIN, TIBC, IRON,  RETICCTPCT in the last 72 hours. Sepsis Labs: No results for input(s): PROCALCITON, LATICACIDVEN in the last 168 hours.  No results found for this or any previous visit (from the past 240 hour(s)).   Radiology Studies: No results found.  Scheduled Meds: . (feeding supplement) PROSource Plus  30 mL Oral BID BM  . aspirin  81 mg Oral QPC supper  . atorvastatin  20 mg Oral Daily  . Chlorhexidine Gluconate Cloth  6 each Topical Daily  . feeding supplement  1 Container Oral TID BM  . ferrous sulfate  325 mg Oral Q breakfast  . heparin injection (subcutaneous)  5,000 Units Subcutaneous Q8H  . midodrine  10 mg Oral TID WC  . senna-docusate  1 tablet Oral BID   Continuous Infusions:   LOS: 18 days   Marylu Lund, MD [facility] Pager On Amion  If 7PM-7AM, please contact night-coverage 12/28/2020, 3:09 PM

## 2020-12-28 NOTE — Plan of Care (Signed)
  Problem: Education: Goal: Knowledge of General Education information will improve Description: Including pain rating scale, medication(s)/side effects and non-pharmacologic comfort measures Outcome: Progressing   Problem: Health Behavior/Discharge Planning: Goal: Ability to manage health-related needs will improve Outcome: Progressing   Problem: Activity: Goal: Risk for activity intolerance will decrease Outcome: Progressing   Problem: Nutrition: Goal: Adequate nutrition will be maintained Outcome: Progressing   Problem: Coping: Goal: Level of anxiety will decrease Outcome: Progressing   Problem: Pain Managment: Goal: General experience of comfort will improve Outcome: Progressing   Problem: Skin Integrity: Goal: Risk for impaired skin integrity will decrease Outcome: Progressing   

## 2020-12-28 NOTE — Progress Notes (Signed)
Patient ID: Andrew [name], male   DOB: 02/09/1932, 87 y.o.   MRN: [id number] S: Feels "good" this morning.  UOP increased overnight. O:BP (!) 101/48 (BP Location: Right Arm)   Pulse 70   Temp 98.1 F (36.7 C) (Oral)   Resp 14   Ht 5\' 8"  (1.727 m)   Wt 64 kg   SpO2 100%   BMI 21.45 kg/m   Intake/Output Summary (Last 24 hours) at 12/28/2020 1218 Last data filed at 12/27/2020 2038 Gross per 24 hour  Intake --  Output 750 ml  Net -750 ml   Intake/Output: I/O last 3 completed shifts: In: -  Out: 1270 [Urine:1270]  Intake/Output this shift:  No intake/output data recorded. Weight change:  Gen: NAD CVS: RRR Resp: CTA Abd: benign Ext: no edema  Recent Labs  Lab 12/22/20 0412 12/23/20 0712 12/24/20 0202 12/25/20 0229 12/26/20 0227 12/27/20 0439 12/28/20 0403  NA 139 141 140 138 137 136 136  K 3.3* 3.8 3.5 3.7 3.7 3.1* 4.2  CL 101 106 107 100 100 97* 102  CO2 25 24 21* 26 27 26  19*  GLUCOSE 95 91 93 97 92 93 82  BUN 74* 82* 85* 31* 47* 57* 69*  CREATININE 5.39* 5.80* 6.12* 3.23* 4.53* 5.25* 5.85*  ALBUMIN 2.1* 2.2* 2.3* 2.3* 2.2* 2.4* 2.5*  CALCIUM 8.5* 8.4* 8.2* 8.1* 8.1* 7.9* 7.8*  PHOS 4.5 5.1* 5.9* 3.4 5.8* 7.0* 7.0*   Liver Function Tests: Recent Labs  Lab 12/26/20 0227 12/27/20 0439 12/28/20 0403  ALBUMIN 2.2* 2.4* 2.5*   No results for input(s): LIPASE, AMYLASE in the last 168 hours. No results for input(s): AMMONIA in the last 168 hours. CBC: Recent Labs  Lab 12/23/20 0712  WBC 8.5  HGB 8.1*  HCT 26.3*  MCV 97.4  PLT 323   Cardiac Enzymes: No results for input(s): CKTOTAL, CKMB, CKMBINDEX, TROPONINI in the last 168 hours. CBG: No results for input(s): GLUCAP in the last 168 hours.  Iron Studies: No results for input(s): IRON, TIBC, TRANSFERRIN, FERRITIN in the last 72 hours. Studies/Results: No results found. . (feeding supplement) PROSource Plus  30 mL Oral BID BM  . aspirin  81 mg Oral QPC supper  . atorvastatin  20 mg Oral Daily  .  Chlorhexidine Gluconate Cloth  6 each Topical Daily  . feeding supplement  1 Container Oral TID BM  . ferrous sulfate  325 mg Oral Q breakfast  . heparin injection (subcutaneous)  5,000 Units Subcutaneous Q8H  . midodrine  10 mg Oral TID WC  . senna-docusate  1 tablet Oral BID    BMET    Component Value Date/Time   NA 136 12/28/2020 0403   NA 139 03/18/2020 1456   K 4.2 12/28/2020 0403   CL 102 12/28/2020 0403   CO2 19 (L) 12/28/2020 0403   GLUCOSE 82 12/28/2020 0403   BUN 69 (H) 12/28/2020 0403   BUN 44 (H) 03/18/2020 1456   CREATININE 5.85 (H) 12/28/2020 0403   CREATININE 1.32 (H) 08/19/2019 1358   CALCIUM 7.8 (L) 12/28/2020 0403   GFRNONAA 9 (L) 12/28/2020 0403   GFRAA 71 03/18/2020 1456   CBC    Component Value Date/Time   WBC 8.5 12/23/2020 0712   RBC 2.70 (L) 12/23/2020 0712   HGB 8.1 (L) 12/23/2020 0712   HCT 26.3 (L) 12/23/2020 0712   PLT 323 12/23/2020 0712   MCV 97.4 12/23/2020 0712   MCH 30.0 12/23/2020 0712   MCHC 30.8 12/23/2020 6578  RDW 14.4 12/23/2020 0712   LYMPHSABS 0.2 (L) 12/10/2020 1713   MONOABS 0.8 12/10/2020 1713   EOSABS 0.5 12/10/2020 1713   BASOSABS 0.1 12/10/2020 1713    Assessment/Plan:  1. AKI, non-oliguric - Presumably due to ischemic ATN in setting of hypotension and volume depletion.  Started on HD 12/20/20 and last HD on 12/24/20 with increased UOP up to 900 cc's for the last 2 days.  BUN/Cr slowly rising, however UOP has increased to 1.2 liters and he is awake and alert without uremic symptoms.  Continue to hold off on further HD for now and follow.   1. Non-tunneled HD catheter placed by IR on 12/20/20 2. Anemia - s/p ESA, transfuse prn 3. Acute hypoxic respiratory failure - associated with CAP.   4. CAP - resolved with abx  5. Hypotension - currently on midodrine   Donetta Potts, MD [facility] [phone]

## 2020-12-29 DIAGNOSIS — N179 Acute kidney failure, unspecified: Secondary | ICD-10-CM | POA: Diagnosis not present

## 2020-12-29 LAB — RENAL FUNCTION PANEL
Albumin: 2.5 g/dL — ABNORMAL LOW (ref 3.5–5.0)
Anion gap: 15 (ref 5–15)
BUN: 76 mg/dL — ABNORMAL HIGH (ref 8–23)
CO2: 22 mmol/L (ref 22–32)
Calcium: 7.7 mg/dL — ABNORMAL LOW (ref 8.9–10.3)
Chloride: 101 mmol/L (ref 98–111)
Creatinine, Ser: 6.27 mg/dL — ABNORMAL HIGH (ref 0.61–1.24)
GFR, Estimated: 8 mL/min — ABNORMAL LOW (ref >60)
Glucose, Bld: 87 mg/dL (ref 70–99)
Phosphorus: 7.4 mg/dL — ABNORMAL HIGH (ref 2.5–4.6)
Potassium: 3.8 mmol/L (ref 3.5–5.1)
Sodium: 138 mmol/L (ref 135–145)

## 2020-12-29 MED ORDER — SODIUM CHLORIDE 0.9 % IV BOLUS
250.0000 mL | Freq: Once | INTRAVENOUS | Status: AC
  Administered 2020-12-30: 250 mL via INTRAVENOUS

## 2020-12-29 NOTE — Progress Notes (Signed)
Occupational Therapy Treatment Patient Details Name: ESTUARDO FRISBEE MRN: [id number] DOB: 03/24/1932 Today's Date: 12/29/2020    History of present illness 87 y.o. male presented 12/10/20 to the emergency department for evaluation of abnormal blood work. Acute renal failure with uremia;  CT the chest, abdomen, and pelvis is notable for pneumonia. Pt had temporary HD catheter placed 5/30 in preparation for HD. PMH- thoracic aortic aneurysm status post stent graft repair, CABG in 2010, AAA, chronic diastolic CHF, BPH, urinary retention, CKD II, and remote atrial fibrillation not anticoagulated, back surgery   OT comments  Patient making slow progress toward goals. Some goals downgraded to reflect slow progress. Patient continues to require Stedy for functional transfers secondary to fear of falling. Time spent educating patient on CLOF and likely ability to complete sit to stand transfers with 1-2 person assist and use of RW. Patient receptive to education. This date patient completed return to supine with Min guard and supine scoot toward HOB with HOB lowered and Min guard demonstrating increased independence with bed mobility. Continues to be limited by decreased activity tolerance and generalized weakness. OT will continue to follow acutely.     Follow Up Recommendations  SNF;Supervision/Assistance - 24 hour    Equipment Recommendations  Other (comment) (Defer to next level of care)    Recommendations for Other Services      Precautions / Restrictions Precautions Precautions: Fall Precaution Comments: HD port Restrictions Weight Bearing Restrictions: No       Mobility Bed Mobility Overal bed mobility: Needs Assistance Bed Mobility: Sit to Supine Rolling: Max assist Sidelying to sit: Max assist;HOB elevated Supine to sit: Max assist Sit to supine: Min guard Sit to sidelying: +2 for physical assistance;Mod assist General bed mobility comments: Patient able to control descent of  trunk to bed surface and advance BLE from EOB to bed surface with Min guard for safety. Able to complete supine scoot toward HOB with BUE on headboard and BLE with knees flexed on bed surface. No external assist required.    Transfers Overall transfer level: Needs assistance Equipment used: Rolling walker (2 wheeled) Transfers: Sit to/from Stand Sit to Stand: Min guard Stand pivot transfers: Mod assist;Max assist;+2 physical assistance;From elevated surface       General transfer comment: Min guard for sit to stand in stedy from low recliner. Patient quick to fatigue declining further standing trials in stedy or with RW.    Balance Overall balance assessment: Needs assistance Sitting-balance support: Bilateral upper extremity supported;Feet supported Sitting balance-Leahy Scale: Fair Sitting balance - Comments: Maintains static sitting balance at EOB with supervision A for safety. Postural control: Posterior lean Standing balance support: Bilateral upper extremity supported Standing balance-Leahy Scale: Poor Standing balance comment: Able to maintain static standing in [place] with Min guard at best. Needs mod to max for static stand with RW secondary to fear.                           ADL either performed or assessed with clinical judgement   ADL Overall ADL's : Needs assistance/impaired                                       General ADL Comments: Slowly progressing toward goals.     Vision       Quarry manager  Arousal/Alertness: Awake/alert Behavior During Therapy: Flat affect Overall Cognitive Status: Impaired/Different from baseline Area of Impairment: Attention;Following commands;Problem solving;Safety/judgement                     Memory: Decreased short-term memory Following Commands: Follows one step commands with increased time;Follows one step commands inconsistently Safety/Judgement: Decreased  awareness of deficits   Problem Solving: Slow processing;Decreased initiation;Requires verbal cues;Requires tactile cues;Difficulty sequencing General Comments: Patient able to make needs known.        Exercises Exercises: General Lower Extremity General Exercises - Lower Extremity Long Arc Quad: AROM;Both;Seated;10 reps Hip Flexion/Marching: AROM;Both;Seated;10 reps   Shoulder Instructions       General Comments VSS on 3L O2 via [place].    Pertinent Vitals/ Pain       Pain Assessment: Faces Faces Pain Scale: Hurts a little bit Pain Location: Abdomen Pain Descriptors / Indicators: Cramping;Sore Pain Intervention(s): Other (comment);Repositioned (Offered toileting, patient declined.)  Home Living                                          Prior Functioning/Environment              Frequency  Min 2X/week        Progress Toward Goals  OT Goals(current goals can now be found in the care plan section)  Progress towards OT goals: Progressing toward goals  Acute Rehab OT Goals Patient Stated Goal: To be finished with dialysis OT Goal Formulation: With patient Time For Goal Achievement: 12/28/20 ADL Goals Pt Will Perform Grooming: with set-up;sitting Pt Will Perform Upper Body Dressing: with set-up;sitting Pt Will Perform Lower Body Dressing: with min assist;sit to/from stand;sitting/lateral leans Pt Will Transfer to Toilet: with min assist;bedside commode;stand pivot transfer Pt Will Perform Toileting - Clothing Manipulation and hygiene: with min assist;sitting/lateral leans Pt/caregiver will Perform Home Exercise Program: Both right and left upper extremity;Increased strength;With written HEP provided Additional ADL Goal #1: Patient will complete sit to stand transfer from EOB to RW with Mod A in prep for ADL transfers.  Plan Discharge plan remains appropriate;Frequency remains appropriate    Co-evaluation                 AM-PAC OT "6  Clicks" Daily Activity     Outcome Measure   Help from another person eating meals?: A Little Help from another person taking care of personal grooming?: A Little Help from another person toileting, which includes using toliet, bedpan, or urinal?: Total Help from another person bathing (including washing, rinsing, drying)?: A Lot Help from another person to put on and taking off regular upper body clothing?: A Little Help from another person to put on and taking off regular lower body clothing?: A Lot 6 Click Score: 14    End of Session Equipment Utilized During Treatment: Oxygen;Gait belt  OT Visit Diagnosis: Unsteadiness on feet (R26.81);Other abnormalities of gait and mobility (R26.89);Muscle weakness (generalized) (M62.81);History of falling (Z91.81)   Activity Tolerance Patient tolerated treatment well;Patient limited by fatigue   Patient Left in bed;with call bell/phone within reach;with bed alarm set;with family/visitor present   Nurse Communication Mobility status        Time: 4081-4481 OT Time Calculation (min): 21 min  Charges: OT General Charges $OT Visit: 1 Visit OT Treatments $Therapeutic Activity: 8-22 mins  Rockford Leinen H. OTR/L Supplemental OT, Department of rehab services [phone]  [place] Zarazua R H. 12/29/2020, 1:46 PM

## 2020-12-29 NOTE — Progress Notes (Signed)
Patient ID: Andrew [name], male   DOB: 07/26/1931, 87 y.o.   MRN: [id number] S: No events overnight. O:BP (!) 114/56 (BP Location: Right Arm)   Pulse 69   Temp 98.3 F (36.8 C) (Oral)   Resp 16   Ht 5\' 8"  (1.727 m)   Wt 64 kg   SpO2 100%   BMI 21.45 kg/m  No intake or output data in the 24 hours ending 12/29/20 1057 Intake/Output: I/O last 3 completed shifts: In: -  Out: 750 [Urine:750]  Intake/Output this shift:  No intake/output data recorded. Weight change:  Gen: NAD CVS: RRR Resp: cta Abd: +BS, soft, NT/ND Ext: no edema  Recent Labs  Lab 12/23/20 0712 12/24/20 0202 12/25/20 0229 12/26/20 0227 12/27/20 0439 12/28/20 0403 12/29/20 0414  NA 141 140 138 137 136 136 138  K 3.8 3.5 3.7 3.7 3.1* 4.2 3.8  CL 106 107 100 100 97* 102 101  CO2 24 21* 26 27 26  19* 22  GLUCOSE 91 93 97 92 93 82 87  BUN 82* 85* 31* 47* 57* 69* 76*  CREATININE 5.80* 6.12* 3.23* 4.53* 5.25* 5.85* 6.27*  ALBUMIN 2.2* 2.3* 2.3* 2.2* 2.4* 2.5* 2.5*  CALCIUM 8.4* 8.2* 8.1* 8.1* 7.9* 7.8* 7.7*  PHOS 5.1* 5.9* 3.4 5.8* 7.0* 7.0* 7.4*   Liver Function Tests: Recent Labs  Lab 12/27/20 0439 12/28/20 0403 12/29/20 0414  ALBUMIN 2.4* 2.5* 2.5*   No results for input(s): LIPASE, AMYLASE in the last 168 hours. No results for input(s): AMMONIA in the last 168 hours. CBC: Recent Labs  Lab 12/23/20 0712  WBC 8.5  HGB 8.1*  HCT 26.3*  MCV 97.4  PLT 323   Cardiac Enzymes: No results for input(s): CKTOTAL, CKMB, CKMBINDEX, TROPONINI in the last 168 hours. CBG: No results for input(s): GLUCAP in the last 168 hours.  Iron Studies: No results for input(s): IRON, TIBC, TRANSFERRIN, FERRITIN in the last 72 hours. Studies/Results: No results found. . (feeding supplement) PROSource Plus  30 mL Oral BID BM  . aspirin  81 mg Oral QPC supper  . atorvastatin  20 mg Oral Daily  . Chlorhexidine Gluconate Cloth  6 each Topical Daily  . feeding supplement  1 Container Oral TID BM  . ferrous sulfate   325 mg Oral Q breakfast  . heparin injection (subcutaneous)  5,000 Units Subcutaneous Q8H  . midodrine  10 mg Oral TID WC  . senna-docusate  1 tablet Oral BID    BMET    Component Value Date/Time   NA 138 12/29/2020 0414   NA 139 03/18/2020 1456   K 3.8 12/29/2020 0414   CL 101 12/29/2020 0414   CO2 22 12/29/2020 0414   GLUCOSE 87 12/29/2020 0414   BUN 76 (H) 12/29/2020 0414   BUN 44 (H) 03/18/2020 1456   CREATININE 6.27 (H) 12/29/2020 0414   CREATININE 1.32 (H) 08/19/2019 1358   CALCIUM 7.7 (L) 12/29/2020 0414   GFRNONAA 8 (L) 12/29/2020 0414   GFRAA 71 03/18/2020 1456   CBC    Component Value Date/Time   WBC 8.5 12/23/2020 0712   RBC 2.70 (L) 12/23/2020 0712   HGB 8.1 (L) 12/23/2020 0712   HCT 26.3 (L) 12/23/2020 0712   PLT 323 12/23/2020 0712   MCV 97.4 12/23/2020 0712   MCH 30.0 12/23/2020 0712   MCHC 30.8 12/23/2020 0712   RDW 14.4 12/23/2020 0712   LYMPHSABS 0.2 (L) 12/10/2020 1713   MONOABS 0.8 12/10/2020 1713   EOSABS 0.5 12/10/2020  1713   BASOSABS 0.1 12/10/2020 1713    Assessment/Plan:  1. AKI, non-oliguric - Presumably due to ischemic ATN in setting of hypotension and volume depletion. Started on HD 12/20/20 and last HD on 12/24/20 with increased UOP up to 900 cc's for the last 2 days. BUN/Cr slowly rising, however UOP has increased but nothing documented for the past 24 hours.  He is awake and alert without uremic symptoms, however his BUN/Cr continue to rise.  Plan for another session of HD today without fluid removal just for clearance.  1. Non-tunneled HD catheter placed by IR on 12/20/20 2. Anemia - s/p ESA, transfuse prn 3. Acute hypoxic respiratory failure - associated with CAP.  4. CAP - resolved with abx  5. Hypotension - currently on midodrine   Donetta Potts, MD [facility] [phone]

## 2020-12-29 NOTE — Progress Notes (Signed)
Physical Therapy Treatment Patient Details Name: Andrew [name] MRN: [id number] DOB: 1932-02-22 Today's Date: 12/29/2020    History of Present Illness 87 y.o. male presented 12/10/20 to the emergency department for evaluation of abnormal blood work. Acute renal failure with uremia;  CT the chest, abdomen, and pelvis is notable for pneumonia. Pt had temporary HD catheter placed 5/30 in preparation for HD. PMH- thoracic aortic aneurysm status post stent graft repair, CABG in 2010, AAA, chronic diastolic CHF, BPH, urinary retention, CKD II, and remote atrial fibrillation not anticoagulated, back surgery    PT Comments    Pt admitted with above diagnosis. Pt was able to stand and pivot with RW but needed mod to max assist of 2 for safety with pt with poor postural stability and poor safety awareness.  Wanted to try the RW instead of Stedy for progression however pt does much better with STedy due to his fear of falling as pt does not feel as supported with RW. Will continue to progress pt as able.    Pt currently with functional limitations due to the deficits listed below (see PT Problem List). Pt will benefit from skilled PT to increase their independence and safety with mobility to allow discharge to the venue listed below.     Follow Up Recommendations  SNF     Equipment Recommendations  Wheelchair (measurements PT);Wheelchair cushion (measurements PT);Hospital bed    Recommendations for Other Services OT consult     Precautions / Restrictions Precautions Precautions: Fall Precaution Comments: HD port Restrictions Weight Bearing Restrictions: No    Mobility  Bed Mobility Overal bed mobility: Needs Assistance Bed Mobility: Sidelying to Sit;Rolling;Sit to Supine Rolling: Max assist Sidelying to sit: Max assist;HOB elevated Supine to sit: Max assist Sit to supine: Max assist Sit to sidelying: +2 for physical assistance;Mod assist General bed mobility comments: Assist to advance BLE  toward EOB and elevate trunk. Patient completes ~25% of the task. Pt has fear of falling and works against therapy with coming to EOB each time.  Pt pushing posterior greatly at times coming to EOB.    Transfers Overall transfer level: Needs assistance Equipment used: Rolling walker (2 wheeled) Transfers: Sit to/from Stand Sit to Stand: +2 physical assistance;Min assist;From elevated surface;Mod assist Stand pivot transfers: Mod assist;Max assist;+2 physical assistance;From elevated surface       General transfer comment: mod assist with use of bed pad to bring hips up. Pt not able to fully stand when using RW as he panics and pushes RW out and needs asssist and cues for postural stability.  Pt flexes trunk, incr BOS and does not place feet to where he can stay balanced and needs incr  tactile cues and assist for full upright standing and cannot stand for long periods of time.  Was able to pivot to the 3N1 with the RW and then to the recliner with the RW with mod to max assist of 2 with pt able to step around but had a lot of instability and incr support.  Ambulation/Gait                 Stairs             Wheelchair Mobility    Modified Rankin (Stroke Patients Only)       Balance Overall balance assessment: Needs assistance Sitting-balance support: Bilateral upper extremity supported;Feet supported Sitting balance-Leahy Scale: Poor Sitting balance - Comments: fluctuating from requiring min-mod A for countering posterior lean with onset of fatigue  Postural control: Posterior lean Standing balance support: Bilateral upper extremity supported Standing balance-Leahy Scale: Poor Standing balance comment: Able to maintain static standing in [place] with Min A. Needs mod to max for static stand with RW due to ?fear                            Cognition Arousal/Alertness: Awake/alert Behavior During Therapy: Flat affect Overall Cognitive Status:  Impaired/Different from baseline Area of Impairment: Attention;Following commands;Problem solving;Safety/judgement                     Memory: Decreased short-term memory Following Commands: Follows one step commands with increased time;Follows one step commands inconsistently Safety/Judgement: Decreased awareness of deficits   Problem Solving: Slow processing;Decreased initiation;Requires verbal cues;Requires tactile cues;Difficulty sequencing General Comments: Patient able to make needs known. Often reports need for BM with movement.      Exercises General Exercises - Lower Extremity Long Arc Quad: AROM;Both;Seated;10 reps Hip Flexion/Marching: AROM;Both;Seated;10 reps    General Comments General comments (skin integrity, edema, etc.): 102/55, VSS with pt on 3LO2.      Pertinent Vitals/Pain Pain Assessment: No/denies pain    Home Living                      Prior Function            PT Goals (current goals can now be found in the care plan section) Progress towards PT goals: Progressing toward goals    Frequency    Min 2X/week      PT Plan Current plan remains appropriate    Co-evaluation              AM-PAC PT "6 Clicks" Mobility   Outcome Measure  Help needed turning from your back to your side while in a flat bed without using bedrails?: A Lot Help needed moving from lying on your back to sitting on the side of a flat bed without using bedrails?: A Lot Help needed moving to and from a bed to a chair (including a wheelchair)?: A Lot Help needed standing up from a chair using your arms (e.g., wheelchair or bedside chair)?: A Lot Help needed to walk in hospital room?: Total Help needed climbing 3-5 steps with a railing? : Total 6 Click Score: 10    End of Session Equipment Utilized During Treatment: Gait belt;Oxygen Activity Tolerance: Patient limited by fatigue Patient left: with call bell/phone within reach;with family/visitor  present;in chair;with chair alarm set Nurse Communication: Mobility status;Need for lift equipment Charlaine Dalton) PT Visit Diagnosis: Muscle weakness (generalized) (M62.81);Difficulty in walking, not elsewhere classified (R26.2)     Time: 7867-5449 PT Time Calculation (min) (ACUTE ONLY): 25 min  Charges:  $Therapeutic Exercise: 8-22 mins $Therapeutic Activity: 8-22 mins                     Brandi Armato M,PT Acute Rehab Services [phone] (pager)   Alvira Philips 12/29/2020, 11:53 AM

## 2020-12-29 NOTE — TOC Progression Note (Addendum)
Transition of Care [facility]) - Progression Note    Patient Details  Name: Andrew [name] MRN: [id number] Date of Birth: 09/19/1931  Transition of Care [facility]) CM/SW Contact  Joanne Chars, LCSW Phone Number: 12/29/2020, 11:05 AM  Clinical Narrative:  CSW spoke with wife, she has completed the process with [facility] and they will be moving there as residents, which will allow pt to go to SNF.    CSW LM with Bobetta at [facility] to confirm SNF bed later this week.    1500:  CSW spoke with Jordan, she is aware of and following pt, planning on SNF referral pending review of current situation.  HD is not a problem if that is required.  Otilio Carpen is off next week, Amber from the [facility] will be covering SNF admissions.      Expected Discharge Plan: [place] Barriers to Discharge: Insurance Authorization,Continued Medical Work up  Expected Discharge Plan and Services Expected Discharge Plan: [place] Choice: [place] arrangements for the past 2 months: Single Family Home                                       Social Determinants of Health (SDOH) Interventions    Readmission Risk Interventions No flowsheet data found.

## 2020-12-29 NOTE — Progress Notes (Signed)
PROGRESS NOTE    Andrew [name]  GGY:[id number] DOB: 04/03/1932 DOA: 12/10/2020 PCP: [name], MD   Brief Narrative:  87 y.o.malewith a history of thoracic aortic aneurysm status post stent graft repair, AAA, CABG in 2010, chronic HFpEF, BPH with urinary retention, CKDII,and remote atrial fibrillation not anticoagulated who presented to the ED 5/20 prompted by PCP for abnormal blood work which was drawn in the setting of cough that hadn't improved with augmentin. He also had been losing appetite and feeling unwell.  Labs were notable for mild leukocytosis, worsening anemia, renal failure. CT the chest, abdomen, and pelvis is notable for new left upper lobe infiltrate, unchanged stent graft repair of thoracic aortic aneurysm, and grossly stable infrarenal AAA. Patient was treated with rocephin, azithromycin, albuterol, and IV fluids in the ED. SBP nadir was in 70's shortly after admission. Creatinine was 8.71 worsening to 9.32 from previous of 1.09 in Aug 2021. Nephrology was consulted.Further goals of care discussions have continued. The patient and family now confirm their desire for trial of hemodialysis if there continues to be no renal recovery. Trialysis catheter was placed 5/30with first dialysis session alter that day.    Assessment & Plan:   Principal Problem:   AKI (acute kidney injury) ([place]) Active Problems:   Thoracic aortic aneurysm without rupture ([place])   Coronary artery disease   Urinary retention   AAA (abdominal aortic aneurysm) without rupture (H[facility])   Essential hypertension   Normocytic anemia   Chronic diastolic CHF (congestive heart failure) ([place])   Community acquired pneumonia   Acute kidney injury, nonoliguric.  Suspect ATN from hypotension - Renal function continues to worsen.  Adequate urine output. Cr 6.27 - Nontunneled HD placed 5/13. - Nephro following.  Dialysis per nephro discretion. -Monitor electrolytes  Acute hypoxic respiratory  failure: Due to CAP, possible aspiration, possible renal failure-related volume overload.  -Recently completed antibiotics.  For now continue as needed bronchodilators.  I-S/flutter.  Out of bed to chair.  Supplemental O2.  Aspiration precautions.  Left upper lobe community-acquired pneumonia - Completed 5 days of Rocephin and azithromycin  Hypotension - Improved on midodrine 10 mg 3 times daily  Urinary retention: Chronic, BPH.  - Foley in place  CAD: History of PCI and CABG 2010. No anginal complaints currently.  --Continue aspirin and statin  Chronic HFpEF: Echo 5/26 unchanged from prior with LVEF 60-65%, unevaluated diastolic function, no RWMAs. Normal RV, no severe valvular disease.  -HD per nephrology  Thoracic aortic aneurysm s/p repair:  -Stable  AAA: Appears stable on CT in ED - VVS appointment with repeat U/S on 6/21 with Dr. Carlis Abbott.   Iron deficiency anemia: No acute bleeding noted. Hgb rebounding s/p aranesppernephrology. -Iron supplements with bowel regimen   DVT prophylaxis: Subcu heparin Code Status:  Family Communication:  Spouse at Bedside  Status is: Inpatient  Remains inpatient appropriate because:Inpatient level of care appropriate due to severity of illness   Dispo: The patient is from: Home              Anticipated d/c is to: SNF              Patient currently is not medically stable to d/c.  Plans for dialysis today.  Maintain hospital stay until cleared by nephrology team.   Difficult to place patient No       Nutritional status  Nutrition Problem: Inadequate oral intake Etiology: poor appetite  Signs/Symptoms: per patient/family report  Interventions: Boost Breeze,Prostat,Refer to RD note for  recommendations  Body mass index is 21.45 kg/m.           Subjective: Patient seen and examined at bedside, does not have any complaints at this time.  About 1 L urine output in last 24 hours.  Review of Systems Otherwise  negative except as per HPI, including: General: Denies fever, chills, night sweats or unintended weight loss. Resp: Denies cough, wheezing, shortness of breath. Cardiac: Denies chest pain, palpitations, orthopnea, paroxysmal nocturnal dyspnea. GI: Denies abdominal pain, nausea, vomiting, diarrhea or constipation GU: Denies dysuria, frequency, hesitancy or incontinence MS: Denies muscle aches, joint pain or swelling Neuro: Denies headache, neurologic deficits (focal weakness, numbness, tingling), abnormal gait Psych: Denies anxiety, depression, SI/HI/AVH Skin: Denies new rashes or lesions ID: Denies sick contacts, exotic exposures, travel  Examination:  General exam: Appears calm and comfortable  Respiratory system: Clear to auscultation. Respiratory effort normal. Cardiovascular system: S1 & S2 heard, RRR. No JVD, murmurs, rubs, gallops or clicks. No pedal edema. Gastrointestinal system: Abdomen is nondistended, soft and nontender. No organomegaly or masses felt. Normal bowel sounds heard. Central nervous system: Alert and oriented. No focal neurological deficits. Extremities: Symmetric 5 x 5 power. Skin: No rashes, lesions or ulcers Psychiatry: Judgement and insight appear normal. Mood & affect appropriate.   Left neck nontunneled HD catheter noted without any sign of infection  Objective: Vitals:   12/28/20 1300 12/28/20 2000 12/29/20 0000 12/29/20 0400  BP: 108/66 106/73 108/81 (!) 114/56  Pulse: 67 (!) 56 62 69  Resp: 15 15 19 16   Temp: 97.8 F (36.6 C) 98.7 F (37.1 C) 98.5 F (36.9 C) 98.3 F (36.8 C)  TempSrc: Oral Oral Oral Oral  SpO2: 100% 96% 100% 100%  Weight:      Height:       No intake or output data in the 24 hours ending 12/29/20 0809 Filed Weights   12/24/20 1923 12/26/20 0441 12/28/20 0500  Weight: 60.5 kg 65 kg 64 kg     Data Reviewed:   CBC: Recent Labs  Lab 12/23/20 0712  WBC 8.5  HGB 8.1*  HCT 26.3*  MCV 97.4  PLT 585   Basic Metabolic  Panel: Recent Labs  Lab 12/25/20 0229 12/26/20 0227 12/27/20 0439 12/28/20 0403 12/29/20 0414  NA 138 137 136 136 138  K 3.7 3.7 3.1* 4.2 3.8  CL 100 100 97* 102 101  CO2 26 27 26  19* 22  GLUCOSE 97 92 93 82 87  BUN 31* 47* 57* 69* 76*  CREATININE 3.23* 4.53* 5.25* 5.85* 6.27*  CALCIUM 8.1* 8.1* 7.9* 7.8* 7.7*  PHOS 3.4 5.8* 7.0* 7.0* 7.4*   GFR: Estimated Creatinine Clearance: 7.7 mL/min (A) (by C-G formula based on SCr of 6.27 mg/dL (H)). Liver Function Tests: Recent Labs  Lab 12/25/20 0229 12/26/20 0227 12/27/20 0439 12/28/20 0403 12/29/20 0414  ALBUMIN 2.3* 2.2* 2.4* 2.5* 2.5*   No results for input(s): LIPASE, AMYLASE in the last 168 hours. No results for input(s): AMMONIA in the last 168 hours. Coagulation Profile: No results for input(s): INR, PROTIME in the last 168 hours. Cardiac Enzymes: No results for input(s): CKTOTAL, CKMB, CKMBINDEX, TROPONINI in the last 168 hours. BNP (last 3 results) Recent Labs    01/09/20 1443  PROBNP 1,018*   HbA1C: No results for input(s): HGBA1C in the last 72 hours. CBG: No results for input(s): GLUCAP in the last 168 hours. Lipid Profile: No results for input(s): CHOL, HDL, LDLCALC, TRIG, CHOLHDL, LDLDIRECT in the last 72 hours.  Thyroid Function Tests: No results for input(s): TSH, T4TOTAL, FREET4, T3FREE, THYROIDAB in the last 72 hours. Anemia Panel: No results for input(s): VITAMINB12, FOLATE, FERRITIN, TIBC, IRON, RETICCTPCT in the last 72 hours. Sepsis Labs: No results for input(s): PROCALCITON, LATICACIDVEN in the last 168 hours.  No results found for this or any previous visit (from the past 240 hour(s)).       Radiology Studies: No results found.      Scheduled Meds: . (feeding supplement) PROSource Plus  30 mL Oral BID BM  . aspirin  81 mg Oral QPC supper  . atorvastatin  20 mg Oral Daily  . Chlorhexidine Gluconate Cloth  6 each Topical Daily  . feeding supplement  1 Container Oral TID BM  .  ferrous sulfate  325 mg Oral Q breakfast  . heparin injection (subcutaneous)  5,000 Units Subcutaneous Q8H  . midodrine  10 mg Oral TID WC  . senna-docusate  1 tablet Oral BID   Continuous Infusions:   LOS: 19 days   Time spent= 35 mins    Adley Castello Arsenio Loader, MD [facility]  If 7PM-7AM, please contact night-coverage  12/29/2020, 8:09 AM

## 2020-12-30 DIAGNOSIS — N179 Acute kidney failure, unspecified: Secondary | ICD-10-CM | POA: Diagnosis not present

## 2020-12-30 LAB — CBC
HCT: 24.6 % — ABNORMAL LOW (ref 39.0–52.0)
Hemoglobin: 7.6 g/dL — ABNORMAL LOW (ref 13.0–17.0)
MCH: 30 pg (ref 26.0–34.0)
MCHC: 30.9 g/dL (ref 30.0–36.0)
MCV: 97.2 fL (ref 80.0–100.0)
Platelets: 230 10*3/uL (ref 150–400)
RBC: 2.53 MIL/uL — ABNORMAL LOW (ref 4.22–5.81)
RDW: 14.6 % (ref 11.5–15.5)
WBC: 10.8 10*3/uL — ABNORMAL HIGH (ref 4.0–10.5)
nRBC: 0 % (ref 0.0–0.2)

## 2020-12-30 LAB — RENAL FUNCTION PANEL
Albumin: 2.4 g/dL — ABNORMAL LOW (ref 3.5–5.0)
Anion gap: 11 (ref 5–15)
BUN: 33 mg/dL — ABNORMAL HIGH (ref 8–23)
CO2: 27 mmol/L (ref 22–32)
Calcium: 7.7 mg/dL — ABNORMAL LOW (ref 8.9–10.3)
Chloride: 100 mmol/L (ref 98–111)
Creatinine, Ser: 3.52 mg/dL — ABNORMAL HIGH (ref 0.61–1.24)
GFR, Estimated: 16 mL/min — ABNORMAL LOW (ref >60)
Glucose, Bld: 89 mg/dL (ref 70–99)
Phosphorus: 4.2 mg/dL (ref 2.5–4.6)
Potassium: 3.7 mmol/L (ref 3.5–5.1)
Sodium: 138 mmol/L (ref 135–145)

## 2020-12-30 LAB — CORTISOL: Cortisol, Plasma: 18.2 ug/dL

## 2020-12-30 LAB — MAGNESIUM: Magnesium: 1.6 mg/dL — ABNORMAL LOW (ref 1.7–2.4)

## 2020-12-30 LAB — TSH: TSH: 4.178 u[IU]/mL (ref 0.350–4.500)

## 2020-12-30 MED ORDER — PROSOURCE PLUS PO LIQD
30.0000 mL | Freq: Three times a day (TID) | ORAL | Status: DC
  Administered 2020-12-30 – 2021-01-10 (×31): 30 mL via ORAL
  Filled 2020-12-30 (×28): qty 30

## 2020-12-30 NOTE — Progress Notes (Signed)
Patient ID: Andrew [name], male   DOB: 03/19/1932, 87 y.o.   MRN: [id number] S: Feels better today and tolerated HD well. O:BP (!) 102/43 (BP Location: Right Arm)   Pulse 71   Temp 98.4 F (36.9 C) (Oral)   Resp 17   Ht 5\' 8"  (1.727 m)   Wt 63.5 kg   SpO2 98%   BMI 21.29 kg/m   Intake/Output Summary (Last 24 hours) at 12/30/2020 1146 Last data filed at 12/30/2020 0615 Gross per 24 hour  Intake 120 ml  Output 1380 ml  Net -1260 ml   Intake/Output: I/O last 3 completed shifts: In: 240 [P.O.:240] Out: 2536 [Urine:1730]  Intake/Output this shift:  No intake/output data recorded. Weight change:  Gen: NAD CVS: RRR Resp: cta [id number] Ext:no edema  Recent Labs  Lab 12/24/20 0202 12/25/20 0229 12/26/20 0227 12/27/20 0439 12/28/20 0403 12/29/20 0414 12/30/20 0106  NA 140 138 137 136 136 138 138  K 3.5 3.7 3.7 3.1* 4.2 3.8 3.7  CL 107 100 100 97* 102 101 100  CO2 21* 26 27 26  19* 22 27  GLUCOSE 93 97 92 93 82 87 89  BUN 85* 31* 47* 57* 69* 76* 33*  CREATININE 6.12* 3.23* 4.53* 5.25* 5.85* 6.27* 3.52*  ALBUMIN 2.3* 2.3* 2.2* 2.4* 2.5* 2.5* 2.4*  CALCIUM 8.2* 8.1* 8.1* 7.9* 7.8* 7.7* 7.7*  PHOS 5.9* 3.4 5.8* 7.0* 7.0* 7.4* 4.2   Liver Function Tests: Recent Labs  Lab 12/28/20 0403 12/29/20 0414 12/30/20 0106  ALBUMIN 2.5* 2.5* 2.4*   No results for input(s): LIPASE, AMYLASE in the last 168 hours. No results for input(s): AMMONIA in the last 168 hours. CBC: Recent Labs  Lab 12/30/20 0106  WBC 10.8*  HGB 7.6*  HCT 24.6*  MCV 97.2  PLT 230   Cardiac Enzymes: No results for input(s): CKTOTAL, CKMB, CKMBINDEX, TROPONINI in the last 168 hours. CBG: No results for input(s): GLUCAP in the last 168 hours.  Iron Studies: No results for input(s): IRON, TIBC, TRANSFERRIN, FERRITIN in the last 72 hours. Studies/Results: No results found.  (feeding supplement) PROSource Plus  30 mL Oral TID BM   aspirin  81 mg Oral QPC supper   atorvastatin  20 mg Oral Daily    Chlorhexidine Gluconate Cloth  6 each Topical Daily   feeding supplement  1 Container Oral TID BM   ferrous sulfate  325 mg Oral Q breakfast   heparin injection (subcutaneous)  5,000 Units Subcutaneous Q8H   midodrine  10 mg Oral TID WC   senna-docusate  1 tablet Oral BID    BMET    Component Value Date/Time   NA 138 12/30/2020 0106   NA 139 03/18/2020 1456   K 3.7 12/30/2020 0106   CL 100 12/30/2020 0106   CO2 27 12/30/2020 0106   GLUCOSE 89 12/30/2020 0106   BUN 33 (H) 12/30/2020 0106   BUN 44 (H) 03/18/2020 1456   CREATININE 3.52 (H) 12/30/2020 0106   CREATININE 1.32 (H) 08/19/2019 1358   CALCIUM 7.7 (L) 12/30/2020 0106   GFRNONAA 16 (L) 12/30/2020 0106   GFRAA 71 03/18/2020 1456   CBC    Component Value Date/Time   WBC 10.8 (H) 12/30/2020 0106   RBC 2.53 (L) 12/30/2020 0106   HGB 7.6 (L) 12/30/2020 0106   HCT 24.6 (L) 12/30/2020 0106   PLT 230 12/30/2020 0106   MCV 97.2 12/30/2020 0106   MCH 30.0 12/30/2020 0106   MCHC 30.9 12/30/2020 0106  RDW 14.6 12/30/2020 0106   LYMPHSABS 0.2 (L) 12/10/2020 1713   MONOABS 0.8 12/10/2020 1713   EOSABS 0.5 12/10/2020 1713   BASOSABS 0.1 12/10/2020 1713     Assessment/Plan:   AKI, non-oliguric - Presumably due to ischemic ATN in setting of hypotension and volume depletion.  Started on HD 12/20/20 and last HD on 12/24/20 with increased UOP up to 900 cc's for the last 2 days.  BUN/Cr slowly rising, however UOP has increased but nothing documented for the past 24 hours.  He is awake and alert without uremic symptoms, however his BUN/Cr continue to rise.     Non-tunneled HD catheter placed by IR on 12/20/20 Had HD 12/29/20 for clearance only (rising BUN). Remains non-oliguric and will follow BUN/Cr and decide on more HD over the next 48 hours. Anemia - s/p ESA, transfuse prn Urinary retention - acute on chronic s/p foley cath placed 12/29/20 Acute hypoxic respiratory failure - associated with CAP.   CAP - resolved with abx Hypotension  - currently on midodrine  Donetta Potts, MD [facility] [phone]

## 2020-12-30 NOTE — Progress Notes (Signed)
PROGRESS NOTE    Andrew [name]  GQQ:[id number] DOB: 08/23/1931 DOA: 12/10/2020 PCP: [name], MD   Brief Narrative:  87 y.o. male with a history of thoracic aortic aneurysm status post stent graft repair, AAA, CABG in 2010, chronic HFpEF, BPH with urinary retention, CKD II, and remote atrial fibrillation not anticoagulated who presented to the ED 5/20 prompted by PCP for abnormal blood work which was drawn in the setting of cough that hadn't improved with augmentin. He also had been losing appetite and feeling unwell.   Labs were notable for mild leukocytosis, worsening anemia, renal failure. CT the chest, abdomen, and pelvis is notable for new left upper lobe infiltrate, unchanged stent graft repair of thoracic aortic aneurysm, and grossly stable infrarenal AAA.  Patient was treated with rocephin, azithromycin, albuterol, and IV fluids in the ED. SBP nadir was in 70's shortly after admission. Creatinine was 8.71 worsening to 9.32 from previous of 1.09 in Aug 2021. Nephrology was consulted. Further goals of care discussions have continued. The patient and family now confirm their desire for trial of hemodialysis if there continues to be no renal recovery.  Nontunneled HD catheter was placed 5/30.     Assessment & Plan:   Principal Problem:   AKI (acute kidney injury) ([place]) Active Problems:   Thoracic aortic aneurysm without rupture ([place])   Coronary artery disease   Urinary retention   AAA (abdominal aortic aneurysm) without rupture ([facility])   Essential hypertension   Normocytic anemia   Chronic diastolic CHF (congestive heart failure) ([place])   Community acquired pneumonia   Acute kidney injury, nonoliguric.  Suspect ATN from hypotension - Renal function continues to worsen.  Adequate urine output.  Creatinine today 3.52 - Nontunneled HD placed 5/30 - Nephro following.  Dialysis per nephro discretion.  Status post HD 6/8 -Monitor electrolytes  Acute on chronic urinary  retention - Greater than 1 L urine retention.  Foley catheter placed 6/8.  Will slowly transition to straight cath 3 times a day which he does at home  Acute hypoxic respiratory failure: Due to CAP, possible aspiration, possible renal failure-related volume overload.  -Recently completed antibiotics.  For now continue as needed bronchodilators.  I-S/flutter.  Out of bed to chair.  Supplemental O2.  Aspiration precautions.  Left upper lobe community-acquired pneumonia - Completed 5 days of Rocephin and azithromycin   Hypotension, intermittent - Continue midodrine 10 mg 3 times daily. -Check random cortisol and TSH   CAD: History of PCI and CABG 2010. No anginal complaints currently.  --Continue aspirin and statin   Chronic HFpEF: Echo 5/26 unchanged from prior with LVEF 60-65%, unevaluated diastolic function, no RWMAs. Normal RV, no severe valvular disease.  -HD per nephrology   Thoracic aortic aneurysm s/p repair:  -Stable   AAA: Appears stable on CT in ED  - VVS appointment with repeat U/S on 6/21 with Dr. Carlis Abbott.    Iron deficiency anemia: No acute bleeding noted. Hgb rebounding s/p aranesp per nephrology. -Iron supplements with bowel regimen    DVT prophylaxis: Subcu heparin Code Status: Full Family Communication:  Wife and bedside and also spoke with Blanch Media over the phone.   Status is: Inpatient  Remains inpatient appropriate because:Inpatient level of care appropriate due to severity of illness   Dispo: The patient is from: Home              Anticipated d/c is to: SNF  Patient currently is not medically stable to d/c.  Plans for dialysis today.  Maintain hospital stay until cleared by nephrology team.   Difficult to place patient No       Nutritional status  Nutrition Problem: Inadequate oral intake Etiology: poor appetite  Signs/Symptoms: per patient/family report  Interventions: Boost Breeze, Prostat, Refer to RD note for recommendations  Body  mass index is 21.29 kg/m.           Subjective: Patient had hemodialysis yesterday.  Overnight had episode of hypotension requiring 250 cc of normal saline bolus.  Is also having urinary retention, greater than 1 L therefore Foley catheter placed. Otherwise no complaints this morning  Review of Systems Otherwise negative except as per HPI, including: General: Denies fever, chills, night sweats or unintended weight loss. Resp: Denies cough, wheezing, shortness of breath. Cardiac: Denies chest pain, palpitations, orthopnea, paroxysmal nocturnal dyspnea. GI: Denies abdominal pain, nausea, vomiting, diarrhea or constipation GU: Denies dysuria, frequency, hesitancy or incontinence MS: Denies muscle aches, joint pain or swelling Neuro: Denies headache, neurologic deficits (focal weakness, numbness, tingling), abnormal gait Psych: Denies anxiety, depression, SI/HI/AVH Skin: Denies new rashes or lesions ID: Denies sick contacts, exotic exposures, travel  Examination: Constitutional: Not in acute distress, bilateral temporal wasting Respiratory: Clear to auscultation bilaterally Cardiovascular: Normal sinus rhythm, no rubs Abdomen: Nontender nondistended good bowel sounds Musculoskeletal: No edema noted Skin: No rashes seen Neurologic: CN 2-12 grossly intact.  And nonfocal Psychiatric: Normal judgment and insight. Alert and oriented x 3. Normal mood.   Left neck nontunneled HD catheter noted without any sign of infection Foley placed 6/8  Objective: Vitals:   12/29/20 1956 12/29/20 2300 12/30/20 0003 12/30/20 0500  BP: 97/76 (!) 76/51 120/64   Pulse: 76     Resp: 17     Temp: 98.6 F (37 C)     TempSrc: Oral     SpO2: 95%     Weight:    63.5 kg  Height:        Intake/Output Summary (Last 24 hours) at 12/30/2020 0804 Last data filed at 12/30/2020 0615 Gross per 24 hour  Intake 240 ml  Output 1730 ml  Net -1490 ml   Filed Weights   12/28/20 0500 12/29/20 1429 12/30/20  0500  Weight: 64 kg 63.3 kg 63.5 kg     Data Reviewed:   CBC: Recent Labs  Lab 12/30/20 0106  WBC 10.8*  HGB 7.6*  HCT 24.6*  MCV 97.2  PLT 240   Basic Metabolic Panel: Recent Labs  Lab 12/26/20 0227 12/27/20 0439 12/28/20 0403 12/29/20 0414 12/30/20 0106  NA 137 136 136 138 138  K 3.7 3.1* 4.2 3.8 3.7  CL 100 97* 102 101 100  CO2 27 26 19* 22 27  GLUCOSE 92 93 82 87 89  BUN 47* 57* 69* 76* 33*  CREATININE 4.53* 5.25* 5.85* 6.27* 3.52*  CALCIUM 8.1* 7.9* 7.8* 7.7* 7.7*  MG  --   --   --   --  1.6*  PHOS 5.8* 7.0* 7.0* 7.4* 4.2   GFR: Estimated Creatinine Clearance: 13.5 mL/min (A) (by C-G formula based on SCr of 3.52 mg/dL (H)). Liver Function Tests: Recent Labs  Lab 12/26/20 0227 12/27/20 0439 12/28/20 0403 12/29/20 0414 12/30/20 0106  ALBUMIN 2.2* 2.4* 2.5* 2.5* 2.4*   No results for input(s): LIPASE, AMYLASE in the last 168 hours. No results for input(s): AMMONIA in the last 168 hours. Coagulation Profile: No results for input(s): INR, PROTIME in  the last 168 hours. Cardiac Enzymes: No results for input(s): CKTOTAL, CKMB, CKMBINDEX, TROPONINI in the last 168 hours. BNP (last 3 results) Recent Labs    01/09/20 1443  PROBNP 1,018*   HbA1C: No results for input(s): HGBA1C in the last 72 hours. CBG: No results for input(s): GLUCAP in the last 168 hours. Lipid Profile: No results for input(s): CHOL, HDL, LDLCALC, TRIG, CHOLHDL, LDLDIRECT in the last 72 hours. Thyroid Function Tests: No results for input(s): TSH, T4TOTAL, FREET4, T3FREE, THYROIDAB in the last 72 hours. Anemia Panel: No results for input(s): VITAMINB12, FOLATE, FERRITIN, TIBC, IRON, RETICCTPCT in the last 72 hours. Sepsis Labs: No results for input(s): PROCALCITON, LATICACIDVEN in the last 168 hours.  No results found for this or any previous visit (from the past 240 hour(s)).       Radiology Studies: No results found.      Scheduled Meds:  (feeding supplement)  PROSource Plus  30 mL Oral BID BM   aspirin  81 mg Oral QPC supper   atorvastatin  20 mg Oral Daily   Chlorhexidine Gluconate Cloth  6 each Topical Daily   feeding supplement  1 Container Oral TID BM   ferrous sulfate  325 mg Oral Q breakfast   heparin injection (subcutaneous)  5,000 Units Subcutaneous Q8H   midodrine  10 mg Oral TID WC   senna-docusate  1 tablet Oral BID   Continuous Infusions:   LOS: 20 days   Time spent= 35 mins    Jonika Critz Arsenio Loader, MD [facility]  If 7PM-7AM, please contact night-coverage  12/30/2020, 8:04 AM

## 2020-12-30 NOTE — Progress Notes (Signed)
Nutrition Follow-up   DOCUMENTATION CODES:  Not applicable  INTERVENTION:  Increase to 38ml Prosource Plus po TID, each supplement provides 100 kcals and 15 grams of protein Magic cup BID with meals, each supplement provides 290 kcal and 9 grams of protein Continue Boost Breeze po TID, each supplement provides 250 kcal and 9 grams of protein  NUTRITION DIAGNOSIS:  Inadequate oral intake related to poor appetite as evidenced by per patient/family report. ongoing  GOAL:  Patient will meet greater than or equal to 90% of their needs progressing  MONITOR:  PO intake, Supplement acceptance, Labs, Weight trends, I & O's  REASON FOR ASSESSMENT:  Consult Diet education  ASSESSMENT:  Pt admitted with ARF superimposed on CKD II; uremia; metabolic acidosis. PMH includes thoracic aortic aneurysm s/p stent graft repair, CAD s/p CABG (2010), AAA, HTN, CHF, BPH with urinary retention requiring intemittent self-catheterization, Afib.   5/30 s/p temp HD cath placement; trial HD initiation   6/03 re-Insertion of Non-tunneled Central Venous Catheter without US guidance change over wire for malfunctioning catheter  Pt to discharge to SNF once cleared by Nephrology. Per Nephrology, pt has had improvement in UOP and is awake and alert without uremic symptoms, but BUN/Cr has continued to rise. Pt had another HD session yesterday (last session prior to that was 6/3). Yesterday's HD was to be without fluid removal, just for clearance. Note BUN/Cr have improved from yesterday (see below).   Pt's appetite has been fair. 2 meal completions documented as 25-50% since last RD assessment. Per RN, pt doing well with supplements. Will increase supplements given good acceptance.    UOP: 1758ml x24 hours I/O: -11,447ml since admit  Admission weight: 69.7 kg Current weight: 63.5 kg  No edema noted per RN assessment  Medications:   (feeding supplement) PROSource Plus  30 mL Oral BID BM   aspirin  81 mg Oral  QPC supper   atorvastatin  20 mg Oral Daily   Chlorhexidine Gluconate Cloth  6 each Topical Daily   feeding supplement  1 Container Oral TID BM   ferrous sulfate  325 mg Oral Q breakfast   heparin injection (subcutaneous)  5,000 Units Subcutaneous Q8H   midodrine  10 mg Oral TID WC   senna-docusate  1 tablet Oral BID   Labs: Recent Labs  Lab 12/28/20 0403 12/29/20 0414 12/30/20 0106  NA 136 138 138  K 4.2 3.8 3.7  CL 102 101 100  CO2 19* 22 27  BUN 69* 76* 33*  CREATININE 5.85* 6.27* 3.52*  CALCIUM 7.8* 7.7* 7.7*  MG  --   --  1.6*  PHOS 7.0* 7.4* 4.2  GLUCOSE 82 87 89   Diet Order:   Diet Order             Diet renal with fluid restriction Fluid restriction: Other (see comments); Room service appropriate? Yes; Fluid consistency: Thin  Diet effective now                  EDUCATION NEEDS:  No education needs have been identified at this time  Skin:  Skin Assessment: Reviewed RN Assessment  Last BM:  6/7  Height:  Ht Readings from Last 1 Encounters:  12/10/20 5\' 8"  (1.727 m)   Weight:  Wt Readings from Last 1 Encounters:  12/30/20 63.5 kg   BMI:  Body mass index is 21.29 kg/m.  Estimated Nutritional Needs:  Kcal:  0932-3557 Protein:  85-95g Fluid:  >1.75L/d    Larkin Ina, MS,  RD, LDN RD pager number and weekend/on-call pager number located in [place].

## 2020-12-31 DIAGNOSIS — N179 Acute kidney failure, unspecified: Secondary | ICD-10-CM | POA: Diagnosis not present

## 2020-12-31 LAB — RENAL FUNCTION PANEL
Albumin: 2.5 g/dL — ABNORMAL LOW (ref 3.5–5.0)
Anion gap: 13 (ref 5–15)
BUN: 50 mg/dL — ABNORMAL HIGH (ref 8–23)
CO2: 25 mmol/L (ref 22–32)
Calcium: 8.1 mg/dL — ABNORMAL LOW (ref 8.9–10.3)
Chloride: 98 mmol/L (ref 98–111)
Creatinine, Ser: 4.53 mg/dL — ABNORMAL HIGH (ref 0.61–1.24)
GFR, Estimated: 12 mL/min — ABNORMAL LOW (ref >60)
Glucose, Bld: 94 mg/dL (ref 70–99)
Phosphorus: 5.9 mg/dL — ABNORMAL HIGH (ref 2.5–4.6)
Potassium: 3.8 mmol/L (ref 3.5–5.1)
Sodium: 136 mmol/L (ref 135–145)

## 2020-12-31 LAB — CBC
HCT: 23.8 % — ABNORMAL LOW (ref 39.0–52.0)
Hemoglobin: 7.6 g/dL — ABNORMAL LOW (ref 13.0–17.0)
MCH: 31.1 pg (ref 26.0–34.0)
MCHC: 31.9 g/dL (ref 30.0–36.0)
MCV: 97.5 fL (ref 80.0–100.0)
Platelets: 214 10*3/uL (ref 150–400)
RBC: 2.44 MIL/uL — ABNORMAL LOW (ref 4.22–5.81)
RDW: 14.8 % (ref 11.5–15.5)
WBC: 14.2 10*3/uL — ABNORMAL HIGH (ref 4.0–10.5)
nRBC: 0 % (ref 0.0–0.2)

## 2020-12-31 LAB — MAGNESIUM: Magnesium: 1.6 mg/dL — ABNORMAL LOW (ref 1.7–2.4)

## 2020-12-31 MED ORDER — MAGNESIUM OXIDE -MG SUPPLEMENT 400 (240 MG) MG PO TABS
800.0000 mg | ORAL_TABLET | ORAL | Status: AC
  Administered 2020-12-31 (×2): 800 mg via ORAL
  Filled 2020-12-31 (×2): qty 2

## 2020-12-31 MED ORDER — SODIUM CHLORIDE 0.9 % IV SOLN: INTRAVENOUS | Status: DC

## 2020-12-31 NOTE — Progress Notes (Signed)
Physical Therapy Treatment Patient Details Name: Andrew [name] MRN: [id number] DOB: 01/23/1932 Today's Date: 12/31/2020    History of Present Illness Pt is an 87 y.o. male admitted 12/10/20 with abnormal blood work. Workup for ARF with uremia. Imaging notable for PNA. S/p temprorary HD cath placement 5/30; iHD initiated. Plan for tunneled HD cath placement 6/13 or 6/14. PMH includes CABG, TAA, AAA, CHF, CKD2, remote afib (not on anticoagulation), back sx.   PT Comments    Today's session focused on transfer training with stedy frame to help build pt's confidence with standing activity. Pt limited by significant anxiety/fearful of falling with all mobility this session, as well as decreased activity tolerance and generalized weakness. Pt's assist needs fluctuating from min guard to maxA+2 for standing. Continue to recommend SNF-level therapies to maximize functional mobility and independence prior to return home.   Follow Up Recommendations  SNF;Supervision for mobility/OOB     Equipment Recommendations  Wheelchair (measurements PT);Wheelchair cushion (measurements PT);Hospital bed    Recommendations for Other Services       Precautions / Restrictions Precautions Precautions: Fall;Other (comment) Precaution Comments: Significant anxiety/fearful of falling; frequent urge to have BM with mobility    Mobility  Bed Mobility Overal bed mobility: Needs Assistance Bed Mobility: Supine to Sit     Supine to sit: Max assist;+2 for safety/equipment     General bed mobility comments: Verbal cues to initiate BLE movement to EOB, modA for trunk elevation, pt with immediate trunk/LE extension starting to slide from EOB requiring return to supine with LEs off EOB; BLEs flexed with feet placed on stedy foot board to give pt "safe space" with bar to hold onto, maxA for trunk elevation with improved ability to maintain flexed posture    Transfers Overall transfer level: Needs  assistance Equipment used: Ambulation equipment used Transfers: Sit to/from Stand Sit to Stand: Min guard;Min assist;Mod assist;Max assist;+2 physical assistance         General transfer comment: Initial min guard for trunk elevation from EOB into stedy standing frame, pt assisting well with BUE support; pt quick to become anxious regarding standing; multiple additional sit<>stands from stedy seat, recliner (x2), BSC (3x) with pt requiring min-maxA+1-2 for various stands, limited by anxiety and increasing fatigue; pt extending trunk back but difficulty achieving adequate hip extension  Ambulation/Gait                 Stairs             Wheelchair Mobility    Modified Rankin (Stroke Patients Only)       Balance Overall balance assessment: Needs assistance Sitting-balance support: Bilateral upper extremity supported;Feet supported;No upper extremity supported Sitting balance-Leahy Scale: Fair     Standing balance support: Bilateral upper extremity supported;During functional activity Standing balance-Leahy Scale: Poor Standing balance comment: Reliant on UE support                            Cognition Arousal/Alertness: Awake/alert Behavior During Therapy: Anxious Overall Cognitive Status: No family/caregiver present to determine baseline cognitive functioning Area of Impairment: Attention;Memory;Following commands;Safety/judgement;Awareness;Problem solving                   Current Attention Level: Sustained Memory: Decreased short-term memory Following Commands: Follows one step commands with increased time;Follows one step commands inconsistently Safety/Judgement: Decreased awareness of deficits;Decreased awareness of safety Awareness: Emergent Problem Solving: Slow processing;Decreased initiation;Requires verbal cues;Requires tactile cues;Difficulty sequencing General Comments: Limited  by significant anxiety/fear of falling, difficult to  redirect this session despite max encouragement and cues for relaxation technique; difficult to separate this from other areas of potential cognitive impairment as pt very distracted related to fear      Exercises      General Comments        Pertinent Vitals/Pain Pain Assessment: Faces Faces Pain Scale: Hurts a little bit Pain Location: Generalized Pain Descriptors / Indicators: Discomfort;Tiring Pain Intervention(s): Monitored during session    Home Living                      Prior Function            PT Goals (current goals can now be found in the care plan section) Progress towards PT goals: Not progressing toward goals - comment (limited by anxiety/fearful of falling)    Frequency    Min 2X/week      PT Plan Current plan remains appropriate    Co-evaluation              AM-PAC PT "6 Clicks" Mobility   Outcome Measure  Help needed turning from your back to your side while in a flat bed without using bedrails?: A Lot Help needed moving from lying on your back to sitting on the side of a flat bed without using bedrails?: A Lot Help needed moving to and from a bed to a chair (including a wheelchair)?: A Lot Help needed standing up from a chair using your arms (e.g., wheelchair or bedside chair)?: A Lot Help needed to walk in hospital room?: Total Help needed climbing 3-5 steps with a railing? : Total 6 Click Score: 10    End of Session Equipment Utilized During Treatment: Gait belt;Oxygen Activity Tolerance: Patient limited by fatigue;Other (comment) (pt limited by anxiety/fearful of falling) Patient left: in chair;with call bell/phone within reach;with chair alarm set Nurse Communication: Mobility status;Need for lift equipment (spoke with NT regarding transfer with stedy vs. maximove lift pending anxiety (maximove lift pad under pt)) PT Visit Diagnosis: Muscle weakness (generalized) (M62.81);Difficulty in walking, not elsewhere classified  (R26.2)     Time: 3335-4562 PT Time Calculation (min) (ACUTE ONLY): 39 min  Charges:  $Therapeutic Activity: 38-52 mins                     Andrew [name], PT, DPT Acute Rehabilitation Services  Pager [phone] [place] 12/31/2020, 5:24 PM

## 2020-12-31 NOTE — Progress Notes (Signed)
Out pt HD arrangements: Have been requested to submit pt for out pt HD. Have submitted med recs to [facility] admissions. Please advise of d/c date  Thanks Linus Orn [facility] [phone]

## 2020-12-31 NOTE — Progress Notes (Signed)
Patient ID: ELMO RIO, male   DOB: 10/29/1931, 87 y.o.   MRN: [id number] S: No complaints and no events overnight. O:BP (!) 147/79 (BP Location: Left Arm)   Pulse 68   Temp 98.4 F (36.9 C) (Oral)   Resp 18   Ht 5\' 8"  (1.727 m)   Wt 63.8 kg   SpO2 98%   BMI 21.39 kg/m   Intake/Output Summary (Last 24 hours) at 12/31/2020 1023 Last data filed at 12/30/2020 2100 Gross per 24 hour  Intake --  Output 700 ml  Net -700 ml   Intake/Output: I/O last 3 completed shifts: In: -  Out: 2050 [Urine:2050]  Intake/Output this shift:  No intake/output data recorded. Weight change: 0.5 kg Gen: NAD CVS: RRR Resp: CTA Abd: Benign Ext: no edema  Recent Labs  Lab 12/25/20 0229 12/26/20 0227 12/27/20 0439 12/28/20 0403 12/29/20 0414 12/30/20 0106 12/31/20 0128  NA 138 137 136 136 138 138 136  K 3.7 3.7 3.1* 4.2 3.8 3.7 3.8  CL 100 100 97* 102 101 100 98  CO2 26 27 26  19* 22 27 25   GLUCOSE 97 92 93 82 87 89 94  BUN 31* 47* 57* 69* 76* 33* 50*  CREATININE 3.23* 4.53* 5.25* 5.85* 6.27* 3.52* 4.53*  ALBUMIN 2.3* 2.2* 2.4* 2.5* 2.5* 2.4* 2.5*  CALCIUM 8.1* 8.1* 7.9* 7.8* 7.7* 7.7* 8.1*  PHOS 3.4 5.8* 7.0* 7.0* 7.4* 4.2 5.9*   Liver Function Tests: Recent Labs  Lab 12/29/20 0414 12/30/20 0106 12/31/20 0128  ALBUMIN 2.5* 2.4* 2.5*   No results for input(s): LIPASE, AMYLASE in the last 168 hours. No results for input(s): AMMONIA in the last 168 hours. CBC: Recent Labs  Lab 12/30/20 0106 12/31/20 0128  WBC 10.8* 14.2*  HGB 7.6* 7.6*  HCT 24.6* 23.8*  MCV 97.2 97.5  PLT 230 214   Cardiac Enzymes: No results for input(s): CKTOTAL, CKMB, CKMBINDEX, TROPONINI in the last 168 hours. CBG: No results for input(s): GLUCAP in the last 168 hours.  Iron Studies: No results for input(s): IRON, TIBC, TRANSFERRIN, FERRITIN in the last 72 hours. Studies/Results: No results found.  (feeding supplement) PROSource Plus  30 mL Oral TID BM   aspirin  81 mg Oral QPC supper    atorvastatin  20 mg Oral Daily   Chlorhexidine Gluconate Cloth  6 each Topical Daily   feeding supplement  1 Container Oral TID BM   ferrous sulfate  325 mg Oral Q breakfast   heparin injection (subcutaneous)  5,000 Units Subcutaneous Q8H   magnesium oxide  800 mg Oral Q4H   midodrine  10 mg Oral TID WC   senna-docusate  1 tablet Oral BID    BMET    Component Value Date/Time   NA 136 12/31/2020 0128   NA 139 03/18/2020 1456   K 3.8 12/31/2020 0128   CL 98 12/31/2020 0128   CO2 25 12/31/2020 0128   GLUCOSE 94 12/31/2020 0128   BUN 50 (H) 12/31/2020 0128   BUN 44 (H) 03/18/2020 1456   CREATININE 4.53 (H) 12/31/2020 0128   CREATININE 1.32 (H) 08/19/2019 1358   CALCIUM 8.1 (L) 12/31/2020 0128   GFRNONAA 12 (L) 12/31/2020 0128   GFRAA 71 03/18/2020 1456   CBC    Component Value Date/Time   WBC 14.2 (H) 12/31/2020 0128   RBC 2.44 (L) 12/31/2020 0128   HGB 7.6 (L) 12/31/2020 0128   HCT 23.8 (L) 12/31/2020 0128   PLT 214 12/31/2020 0128   MCV  97.5 12/31/2020 0128   MCH 31.1 12/31/2020 0128   MCHC 31.9 12/31/2020 0128   RDW 14.8 12/31/2020 0128   LYMPHSABS 0.2 (L) 12/10/2020 1713   MONOABS 0.8 12/10/2020 1713   EOSABS 0.5 12/10/2020 1713   BASOSABS 0.1 12/10/2020 1713     Assessment/Plan:   AKI, non-oliguric - Presumably due to ischemic ATN in setting of hypotension and volume depletion.  Started on HD 12/20/20 and last HD on 12/24/20 with increased UOP up to 900 cc's for the last 2 days. He is awake and alert without uremic symptoms, however his BUN/Cr continue to rise.     Non-tunneled HD catheter placed by IR on 12/20/20 Had HD 12/29/20 for clearance only (rising BUN). Remains non-oliguric and will follow BUN/Cr and decide on more HD over the next 24 hours. Will give some IVF's today as he has had poor po intake and follow. If BUN/Cr continue to climb will plan for HD tomorrow and then pull HD catheter.  IF BUN/Cr continue to climb will then need [facility] placement on Monday or  Tuesday. Anemia - s/p ESA, transfuse prn Urinary retention - acute on chronic s/p foley cath placed 12/29/20 Acute hypoxic respiratory failure - associated with CAP.   CAP - resolved with abx Hypotension - currently on midodrine Disposition - pt with significant debility and needs PT/OT.  Will need to see if his insurance will pay for outpatient dialysis for AKI if his renal function does not recover.  Donetta Potts, MD [employer] [phone]

## 2020-12-31 NOTE — Plan of Care (Signed)

## 2020-12-31 NOTE — Progress Notes (Signed)
PROGRESS NOTE    Andrew [name]  NGE:[id number] DOB: 1931/11/05 DOA: 12/10/2020 PCP: [name], MD   Brief Narrative:  87 y.o. male with a history of thoracic aortic aneurysm status post stent graft repair, AAA, CABG in 2010, chronic HFpEF, BPH with urinary retention, CKD II, and remote atrial fibrillation not anticoagulated who presented to the ED 5/20 prompted by PCP for abnormal blood work which was drawn in the setting of cough that hadn't improved with augmentin. He also had been losing appetite and feeling unwell.   Labs were notable for mild leukocytosis, worsening anemia, renal failure. CT the chest, abdomen, and pelvis is notable for new left upper lobe infiltrate, unchanged stent graft repair of thoracic aortic aneurysm, and grossly stable infrarenal AAA.  Patient was treated with rocephin, azithromycin, albuterol, and IV fluids in the ED. SBP nadir was in 70's shortly after admission. Creatinine was 8.71 worsening to 9.32 from previous of 1.09 in Aug 2021. Nephrology was consulted. Further goals of care discussions have continued. The patient and family now confirm their desire for trial of hemodialysis if there continues to be no renal recovery.  Nontunneled HD catheter was placed 5/30.  Nephrology providing intermittent dialysis.    Assessment & Plan:   Principal Problem:   AKI (acute kidney injury) ([place]) Active Problems:   Thoracic aortic aneurysm without rupture ([place])   Coronary artery disease   Urinary retention   AAA (abdominal aortic aneurysm) without rupture ([facility])   Essential hypertension   Normocytic anemia   Chronic diastolic CHF (congestive heart failure) ([place])   Community acquired pneumonia   Acute kidney injury, nonoliguric.  Suspect ATN  - Renal function continues to worsen.  Adequate urine output.  Creatinine  - Nontunneled HD placed 5/30 - Nephro following.  Dialysis per nephro discretion.  Status post HD 6/8 -Monitor  electrolytes  Leukocytosis -Monitor for now.  No evidence of infection  Acute on chronic urinary retention - Greater than 1 L urine retention.  Foley catheter placed 6/8.  Will eventually transition to straight cath q8hrs (he does this at home)  Acute hypoxic respiratory failure: Due to CAP, possible aspiration, possible renal failure-related volume overload.  -Recently completed antibiotics.  For now continue as needed bronchodilators.  I-S/flutter.  Out of bed to chair.  Supplemental O2.  Aspiration precautions.  Left upper lobe community-acquired pneumonia - Completed 5 days of Rocephin and azithromycin   Hypotension, intermittent - Continue midodrine 10 mg 3 times daily. - Random cortisol and TSH-normal   CAD: History of PCI and CABG 2010. No anginal complaints currently.  --Continue aspirin and statin   Chronic HFpEF: Echo 5/26 unchanged from prior with LVEF 60-65%, unevaluated diastolic function, no RWMAs. Normal RV, no severe valvular disease.  -HD per nephrology   Thoracic aortic aneurysm s/p repair:  -Stable   AAA: Appears stable on CT in ED  - VVS appointment with repeat U/S on 6/21 with Dr. Carlis Abbott.    Iron deficiency anemia: No acute bleeding noted. Hgb rebounding s/p aranesp per nephrology. -Iron supplements with bowel regimen    DVT prophylaxis: Subcu heparin Code Status: Full Family Communication: Wife at bedside Status is: Inpatient  Remains inpatient appropriate because:Inpatient level of care appropriate due to severity of illness   Dispo: The patient is from: Home              Anticipated d/c is to: SNF              Patient currently  is not medically stable to d/c.  Dialysis plans per nephro.  Disposition to be determined depending on his long-term dialysis needs.   Difficult to place patient No       Nutritional status  Nutrition Problem: Inadequate oral intake Etiology: poor appetite  Signs/Symptoms: per patient/family  report  Interventions: Boost Breeze, Prostat, Refer to RD note for recommendations  Body mass index is 21.39 kg/m.           Subjective: No complaints, urinating well.  Review of Systems Otherwise negative except as per HPI, including: General: Denies fever, chills, night sweats or unintended weight loss. Resp: Denies cough, wheezing, shortness of breath. Cardiac: Denies chest pain, palpitations, orthopnea, paroxysmal nocturnal dyspnea. GI: Denies abdominal pain, nausea, vomiting, diarrhea or constipation GU: Denies dysuria, frequency, hesitancy or incontinence MS: Denies muscle aches, joint pain or swelling Neuro: Denies headache, neurologic deficits (focal weakness, numbness, tingling), abnormal gait Psych: Denies anxiety, depression, SI/HI/AVH Skin: Denies new rashes or lesions ID: Denies sick contacts, exotic exposures, travel  Examination: Constitutional: Not in acute distress Respiratory: Clear to auscultation bilaterally Cardiovascular: Normal sinus rhythm, no rubs Abdomen: Nontender nondistended good bowel sounds Musculoskeletal: No edema noted Skin: No rashes seen Neurologic: CN 2-12 grossly intact.  And nonfocal Psychiatric: Normal judgment and insight. Alert and oriented x 3. Normal mood.   Left neck nontunneled HD catheter noted without any sign of infection Foley placed 6/8  Objective: Vitals:   12/30/20 1639 12/30/20 1649 12/30/20 2322 12/31/20 0500  BP:  140/80 (!) 147/79   Pulse: (!) 49 71 68   Resp: 18 15 18    Temp:  98.3 F (36.8 C) 98.4 F (36.9 C)   TempSrc:  Oral Oral   SpO2: 99% 100% 98%   Weight:    63.8 kg  Height:        Intake/Output Summary (Last 24 hours) at 12/31/2020 0758 Last data filed at 12/30/2020 2100 Gross per 24 hour  Intake --  Output 700 ml  Net -700 ml   Filed Weights   12/29/20 1429 12/30/20 0500 12/31/20 0500  Weight: 63.3 kg 63.5 kg 63.8 kg     Data Reviewed:   CBC: Recent Labs  Lab 12/30/20 0106  12/31/20 0128  WBC 10.8* 14.2*  HGB 7.6* 7.6*  HCT 24.6* 23.8*  MCV 97.2 97.5  PLT 230 088   Basic Metabolic Panel: Recent Labs  Lab 12/27/20 0439 12/28/20 0403 12/29/20 0414 12/30/20 0106 12/31/20 0128  NA 136 136 138 138 136  K 3.1* 4.2 3.8 3.7 3.8  CL 97* 102 101 100 98  CO2 26 19* 22 27 25   GLUCOSE 93 82 87 89 94  BUN 57* 69* 76* 33* 50*  CREATININE 5.25* 5.85* 6.27* 3.52* 4.53*  CALCIUM 7.9* 7.8* 7.7* 7.7* 8.1*  MG  --   --   --  1.6* 1.6*  PHOS 7.0* 7.0* 7.4* 4.2 5.9*   GFR: Estimated Creatinine Clearance: 10.6 mL/min (A) (by C-G formula based on SCr of 4.53 mg/dL (H)). Liver Function Tests: Recent Labs  Lab 12/27/20 0439 12/28/20 0403 12/29/20 0414 12/30/20 0106 12/31/20 0128  ALBUMIN 2.4* 2.5* 2.5* 2.4* 2.5*   No results for input(s): LIPASE, AMYLASE in the last 168 hours. No results for input(s): AMMONIA in the last 168 hours. Coagulation Profile: No results for input(s): INR, PROTIME in the last 168 hours. Cardiac Enzymes: No results for input(s): CKTOTAL, CKMB, CKMBINDEX, TROPONINI in the last 168 hours. BNP (last 3 results) Recent Labs  01/09/20 1443  PROBNP 1,018*   HbA1C: No results for input(s): HGBA1C in the last 72 hours. CBG: No results for input(s): GLUCAP in the last 168 hours. Lipid Profile: No results for input(s): CHOL, HDL, LDLCALC, TRIG, CHOLHDL, LDLDIRECT in the last 72 hours. Thyroid Function Tests: Recent Labs    12/30/20 0838  TSH 4.178   Anemia Panel: No results for input(s): VITAMINB12, FOLATE, FERRITIN, TIBC, IRON, RETICCTPCT in the last 72 hours. Sepsis Labs: No results for input(s): PROCALCITON, LATICACIDVEN in the last 168 hours.  No results found for this or any previous visit (from the past 240 hour(s)).       Radiology Studies: No results found.      Scheduled Meds:  (feeding supplement) PROSource Plus  30 mL Oral TID BM   aspirin  81 mg Oral QPC supper   atorvastatin  20 mg Oral Daily    Chlorhexidine Gluconate Cloth  6 each Topical Daily   feeding supplement  1 Container Oral TID BM   ferrous sulfate  325 mg Oral Q breakfast   heparin injection (subcutaneous)  5,000 Units Subcutaneous Q8H   midodrine  10 mg Oral TID WC   senna-docusate  1 tablet Oral BID   Continuous Infusions:   LOS: 21 days   Time spent= 35 mins    Christophe Rising Arsenio Loader, MD [facility]  If 7PM-7AM, please contact night-coverage  12/31/2020, 7:58 AM

## 2021-01-01 DIAGNOSIS — N179 Acute kidney failure, unspecified: Secondary | ICD-10-CM | POA: Diagnosis not present

## 2021-01-01 LAB — RENAL FUNCTION PANEL
Albumin: 2.3 g/dL — ABNORMAL LOW (ref 3.5–5.0)
Anion gap: 13 (ref 5–15)
BUN: 64 mg/dL — ABNORMAL HIGH (ref 8–23)
CO2: 24 mmol/L (ref 22–32)
Calcium: 8.1 mg/dL — ABNORMAL LOW (ref 8.9–10.3)
Chloride: 102 mmol/L (ref 98–111)
Creatinine, Ser: 5.21 mg/dL — ABNORMAL HIGH (ref 0.61–1.24)
GFR, Estimated: 10 mL/min — ABNORMAL LOW (ref >60)
Glucose, Bld: 88 mg/dL (ref 70–99)
Phosphorus: 6.7 mg/dL — ABNORMAL HIGH (ref 2.5–4.6)
Potassium: 3.7 mmol/L (ref 3.5–5.1)
Sodium: 139 mmol/L (ref 135–145)

## 2021-01-01 LAB — CBC
HCT: 23.4 % — ABNORMAL LOW (ref 39.0–52.0)
Hemoglobin: 7.4 g/dL — ABNORMAL LOW (ref 13.0–17.0)
MCH: 30.7 pg (ref 26.0–34.0)
MCHC: 31.6 g/dL (ref 30.0–36.0)
MCV: 97.1 fL (ref 80.0–100.0)
Platelets: 202 10*3/uL (ref 150–400)
RBC: 2.41 MIL/uL — ABNORMAL LOW (ref 4.22–5.81)
RDW: 14.8 % (ref 11.5–15.5)
WBC: 10.8 10*3/uL — ABNORMAL HIGH (ref 4.0–10.5)
nRBC: 0 % (ref 0.0–0.2)

## 2021-01-01 LAB — PREPARE RBC (CROSSMATCH)

## 2021-01-01 LAB — MRSA NEXT GEN BY PCR, NASAL: MRSA by PCR Next Gen: NOT DETECTED

## 2021-01-01 LAB — MAGNESIUM: Magnesium: 1.8 mg/dL (ref 1.7–2.4)

## 2021-01-01 MED ORDER — HEPARIN SODIUM (PORCINE) 1000 UNIT/ML IJ SOLN
1000.0000 [IU] | INTRAMUSCULAR | Status: DC | PRN
  Filled 2021-01-01: qty 1

## 2021-01-01 MED ORDER — SODIUM CHLORIDE 0.9% IV SOLUTION: Freq: Once | INTRAVENOUS | Status: DC

## 2021-01-01 MED ORDER — HEPARIN SODIUM (PORCINE) 1000 UNIT/ML IJ SOLN
INTRAMUSCULAR | Status: AC
  Administered 2021-01-01: 2800 [IU] via INTRAVENOUS
  Filled 2021-01-01: qty 3

## 2021-01-01 NOTE — Progress Notes (Signed)
Foley cath d/c'd per order.

## 2021-01-01 NOTE — Progress Notes (Signed)
PROGRESS NOTE    Andrew [name]  MWN:[id number] DOB: 02/06/1932 DOA: 12/10/2020 PCP: [name], MD   Brief Narrative:  87 y.o. male with a history of thoracic aortic aneurysm status post stent graft repair, AAA, CABG in 2010, chronic HFpEF, BPH with urinary retention, CKD II, and remote atrial fibrillation not anticoagulated who presented to the ED 5/20 prompted by PCP for abnormal blood work which was drawn in the setting of cough that hadn't improved with augmentin. He also had been losing appetite and feeling unwell.   Labs were notable for mild leukocytosis, worsening anemia, renal failure. CT the chest, abdomen, and pelvis is notable for new left upper lobe infiltrate, unchanged stent graft repair of thoracic aortic aneurysm, and grossly stable infrarenal AAA.  Patient was treated with rocephin, azithromycin, albuterol, and IV fluids in the ED. SBP nadir was in 70's shortly after admission. Creatinine was 8.71 worsening to 9.32 from previous of 1.09 in Aug 2021. Nephrology was consulted. Further goals of care discussions have continued. The patient and family now confirm their desire for trial of hemodialysis if there continues to be no renal recovery.  Nontunneled HD catheter was placed 5/30.  Nephrology providing intermittent dialysis.    Assessment & Plan:   Principal Problem:   AKI (acute kidney injury) ([place]) Active Problems:   Thoracic aortic aneurysm without rupture ([place])   Coronary artery disease   Urinary retention   AAA (abdominal aortic aneurysm) without rupture (H[facility])   Essential hypertension   Normocytic anemia   Chronic diastolic CHF (congestive heart failure) ([place])   Community acquired pneumonia   Acute kidney injury, nonoliguric.  Suspect ATN  - Renal function continues to worsen.  Adequate urine output.  Creatinine today 5.21 - Nontunneled HD placed 5/30 - Nephro following.  Dialysis per nephro discretion.   -Monitor electrolytes  Leukocytosis -Monitor  for now.  No evidence of infection  Acute on chronic urinary retention - Greater than 1 L urine retention.  We will remove his Foley catheter.  Straight cath every 8 hours which he does at home.  Acute hypoxic respiratory failure: Due to CAP, possible aspiration, possible renal failure-related volume overload.  -Resolved.  Recently completed antibiotics.  For now continue as needed bronchodilators.  I-S/flutter.  Out of bed to chair.  Supplemental O2.  Aspiration precautions.  Left upper lobe community-acquired pneumonia - Completed 5 days of Rocephin and azithromycin   Hypotension, intermittent - Continue midodrine 10 mg 3 times daily. - Random cortisol and TSH-normal   CAD: History of PCI and CABG 2010. No anginal complaints currently.  --Continue aspirin and statin   Chronic HFpEF: Echo 5/26 unchanged from prior with LVEF 60-65%, unevaluated diastolic function, no RWMAs. Normal RV, no severe valvular disease.  -HD per nephrology   Thoracic aortic aneurysm s/p repair:  -Stable   AAA: Appears stable on CT in ED  - VVS appointment with repeat U/S on 6/21 with Dr. Carlis Abbott.    Iron deficiency anemia: No acute bleeding noted.  -Iron supplements with bowel regimen. Hb 7.4 -Hemoglobin slowly downtrending.  Likely iatrogenic no obvious evidence of bleeding.  Will give 1 unit PRBC.    DVT prophylaxis: Subcu heparin Code Status: Full Family Communication: Wife at bedside Status is: Inpatient  Remains inpatient appropriate because:Inpatient level of care appropriate due to severity of illness   Dispo: The patient is from: Home              Anticipated d/c is to: SNF  Patient currently is not medically stable to d/c.  Dialysis plans per nephro.  Disposition to be determined depending on his long-term dialysis needs.   Difficult to place patient No   Nutritional status  Nutrition Problem: Inadequate oral intake Etiology: poor appetite  Signs/Symptoms: per  patient/family report  Interventions: Boost Breeze, Prostat, Refer to RD note for recommendations  Body mass index is 21.39 kg/m.      Subjective: No complaints doing okay.  No signs of bleeding.  Review of Systems Otherwise negative except as per HPI, including: General: Denies fever, chills, night sweats or unintended weight loss. Resp: Denies cough, wheezing, shortness of breath. Cardiac: Denies chest pain, palpitations, orthopnea, paroxysmal nocturnal dyspnea. GI: Denies abdominal pain, nausea, vomiting, diarrhea or constipation GU: Denies dysuria, frequency, hesitancy or incontinence MS: Denies muscle aches, joint pain or swelling Neuro: Denies headache, neurologic deficits (focal weakness, numbness, tingling), abnormal gait Psych: Denies anxiety, depression, SI/HI/AVH Skin: Denies new rashes or lesions ID: Denies sick contacts, exotic exposures, travel  Examination: Constitutional: Not in acute distress Respiratory: Clear to auscultation bilaterally Cardiovascular: Normal sinus rhythm, no rubs Abdomen: Nontender nondistended good bowel sounds Musculoskeletal: No edema noted Skin: No rashes seen Neurologic: CN 2-12 grossly intact.  And nonfocal Psychiatric: Normal judgment and insight. Alert and oriented x 3. Normal mood.   Left neck nontunneled HD catheter noted without any sign of infection Foley placed 6/8  Objective: Vitals:   12/31/20 1049 12/31/20 1501 12/31/20 1948 01/01/21 0529  BP: 94/63 (!) 150/70 (!) 163/81 112/73  Pulse:  (!) 54 70 71  Resp:  20 19   Temp:  97.8 F (36.6 C) 98.4 F (36.9 C) 97.9 F (36.6 C)  TempSrc:   Oral   SpO2:  90% 100% 100%  Weight:      Height:        Intake/Output Summary (Last 24 hours) at 01/01/2021 1006 Last data filed at 01/01/2021 0428 Gross per 24 hour  Intake 1047.53 ml  Output 660 ml  Net 387.53 ml   Filed Weights   12/29/20 1429 12/30/20 0500 12/31/20 0500  Weight: 63.3 kg 63.5 kg 63.8 kg     Data  Reviewed:   CBC: Recent Labs  Lab 12/30/20 0106 12/31/20 0128 01/01/21 0349  WBC 10.8* 14.2* 10.8*  HGB 7.6* 7.6* 7.4*  HCT 24.6* 23.8* 23.4*  MCV 97.2 97.5 97.1  PLT 230 214 161   Basic Metabolic Panel: Recent Labs  Lab 12/28/20 0403 12/29/20 0414 12/30/20 0106 12/31/20 0128 01/01/21 0349  NA 136 138 138 136 139  K 4.2 3.8 3.7 3.8 3.7  CL 102 101 100 98 102  CO2 19* 22 27 25 24   GLUCOSE 82 87 89 94 88  BUN 69* 76* 33* 50* 64*  CREATININE 5.85* 6.27* 3.52* 4.53* 5.21*  CALCIUM 7.8* 7.7* 7.7* 8.1* 8.1*  MG  --   --  1.6* 1.6* 1.8  PHOS 7.0* 7.4* 4.2 5.9* 6.7*   GFR: Estimated Creatinine Clearance: 9.2 mL/min (A) (by C-G formula based on SCr of 5.21 mg/dL (H)). Liver Function Tests: Recent Labs  Lab 12/28/20 0403 12/29/20 0414 12/30/20 0106 12/31/20 0128 01/01/21 0349  ALBUMIN 2.5* 2.5* 2.4* 2.5* 2.3*   No results for input(s): LIPASE, AMYLASE in the last 168 hours. No results for input(s): AMMONIA in the last 168 hours. Coagulation Profile: No results for input(s): INR, PROTIME in the last 168 hours. Cardiac Enzymes: No results for input(s): CKTOTAL, CKMB, CKMBINDEX, TROPONINI in the last 168 hours. BNP (  last 3 results) Recent Labs    01/09/20 1443  PROBNP 1,018*   HbA1C: No results for input(s): HGBA1C in the last 72 hours. CBG: No results for input(s): GLUCAP in the last 168 hours. Lipid Profile: No results for input(s): CHOL, HDL, LDLCALC, TRIG, CHOLHDL, LDLDIRECT in the last 72 hours. Thyroid Function Tests: Recent Labs    12/30/20 0838  TSH 4.178   Anemia Panel: No results for input(s): VITAMINB12, FOLATE, FERRITIN, TIBC, IRON, RETICCTPCT in the last 72 hours. Sepsis Labs: No results for input(s): PROCALCITON, LATICACIDVEN in the last 168 hours.  No results found for this or any previous visit (from the past 240 hour(s)).       Radiology Studies: No results found.      Scheduled Meds:  (feeding supplement) PROSource Plus  30  mL Oral TID BM   sodium chloride   Intravenous Once   aspirin  81 mg Oral QPC supper   atorvastatin  20 mg Oral Daily   Chlorhexidine Gluconate Cloth  6 each Topical Daily   feeding supplement  1 Container Oral TID BM   ferrous sulfate  325 mg Oral Q breakfast   heparin injection (subcutaneous)  5,000 Units Subcutaneous Q8H   midodrine  10 mg Oral TID WC   senna-docusate  1 tablet Oral BID   Continuous Infusions:  sodium chloride 50 mL/hr at 01/01/21 0736     LOS: 22 days   Time spent= 35 mins    Sevin Farone Arsenio Loader, MD [facility]  If 7PM-7AM, please contact night-coverage  01/01/2021, 10:06 AM

## 2021-01-01 NOTE — Progress Notes (Signed)
Patient ID: KINSER FELLMAN, male   DOB: 05/25/1932, 87 y.o.   MRN: [id number] S: No complaints this am. O:BP (!) 123/50 (BP Location: Left Arm)   Pulse (!) 45   Temp (!) 97.4 F (36.3 C) (Oral)   Resp 12   Ht 5\' 8"  (1.727 m)   Wt 61.2 kg   SpO2 99%   BMI 20.51 kg/m   Intake/Output Summary (Last 24 hours) at 01/01/2021 1237 Last data filed at 01/01/2021 0428 Gross per 24 hour  Intake 1047.53 ml  Output 660 ml  Net 387.53 ml   Intake/Output: I/O last 3 completed shifts: In: 1047.5 [P.O.:180; I.V.:867.5] Out: 1360 [Urine:1360]  Intake/Output this shift:  No intake/output data recorded. Weight change:  QMG:QQPYP, elderly WM in NAD CVS: rrr Resp: bibasilar crackles Abd:+BS, soft, NT/ND Ext: trace edema of presacral region  Recent Labs  Lab 12/26/20 0227 12/27/20 0439 12/28/20 0403 12/29/20 0414 12/30/20 0106 12/31/20 0128 01/01/21 0349  NA 137 136 136 138 138 136 139  K 3.7 3.1* 4.2 3.8 3.7 3.8 3.7  CL 100 97* 102 101 100 98 102  CO2 27 26 19* 22 27 25 24   GLUCOSE 92 93 82 87 89 94 88  BUN 47* 57* 69* 76* 33* 50* 64*  CREATININE 4.53* 5.25* 5.85* 6.27* 3.52* 4.53* 5.21*  ALBUMIN 2.2* 2.4* 2.5* 2.5* 2.4* 2.5* 2.3*  CALCIUM 8.1* 7.9* 7.8* 7.7* 7.7* 8.1* 8.1*  PHOS 5.8* 7.0* 7.0* 7.4* 4.2 5.9* 6.7*   Liver Function Tests: Recent Labs  Lab 12/30/20 0106 12/31/20 0128 01/01/21 0349  ALBUMIN 2.4* 2.5* 2.3*   No results for input(s): LIPASE, AMYLASE in the last 168 hours. No results for input(s): AMMONIA in the last 168 hours. CBC: Recent Labs  Lab 12/30/20 0106 12/31/20 0128 01/01/21 0349  WBC 10.8* 14.2* 10.8*  HGB 7.6* 7.6* 7.4*  HCT 24.6* 23.8* 23.4*  MCV 97.2 97.5 97.1  PLT 230 214 202   Cardiac Enzymes: No results for input(s): CKTOTAL, CKMB, CKMBINDEX, TROPONINI in the last 168 hours. CBG: No results for input(s): GLUCAP in the last 168 hours.  Iron Studies: No results for input(s): IRON, TIBC, TRANSFERRIN, FERRITIN in the last 72  hours. Studies/Results: No results found.  (feeding supplement) PROSource Plus  30 mL Oral TID BM   sodium chloride   Intravenous Once   aspirin  81 mg Oral QPC supper   atorvastatin  20 mg Oral Daily   Chlorhexidine Gluconate Cloth  6 each Topical Daily   feeding supplement  1 Container Oral TID BM   ferrous sulfate  325 mg Oral Q breakfast   heparin injection (subcutaneous)  5,000 Units Subcutaneous Q8H   midodrine  10 mg Oral TID WC   senna-docusate  1 tablet Oral BID    BMET    Component Value Date/Time   NA 139 01/01/2021 0349   NA 139 03/18/2020 1456   K 3.7 01/01/2021 0349   CL 102 01/01/2021 0349   CO2 24 01/01/2021 0349   GLUCOSE 88 01/01/2021 0349   BUN 64 (H) 01/01/2021 0349   BUN 44 (H) 03/18/2020 1456   CREATININE 5.21 (H) 01/01/2021 0349   CREATININE 1.32 (H) 08/19/2019 1358   CALCIUM 8.1 (L) 01/01/2021 0349   GFRNONAA 10 (L) 01/01/2021 0349   GFRAA 71 03/18/2020 1456   CBC    Component Value Date/Time   WBC 10.8 (H) 01/01/2021 0349   RBC 2.41 (L) 01/01/2021 0349   HGB 7.4 (L) 01/01/2021 9509  HCT 23.4 (L) 01/01/2021 0349   PLT 202 01/01/2021 0349   MCV 97.1 01/01/2021 0349   MCH 30.7 01/01/2021 0349   MCHC 31.6 01/01/2021 0349   RDW 14.8 01/01/2021 0349   LYMPHSABS 0.2 (L) 12/10/2020 1713   MONOABS 0.8 12/10/2020 1713   EOSABS 0.5 12/10/2020 1713   BASOSABS 0.1 12/10/2020 1713    Assessment/Plan:   AKI, non-oliguric - Presumably due to ischemic ATN in setting of hypotension and volume depletion.  Started on HD 12/20/20 and last HD on 12/24/20 with increased UOP up to 900 cc's for the last 2 days. He is awake and alert without uremic symptoms, however his BUN/Cr continue to rise.     Non-tunneled HD catheter placed by IR on 12/20/20 Had HD 12/29/20 for clearance only (rising BUN). Remains non-oliguric and will follow BUN/Cr and decide on more HD over the next 24 hours. Will stop IVF's today as he has some crackles at lung bases. For HD today with  UF. IF BUN/Cr continue to climb will then need [facility] placement on Monday or Tuesday. Will likely remove temp HD catheter tomorrow since it has been in for 11 days. Anemia - s/p ESA, transfuse with HD today. Urinary retention - acute on chronic s/p foley cath placed 12/29/20 Acute hypoxic respiratory failure - associated with CAP.   CAP - resolved with abx Hypotension - currently on midodrine Disposition - pt with significant debility and needs PT/OT.  Will need to see if his insurance will pay for outpatient dialysis for AKI if his renal function does not recover.  Donetta Potts, MD [employer] [phone]

## 2021-01-02 DIAGNOSIS — N179 Acute kidney failure, unspecified: Secondary | ICD-10-CM | POA: Diagnosis not present

## 2021-01-02 LAB — RENAL FUNCTION PANEL
Albumin: 2.5 g/dL — ABNORMAL LOW (ref 3.5–5.0)
Anion gap: 9 (ref 5–15)
BUN: 33 mg/dL — ABNORMAL HIGH (ref 8–23)
CO2: 27 mmol/L (ref 22–32)
Calcium: 8.3 mg/dL — ABNORMAL LOW (ref 8.9–10.3)
Chloride: 102 mmol/L (ref 98–111)
Creatinine, Ser: 3.3 mg/dL — ABNORMAL HIGH (ref 0.61–1.24)
GFR, Estimated: 17 mL/min — ABNORMAL LOW (ref >60)
Glucose, Bld: 92 mg/dL (ref 70–99)
Phosphorus: 4.3 mg/dL (ref 2.5–4.6)
Potassium: 4.2 mmol/L (ref 3.5–5.1)
Sodium: 138 mmol/L (ref 135–145)

## 2021-01-02 LAB — TYPE AND SCREEN
ABO/RH(D): O POS
Antibody Screen: NEGATIVE
Unit division: 0

## 2021-01-02 LAB — CBC
HCT: 26.8 % — ABNORMAL LOW (ref 39.0–52.0)
Hemoglobin: 8.4 g/dL — ABNORMAL LOW (ref 13.0–17.0)
MCH: 30.4 pg (ref 26.0–34.0)
MCHC: 31.3 g/dL (ref 30.0–36.0)
MCV: 97.1 fL (ref 80.0–100.0)
Platelets: 201 10*3/uL (ref 150–400)
RBC: 2.76 MIL/uL — ABNORMAL LOW (ref 4.22–5.81)
RDW: 16.5 % — ABNORMAL HIGH (ref 11.5–15.5)
WBC: 11 10*3/uL — ABNORMAL HIGH (ref 4.0–10.5)
nRBC: 0 % (ref 0.0–0.2)

## 2021-01-02 LAB — BPAM RBC
Blood Product Expiration Date: 202207082359
ISSUE DATE / TIME: 202206111228
Unit Type and Rh: 5100

## 2021-01-02 LAB — MAGNESIUM: Magnesium: 1.7 mg/dL (ref 1.7–2.4)

## 2021-01-02 MED ORDER — SALINE SPRAY 0.65 % NA SOLN
1.0000 | NASAL | Status: DC | PRN
  Filled 2021-01-02: qty 44

## 2021-01-02 NOTE — Progress Notes (Signed)
PROGRESS NOTE    Andrew [name]  DUK:[id number] DOB: [birth year] DOA: 12/10/2020 PCP: [name], MD   Brief Narrative:  87 y.o. male with a history of thoracic aortic aneurysm status post stent graft repair, AAA, CABG in 2010, chronic HFpEF, BPH with urinary retention, CKD II, and remote atrial fibrillation not anticoagulated who presented to the ED 5/20 prompted by PCP for abnormal blood work which was drawn in the setting of cough that hadn't improved with augmentin. He also had been losing appetite and feeling unwell.   Labs were notable for mild leukocytosis, worsening anemia, renal failure. CT the chest, abdomen, and pelvis is notable for new left upper lobe infiltrate, unchanged stent graft repair of thoracic aortic aneurysm, and grossly stable infrarenal AAA.  Patient was treated with rocephin, azithromycin, albuterol, and IV fluids in the ED. SBP nadir was in 70's shortly after admission. Creatinine was 8.71 worsening to 9.32 from previous of 1.09 in Aug 2021. Nephrology was consulted. Further goals of care discussions have continued. The patient and family now confirm their desire for trial of hemodialysis if there continues to be no renal recovery.  Nontunneled HD catheter was placed 5/30.  Nephrology providing intermittent dialysis.  Assessment & Plan:   Principal Problem:   AKI (acute kidney injury) ([place]) Active Problems:   Thoracic aortic aneurysm without rupture ([place])   Coronary artery disease   Urinary retention   AAA (abdominal aortic aneurysm) without rupture ([facility])   Essential hypertension   Normocytic anemia   Chronic diastolic CHF (congestive heart failure) ([place])   Community acquired pneumonia   Acute kidney injury, nonoliguric.  Suspect ATN  - Renal function continues to worsen.  Adequate urine output.  Creatinine today 3.3 - Nontunneled HD placed 5/30.  Plans to change to tunneled catheter - Nephro following.  Dialysis per nephro discretion.   -Monitor  electrolytes  Leukocytosis -Monitor for now.  No evidence of infection  Acute on chronic urinary retention - Foley catheter removed 6/11.  Straight in and out cath every 8 hours-he does it at home.  Acute hypoxic respiratory failure: Due to CAP, possible aspiration, possible renal failure-related volume overload.  -Resolved.  Recently completed antibiotics.  For now continue as needed bronchodilators.  I-S/flutter.  Out of bed to chair.  Supplemental O2.  Aspiration precautions.  Left upper lobe community-acquired pneumonia - Completed 5 days of Rocephin and azithromycin   Hypotension, intermittent - Continue midodrine 10 mg 3 times daily. - Random cortisol and TSH-normal   CAD: History of PCI and CABG 2010. No anginal complaints currently.  Intermittent asymptomatic bradycardia/PAC --Continue aspirin and statin - Follows outpatient cardiology, Dr. Debara Pickett   Chronic HFpEF: Echo 5/26 unchanged from prior with LVEF 60-65%, unevaluated diastolic function, no RWMAs. Normal RV, no severe valvular disease.  -HD per nephrology   Thoracic aortic aneurysm s/p repair:  -Stable   AAA: Appears stable on CT in ED  - VVS appointment with repeat U/S on 6/21 with Dr. Carlis Abbott.    Iron deficiency anemia: No acute bleeding noted.  -Iron supplements with bowel regimen. Hb 7.4 - Status post 1 unit PRBC 6/11.  Hemoglobin 8.4    DVT prophylaxis: Subcu heparin Code Status: Full Family Communication: Wife at bedside, Blanch Media called- left a vm Status is: Inpatient  Remains inpatient appropriate because:Inpatient level of care appropriate due to severity of illness   Dispo: The patient is from: Home              Anticipated  d/c is to: SNF              Patient currently is not medically stable to d/c.  Dialysis plans per nephro.  Disposition to be determined depending on his long-term dialysis needs.   Difficult to place patient No   Nutritional status  Nutrition Problem: Inadequate oral  intake Etiology: poor appetite  Signs/Symptoms: per patient/family report  Interventions: Boost Breeze, Prostat, Refer to RD note for recommendations  Body mass index is 20.31 kg/m.      Subjective: Feels ok right now, no complaints.   Review of Systems Otherwise negative except as per HPI, including: General: Denies fever, chills, night sweats or unintended weight loss. Resp: Denies cough, wheezing, shortness of breath. Cardiac: Denies chest pain, palpitations, orthopnea, paroxysmal nocturnal dyspnea. GI: Denies abdominal pain, nausea, vomiting, diarrhea or constipation GU: Denies dysuria, frequency, hesitancy or incontinence MS: Denies muscle aches, joint pain or swelling Neuro: Denies headache, neurologic deficits (focal weakness, numbness, tingling), abnormal gait Psych: Denies anxiety, depression, SI/HI/AVH Skin: Denies new rashes or lesions ID: Denies sick contacts, exotic exposures, travel  Examination: Constitutional: Not in acute distress Respiratory: Clear to auscultation bilaterally Cardiovascular: Normal sinus rhythm, no rubs Abdomen: Nontender nondistended good bowel sounds Musculoskeletal: No edema noted Skin: No rashes seen Neurologic: CN 2-12 grossly intact.  And nonfocal Psychiatric: Normal judgment and insight. Alert and oriented x 3. Normal mood.  Left neck nontunneled HD catheter noted without any sign of infection Foley placed 6/8  Objective: Vitals:   01/01/21 1719 01/01/21 2058 01/02/21 0433 01/02/21 0453  BP: (!) 167/81 (!) 166/87 (!) 146/56   Pulse: 68 64 60   Resp: 16 14 15    Temp:  98.1 F (36.7 C) 98.9 F (37.2 C)   TempSrc:  Oral Oral   SpO2: 96% 97% 98%   Weight:    60.6 kg  Height:        Intake/Output Summary (Last 24 hours) at 01/02/2021 0755 Last data filed at 01/02/2021 0542 Gross per 24 hour  Intake 633.01 ml  Output 1050 ml  Net -416.99 ml   Filed Weights   01/01/21 1142 01/01/21 1444 01/02/21 0453  Weight: 61.2 kg  60.5 kg 60.6 kg     Data Reviewed:   CBC: Recent Labs  Lab 12/30/20 0106 12/31/20 0128 01/01/21 0349 01/02/21 0327  WBC 10.8* 14.2* 10.8* 11.0*  HGB 7.6* 7.6* 7.4* 8.4*  HCT 24.6* 23.8* 23.4* 26.8*  MCV 97.2 97.5 97.1 97.1  PLT 230 214 202 502   Basic Metabolic Panel: Recent Labs  Lab 12/29/20 0414 12/30/20 0106 12/31/20 0128 01/01/21 0349 01/02/21 0327  NA 138 138 136 139 138  K 3.8 3.7 3.8 3.7 4.2  CL 101 100 98 102 102  CO2 22 27 25 24 27   GLUCOSE 87 89 94 88 92  BUN 76* 33* 50* 64* 33*  CREATININE 6.27* 3.52* 4.53* 5.21* 3.30*  CALCIUM 7.7* 7.7* 8.1* 8.1* 8.3*  MG  --  1.6* 1.6* 1.8 1.7  PHOS 7.4* 4.2 5.9* 6.7* 4.3   GFR: Estimated Creatinine Clearance: 13.8 mL/min (A) (by C-G formula based on SCr of 3.3 mg/dL (H)). Liver Function Tests: Recent Labs  Lab 12/29/20 0414 12/30/20 0106 12/31/20 0128 01/01/21 0349 01/02/21 0327  ALBUMIN 2.5* 2.4* 2.5* 2.3* 2.5*   No results for input(s): LIPASE, AMYLASE in the last 168 hours. No results for input(s): AMMONIA in the last 168 hours. Coagulation Profile: No results for input(s): INR, PROTIME in the last 168  hours. Cardiac Enzymes: No results for input(s): CKTOTAL, CKMB, CKMBINDEX, TROPONINI in the last 168 hours. BNP (last 3 results) Recent Labs    01/09/20 1443  PROBNP 1,018*   HbA1C: No results for input(s): HGBA1C in the last 72 hours. CBG: No results for input(s): GLUCAP in the last 168 hours. Lipid Profile: No results for input(s): CHOL, HDL, LDLCALC, TRIG, CHOLHDL, LDLDIRECT in the last 72 hours. Thyroid Function Tests: Recent Labs    12/30/20 0838  TSH 4.178   Anemia Panel: No results for input(s): VITAMINB12, FOLATE, FERRITIN, TIBC, IRON, RETICCTPCT in the last 72 hours. Sepsis Labs: No results for input(s): PROCALCITON, LATICACIDVEN in the last 168 hours.  Recent Results (from the past 240 hour(s))  MRSA Next Gen by PCR, Nasal     Status: None   Collection Time: 01/01/21 10:17 PM   Result Value Ref Range Status   MRSA by PCR Next Gen NOT DETECTED NOT DETECTED Final    Comment: (NOTE) The GeneXpert MRSA Assay (FDA approved for NASAL specimens only), is one component of a comprehensive MRSA colonization surveillance program. It is not intended to diagnose MRSA infection nor to guide or monitor treatment for MRSA infections. Test performance is not FDA approved in patients less than 41 years old. Performed at [facility], [place] [address] [place], [place] [address]          Radiology Studies: No results found.      Scheduled Meds:  (feeding supplement) PROSource Plus  30 mL Oral TID BM   sodium chloride   Intravenous Once   aspirin  81 mg Oral QPC supper   atorvastatin  20 mg Oral Daily   Chlorhexidine Gluconate Cloth  6 each Topical Daily   feeding supplement  1 Container Oral TID BM   ferrous sulfate  325 mg Oral Q breakfast   heparin injection (subcutaneous)  5,000 Units Subcutaneous Q8H   midodrine  10 mg Oral TID WC   senna-docusate  1 tablet Oral BID   Continuous Infusions:  sodium chloride 10 mL/hr at 01/01/21 1026     LOS: 23 days   Time spent= 35 mins    Ki Corbo Arsenio Loader, MD [facility]  If 7PM-7AM, please contact night-coverage  01/02/2021, 7:55 AM

## 2021-01-02 NOTE — Progress Notes (Deleted)
Spoke with primary RN. Explained IR placed the line,so IR will be the appropriate ones to remove the line.

## 2021-01-02 NOTE — Progress Notes (Signed)
Patient ID: Andrew [name], male   DOB: 01/20/1932, 87 y.o.   MRN: [id number] S:No new complaints.  Tolerated HD well yesterday. O:BP (!) 146/56 (BP Location: Left Arm)   Pulse 60   Temp 98.9 F (37.2 C) (Oral)   Resp 15   Ht 5\' 8"  (1.727 m)   Wt 60.6 kg   SpO2 98%   BMI 20.31 kg/m   Intake/Output Summary (Last 24 hours) at 01/02/2021 1030 Last data filed at 01/02/2021 0542 Gross per 24 hour  Intake 633.01 ml  Output 1050 ml  Net -416.99 ml   Intake/Output: I/O last 3 completed shifts: In: 1680.5 [P.O.:360; I.V.:1320.5] Out: 9480 [Urine:1210; Other:500]  Intake/Output this shift:  No intake/output data recorded. Weight change:  Gen: NAD CVS: RRR Resp: cta Abd: benign Ext: no edema  Recent Labs  Lab 12/27/20 0439 12/28/20 0403 12/29/20 0414 12/30/20 0106 12/31/20 0128 01/01/21 0349 01/02/21 0327  NA 136 136 138 138 136 139 138  K 3.1* 4.2 3.8 3.7 3.8 3.7 4.2  CL 97* 102 101 100 98 102 102  CO2 26 19* 22 27 25 24 27   GLUCOSE 93 82 87 89 94 88 92  BUN 57* 69* 76* 33* 50* 64* 33*  CREATININE 5.25* 5.85* 6.27* 3.52* 4.53* 5.21* 3.30*  ALBUMIN 2.4* 2.5* 2.5* 2.4* 2.5* 2.3* 2.5*  CALCIUM 7.9* 7.8* 7.7* 7.7* 8.1* 8.1* 8.3*  PHOS 7.0* 7.0* 7.4* 4.2 5.9* 6.7* 4.3   Liver Function Tests: Recent Labs  Lab 12/31/20 0128 01/01/21 0349 01/02/21 0327  ALBUMIN 2.5* 2.3* 2.5*   No results for input(s): LIPASE, AMYLASE in the last 168 hours. No results for input(s): AMMONIA in the last 168 hours. CBC: Recent Labs  Lab 12/30/20 0106 12/31/20 0128 01/01/21 0349 01/02/21 0327  WBC 10.8* 14.2* 10.8* 11.0*  HGB 7.6* 7.6* 7.4* 8.4*  HCT 24.6* 23.8* 23.4* 26.8*  MCV 97.2 97.5 97.1 97.1  PLT 230 214 202 201   Cardiac Enzymes: No results for input(s): CKTOTAL, CKMB, CKMBINDEX, TROPONINI in the last 168 hours. CBG: No results for input(s): GLUCAP in the last 168 hours.  Iron Studies: No results for input(s): IRON, TIBC, TRANSFERRIN, FERRITIN in the last 72  hours. Studies/Results: No results found.  (feeding supplement) PROSource Plus  30 mL Oral TID BM   sodium chloride   Intravenous Once   aspirin  81 mg Oral QPC supper   atorvastatin  20 mg Oral Daily   Chlorhexidine Gluconate Cloth  6 each Topical Daily   feeding supplement  1 Container Oral TID BM   ferrous sulfate  325 mg Oral Q breakfast   heparin injection (subcutaneous)  5,000 Units Subcutaneous Q8H   midodrine  10 mg Oral TID WC   senna-docusate  1 tablet Oral BID    BMET    Component Value Date/Time   NA 138 01/02/2021 0327   NA 139 03/18/2020 1456   K 4.2 01/02/2021 0327   CL 102 01/02/2021 0327   CO2 27 01/02/2021 0327   GLUCOSE 92 01/02/2021 0327   BUN 33 (H) 01/02/2021 0327   BUN 44 (H) 03/18/2020 1456   CREATININE 3.30 (H) 01/02/2021 0327   CREATININE 1.32 (H) 08/19/2019 1358   CALCIUM 8.3 (L) 01/02/2021 0327   GFRNONAA 17 (L) 01/02/2021 0327   GFRAA 71 03/18/2020 1456   CBC    Component Value Date/Time   WBC 11.0 (H) 01/02/2021 0327   RBC 2.76 (L) 01/02/2021 0327   HGB 8.4 (L) 01/02/2021 0327  HCT 26.8 (L) 01/02/2021 0327   PLT 201 01/02/2021 0327   MCV 97.1 01/02/2021 0327   MCH 30.4 01/02/2021 0327   MCHC 31.3 01/02/2021 0327   RDW 16.5 (H) 01/02/2021 0327   LYMPHSABS 0.2 (L) 12/10/2020 1713   MONOABS 0.8 12/10/2020 1713   EOSABS 0.5 12/10/2020 1713   BASOSABS 0.1 12/10/2020 1713    Assessment/Plan:   AKI, non-oliguric - Presumably due to ischemic ATN in setting of hypotension and volume depletion.  Started on HD 12/20/20 and last HD on 12/24/20 with increased UOP up to 900 cc's for the last 2 days. He is awake and alert without uremic symptoms, however his BUN/Cr continue to rise.     Non-tunneled HD catheter placed by IR on 12/20/20 Had HD 12/29/20 for clearance only (rising BUN). Remains non-oliguric and will follow BUN/Cr and decide on more HD over the next 24 hours. IVF's stopped after he developed some volume overload.  IF BUN/Cr continue to  climb will then need [facility] placement on Monday or Tuesday. Will temp HD catheter today since it has been in for 12 days. Anemia - s/p ESA, transfuse with HD today. Urinary retention - acute on chronic s/p foley cath placed 12/29/20 Acute hypoxic respiratory failure - associated with CAP.   CAP - resolved with abx Hypotension - currently on midodrine Disposition - pt with significant debility and needs PT/OT.  Will need to see if his insurance will pay for outpatient dialysis for AKI if his renal function does not recover.  Donetta Potts, MD [employer] [phone]

## 2021-01-02 NOTE — Plan of Care (Signed)

## 2021-01-03 DIAGNOSIS — N179 Acute kidney failure, unspecified: Secondary | ICD-10-CM | POA: Diagnosis not present

## 2021-01-03 LAB — CBC
HCT: 27.6 % — ABNORMAL LOW (ref 39.0–52.0)
Hemoglobin: 8.7 g/dL — ABNORMAL LOW (ref 13.0–17.0)
MCH: 31.1 pg (ref 26.0–34.0)
MCHC: 31.5 g/dL (ref 30.0–36.0)
MCV: 98.6 fL (ref 80.0–100.0)
Platelets: 198 10*3/uL (ref 150–400)
RBC: 2.8 MIL/uL — ABNORMAL LOW (ref 4.22–5.81)
RDW: 15.8 % — ABNORMAL HIGH (ref 11.5–15.5)
WBC: 10.9 10*3/uL — ABNORMAL HIGH (ref 4.0–10.5)
nRBC: 0 % (ref 0.0–0.2)

## 2021-01-03 LAB — RENAL FUNCTION PANEL
Albumin: 2.6 g/dL — ABNORMAL LOW (ref 3.5–5.0)
Anion gap: 11 (ref 5–15)
BUN: 55 mg/dL — ABNORMAL HIGH (ref 8–23)
CO2: 26 mmol/L (ref 22–32)
Calcium: 8.5 mg/dL — ABNORMAL LOW (ref 8.9–10.3)
Chloride: 103 mmol/L (ref 98–111)
Creatinine, Ser: 4.39 mg/dL — ABNORMAL HIGH (ref 0.61–1.24)
GFR, Estimated: 12 mL/min — ABNORMAL LOW (ref >60)
Glucose, Bld: 83 mg/dL (ref 70–99)
Phosphorus: 6.2 mg/dL — ABNORMAL HIGH (ref 2.5–4.6)
Potassium: 4 mmol/L (ref 3.5–5.1)
Sodium: 140 mmol/L (ref 135–145)

## 2021-01-03 LAB — MAGNESIUM: Magnesium: 1.9 mg/dL (ref 1.7–2.4)

## 2021-01-03 MED ORDER — MIDODRINE HCL 5 MG PO TABS
5.0000 mg | ORAL_TABLET | Freq: Three times a day (TID) | ORAL | Status: DC
  Administered 2021-01-03: 5 mg via ORAL
  Filled 2021-01-03: qty 1

## 2021-01-03 NOTE — Plan of Care (Signed)
Patient remains free from falls. Safety precautions maintained. 

## 2021-01-03 NOTE — Progress Notes (Signed)
  AKI, non-oliguric - Presumably due to ischemic ATN in setting of hypotension and volume depletion.  Started on HD 12/20/20 and last HD on 12/24/20 with increased UOP up to 900 cc's for the last 2 days. He is awake and alert without uremic symptoms, however his BUN/Cr continue to rise.     Non-tunneled HD catheter placed by IR on 12/20/20 Had HD 12/29/20 and 6/11. Follow- will make NPO past MN in case he needs TDC Anemia - s/p ESA, transfused with HD Urinary retention - acute on chronic s/p foley cath placed 12/29/20 Acute hypoxic respiratory failure - associated with CAP.   CAP - resolved with abx Hypotension - currently on midodrine Disposition - pt with significant debility and needs PT/OT.  Has AKI spot at [facility] if needed.  Subjective:    Seen in room, wife at bedside.  Nontunneled HD catheter removed yesterday.  Appears that he is still having a significant rate of rise of Cr.     Objective:   BP (!) 148/76 (BP Location: Left Arm)   Pulse 68   Temp 98.4 F (36.9 C) (Oral)   Resp 20   Ht 5\' 8"  (1.727 m)   Wt 60.5 kg   SpO2 99%   BMI 20.28 kg/m   Physical Exam: Gen: NAD, sitting in bed, HOH CVS: RRR Resp: clear Abd: soft Ext: no LE edema ACCESS: none at present  Labs: BMET Recent Labs  Lab 12/28/20 0403 12/29/20 0414 12/30/20 0106 12/31/20 0128 01/01/21 0349 01/02/21 0327 01/03/21 0434  NA 136 138 138 136 139 138 140  K 4.2 3.8 3.7 3.8 3.7 4.2 4.0  CL 102 101 100 98 102 102 103  CO2 19* 22 27 25 24 27 26   GLUCOSE 82 87 89 94 88 92 83  BUN 69* 76* 33* 50* 64* 33* 55*  CREATININE 5.85* 6.27* 3.52* 4.53* 5.21* 3.30* 4.39*  CALCIUM 7.8* 7.7* 7.7* 8.1* 8.1* 8.3* 8.5*  PHOS 7.0* 7.4* 4.2 5.9* 6.7* 4.3 6.2*   CBC Recent Labs  Lab 12/31/20 0128 01/01/21 0349 01/02/21 0327 01/03/21 0434  WBC 14.2* 10.8* 11.0* 10.9*  HGB 7.6* 7.4* 8.4* 8.7*  HCT 23.8* 23.4* 26.8* 27.6*  MCV 97.5 97.1 97.1 98.6  PLT 214 202 201 198      Medications:     (feeding supplement)  PROSource Plus  30 mL Oral TID BM   aspirin  81 mg Oral QPC supper   atorvastatin  20 mg Oral Daily   Chlorhexidine Gluconate Cloth  6 each Topical Daily   feeding supplement  1 Container Oral TID BM   ferrous sulfate  325 mg Oral Q breakfast   heparin injection (subcutaneous)  5,000 Units Subcutaneous Q8H   midodrine  5 mg Oral TID WC   senna-docusate  1 tablet Oral BID     Madelon Lips MD 01/03/2021, 12:00 PM

## 2021-01-03 NOTE — Progress Notes (Signed)
PROGRESS NOTE    Andrew [name]  YYT:[id number] DOB: 04/04/1932 DOA: 12/10/2020 PCP: [name], MD   Brief Narrative:  87 y.o. male with a history of thoracic aortic aneurysm status post stent graft repair, AAA, CABG in 2010, chronic HFpEF, BPH with urinary retention, CKD II, and remote atrial fibrillation not anticoagulated who presented to the ED 5/20 prompted by PCP for abnormal blood work which was drawn in the setting of cough that hadn't improved with augmentin. He also had been losing appetite and feeling unwell.   Labs were notable for mild leukocytosis, worsening anemia, renal failure. CT the chest, abdomen, and pelvis is notable for new left upper lobe infiltrate, unchanged stent graft repair of thoracic aortic aneurysm, and grossly stable infrarenal AAA.  Patient was treated with rocephin, azithromycin, albuterol, and IV fluids in the ED. SBP nadir was in 70's shortly after admission. Creatinine was 8.71 worsening to 9.32 from previous of 1.09 in Aug 2021. Nephrology was consulted. Further goals of care discussions have continued. The patient and family now confirm their desire for trial of hemodialysis if there continues to be no renal recovery.  Nontunneled HD catheter was placed 5/30.  Nephrology providing intermittent dialysis.  Plans to convert nontunneled HD to tunneled HD anticipation for outpatient dialysis.  Assessment & Plan:   Principal Problem:   AKI (acute kidney injury) ([place]) Active Problems:   Thoracic aortic aneurysm without rupture ([place])   Coronary artery disease   Urinary retention   AAA (abdominal aortic aneurysm) without rupture ([facility])   Essential hypertension   Normocytic anemia   Chronic diastolic CHF (congestive heart failure) ([place])   Community acquired pneumonia   Acute kidney injury, nonoliguric.  Suspect ATN  -Although adequate urine output, renal function and uremia worsens without dialysis.  Creatinine 4.39. - Nontunneled HD placed 5/30.   Plans per nephro to convert to tunneled HD in anticipation for outpatient dialysis - Nephro following.  Dialysis per nephro discretion.   -Monitor electrolytes  Leukocytosis -No evidence of infection  Acute on chronic urinary retention - Foley catheter removed 6/11.  Straight in and out cath every 8 hours-he does it at home.  Acute hypoxic respiratory failure: Due to CAP, possible aspiration, possible renal failure-related volume overload.  -Resolved.  Recently completed antibiotics.  For now continue as needed bronchodilators.  I-S/flutter.  Out of bed to chair.  Supplemental O2.  Aspiration precautions.  Left upper lobe community-acquired pneumonia - Completed 5 days of Rocephin and azithromycin   Hypotension, intermittent - Reduce Midodrine to 5mg  tid due to slightly elevated BP - Random cortisol and TSH-normal   CAD: History of PCI and CABG 2010. No anginal complaints currently.  Intermittent asymptomatic bradycardia/PAC --Continue aspirin and statin - Follows outpatient cardiology, Dr. Debara Pickett   Chronic HFpEF: Echo 5/26 unchanged from prior with LVEF 60-65%, unevaluated diastolic function, no RWMAs. Normal RV, no severe valvular disease.  -HD per nephrology   Thoracic aortic aneurysm s/p repair:  -Stable   AAA: Appears stable on CT in ED  - VVS appointment with repeat U/S on 6/21 with Dr. Carlis Abbott.    Iron deficiency anemia: No acute bleeding noted.  -Iron supplements with bowel regimen. Hb 7.4 - Status post 1 unit PRBC 6/11.  Hemoglobin 8.7  Needs to be out of bed to chair daily    DVT prophylaxis: Subcu heparin Code Status: Full Family Communication: Darcus Pester, no answer. No family at bedside today Status is: Inpatient  Remains inpatient appropriate because:Inpatient level  of care appropriate due to severity of illness   Dispo: The patient is from: Home              Anticipated d/c is to: SNF              Patient currently is not medically stable to d/c.   Dialysis plans per nephro.  Disposition to be determined depending on his long-term dialysis needs.   Difficult to place patient No   Nutritional status  Nutrition Problem: Inadequate oral intake Etiology: poor appetite  Signs/Symptoms: per patient/family report  Interventions: Boost Breeze, Prostat, Refer to RD note for recommendations  Body mass index is 20.28 kg/m.      Subjective: Doing ok, no new complaints. Tells me he was out of his bed yesterday  Review of Systems Otherwise negative except as per HPI, including: General = no fevers, chills, dizziness,  fatigue HEENT/EYES = negative for loss of vision, double vision, blurred vision,  sore throa Cardiovascular= negative for chest pain, palpitation Respiratory/lungs= negative for shortness of breath, cough, wheezing; hemoptysis,  Gastrointestinal= negative for nausea, vomiting, abdominal pain Genitourinary= negative for Dysuria MSK = Negative for arthralgia, myalgias Neurology= Negative for headache, numbness, tingling  Psychiatry= Negative for suicidal and homocidal ideation Skin= Negative for Rash   Examination: Constitutional: Not in acute distress, elderly frail cachectic Respiratory: Clear to auscultation bilaterally Cardiovascular: Normal sinus rhythm, no rubs Abdomen: Nontender nondistended good bowel sounds Musculoskeletal: No edema noted Skin: No rashes seen Neurologic: CN 2-12 grossly intact.  And nonfocal Psychiatric: Normal judgment and insight. Alert and oriented x 3. Normal mood.     Objective: Vitals:   01/02/21 1332 01/02/21 2025 01/03/21 0443 01/03/21 0500  BP: (!) 173/67 (!) 161/85 (!) 152/84   Pulse: 64 74 67   Resp: 16 17 18    Temp:  99 F (37.2 C) 98.8 F (37.1 C)   TempSrc:      SpO2: 99% 98% 97%   Weight:    60.5 kg  Height:        Intake/Output Summary (Last 24 hours) at 01/03/2021 0826 Last data filed at 01/02/2021 1219 Gross per 24 hour  Intake --  Output 500 ml  Net  -500 ml   Filed Weights   01/01/21 1444 01/02/21 0453 01/03/21 0500  Weight: 60.5 kg 60.6 kg 60.5 kg     Data Reviewed:   CBC: Recent Labs  Lab 12/30/20 0106 12/31/20 0128 01/01/21 0349 01/02/21 0327 01/03/21 0434  WBC 10.8* 14.2* 10.8* 11.0* 10.9*  HGB 7.6* 7.6* 7.4* 8.4* 8.7*  HCT 24.6* 23.8* 23.4* 26.8* 27.6*  MCV 97.2 97.5 97.1 97.1 98.6  PLT 230 214 202 201 174   Basic Metabolic Panel: Recent Labs  Lab 12/30/20 0106 12/31/20 0128 01/01/21 0349 01/02/21 0327 01/03/21 0434  NA 138 136 139 138 140  K 3.7 3.8 3.7 4.2 4.0  CL 100 98 102 102 103  CO2 27 25 24 27 26   GLUCOSE 89 94 88 92 83  BUN 33* 50* 64* 33* 55*  CREATININE 3.52* 4.53* 5.21* 3.30* 4.39*  CALCIUM 7.7* 8.1* 8.1* 8.3* 8.5*  MG 1.6* 1.6* 1.8 1.7 1.9  PHOS 4.2 5.9* 6.7* 4.3 6.2*   GFR: Estimated Creatinine Clearance: 10.3 mL/min (A) (by C-G formula based on SCr of 4.39 mg/dL (H)). Liver Function Tests: Recent Labs  Lab 12/30/20 0106 12/31/20 0128 01/01/21 0349 01/02/21 0327 01/03/21 0434  ALBUMIN 2.4* 2.5* 2.3* 2.5* 2.6*   No results for input(s): LIPASE, AMYLASE  in the last 168 hours. No results for input(s): AMMONIA in the last 168 hours. Coagulation Profile: No results for input(s): INR, PROTIME in the last 168 hours. Cardiac Enzymes: No results for input(s): CKTOTAL, CKMB, CKMBINDEX, TROPONINI in the last 168 hours. BNP (last 3 results) Recent Labs    01/09/20 1443  PROBNP 1,018*   HbA1C: No results for input(s): HGBA1C in the last 72 hours. CBG: No results for input(s): GLUCAP in the last 168 hours. Lipid Profile: No results for input(s): CHOL, HDL, LDLCALC, TRIG, CHOLHDL, LDLDIRECT in the last 72 hours. Thyroid Function Tests: No results for input(s): TSH, T4TOTAL, FREET4, T3FREE, THYROIDAB in the last 72 hours.  Anemia Panel: No results for input(s): VITAMINB12, FOLATE, FERRITIN, TIBC, IRON, RETICCTPCT in the last 72 hours. Sepsis Labs: No results for input(s):  PROCALCITON, LATICACIDVEN in the last 168 hours.  Recent Results (from the past 240 hour(s))  MRSA Next Gen by PCR, Nasal     Status: None   Collection Time: 01/01/21 10:17 PM  Result Value Ref Range Status   MRSA by PCR Next Gen NOT DETECTED NOT DETECTED Final    Comment: (NOTE) The GeneXpert MRSA Assay (FDA approved for NASAL specimens only), is one component of a comprehensive MRSA colonization surveillance program. It is not intended to diagnose MRSA infection nor to guide or monitor treatment for MRSA infections. Test performance is not FDA approved in patients less than 38 years old. Performed at [facility], [place] [address], [place], [place] [address]          Radiology Studies: No results found.      Scheduled Meds:  (feeding supplement) PROSource Plus  30 mL Oral TID BM   sodium chloride   Intravenous Once   aspirin  81 mg Oral QPC supper   atorvastatin  20 mg Oral Daily   Chlorhexidine Gluconate Cloth  6 each Topical Daily   feeding supplement  1 Container Oral TID BM   ferrous sulfate  325 mg Oral Q breakfast   heparin injection (subcutaneous)  5,000 Units Subcutaneous Q8H   midodrine  10 mg Oral TID WC   senna-docusate  1 tablet Oral BID   Continuous Infusions:  sodium chloride 10 mL/hr at 01/01/21 1026     LOS: 24 days   Time spent= 35 mins    Lequisha Cammack Arsenio Loader, MD [facility]  If 7PM-7AM, please contact night-coverage  01/03/2021, 8:26 AM

## 2021-01-03 NOTE — TOC Progression Note (Signed)
Transition of Care [facility]) - Progression Note    Patient Details  Name: Andrew [name] MRN: [id number] Date of Birth: 08/04/1931  [facility]) CM/SW Contact  Joanne Chars, LCSW Phone Number: 01/03/2021, 2:57 PM  Clinical Narrative:   CSW LM with Museum/gallery conservator at [facility], covering for [place].  Per MD, pt will need HD after discharge, informed Amber of this for transportation arrangments.    Expected Discharge Plan: Skilled Nursing Facility Barriers to Discharge: Ship broker, Continued Medical Work up  Expected Discharge Plan and Services Expected Discharge Plan: [place] Choice: [place] arrangements for the past 2 months: Single Family Home                                       Social Determinants of Health (SDOH) Interventions    Readmission Risk Interventions No flowsheet data found.

## 2021-01-03 NOTE — Progress Notes (Signed)
Physical Therapy Treatment Patient Details Name: Andrew [name] MRN: [id number] DOB: 07/15/1932 Today's Date: 01/03/2021    History of Present Illness Pt is an 87 y.o. male admitted 12/10/20 with abnormal blood work. Workup for ARF with uremia. Imaging notable for PNA. S/p temprorary HD cath placement 5/30; iHD initiated. Plan for tunneled HD cath placement 6/13 or 6/14. PMH includes CABG, TAA, AAA, CHF, CKD2, remote afib (not on anticoagulation), back sx.    PT Comments    Pt admitted with above diagnosis. Pt was able to complete 4 stands to [facility] standing up to 1 min with min guard to min assist of 2 for stability/safety today.  Pt needs max encouragement to participate. Pt did kiss wife while standing today and was smiling at end of session.  Pt currently with functional limitations due to balance and endurance deficits. Pt will benefit from skilled PT to increase their independence and safety with mobility to allow discharge to the venue listed below.      Follow Up Recommendations  SNF;Supervision for mobility/OOB     Equipment Recommendations  Wheelchair (measurements PT);Wheelchair cushion (measurements PT);Hospital bed    Recommendations for Other Services OT consult     Precautions / Restrictions Precautions Precautions: Fall;Other (comment) Precaution Comments: Significant anxiety/fearful of falling; frequent urge to have BM with mobility Restrictions Weight Bearing Restrictions: No    Mobility  Bed Mobility               General bed mobility comments: Nursing had just got pt up in chair on arrival.    Transfers Overall transfer level: Needs assistance Equipment used: Ambulation equipment used Transfers: Sit to/from Stand Sit to Stand: Min assist;Mod assist;+2 physical assistance Stand pivot transfers: +2 physical assistance;From elevated surface;Total assist       General transfer comment: Pt min guard to mod assist for trunk elevation from EOB into stedy  standing frame, pt assisting well with BUE support; pt quick to become anxious regarding standing; multiple additional sit<>stands from recliner to  stedy seat to recliner (x4 stands);  pt extending trunk back but difficulty achieving adequate hip extension.  First stand was 10 seconds with a rest.  then pt stood 1 min x 2 kissing wife while in standing position for motivation. Pt then stood last attempt x 20 seconds.  Overall ptdid better with practicing sit to stands while in [place].  Ambulation/Gait                 Stairs             Wheelchair Mobility    Modified Rankin (Stroke Patients Only)       Balance Overall balance assessment: Needs assistance Sitting-balance support: Bilateral upper extremity supported;Feet supported;No upper extremity supported Sitting balance-Leahy Scale: Fair Sitting balance - Comments: Maintains static sitting balance at EOB with supervision A for safety. Postural control: Posterior lean Standing balance support: Bilateral upper extremity supported;During functional activity Standing balance-Leahy Scale: Poor Standing balance comment: Reliant on UE support                            Cognition Arousal/Alertness: Awake/alert Behavior During Therapy: Anxious Overall Cognitive Status: No family/caregiver present to determine baseline cognitive functioning Area of Impairment: Attention;Memory;Following commands;Safety/judgement;Awareness;Problem solving                   Current Attention Level: Sustained Memory: Decreased short-term memory Following Commands: Follows one step commands with increased  time;Follows one step commands inconsistently Safety/Judgement: Decreased awareness of deficits;Decreased awareness of safety Awareness: Emergent Problem Solving: Slow processing;Decreased initiation;Requires verbal cues;Requires tactile cues;Difficulty sequencing        Exercises General Exercises - Lower  Extremity Long Arc Quad: AROM;Both;Seated;10 reps Hip Flexion/Marching: AROM;Both;Seated;10 reps    General Comments General comments (skin integrity, edema, etc.): VSS on 3LO2      Pertinent Vitals/Pain Pain Assessment: No/denies pain    Home Living                      Prior Function            PT Goals (current goals can now be found in the care plan section) Progress towards PT goals: Progressing toward goals    Frequency    Min 2X/week      PT Plan Current plan remains appropriate    Co-evaluation              AM-PAC PT "6 Clicks" Mobility   Outcome Measure  Help needed turning from your back to your side while in a flat bed without using bedrails?: A Lot Help needed moving from lying on your back to sitting on the side of a flat bed without using bedrails?: A Lot Help needed moving to and from a bed to a chair (including a wheelchair)?: A Lot Help needed standing up from a chair using your arms (e.g., wheelchair or bedside chair)?: A Lot Help needed to walk in hospital room?: Total Help needed climbing 3-5 steps with a railing? : Total 6 Click Score: 10    End of Session Equipment Utilized During Treatment: Gait belt;Oxygen Activity Tolerance: Patient limited by fatigue;Other (comment) (pt limited by anxiety/fearful of falling) Patient left: in chair;with call bell/phone within reach;with chair alarm set;with family/visitor present Nurse Communication: Mobility status;Need for lift equipment PT Visit Diagnosis: Muscle weakness (generalized) (M62.81);Difficulty in walking, not elsewhere classified (R26.2)     Time: 7544-9201 PT Time Calculation (min) (ACUTE ONLY): 24 min  Charges:  $Therapeutic Exercise: 8-22 mins $Therapeutic Activity: 8-22 mins                     Yalitza Teed M,PT Acute Rehab Services [phone] (pager)    Alvira Philips 01/03/2021, 3:54 PM

## 2021-01-04 ENCOUNTER — Inpatient Hospital Stay (HOSPITAL_COMMUNITY): Payer: Medicare Other

## 2021-01-04 DIAGNOSIS — N179 Acute kidney failure, unspecified: Secondary | ICD-10-CM | POA: Diagnosis not present

## 2021-01-04 HISTORY — PX: IR US GUIDE VASC ACCESS RIGHT: IMG2390

## 2021-01-04 HISTORY — PX: IR PERC TUN PERIT CATH WO PORT S&I /IMAG: IMG2327

## 2021-01-04 LAB — RENAL FUNCTION PANEL
Albumin: 2.5 g/dL — ABNORMAL LOW (ref 3.5–5.0)
Albumin: 2.9 g/dL — ABNORMAL LOW (ref 3.5–5.0)
Anion gap: 12 (ref 5–15)
Anion gap: 14 (ref 5–15)
BUN: 66 mg/dL — ABNORMAL HIGH (ref 8–23)
BUN: 67 mg/dL — ABNORMAL HIGH (ref 8–23)
CO2: 24 mmol/L (ref 22–32)
CO2: 22 mmol/L (ref 22–32)
Calcium: 8 mg/dL — ABNORMAL LOW (ref 8.9–10.3)
Calcium: 8.7 mg/dL — ABNORMAL LOW (ref 8.9–10.3)
Chloride: 102 mmol/L (ref 98–111)
Chloride: 104 mmol/L (ref 98–111)
Creatinine, Ser: 5.02 mg/dL — ABNORMAL HIGH (ref 0.61–1.24)
Creatinine, Ser: 5.17 mg/dL — ABNORMAL HIGH (ref 0.61–1.24)
GFR, Estimated: 11 mL/min — ABNORMAL LOW (ref >60)
GFR, Estimated: 10 mL/min — ABNORMAL LOW (ref >60)
Glucose, Bld: 97 mg/dL (ref 70–99)
Glucose, Bld: 92 mg/dL (ref 70–99)
Phosphorus: 6.9 mg/dL — ABNORMAL HIGH (ref 2.5–4.6)
Phosphorus: 6.8 mg/dL — ABNORMAL HIGH (ref 2.5–4.6)
Potassium: 3.6 mmol/L (ref 3.5–5.1)
Potassium: 3.9 mmol/L (ref 3.5–5.1)
Sodium: 138 mmol/L (ref 135–145)
Sodium: 140 mmol/L (ref 135–145)

## 2021-01-04 LAB — CBC
HCT: 26.5 % — ABNORMAL LOW (ref 39.0–52.0)
HCT: 29.5 % — ABNORMAL LOW (ref 39.0–52.0)
Hemoglobin: 8.3 g/dL — ABNORMAL LOW (ref 13.0–17.0)
Hemoglobin: 9 g/dL — ABNORMAL LOW (ref 13.0–17.0)
MCH: 30.3 pg (ref 26.0–34.0)
MCH: 29.9 pg (ref 26.0–34.0)
MCHC: 31.3 g/dL (ref 30.0–36.0)
MCHC: 30.5 g/dL (ref 30.0–36.0)
MCV: 96.7 fL (ref 80.0–100.0)
MCV: 98 fL (ref 80.0–100.0)
Platelets: 190 10*3/uL (ref 150–400)
Platelets: 216 10*3/uL (ref 150–400)
RBC: 2.74 MIL/uL — ABNORMAL LOW (ref 4.22–5.81)
RBC: 3.01 MIL/uL — ABNORMAL LOW (ref 4.22–5.81)
RDW: 15.6 % — ABNORMAL HIGH (ref 11.5–15.5)
RDW: 15.4 % (ref 11.5–15.5)
WBC: 13.1 10*3/uL — ABNORMAL HIGH (ref 4.0–10.5)
WBC: 11.8 10*3/uL — ABNORMAL HIGH (ref 4.0–10.5)
nRBC: 0 % (ref 0.0–0.2)
nRBC: 0 % (ref 0.0–0.2)

## 2021-01-04 MED ORDER — CEFAZOLIN SODIUM-DEXTROSE 2-4 GM/100ML-% IV SOLN
2.0000 g | INTRAVENOUS | Status: AC
  Administered 2021-01-04: 2 g via INTRAVENOUS
  Filled 2021-01-04: qty 100

## 2021-01-04 MED ORDER — GELATIN ABSORBABLE 12-7 MM EX MISC
CUTANEOUS | Status: AC
  Filled 2021-01-04: qty 1

## 2021-01-04 MED ORDER — HEPARIN SODIUM (PORCINE) 1000 UNIT/ML IJ SOLN
INTRAMUSCULAR | Status: AC
  Filled 2021-01-04: qty 1

## 2021-01-04 MED ORDER — FENTANYL CITRATE (PF) 100 MCG/2ML IJ SOLN
INTRAMUSCULAR | Status: AC
  Filled 2021-01-04: qty 4

## 2021-01-04 MED ORDER — MIDAZOLAM HCL 2 MG/2ML IJ SOLN
INTRAMUSCULAR | Status: AC
  Filled 2021-01-04: qty 4

## 2021-01-04 MED ORDER — MIDAZOLAM HCL 2 MG/2ML IJ SOLN
INTRAMUSCULAR | Status: AC | PRN
  Administered 2021-01-04: 0.5 mg via INTRAVENOUS

## 2021-01-04 MED ORDER — ALTEPLASE 2 MG IJ SOLR
2.0000 mg | Freq: Once | INTRAMUSCULAR | Status: DC | PRN
  Filled 2021-01-04: qty 2

## 2021-01-04 MED ORDER — LIDOCAINE-EPINEPHRINE 1 %-1:100000 IJ SOLN
INTRAMUSCULAR | Status: AC | PRN
  Administered 2021-01-04: 10 mL

## 2021-01-04 MED ORDER — SODIUM CHLORIDE 0.9 % IV SOLN: 100.0000 mL | INTRAVENOUS | Status: DC | PRN

## 2021-01-04 MED ORDER — LIDOCAINE-PRILOCAINE 2.5-2.5 % EX CREA
1.0000 "application " | TOPICAL_CREAM | CUTANEOUS | Status: DC | PRN
  Filled 2021-01-04: qty 5

## 2021-01-04 MED ORDER — CHLORHEXIDINE GLUCONATE CLOTH 2 % EX PADS
6.0000 | MEDICATED_PAD | Freq: Every day | CUTANEOUS | Status: DC
  Administered 2021-01-05 – 2021-01-10 (×6): 6 via TOPICAL

## 2021-01-04 MED ORDER — LIDOCAINE-EPINEPHRINE 1 %-1:100000 IJ SOLN
INTRAMUSCULAR | Status: AC
  Filled 2021-01-04: qty 1

## 2021-01-04 MED ORDER — MIDODRINE HCL 5 MG PO TABS
5.0000 mg | ORAL_TABLET | Freq: Three times a day (TID) | ORAL | Status: DC | PRN
  Administered 2021-01-08 – 2021-01-10 (×2): 5 mg via ORAL
  Filled 2021-01-04: qty 1

## 2021-01-04 MED ORDER — LIDOCAINE HCL (PF) 1 % IJ SOLN: 5.0000 mL | INTRAMUSCULAR | Status: DC | PRN

## 2021-01-04 MED ORDER — FENTANYL CITRATE (PF) 100 MCG/2ML IJ SOLN
INTRAMUSCULAR | Status: AC | PRN
  Administered 2021-01-04: 25 ug via INTRAVENOUS

## 2021-01-04 MED ORDER — SODIUM CHLORIDE 0.9 % IV SOLN
100.0000 mL | INTRAVENOUS | Status: DC | PRN
Start: 2021-01-04 — End: 2021-01-11

## 2021-01-04 MED ORDER — HEPARIN SODIUM (PORCINE) 1000 UNIT/ML DIALYSIS
1000.0000 [IU] | INTRAMUSCULAR | Status: DC | PRN
  Filled 2021-01-04: qty 1

## 2021-01-04 MED ORDER — PENTAFLUOROPROP-TETRAFLUOROETH EX AERO: 1.0000 "application " | INHALATION_SPRAY | CUTANEOUS | Status: DC | PRN

## 2021-01-04 NOTE — Progress Notes (Signed)
PROGRESS NOTE    Andrew [name]  DQQ:[id number] DOB: 1931-08-20 DOA: 12/10/2020 PCP: [name], MD   Brief Narrative:  87 y.o. male with a history of thoracic aortic aneurysm status post stent graft repair, AAA, CABG in 2010, chronic HFpEF, BPH with urinary retention, CKD II, and remote atrial fibrillation not anticoagulated who presented to the ED 5/20 prompted by PCP for abnormal blood work which was drawn in the setting of cough that hadn't improved with augmentin. He also had been losing appetite and feeling unwell.   Labs were notable for mild leukocytosis, worsening anemia, renal failure. CT the chest, abdomen, and pelvis is notable for new left upper lobe infiltrate, unchanged stent graft repair of thoracic aortic aneurysm, and grossly stable infrarenal AAA.  Patient was treated with rocephin, azithromycin, albuterol, and IV fluids in the ED. SBP nadir was in 70's shortly after admission. Creatinine was 8.71 worsening to 9.32 from previous of 1.09 in Aug 2021. Nephrology was consulted. Further goals of care discussions have continued. The patient and family now confirm their desire for trial of hemodialysis if there continues to be no renal recovery.  Nontunneled HD catheter was placed 5/30.  Nephrology providing intermittent dialysis.  Plans to convert nontunneled HD to tunneled HD anticipation for outpatient dialysis per nephrology.  Assessment & Plan:   Principal Problem:   AKI (acute kidney injury) ([place]) Active Problems:   Thoracic aortic aneurysm without rupture ([place])   Coronary artery disease   Urinary retention   AAA (abdominal aortic aneurysm) without rupture ([facility])   Essential hypertension   Normocytic anemia   Chronic diastolic CHF (congestive heart failure) ([place])   Community acquired pneumonia   Acute kidney injury, nonoliguric.  Suspect ATN  -Although adequate urine output, renal function and uremia worsens without dialysis.  Creatinine 5.0 to - Nontunneled HD  placed 5/30, removed 6/12.  Plans for tunneled catheter per nephrology. - Nephro following.  Dialysis per nephro discretion.   -Monitor electrolytes  Leukocytosis -No evidence of infection  Acute on chronic urinary retention - Foley catheter removed 6/11.  Straight in and out cath every 8 hours-he does it at home.  But continues to retain, difficult Foley placement.  May need coud catheter  Acute hypoxic respiratory failure: Due to CAP, possible aspiration, possible renal failure-related volume overload.  -Resolved.  Recently completed antibiotics.  For now continue as needed bronchodilators.  I-S/flutter.  Out of bed to chair.  Supplemental O2.  Aspiration precautions.  Left upper lobe community-acquired pneumonia - Completed 5 days of Rocephin and azithromycin   Hypotension, intermittent -Elevated blood pressure at her follow-up change midodrine 5 mg 3 times daily to as needed - Random cortisol and TSH-normal   CAD: History of PCI and CABG 2010. No anginal complaints currently.  Intermittent asymptomatic bradycardia/PAC --Continue aspirin and statin - Follows outpatient cardiology, Dr. Debara Pickett   Chronic HFpEF: Echo 5/26 unchanged from prior with LVEF 60-65%, unevaluated diastolic function, no RWMAs. Normal RV, no severe valvular disease.  -HD per nephrology   Thoracic aortic aneurysm s/p repair:  -Stable   AAA: Appears stable on CT in ED  - VVS appointment with repeat U/S on 6/21 with Dr. Carlis Abbott.    Iron deficiency anemia: No acute bleeding noted.  -Iron supplements with bowel regimen. Hb 7.4 - Status post 1 unit PRBC 6/11.  Hemoglobin 8.7  Needs to be out of bed to chair daily PT-SNF    DVT prophylaxis: Subcu heparin Code Status: Full Family Communication: Wife  at bedside Status is: In wife at bedsidepatient  Remains inpatient appropriate because:Inpatient level of care appropriate due to severity of illness   Dispo: The patient is from: Home               Anticipated d/c is to: SNF              Patient currently is not medically stable to d/c.  Dialysis plans per nephro.  Disposition to be determined depending on his long-term dialysis needs.   Difficult to place patient No   Nutritional status  Nutrition Problem: Inadequate oral intake Etiology: poor appetite  Signs/Symptoms: per patient/family report  Interventions: Boost Breeze, Prostat, Refer to RD note for recommendations  Body mass index is 21.02 kg/m.      Subjective: Had issues with urinary retention overnight with difficult catheter placement.  No other complaints  Examination: Constitutional: Not in acute distress, cachectic elderly frail Respiratory: Clear to auscultation bilaterally Cardiovascular: Normal sinus rhythm, no rubs Abdomen: Nontender nondistended good bowel sounds Musculoskeletal: No edema noted Skin: No rashes seen Neurologic: CN 2-12 grossly intact.  And nonfocal Psychiatric: Normal judgment and insight. Alert and oriented x 3. Normal mood. Objective: Vitals:   01/03/21 1514 01/03/21 2021 01/04/21 0501 01/04/21 0531  BP: (!) 169/72 (!) 163/75 (!) 169/86   Pulse:  (!) 43 (!) 52   Resp: 19 18 20    Temp: 98.3 F (36.8 C) 98.5 F (36.9 C) 98.2 F (36.8 C)   TempSrc: Oral Oral    SpO2: 100% 100% 100%   Weight:    62.7 kg  Height:        Intake/Output Summary (Last 24 hours) at 01/04/2021 0723 Last data filed at 01/03/2021 0916 Gross per 24 hour  Intake 240 ml  Output --  Net 240 ml   Filed Weights   01/02/21 0453 01/03/21 0500 01/04/21 0531  Weight: 60.6 kg 60.5 kg 62.7 kg     Data Reviewed:   CBC: Recent Labs  Lab 12/31/20 0128 01/01/21 0349 01/02/21 0327 01/03/21 0434 01/04/21 0044  WBC 14.2* 10.8* 11.0* 10.9* 13.1*  HGB 7.6* 7.4* 8.4* 8.7* 8.3*  HCT 23.8* 23.4* 26.8* 27.6* 26.5*  MCV 97.5 97.1 97.1 98.6 96.7  PLT 214 202 201 198 952   Basic Metabolic Panel: Recent Labs  Lab 12/30/20 0106 12/31/20 0128 01/01/21 0349  01/02/21 0327 01/03/21 0434 01/04/21 0044  NA 138 136 139 138 140 138  K 3.7 3.8 3.7 4.2 4.0 3.6  CL 100 98 102 102 103 102  CO2 27 25 24 27 26 24   GLUCOSE 89 94 88 92 83 97  BUN 33* 50* 64* 33* 55* 66*  CREATININE 3.52* 4.53* 5.21* 3.30* 4.39* 5.02*  CALCIUM 7.7* 8.1* 8.1* 8.3* 8.5* 8.0*  MG 1.6* 1.6* 1.8 1.7 1.9  --   PHOS 4.2 5.9* 6.7* 4.3 6.2* 6.9*   GFR: Estimated Creatinine Clearance: 9.4 mL/min (A) (by C-G formula based on SCr of 5.02 mg/dL (H)). Liver Function Tests: Recent Labs  Lab 12/31/20 0128 01/01/21 0349 01/02/21 0327 01/03/21 0434 01/04/21 0044  ALBUMIN 2.5* 2.3* 2.5* 2.6* 2.5*   No results for input(s): LIPASE, AMYLASE in the last 168 hours. No results for input(s): AMMONIA in the last 168 hours. Coagulation Profile: No results for input(s): INR, PROTIME in the last 168 hours. Cardiac Enzymes: No results for input(s): CKTOTAL, CKMB, CKMBINDEX, TROPONINI in the last 168 hours. BNP (last 3 results) Recent Labs    01/09/20 1443  PROBNP  1,018*   HbA1C: No results for input(s): HGBA1C in the last 72 hours. CBG: No results for input(s): GLUCAP in the last 168 hours. Lipid Profile: No results for input(s): CHOL, HDL, LDLCALC, TRIG, CHOLHDL, LDLDIRECT in the last 72 hours. Thyroid Function Tests: No results for input(s): TSH, T4TOTAL, FREET4, T3FREE, THYROIDAB in the last 72 hours.  Anemia Panel: No results for input(s): VITAMINB12, FOLATE, FERRITIN, TIBC, IRON, RETICCTPCT in the last 72 hours. Sepsis Labs: No results for input(s): PROCALCITON, LATICACIDVEN in the last 168 hours.  Recent Results (from the past 240 hour(s))  MRSA Next Gen by PCR, Nasal     Status: None   Collection Time: 01/01/21 10:17 PM  Result Value Ref Range Status   MRSA by PCR Next Gen NOT DETECTED NOT DETECTED Final    Comment: (NOTE) The GeneXpert MRSA Assay (FDA approved for NASAL specimens only), is one component of a comprehensive MRSA colonization surveillance program.  It is not intended to diagnose MRSA infection nor to guide or monitor treatment for MRSA infections. Test performance is not FDA approved in patients less than 21 years old. Performed at [facility], [place] [address] [place], [place] [address]          Radiology Studies: No results found.      Scheduled Meds:  (feeding supplement) PROSource Plus  30 mL Oral TID BM   aspirin  81 mg Oral QPC supper   atorvastatin  20 mg Oral Daily   Chlorhexidine Gluconate Cloth  6 each Topical Daily   feeding supplement  1 Container Oral TID BM   ferrous sulfate  325 mg Oral Q breakfast   heparin injection (subcutaneous)  5,000 Units Subcutaneous Q8H   midodrine  5 mg Oral TID WC   senna-docusate  1 tablet Oral BID   Continuous Infusions:  sodium chloride 10 mL/hr at 01/01/21 1026     LOS: 25 days   Time spent= 35 mins    Jayelyn Barno Arsenio Loader, MD [facility]  If 7PM-7AM, please contact night-coverage  01/04/2021, 7:23 AM

## 2021-01-04 NOTE — Progress Notes (Signed)
Occupational Therapy Treatment Patient Details Name: Andrew [name] MRN: [id number] DOB: 01/27/1932 Today's Date: 01/04/2021    History of present illness Pt is an 87 y.o. male admitted 12/10/20 with abnormal blood work. Workup for ARF with uremia. Imaging notable for PNA. S/p temprorary HD cath placement 5/30; iHD initiated. Plan for tunneled HD cath placement 6/13 or 6/14. PMH includes CABG, TAA, AAA, CHF, CKD2, remote afib (not on anticoagulation), back sx.   OT comments  Pt making steady progress towards OT goals this session. Pt continues to present with anxiety/ fear of falling with all mobility tasks but required less assist to stand to stedy this session needing only min guard assist +2 to stand from EOB. Pt continues to requires increased time and encouragement during all transitions. Pt currently requires MIN - MOD A for UB ADLS and total A for LB ADLs from bed level as pt declines toileting on BSC ( pt reports he feels too unsteady when sitting on BSC- offered to prop BSC against wall with pt still declining). Pt would continue to benefit from skilled occupational therapy while admitted and after d/c to address the below listed limitations in order to improve overall functional mobility and facilitate independence with BADL participation. DC plan remains appropriate, will follow acutely per POC.     Follow Up Recommendations  SNF;Supervision/Assistance - 24 hour    Equipment Recommendations  Other (comment) (defer to next level of care)    Recommendations for Other Services      Precautions / Restrictions Precautions Precautions: Fall;Other (comment) Precaution Comments: Significant anxiety/fearful of falling; frequent urge to have BM with mobility Restrictions Weight Bearing Restrictions: No       Mobility Bed Mobility Overal bed mobility: Needs Assistance Bed Mobility: Supine to Sit     Supine to sit: Min assist;+2 for physical assistance     General bed mobility  comments: pt able to maneuver BLEs to EOB, pt trying to elevate trunk independently but unable to do so despite step by step cues to sequence task. pt resistant to allowing OTA and tech to assist pt, but pt finally agreeable needing only MIN A +2 to elevate trunk and scoot hips fully to EOB    Transfers Overall transfer level: Needs assistance Equipment used: Ambulation equipment used Transfers: Sit to/from Stand Sit to Stand: Min guard;+2 safety/equipment         General transfer comment: pt able to rise to stedy x2 trials with min guard assist +2 mostly for safety    Balance Overall balance assessment: Needs assistance Sitting-balance support: Bilateral upper extremity supported;Feet supported;No upper extremity supported Sitting balance-Leahy Scale: Fair     Standing balance support: Bilateral upper extremity supported;During functional activity Standing balance-Leahy Scale: Poor Standing balance comment: Reliant on UE support                           ADL either performed or assessed with clinical judgement   ADL Overall ADL's : Needs assistance/impaired         Upper Body Bathing: Moderate assistance;Sitting Upper Body Bathing Details (indicate cue type and reason): simulated via applying lotion to back     Upper Body Dressing : Minimal assistance;Sitting Upper Body Dressing Details (indicate cue type and reason): to don posterior gown     Toilet Transfer: +2 for safety/equipment;Total assistance;Min guard Toilet Transfer Details (indicate cue type and reason): simulated via functional mobility in stedy; min guard to stand to stedy,  total  A for the pivot in stedy, pt declined getting on [facility] for toileting Toileting- Clothing Manipulation and Hygiene: Total assistance;Bed level Toileting - Clothing Manipulation Details (indicate cue type and reason): posterior pericare from bed level     Functional mobility during ADLs: Min guard;+2 for safety/equipment  (stedy) General ADL Comments: pt continues to present with anxiety with all mobility tasks but able to stand to stedy with min guard +2 for safety     Vision       Perception     Praxis      Cognition Arousal/Alertness: Awake/alert Behavior During Therapy: Anxious Overall Cognitive Status: Impaired/Different from baseline Area of Impairment: Attention;Memory;Following commands;Safety/judgement;Awareness                   Current Attention Level: Sustained Memory: Decreased short-term memory (repeated cues) Following Commands: Follows one step commands with increased time;Follows one step commands inconsistently Safety/Judgement: Decreased awareness of deficits;Decreased awareness of safety Awareness: Intellectual Problem Solving: Slow processing;Decreased initiation;Requires verbal cues;Requires tactile cues;Difficulty sequencing General Comments: Limited by significant anxiety/fear of falling, requries increased time to follow commands, wife present during session and providing support and encouragment        Exercises     Shoulder Instructions       General Comments vss on 3L [place]    Pertinent Vitals/ Pain       Pain Assessment: No/denies pain  Home Living                                          Prior Functioning/Environment              Frequency  Min 2X/week        Progress Toward Goals  OT Goals(current goals can now be found in the care plan section)  Progress towards OT goals: Progressing toward goals  Acute Rehab OT Goals Patient Stated Goal: to get procedure over with OT Goal Formulation: With patient Time For Goal Achievement: 01/12/21 (progressed goal date as OTR saw pt and updated goals on 6/8) Potential to Achieve Goals: Good  Plan Discharge plan remains appropriate;Frequency remains appropriate    Co-evaluation                 AM-PAC OT "6 Clicks" Daily Activity     Outcome Measure   Help from  another person eating meals?: A Little Help from another person taking care of personal grooming?: A Little Help from another person toileting, which includes using toliet, bedpan, or urinal?: A Lot Help from another person bathing (including washing, rinsing, drying)?: A Lot Help from another person to put on and taking off regular upper body clothing?: A Little Help from another person to put on and taking off regular lower body clothing?: A Lot 6 Click Score: 15    End of Session Equipment Utilized During Treatment: Gait belt;Other (comment);Oxygen (stedy; 3L [place])  OT Visit Diagnosis: Unsteadiness on feet (R26.81);Other abnormalities of gait and mobility (R26.89);Muscle weakness (generalized) (M62.81);History of falling (Z91.81)   Activity Tolerance Patient tolerated treatment well   Patient Left in chair;with call bell/phone within reach;with chair alarm set;with family/visitor present   Nurse Communication Mobility status;Other (comment) (stedy for back to bed)        Time: 0349-1791 OT Time Calculation (min): 25 min  Charges: OT General Charges $OT Visit: 1 Visit OT Treatments $Self Care/Home Management :  23-37 mins  Harley Alto., COTA/L Acute Rehabilitation Services [place] 01/04/2021, 11:14 AM

## 2021-01-04 NOTE — Progress Notes (Signed)
Paged Dr. Anselm Pancoast at this time, patient ok to resume diet from his standpoint. Patient was only NPO for dialysis cath placement procedure.

## 2021-01-04 NOTE — TOC Progression Note (Addendum)
Transition of Care [facility]) - Progression Note    Patient Details  Name: Andrew [name] MRN: [id number] Date of Birth: 25-Sep-1931  Transition of Care [facility] CM/SW Contact  Joanne Chars, LCSW Phone Number: 01/04/2021, 2:26 PM  Clinical Narrative:   CSW LM with [facility] regarding need for HD and possible DC by the end of the week.    1430:  TC Amber.  They should be good with admission.  They do need HD to be MWF schedule.  CSW spoke with Terri Piedra, renal navigator who reports MWF HD may not be available but she will see what she can do.    Expected Discharge Plan: Skilled Nursing Facility Barriers to Discharge: Ship broker, Continued Medical Work up  Expected Discharge Plan and Services Expected Discharge Plan: [place] Choice: [place] arrangements for the past 2 months: Single Family Home                                       Social Determinants of Health (SDOH) Interventions    Readmission Risk Interventions No flowsheet data found.

## 2021-01-04 NOTE — Progress Notes (Signed)
Pt was bladder scanned and 832 was shown to be in the bladder. Therefore, I straight cathed the pt per orders greater than 250 but I met resistance and no stream of urine formed. Pt has an active order for a coude to be placed but they didn't have any on hand in house tonight. So hopefully he will be able to get one tomorrow. Will continue to monitor.

## 2021-01-04 NOTE — Consult Note (Addendum)
Chief Complaint: Patient was seen in consultation today for tunneled dialysis catheter placement Chief Complaint  Patient presents with   Cough   at the request of Dr Laurena Bering   Supervising Physician: Markus Daft  Patient Status: [facility] - In-pt  History of Present Illness: Andrew [name] is a 87 y.o. male    Renal note today:    AKI, non-oliguric - Presumably due to ischemic ATN in setting of hypotension and volume depletion.  Started on HD 12/20/20 and last HD on 12/24/20 with increased UOP up to 900 cc's for the last 2 days. He is awake and alert without uremic symptoms, however his BUN/Cr continue to rise.     Non-tunneled HD catheter placed by IR on 12/20/20 Had HD 12/29/20 and 6/11. Follow- will make NPO past MN in case he needs TDC Anemia - s/p ESA, transfused with HD Urinary retention - acute on chronic s/p foley cath placed 12/29/20 Acute hypoxic respiratory failure - associated with CAP.   CAP - resolved with abx Hypotension - currently on midodrine Disposition - pt with significant debility and needs PT/OT.  Has AKI spot at [facility] if needed.  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 bph, pt self catheterizes himself on schedule   Complication of anesthesia    had to have Foley post op cabg and lumb lam   Coronary artery disease    s/p remote PCI // s/p CABG in 2010 Echo 01/2019: EF 60-65, Gr 1 DD, normal RVSF, RVSP 36, mild LAE, mild to mod TR   Hypertension    PVC's (premature ventricular contractions)    Monitor 12/2018:  Sinus rhythm with frequent PVC's (11.5%) and periods of bigeminy.    Thoracic aortic aneurysm  ([place])    09/29/16 CT: 4.3 cm ascending, 4.5 cm descending. 1 yr f/u rec // s/p endovascular repair 09/2019 (Dr. Carlis Abbott)   Urinary retention    Wears glasses     Past Surgical History:  Procedure Laterality Date   BACK SURGERY  2012   lumb lam   CARDIAC CATHETERIZATION     2010-per patient   COLONOSCOPY     CORONARY ARTERY BYPASS GRAFT  2010   triple   EPIGASTRIC HERNIA REPAIR N/A 09/11/2017   Procedure: HERNIA REPAIR EPIGASTRIC ADULT;  Surgeon: Rolm Bookbinder, MD;  Location: [place];  Service: General;  Laterality: N/A;   HERNIA REPAIR     [facility] [place]  09/11/2017   with mesh   INGUINAL HERNIA REPAIR  07/30/2012   Procedure: HERNIA REPAIR INGUINAL ADULT;  Surgeon: Rolm Bookbinder, MD;  Location: [place];  Service: General;  Laterality: Left;  Left Hernia Site   INSERTION OF DIALYSIS CATHETER N/A 12/24/2020   Procedure: INSERTION OF DIALYSIS CATHETER;  Surgeon: Kipp Brood, MD;  Location: [facility];  Service: Pulmonary;  Laterality: N/A;   INSERTION OF MESH  07/30/2012   Procedure: INSERTION OF MESH;  Surgeon: Rolm Bookbinder, MD;  Location: [place];  Service: General;  Laterality: Left;  Left Inguinal Hernia  INSERTION OF MESH N/A 09/11/2017   Procedure: INSERTION OF MESH;  Surgeon: Rolm Bookbinder, MD;  Location: [place];  Service: General;  Laterality: N/A;   IR FLUORO GUIDE CV LINE LEFT  12/20/2020   IR US GUIDE [place] LEFT  12/20/2020   LAPAROSCOPY N/A 09/11/2017   Procedure: DIAGNOSTIC LAPAOSCOPY;  Surgeon: Rolm Bookbinder, MD;  Location: [place];  Service: General;  Laterality: N/A;  ERAS pathway   SPINE SURGERY     lumbar laminectomy   THORACIC AORTIC ENDOVASCULAR STENT GRAFT N/A 09/22/2019   Procedure: THORACIC AORTIC ENDOVASCULAR STENT GRAFT;  Surgeon: Marty Heck, MD;  Location: [facility] OR;  Service: Vascular;  Laterality: N/A;   TONSILLECTOMY      Allergies: Quinolones and Naltrexone  Medications: Prior to  Admission medications   Medication Sig Start Date End Date Taking? Authorizing Provider  aspirin 81 MG tablet Take 81 mg by mouth daily after supper.    Yes [provider]  atorvastatin (LIPITOR) 20 MG tablet TAKE 1 TABLET ONCE DAILY AT 6 PM. Patient taking differently: Take 20 mg by mouth every evening. 09/20/20  Yes Hilty, Nadean Corwin, MD  Cholecalciferol (VITAMIN D3) 1000 units CAPS Take 1,000 Units by mouth daily.   Yes [provider]  Co-Enzyme Q-10 100 MG CAPS Take 100 mg by mouth daily.    Yes [provider]  CRANBERRY EXTRACT PO Take 1 tablet by mouth daily.   Yes [provider]  diltiazem (DILACOR XR) 120 MG 24 hr capsule TAKE 1 CAPSULE DAILY. Patient taking differently: Take 120 mg by mouth every evening. 09/20/20  Yes Hilty, Nadean Corwin, MD  ferrous sulfate 325 (65 FE) MG tablet Take 325 mg by mouth daily with breakfast.   Yes [provider]  furosemide (LASIX) 80 MG tablet Take 1 tablet (80 mg total) by mouth daily. 01/22/20  Yes Almyra Deforest, PA  gabapentin (NEURONTIN) 300 MG capsule Take 300 mg by mouth at bedtime.  04/18/12  Yes [provider]  ibuprofen (ADVIL) 200 MG tablet Take 200-400 mg by mouth every 6 (six) hours as needed (for pain).   Yes [provider]  loratadine (CLARITIN) 10 MG tablet Take 10 mg by mouth daily as needed for allergies or rhinitis.   Yes [provider]  Multiple Vitamins-Minerals (PRESERVISION AREDS 2 PO) Take 1 capsule by mouth 2 (two) times daily.   Yes [provider]  oxymetazoline (AFRIN) 0.05 % nasal spray Place 1 spray into both nostrils at bedtime as needed for congestion.   Yes [provider]  potassium chloride 20 MEQ TBCR Take 20 mEq by mouth daily. 01/22/20  Yes Almyra Deforest, PA  sodium chloride (MURO 128) 5 % ophthalmic ointment Place 1 application into both eyes at bedtime.   Yes [provider]  triamcinolone (NASACORT ALLERGY 24HR) 55 MCG/ACT AERO  nasal inhaler Place 2 sprays into the nose daily as needed (for allergies).    Yes [provider]  TURMERIC PO Take 1 capsule by mouth daily after breakfast.   Yes [provider]  vitamin B-12 (CYANOCOBALAMIN) 1000 MCG tablet Take 1,000 mcg by mouth daily.    Yes [provider]  vitamin C (ASCORBIC ACID) 250 MG tablet Take 250 mg by mouth daily.    Yes [provider]  isosorbide mononitrate (IMDUR) 30 MG 24 hr tablet Take 0.5 tablets (15 mg total) by mouth daily. Patient not taking: Reported on 12/11/2020 09/27/19 01/22/20  Darliss Cheney, MD  nitroGLYCERIN (NITRODUR - DOSED IN MG/24 HR) 0.2 mg/hr patch Use 1/2 patch daily to the affected area Patient not taking: Reported on 12/11/2020 10/11/18   Dene Gentry, MD  nitroGLYCERIN (NITROSTAT) 0.6 MG SL tablet Place 0.6 mg under the tongue every 5 (five) minutes as needed for chest pain. Patient not taking: Reported on 12/11/2020    [provider]     Family History  Problem Relation Age of Onset   Allergic rhinitis Son    Coronary artery disease Mother    Angioedema Neg Hx    Asthma Neg Hx    Atopy Neg Hx    Eczema Neg Hx    Immunodeficiency Neg Hx    Urticaria Neg Hx     Social History   Socioeconomic History   Marital status: Married    Spouse name: [name]   Number of children: Not on file   Years of education: Not on file   Highest education level: Not on file  Occupational History    Comment: retired  Tobacco Use   Smoking status: Former    Packs/day: 1.00    Years: 8.00    Pack years: 8.00    Types: Cigarettes    Quit date: 07/24/1984    Years since quitting: 36.4   Smokeless tobacco: Never  Vaping Use   Vaping Use: Never used  Substance and Sexual Activity   Alcohol use: No   Drug use: No   Sexual activity: Not on file  Other Topics Concern   Not on file  Social History Narrative   12/08/19 lives with wife   Social Determinants of Health   Financial Resource Strain:  Not on file  Food Insecurity: Not on file  Transportation Needs: Not on file  Physical Activity: Not on file  Stress: Not on file  Social Connections: Not on file    Review of Systems: A 12 point ROS discussed and pertinent positives are indicated in the HPI above.  All other systems are negative.    Vital Signs: BP (!) 169/86 (BP Location: Left Arm)   Pulse (!) 52   Temp 98.2 F (36.8 C)   Resp 20   Ht $R'5\' 8"'HZ$  (1.727 m)   Wt 138 lb 3.7 oz (62.7 kg)   SpO2 100%   BMI 21.02 kg/m   Physical Exam Vitals reviewed.  Constitutional:      Comments: Frail male  HENT:     Mouth/Throat:     Mouth: Mucous membranes are moist.  Cardiovascular:     Rate and Rhythm: Normal rate.  Pulmonary:     Effort: Pulmonary effort is normal.     Breath sounds: Wheezing present.  Abdominal:     Palpations: Abdomen is soft.  Musculoskeletal:        General: Normal range of motion.  Skin:    General: Skin is warm.  Neurological:     Mental Status: He is alert and oriented to person, place, and time.  Psychiatric:        Behavior: Behavior normal.    Imaging: CT ABDOMEN PELVIS WO CONTRAST  Result Date: 12/10/2020 CLINICAL DATA:  Hematuria, cough.  Shortness of breath. EXAM: CT CHEST, ABDOMEN AND PELVIS WITHOUT CONTRAST TECHNIQUE: Multidetector CT imaging of the chest, abdomen and pelvis was performed following the standard protocol without IV contrast. COMPARISON:  July 01, 2020. FINDINGS: CT CHEST FINDINGS Cardiovascular: Status post stent graft repair of transverse aortic arch and descending thoracic aorta. Status post coronary bypass graft.  Normal cardiac size. No pericardial effusion. Stable aneurysmal dilatation of distal descending thoracic aorta measuring 5.5 cm. Mediastinum/Nodes: No enlarged mediastinal, hilar, or axillary lymph nodes. Thyroid gland, trachea, and esophagus demonstrate no significant findings. Lungs/Pleura: No pneumothorax is noted. Stable probable scarring is noted in  the right lower lobe. New airspace opacity is noted in left upper lobe concerning for pneumonia. Musculoskeletal: No chest wall mass or suspicious bone lesions identified. CT ABDOMEN PELVIS FINDINGS Hepatobiliary: No focal liver abnormality is seen. No gallstones, gallbladder wall thickening, or biliary dilatation. Pancreas: Unremarkable. No pancreatic ductal dilatation or surrounding inflammatory changes. Spleen: Normal in size without focal abnormality. Adrenals/Urinary Tract: Adrenal glands are unremarkable. Kidneys are normal, without renal calculi, focal lesion, or hydronephrosis. Bladder is unremarkable. Stomach/Bowel: The stomach appears normal. There is no evidence of bowel obstruction or inflammation. Vascular/Lymphatic: Aneurysmal dilatation of abdominal aorta is noted, with maximum measured diameter 5.1 cm which is not significantly changed compared to prior exam. No adenopathy is noted. Reproductive: Prostate is unremarkable. Other: No abdominal wall hernia or abnormality. No abdominopelvic ascites. Musculoskeletal: No acute or significant osseous findings. IMPRESSION: New left upper lobe airspace opacity is noted concerning for possible pneumonia. Status post stent graft repair of descending thoracic aortic aneurysm, which has a maximum measured diameter 5.5 cm distally which is unchanged compared to prior exam. Grossly stable 5.1 cm infrarenal abdominal aortic aneurysm is noted. Recommend follow-up CT/MR every 6 months and vascular consultation. This recommendation follows ACR consensus guidelines: White Paper of the ACR Incidental Findings Committee II on Vascular Findings. J Am Coll Radiol 2013; 10:789-794. Aortic Atherosclerosis (ICD10-I70.0). Electronically Signed   By: Marijo Conception M.D.   On: 12/10/2020 19:34   CT Chest Wo Contrast  Result Date: 12/10/2020 CLINICAL DATA:  Hematuria, cough.  Shortness of breath. EXAM: CT CHEST, ABDOMEN AND PELVIS WITHOUT CONTRAST TECHNIQUE: Multidetector CT  imaging of the chest, abdomen and pelvis was performed following the standard protocol without IV contrast. COMPARISON:  July 01, 2020. FINDINGS: CT CHEST FINDINGS Cardiovascular: Status post stent graft repair of transverse aortic arch and descending thoracic aorta. Status post coronary bypass graft. Normal cardiac size. No pericardial effusion. Stable aneurysmal dilatation of distal descending thoracic aorta measuring 5.5 cm. Mediastinum/Nodes: No enlarged mediastinal, hilar, or axillary lymph nodes. Thyroid gland, trachea, and esophagus demonstrate no significant findings. Lungs/Pleura: No pneumothorax is noted. Stable probable scarring is noted in the right lower lobe. New airspace opacity is noted in left upper lobe concerning for pneumonia. Musculoskeletal: No chest wall mass or suspicious bone lesions identified. CT ABDOMEN PELVIS FINDINGS Hepatobiliary: No focal liver abnormality is seen. No gallstones, gallbladder wall thickening, or biliary dilatation. Pancreas: Unremarkable. No pancreatic ductal dilatation or surrounding inflammatory changes. Spleen: Normal in size without focal abnormality. Adrenals/Urinary Tract: Adrenal glands are unremarkable. Kidneys are normal, without renal calculi, focal lesion, or hydronephrosis. Bladder is unremarkable. Stomach/Bowel: The stomach appears normal. There is no evidence of bowel obstruction or inflammation. Vascular/Lymphatic: Aneurysmal dilatation of abdominal aorta is noted, with maximum measured diameter 5.1 cm which is not significantly changed compared to prior exam. No adenopathy is noted. Reproductive: Prostate is unremarkable. Other: No abdominal wall hernia or abnormality. No abdominopelvic ascites. Musculoskeletal: No acute or significant osseous findings. IMPRESSION: New left upper lobe airspace opacity is noted concerning for possible pneumonia. Status post stent graft repair of descending thoracic aortic aneurysm, which has a maximum measured  diameter 5.5 cm distally which is unchanged compared to prior exam. Grossly stable 5.1 cm infrarenal abdominal  aortic aneurysm is noted. Recommend follow-up CT/MR every 6 months and vascular consultation. This recommendation follows ACR consensus guidelines: White Paper of the ACR Incidental Findings Committee II on Vascular Findings. J Am Coll Radiol 2013; 10:789-794. Aortic Atherosclerosis (ICD10-I70.0). Electronically Signed   By: Marijo Conception M.D.   On: 12/10/2020 19:34   IR Fluoro Guide CV Line Left  Result Date: 12/20/2020 INDICATION: 87 year old male with history of worsening azotemia. EXAM: NON-TUNNELED CENTRAL VENOUS HEMODIALYSIS CATHETER PLACEMENT WITH ULTRASOUND AND FLUOROSCOPIC GUIDANCE COMPARISON:  None. MEDICATIONS: None FLUOROSCOPY TIME:  minutes,   seconds (  mGy) COMPLICATIONS: None immediate. PROCEDURE: Informed written consent was obtained from the patient after a discussion of the risks, benefits, and alternatives to treatment. Questions regarding the procedure were encouraged and answered. Preprocedure ultrasound evaluation demonstrated occlusion and possible variant anatomy of the right internal jugular vein, without safe access point for central line insertion. Therefore, the left neck and chest were prepped with chlorhexidine in a sterile fashion, and a sterile drape was applied covering the operative field. Maximum barrier sterile technique with sterile gowns and gloves were used for the procedure. A timeout was performed prior to the initiation of the procedure. After the overlying soft tissues were anesthetized, a small venotomy incision was created and a micropuncture kit was utilized to access the internal jugular vein. Real-time ultrasound guidance was utilized for vascular access including the acquisition of a permanent ultrasound image documenting patency of the accessed vessel. The microwire was utilized to measure appropriate catheter length. A guidewire was advanced to the  level of the IVC. Under fluoroscopic guidance, the venotomy was serially dilated, ultimately allowing placement of a 24 cm temporary Trialysis catheter with tip ultimately terminating within the superior aspect of the right atrium. Final catheter positioning was confirmed and documented with a spot radiographic image. The catheter aspirates and flushes normally. The catheter was flushed with appropriate volume heparin dwells. The catheter exit site was secured with a 0 silk retention suture. A dressing was placed. The patient tolerated the procedure well without immediate post procedural complication. IMPRESSION: Successful placement of a left internal jugular approach 24 cm temporary dialysis catheter with tip terminating with in the superior aspect of the right atrium. The catheter is ready for immediate use. PLAN: This catheter may be converted to a tunneled dialysis catheter at a later date as indicated. [name], MD [facility] Electronically Signed   By: [name] MD   On: 12/20/2020 11:11   IR US Guide Vasc Access Left  Result Date: 12/20/2020 INDICATION: 87 year old male with history of worsening azotemia. EXAM: NON-TUNNELED CENTRAL VENOUS HEMODIALYSIS CATHETER PLACEMENT WITH ULTRASOUND AND FLUOROSCOPIC GUIDANCE COMPARISON:  None. MEDICATIONS: None FLUOROSCOPY TIME:  minutes,   seconds (  mGy) COMPLICATIONS: None immediate. PROCEDURE: Informed written consent was obtained from the patient after a discussion of the risks, benefits, and alternatives to treatment. Questions regarding the procedure were encouraged and answered. Preprocedure ultrasound evaluation demonstrated occlusion and possible variant anatomy of the right internal jugular vein, without safe access point for central line insertion. Therefore, the left neck and chest were prepped with chlorhexidine in a sterile fashion, and a sterile drape was applied covering the operative  field. Maximum barrier sterile technique with sterile gowns and gloves were used for the procedure. A timeout was performed prior to the initiation of the procedure. After the overlying soft tissues were anesthetized, a small venotomy incision was created and a micropuncture kit was utilized  to access the internal jugular vein. Real-time ultrasound guidance was utilized for vascular access including the acquisition of a permanent ultrasound image documenting patency of the accessed vessel. The microwire was utilized to measure appropriate catheter length. A guidewire was advanced to the level of the IVC. Under fluoroscopic guidance, the venotomy was serially dilated, ultimately allowing placement of a 24 cm temporary Trialysis catheter with tip ultimately terminating within the superior aspect of the right atrium. Final catheter positioning was confirmed and documented with a spot radiographic image. The catheter aspirates and flushes normally. The catheter was flushed with appropriate volume heparin dwells. The catheter exit site was secured with a 0 silk retention suture. A dressing was placed. The patient tolerated the procedure well without immediate post procedural complication. IMPRESSION: Successful placement of a left internal jugular approach 24 cm temporary dialysis catheter with tip terminating with in the superior aspect of the right atrium. The catheter is ready for immediate use. PLAN: This catheter may be converted to a tunneled dialysis catheter at a later date as indicated. [name], MD [facility] Radiology Electronically Signed   By: [name] MD   On: 12/20/2020 11:11   DG CHEST PORT 1 VIEW  Result Date: 12/24/2020 CLINICAL DATA:  Central line placement. EXAM: PORTABLE CHEST 1 VIEW COMPARISON:  Dec 17, 2020. FINDINGS: Stable cardiomegaly. Status post coronary bypass graft. Stable position stent graft involving descending thoracic aorta.  Interval placement of left internal jugular dialysis catheter with distal tip in expected position of cavoatrial junction. No pneumothorax is noted. Mild bibasilar atelectasis or infiltrates are noted. Bony thorax is unremarkable. IMPRESSION: Interval placement of left internal jugular dialysis catheter with distal tip in expected position of cavoatrial junction. Mild bibasilar atelectasis or infiltrates are noted. Electronically Signed   By: Marijo Conception M.D.   On: 12/24/2020 15:19   DG CHEST PORT 1 VIEW  Result Date: 12/17/2020 CLINICAL DATA:  History of aortic stent graft repair with persistent cough and hypoxia EXAM: PORTABLE CHEST 1 VIEW COMPARISON:  12/10/2020 FINDINGS: Cardiac shadow is enlarged but stable. Postsurgical changes are noted. Thoracic aortic stent graft is again seen in the descending thoracic aorta stable from the prior exam. Lungs are well aerated bilaterally. Mild atelectatic changes are noted in the right base as well as in the left mid lung. No sizable effusion is noted. No bony abnormality is seen. IMPRESSION: Mild bilateral atelectatic changes. Electronically Signed   By: Inez Catalina M.D.   On: 12/17/2020 15:12   DG Chest Port 1 View  Result Date: 12/10/2020 CLINICAL DATA:  Cough and shortness of breath for 2 weeks EXAM: PORTABLE CHEST 1 VIEW COMPARISON:  07/01/2020 FINDINGS: Cardiac shadow is enlarged but stable. Diffuse aortic stent graft is noted in the descending thoracic aorta. Postsurgical changes are again noted and stable. Chronic pleural thickening laterally in the right lung base is noted. No focal infiltrate or sizable effusion is seen. No bony abnormality is seen. Stable calcified pleural plaques are noted. IMPRESSION: Stable appearance of the chest as described. Electronically Signed   By: Inez Catalina M.D.   On: 12/10/2020 16:58   ECHOCARDIOGRAM COMPLETE  Result Date: 12/16/2020    ECHOCARDIOGRAM REPORT   Patient Name:   KENZIE FLAKES Date of Exam: 12/16/2020  Medical Rec #:  [id number]       Height:       68.0 in Accession #:    [id number]      Weight:  153.7 lb Date of Birth:  04/21/1932       BSA:          1.827 m Patient Age:    21 years        BP:           137/83 mmHg Patient Gender: M               HR:           68 bpm. Exam Location:  Inpatient Procedure: 2D Echo Indications:    congestive heart failure  History:        Patient has prior history of Echocardiogram examinations, most                 recent 01/15/2020. CAD, Abnormal ECG, aortic aneurysm.                 pneumnonia.; Risk Factors:Hypertension.  Sonographer:    Johny Chess Referring Phys: [id number] Patrecia Pour  Sonographer Comments: Image acquisition challenging due to respiratory motion and Image acquisition challenging due to uncooperative patient. IMPRESSIONS  1. Left ventricular ejection fraction, by estimation, is 60 to 65%. The left ventricle has normal function. The left ventricle has no regional wall motion abnormalities. Left ventricular diastolic function could not be evaluated.  2. Right ventricular systolic function is normal. The right ventricular size is normal. There is normal pulmonary artery systolic pressure. The estimated right ventricular systolic pressure is 41.2 mmHg.  3. Left atrial size was moderately dilated.  4. The mitral valve is degenerative. Trivial mitral valve regurgitation. Mild mitral stenosis. The mean mitral valve gradient is 3.3 mmHg. Moderate mitral annular calcification.  5. The aortic valve is calcified. There is moderate calcification of the aortic valve. There is moderate thickening of the aortic valve. Aortic valve regurgitation is not visualized. Mild to moderate aortic valve sclerosis/calcification is present, without any evidence of aortic stenosis. Comparison(s): No significant change from prior study. FINDINGS  Left Ventricle: Left ventricular ejection fraction, by estimation, is 60 to 65%. The left ventricle has normal function. The left ventricle  has no regional wall motion abnormalities. The left ventricular internal cavity size was normal in size. There is  no left ventricular hypertrophy. Left ventricular diastolic function could not be evaluated due to mitral annular calcification (moderate or greater). Left ventricular diastolic function could not be evaluated. Right Ventricle: The right ventricular size is normal. No increase in right ventricular wall thickness. Right ventricular systolic function is normal. There is normal pulmonary artery systolic pressure. The tricuspid regurgitant velocity is 2.77 m/s, and  with an assumed right atrial pressure of 3 mmHg, the estimated right ventricular systolic pressure is 87.8 mmHg. Left Atrium: Left atrial size was moderately dilated. Right Atrium: Right atrial size was normal in size. Pericardium: There is no evidence of pericardial effusion. Mitral Valve: The mitral valve is degenerative in appearance. Moderate mitral annular calcification. Trivial mitral valve regurgitation. Mild mitral valve stenosis. The mean mitral valve gradient is 3.3 mmHg. Tricuspid Valve: The tricuspid valve is grossly normal. Tricuspid valve regurgitation is mild . No evidence of tricuspid stenosis. Aortic Valve: The aortic valve is calcified. There is moderate calcification of the aortic valve. There is moderate thickening of the aortic valve. Aortic valve regurgitation is not visualized. Mild to moderate aortic valve sclerosis/calcification is present, without any evidence of aortic stenosis. Aortic valve mean gradient measures 4.4 mmHg. Aortic valve peak gradient measures 10.3 mmHg. Aortic valve area, by VTI measures 1.76 cm.  Pulmonic Valve: The pulmonic valve was grossly normal. Pulmonic valve regurgitation is not visualized. No evidence of pulmonic stenosis. Aorta: The aortic root and ascending aorta are structurally normal, with no evidence of dilitation. Venous: The inferior vena cava was not well visualized. IAS/Shunts: The  atrial septum is grossly normal.  LEFT VENTRICLE PLAX 2D LVIDd:         4.60 cm LVIDs:         4.00 cm LV PW:         0.90 cm LV IVS:        1.00 cm LVOT diam:     1.80 cm LV SV:         55 LV SV Index:   30 LVOT Area:     2.54 cm  LEFT ATRIUM             Index       RIGHT ATRIUM           Index LA diam:        3.70 cm 2.02 cm/m  RA Area:     16.50 cm LA Vol (A2C):   49.6 ml 27.15 ml/m RA Volume:   35.50 ml  19.43 ml/m LA Vol (A4C):   85.0 ml 46.52 ml/m LA Biplane Vol: 70.2 ml 38.42 ml/m  AORTIC VALVE AV Area (Vmax):    1.54 cm AV Area (Vmean):   1.68 cm AV Area (VTI):     1.76 cm AV Vmax:           160.17 cm/s AV Vmean:          98.149 cm/s AV VTI:            0.311 m AV Peak Grad:      10.3 mmHg AV Mean Grad:      4.4 mmHg LVOT Vmax:         96.96 cm/s LVOT Vmean:        64.748 cm/s LVOT VTI:          0.215 m LVOT/AV VTI ratio: 0.69  AORTA Ao Root diam: 3.60 cm Ao Asc diam:  2.90 cm MITRAL VALVE           TRICUSPID VALVE MV Area VTI:  1.69 cm TR Peak grad:   30.7 mmHg MV Mean grad: 3.3 mmHg TR Vmax:        277.00 cm/s MV VTI:       0.32 m                        SHUNTS                        Systemic VTI:  0.22 m                        Systemic Diam: 1.80 cm Eleonore Chiquito MD Electronically signed by Eleonore Chiquito MD Signature Date/Time: 12/16/2020/4:25:14 PM    Final     Labs:  CBC: Recent Labs    01/01/21 0349 01/02/21 0327 01/03/21 0434 01/04/21 0044  WBC 10.8* 11.0* 10.9* 13.1*  HGB 7.4* 8.4* 8.7* 8.3*  HCT 23.4* 26.8* 27.6* 26.5*  PLT 202 201 198 190    COAGS: No results for input(s): INR, APTT in the last 8760 hours.  BMP: Recent Labs    01/09/20 1443 01/16/20 1131 01/29/20 1301 03/18/20 1456 12/10/20 1713 01/01/21 0349 01/02/21 0327 01/03/21 0434 01/04/21 0044  NA 142 140 142 139   < > 139 138 140 138  K 4.8 4.7 4.3 4.7   < > 3.7 4.2 4.0 3.6  CL 100 96 97 97   < > 102 102 103 102  CO2 $Re'27 28 29 28   'FEY$ < > $R'24 27 26 24  'hZ$ GLUCOSE 91 80 114* 97   < > 88 92 83 97  BUN 32*  31* 31* 44*   < > 64* 33* 55* 66*  CALCIUM 9.5 9.3 9.3 9.4   < > 8.1* 8.3* 8.5* 8.0*  CREATININE 1.37* 1.35* 1.24 1.09   < > 5.21* 3.30* 4.39* 5.02*  GFRNONAA 47* 48* 53* 61   < > 10* 17* 12* 11*  GFRAA 54* 55* 61 71  --   --   --   --   --    < > = values in this interval not displayed.    LIVER FUNCTION TESTS: Recent Labs    12/10/20 1713 12/11/20 1209 01/01/21 0349 01/02/21 0327 01/03/21 0434 01/04/21 0044  BILITOT 0.3  --   --   --   --   --   AST 12*  --   --   --   --   --   ALT 9  --   --   --   --   --   ALKPHOS 66  --   --   --   --   --   PROT 7.3  --   --   --   --   --   ALBUMIN 3.5   < > 2.3* 2.5* 2.6* 2.5*   < > = values in this interval not displayed.    TUMOR MARKERS: No results for input(s): AFPTM, CEA, CA199, CHROMGRNA in the last 8760 hours.  Assessment and Plan:  AKI; nonoliguric presumably due to ischemic ATN in setting of hypotension and volume depletion.   Temp cath placed in IR 5/30-- continued rise in Creatinine Nephrology requesting tunneled dialysis catheter placement for long term use Risks and benefits discussed with the patient including, but not limited to bleeding, infection, vascular injury, pneumothorax which may require chest tube placement, air embolism or even death  All of the patient's questions were answered, patient is agreeable to proceed. Consent signed and in chart.   Thank you for this interesting consult.  I greatly enjoyed meeting Andrew [name] and look forward to participating in their care.  A copy of this report was sent to the requesting provider on this date.  Electronically Signed: Lavonia Drafts, PA-C 01/04/2021, 10:16 AM   I spent a total of 20 Minutes   in face to face in clinical consultation, greater than 50% of which was counseling/coordinating care for tunneled dialysis catheter placement

## 2021-01-04 NOTE — Progress Notes (Signed)
  AKI, non-oliguric - Presumably due to ischemic ATN in setting of hypotension and volume depletion.  Started on HD 12/20/20.  Had HD 12/29/20 and 6/11. - temp cath removed 01/02/21 - TDC to be placed with IR, appreciatee assistance - will do HD after TDC placed, orders written Anemia - s/p ESA, transfused with HD Urinary retention - acute on chronic s/p foley cath placed 12/29/20 Acute hypoxic respiratory failure - associated with CAP.   CAP - resolved with abx Hypotension - currently on midodrine Disposition - pt with significant debility and needs PT/OT.  Has AKI spot at [facility] if needed.  Subjective:    Cr continues to rise.  UOP OK but clearance not good.  Discussed with pt and wife at bedside- will proceed with HD today and [facility] placement.    Objective:   BP (!) 169/86 (BP Location: Left Arm)   Pulse (!) 52   Temp 98.2 F (36.8 C)   Resp 20   Ht 5\' 8"  (1.727 m)   Wt 62.7 kg   SpO2 100%   BMI 21.02 kg/m   Physical Exam: Gen: NAD, sitting in bed, HOH CVS: RRR Resp: clear Abd: soft Ext: no LE edema ACCESS: none at present  Labs: BMET Recent Labs  Lab 12/29/20 0414 12/30/20 0106 12/31/20 0128 01/01/21 0349 01/02/21 0327 01/03/21 0434 01/04/21 0044  NA 138 138 136 139 138 140 138  K 3.8 3.7 3.8 3.7 4.2 4.0 3.6  CL 101 100 98 102 102 103 102  CO2 22 27 25 24 27 26 24   GLUCOSE 87 89 94 88 92 83 97  BUN 76* 33* 50* 64* 33* 55* 66*  CREATININE 6.27* 3.52* 4.53* 5.21* 3.30* 4.39* 5.02*  CALCIUM 7.7* 7.7* 8.1* 8.1* 8.3* 8.5* 8.0*  PHOS 7.4* 4.2 5.9* 6.7* 4.3 6.2* 6.9*   CBC Recent Labs  Lab 01/01/21 0349 01/02/21 0327 01/03/21 0434 01/04/21 0044  WBC 10.8* 11.0* 10.9* 13.1*  HGB 7.4* 8.4* 8.7* 8.3*  HCT 23.4* 26.8* 27.6* 26.5*  MCV 97.1 97.1 98.6 96.7  PLT 202 201 198 190      Medications:     (feeding supplement) PROSource Plus  30 mL Oral TID BM   aspirin  81 mg Oral QPC supper   atorvastatin  20 mg Oral Daily   Chlorhexidine Gluconate Cloth  6 each  Topical Daily   feeding supplement  1 Container Oral TID BM   ferrous sulfate  325 mg Oral Q breakfast   heparin injection (subcutaneous)  5,000 Units Subcutaneous Q8H   senna-docusate  1 tablet Oral BID     Madelon Lips MD 01/04/2021, 10:31 AM

## 2021-01-04 NOTE — Procedures (Signed)
Interventional Radiology Procedure:   Indications: AKI and needs tunneled dialysis catheter  Procedure: Placement of tunneled dialysis catheter  Findings: Right jugular Palindrome, 23 cm tip to cuff, tip at SVC/RA junction.   Complications: None     EBL: less than 10 ml  Plan: Dialysis catheter is ready to use.     Amiree No R. Anselm Pancoast, MD  Pager: [phone]

## 2021-01-05 DIAGNOSIS — I5032 Chronic diastolic (congestive) heart failure: Secondary | ICD-10-CM | POA: Diagnosis not present

## 2021-01-05 DIAGNOSIS — N179 Acute kidney failure, unspecified: Secondary | ICD-10-CM | POA: Diagnosis not present

## 2021-01-05 DIAGNOSIS — J9601 Acute respiratory failure with hypoxia: Secondary | ICD-10-CM | POA: Diagnosis not present

## 2021-01-05 LAB — RENAL FUNCTION PANEL
Albumin: 2.6 g/dL — ABNORMAL LOW (ref 3.5–5.0)
Anion gap: 12 (ref 5–15)
BUN: 25 mg/dL — ABNORMAL HIGH (ref 8–23)
CO2: 29 mmol/L (ref 22–32)
Calcium: 8.2 mg/dL — ABNORMAL LOW (ref 8.9–10.3)
Chloride: 96 mmol/L — ABNORMAL LOW (ref 98–111)
Creatinine, Ser: 2.57 mg/dL — ABNORMAL HIGH (ref 0.61–1.24)
GFR, Estimated: 24 mL/min — ABNORMAL LOW (ref >60)
Glucose, Bld: 93 mg/dL (ref 70–99)
Phosphorus: 2.9 mg/dL (ref 2.5–4.6)
Potassium: 3.5 mmol/L (ref 3.5–5.1)
Sodium: 137 mmol/L (ref 135–145)

## 2021-01-05 LAB — CBC
HCT: 25.6 % — ABNORMAL LOW (ref 39.0–52.0)
Hemoglobin: 8.3 g/dL — ABNORMAL LOW (ref 13.0–17.0)
MCH: 31 pg (ref 26.0–34.0)
MCHC: 32.4 g/dL (ref 30.0–36.0)
MCV: 95.5 fL (ref 80.0–100.0)
Platelets: 171 10*3/uL (ref 150–400)
RBC: 2.68 MIL/uL — ABNORMAL LOW (ref 4.22–5.81)
RDW: 15.3 % (ref 11.5–15.5)
WBC: 14.7 10*3/uL — ABNORMAL HIGH (ref 4.0–10.5)
nRBC: 0 % (ref 0.0–0.2)

## 2021-01-05 MED ORDER — HEPARIN SODIUM (PORCINE) 1000 UNIT/ML IJ SOLN
INTRAMUSCULAR | Status: AC
  Filled 2021-01-05: qty 5

## 2021-01-05 NOTE — Progress Notes (Signed)
PROGRESS NOTE    Andrew [name]  BMW:[id number] DOB: 09/08/1931 DOA: 12/10/2020 PCP: [name], MD    Brief Narrative:  87 y.o. male with a history of thoracic aortic aneurysm status post stent graft repair, AAA, CABG in 2010, chronic HFpEF, BPH with urinary retention, CKD II, and remote atrial fibrillation not anticoagulated who presented to the ED 5/20 prompted by PCP for abnormal blood work which was drawn in the setting of cough that hadn't improved with augmentin. He also had been losing appetite and feeling unwell.   Labs were notable for mild leukocytosis, worsening anemia, renal failure. CT the chest, abdomen, and pelvis is notable for new left upper lobe infiltrate, unchanged stent graft repair of thoracic aortic aneurysm, and grossly stable infrarenal AAA.  Patient was treated with rocephin, azithromycin, albuterol, and IV fluids in the ED. SBP nadir was in 70's shortly after admission. Creatinine was 8.71 worsening to 9.32 from previous of 1.09 in Aug 2021. Nephrology was consulted. Further goals of care discussions have continued. The patient and family now confirm their desire for trial of hemodialysis if there continues to be no renal recovery.  Nontunneled HD catheter was placed 5/30.  Nephrology providing intermittent dialysis.  Plans to convert nontunneled HD to tunneled HD anticipation for outpatient dialysis per nephrology.    Assessment & Plan:   Principal Problem:   AKI (acute kidney injury) ([place]) Active Problems:   Thoracic aortic aneurysm without rupture ([place])   Coronary artery disease   Urinary retention   AAA (abdominal aortic aneurysm) without rupture (H[facility])   Essential hypertension   Normocytic anemia   Chronic diastolic CHF (congestive heart failure) ([place])   Community acquired pneumonia  Acute kidney injury, nonoliguric.  Suspect ATN -Although adequate urine output, renal function and uremia worsens without dialysis.  Creatinine now 2.57 - Nontunneled  HD placed 5/30, removed 6/12.  Now s/p tunneled HD cath placed 6/14 - Nephro following.  Next HD 6/16 -Monitor electrolytes   Leukocytosis -stable -No evidence of acute infection   Acute on chronic urinary retention - Foley catheter removed 6/11.  Straight in and out cath every 8 hours-he does it at home.  But continues to retain, difficult Foley placement.  May need coud catheter   Acute hypoxic respiratory failure: Due to CAP, possible aspiration, possible renal failure-related volume overload. -Resolved.  Recently completed antibiotics.  For now continue as needed bronchodilators.  I-S/flutter.  cont with aspiration precautions   Left upper lobe community-acquired pneumonia - Completed 5 days of Rocephin and azithromycin   Hypotension, intermittent -Elevated blood pressure at her follow-up change midodrine 5 mg 3 times daily to as needed - Random cortisol and TSH-normal -BP stable now   CAD: History of PCI and CABG 2010. No anginal complaints currently.  Intermittent asymptomatic bradycardia/PAC --Continue aspirin and statin - Follows outpatient cardiology, Dr. Debara Pickett   Chronic HFpEF: Echo 5/26 unchanged from prior with LVEF 60-65%, unevaluated diastolic function, no RWMAs. Normal RV, no severe valvular disease. -HD per nephrology   Thoracic aortic aneurysm s/p repair: -Stable   AAA: Appears stable on CT in ED  - VVS appointment with repeat U/S on 6/21 with Dr. Carlis Abbott.   Iron deficiency anemia: No acute bleeding noted. -Iron supplements with bowel regimen. Hb 7.4 - Status post 1 unit PRBC 6/11.  Hemoglobin 8.7   Weakness -Plan SNF at time of d/c    DVT prophylaxis: Heparin subq Code Status: Full Family Communication: Pt in room, family at bedside  Status is: Inpatient  Remains inpatient appropriate because:Inpatient level of care appropriate due to severity of illness  Dispo: The patient is from: Home              Anticipated d/c is to: SNF              Patient  currently is not medically stable to d/c.   Difficult to place patient No    Consultants:  Nephrology IR Palliative Care  Procedures:    Antimicrobials: Anti-infectives (From admission, onward)    Start     Dose/Rate Route Frequency Ordered Stop   01/04/21 1130  ceFAZolin (ANCEF) IVPB 2g/100 mL premix        2 g 200 mL/hr over 30 Minutes Intravenous To Radiology 01/04/21 1031 01/04/21 1632   12/11/20 2000  cefTRIAXone (ROCEPHIN) 2 g in sodium chloride 0.9 % 100 mL IVPB        2 g 200 mL/hr over 30 Minutes Intravenous Every 24 hours 12/10/20 2308 12/14/20 2321   12/11/20 2000  azithromycin (ZITHROMAX) 500 mg in sodium chloride 0.9 % 250 mL IVPB        500 mg 250 mL/hr over 60 Minutes Intravenous Every 24 hours 12/10/20 2308 12/14/20 2133   12/10/20 1945  cefTRIAXone (ROCEPHIN) 1 g in sodium chloride 0.9 % 100 mL IVPB        1 g 200 mL/hr over 30 Minutes Intravenous  Once 12/10/20 1940 12/10/20 2037   12/10/20 1945  azithromycin (ZITHROMAX) 500 mg in sodium chloride 0.9 % 250 mL IVPB        500 mg 250 mL/hr over 60 Minutes Intravenous  Once 12/10/20 1940 12/10/20 2142       Subjective: Reports feeling better  Objective: Vitals:   01/05/21 0456 01/05/21 0500 01/05/21 0800 01/05/21 1200  BP: (!) 147/61     Pulse: 74     Resp: 17     Temp: 98.4 F (36.9 C)     TempSrc:      SpO2: 97%  98% 97%  Weight:  62.7 kg    Height:        Intake/Output Summary (Last 24 hours) at 01/05/2021 1330 Last data filed at 01/05/2021 0900 Gross per 24 hour  Intake 120 ml  Output 1028 ml  Net -908 ml   Filed Weights   01/03/21 0500 01/04/21 0531 01/05/21 0500  Weight: 60.5 kg 62.7 kg 62.7 kg    Examination: General exam: Awake, laying in bed, in nad Respiratory system: Normal respiratory effort, no wheezing Cardiovascular system: regular rate, s1, s2 Gastrointestinal system: Soft, nondistended, positive BS Central nervous system: CN2-12 grossly intact, strength  intact Extremities: Perfused, no clubbing Skin: Normal skin turgor, no notable skin lesions seen, tunneled HD cath in place Psychiatry: Mood normal // no visual hallucinations   Data Reviewed: I have personally reviewed following labs and imaging studies  CBC: Recent Labs  Lab 01/02/21 0327 01/03/21 0434 01/04/21 0044 01/04/21 1055 01/05/21 0436  WBC 11.0* 10.9* 13.1* 11.8* 14.7*  HGB 8.4* 8.7* 8.3* 9.0* 8.3*  HCT 26.8* 27.6* 26.5* 29.5* 25.6*  MCV 97.1 98.6 96.7 98.0 95.5  PLT 201 198 190 216 300   Basic Metabolic Panel: Recent Labs  Lab 12/30/20 0106 12/31/20 0128 01/01/21 0349 01/02/21 0327 01/03/21 0434 01/04/21 0044 01/04/21 1055 01/05/21 0436  NA 138 136 139 138 140 138 140 137  K 3.7 3.8 3.7 4.2 4.0 3.6 3.9 3.5  CL 100 98 102 102 103 102  104 96*  CO2 27 25 24 27 26 24 22 29   GLUCOSE 89 94 88 92 83 97 92 93  BUN 33* 50* 64* 33* 55* 66* 67* 25*  CREATININE 3.52* 4.53* 5.21* 3.30* 4.39* 5.02* 5.17* 2.57*  CALCIUM 7.7* 8.1* 8.1* 8.3* 8.5* 8.0* 8.7* 8.2*  MG 1.6* 1.6* 1.8 1.7 1.9  --   --   --   PHOS 4.2 5.9* 6.7* 4.3 6.2* 6.9* 6.8* 2.9   GFR: Estimated Creatinine Clearance: 18.3 mL/min (A) (by C-G formula based on SCr of 2.57 mg/dL (H)). Liver Function Tests: Recent Labs  Lab 01/02/21 0327 01/03/21 0434 01/04/21 0044 01/04/21 1055 01/05/21 0436  ALBUMIN 2.5* 2.6* 2.5* 2.9* 2.6*   No results for input(s): LIPASE, AMYLASE in the last 168 hours. No results for input(s): AMMONIA in the last 168 hours. Coagulation Profile: No results for input(s): INR, PROTIME in the last 168 hours. Cardiac Enzymes: No results for input(s): CKTOTAL, CKMB, CKMBINDEX, TROPONINI in the last 168 hours. BNP (last 3 results) Recent Labs    01/09/20 1443  PROBNP 1,018*   HbA1C: No results for input(s): HGBA1C in the last 72 hours. CBG: No results for input(s): GLUCAP in the last 168 hours. Lipid Profile: No results for input(s): CHOL, HDL, LDLCALC, TRIG, CHOLHDL,  LDLDIRECT in the last 72 hours. Thyroid Function Tests: No results for input(s): TSH, T4TOTAL, FREET4, T3FREE, THYROIDAB in the last 72 hours. Anemia Panel: No results for input(s): VITAMINB12, FOLATE, FERRITIN, TIBC, IRON, RETICCTPCT in the last 72 hours. Sepsis Labs: No results for input(s): PROCALCITON, LATICACIDVEN in the last 168 hours.  Recent Results (from the past 240 hour(s))  MRSA Next Gen by PCR, Nasal     Status: None   Collection Time: 01/01/21 10:17 PM  Result Value Ref Range Status   MRSA by PCR Next Gen NOT DETECTED NOT DETECTED Final    Comment: (NOTE) The GeneXpert MRSA Assay (FDA approved for NASAL specimens only), is one component of a comprehensive MRSA colonization surveillance program. It is not intended to diagnose MRSA infection nor to guide or monitor treatment for MRSA infections. Test performance is not FDA approved in patients less than 81 years old. Performed at [facility], [place] [address], [place], [place] [address]      Radiology Studies: IR US Guide Vasc Access Right  Result Date: 01/04/2021 INDICATION: 87 year old with renal failure and needs a tunneled dialysis catheter. EXAM: FLUOROSCOPIC AND ULTRASOUND GUIDED PLACEMENT OF A TUNNELED DIALYSIS CATHETER Physician: Stephan Minister. Anselm Pancoast, MD MEDICATIONS: Ancef 2 g; The antibiotic was administered within an appropriate time interval prior to skin puncture. ANESTHESIA/SEDATION: Versed 0.5 mg IV; Fentanyl 25 mcg IV; Moderate Sedation Time:  20 minutes The patient was continuously monitored during the procedure by the interventional radiology nurse under my direct supervision. FLUOROSCOPY TIME:  Fluoroscopy Time: 24 seconds, 1 mGy COMPLICATIONS: None immediate. PROCEDURE: Informed consent was obtained for placement of a tunneled dialysis catheter. The patient was placed supine on the interventional table. Ultrasound confirmed a patent right internal jugular vein. Ultrasound images were obtained for documentation.  The right neck and chest was prepped and draped in a sterile fashion. Maximal barrier sterile technique was utilized including caps, mask, sterile gowns, sterile gloves, sterile drape, hand hygiene and skin antiseptic. The right neck was anesthetized with 1% lidocaine. A small incision was made with #11 blade scalpel. A 21 gauge needle directed into the right internal jugular vein with ultrasound guidance. A micropuncture dilator set was placed.  A 23 cm tip to cuff Palindrome catheter was selected. The skin below the right clavicle was anesthetized and a small incision was made with an #11 blade scalpel. A subcutaneous tunnel was formed to the vein dermatotomy site. The catheter was brought through the tunnel. The vein dermatotomy site was dilated to accommodate a peel-away sheath. The catheter was placed through the peel-away sheath and directed into the central venous structures. The tip of the catheter was placed at the superior cavoatrial junction with fluoroscopy. Fluoroscopic images were obtained for documentation. Both lumens were found to aspirate and flush well. The proper amount of heparin was flushed in both lumens. The vein dermatotomy site was closed using a single layer of absorbable suture and Dermabond. The catheter was secured to the skin using Prolene suture. IMPRESSION: Successful placement of a right jugular tunneled dialysis catheter using ultrasound and fluoroscopic guidance. Electronically Signed   By: Markus Daft M.D.   On: 01/04/2021 20:02   IR TUNNELED CENTRAL VENOUS CATHETER PLACEMENT  Result Date: 01/04/2021 INDICATION: 87 year old with renal failure and needs a tunneled dialysis catheter. EXAM: FLUOROSCOPIC AND ULTRASOUND GUIDED PLACEMENT OF A TUNNELED DIALYSIS CATHETER Physician: Stephan Minister. Anselm Pancoast, MD MEDICATIONS: Ancef 2 g; The antibiotic was administered within an appropriate time interval prior to skin puncture. ANESTHESIA/SEDATION: Versed 0.5 mg IV; Fentanyl 25 mcg IV; Moderate  Sedation Time:  20 minutes The patient was continuously monitored during the procedure by the interventional radiology nurse under my direct supervision. FLUOROSCOPY TIME:  Fluoroscopy Time: 24 seconds, 1 mGy COMPLICATIONS: None immediate. PROCEDURE: Informed consent was obtained for placement of a tunneled dialysis catheter. The patient was placed supine on the interventional table. Ultrasound confirmed a patent right internal jugular vein. Ultrasound images were obtained for documentation. The right neck and chest was prepped and draped in a sterile fashion. Maximal barrier sterile technique was utilized including caps, mask, sterile gowns, sterile gloves, sterile drape, hand hygiene and skin antiseptic. The right neck was anesthetized with 1% lidocaine. A small incision was made with #11 blade scalpel. A 21 gauge needle directed into the right internal jugular vein with ultrasound guidance. A micropuncture dilator set was placed. A 23 cm tip to cuff Palindrome catheter was selected. The skin below the right clavicle was anesthetized and a small incision was made with an #11 blade scalpel. A subcutaneous tunnel was formed to the vein dermatotomy site. The catheter was brought through the tunnel. The vein dermatotomy site was dilated to accommodate a peel-away sheath. The catheter was placed through the peel-away sheath and directed into the central venous structures. The tip of the catheter was placed at the superior cavoatrial junction with fluoroscopy. Fluoroscopic images were obtained for documentation. Both lumens were found to aspirate and flush well. The proper amount of heparin was flushed in both lumens. The vein dermatotomy site was closed using a single layer of absorbable suture and Dermabond. The catheter was secured to the skin using Prolene suture. IMPRESSION: Successful placement of a right jugular tunneled dialysis catheter using ultrasound and fluoroscopic guidance. Electronically Signed   By:  Markus Daft M.D.   On: 01/04/2021 20:02    Scheduled Meds:  (feeding supplement) PROSource Plus  30 mL Oral TID BM   aspirin  81 mg Oral QPC supper   atorvastatin  20 mg Oral Daily   Chlorhexidine Gluconate Cloth  6 each Topical Daily   Chlorhexidine Gluconate Cloth  6 each Topical Q0600   feeding supplement  1 Container Oral TID BM  ferrous sulfate  325 mg Oral Q breakfast   heparin injection (subcutaneous)  5,000 Units Subcutaneous Q8H   heparin sodium (porcine)       senna-docusate  1 tablet Oral BID   Continuous Infusions:  sodium chloride 10 mL/hr at 01/01/21 1026   sodium chloride     sodium chloride       LOS: 26 days   Marylu Lund, MD [facility] Pager On Amion  If 7PM-7AM, please contact night-coverage 01/05/2021, 1:30 PM

## 2021-01-05 NOTE — Progress Notes (Signed)
Renal Navigator has spoken with [facility] to see if there is a MWF schedule at a clinic anywhere near [facility] to accommodate this request, however, there are no MWF schedules at this time. TOC CSW informed. Patient has been accepted at [facility] on a TTS schedule with a seat time of 10:30am. He needs to arrive at 10:10am to his appointments.  On his first day at the clinic (if this falls on a Tues or Thursday), patient needs to arrive at 9:30am to complete intake paperwork prior to his first treatment. If his first day at the clinic falls on a Saturday, patient will need to report to the clinic the Friday before in order to sign intake paperwork to have his first treatment on Saturday (arrival at 10:10am for 10:30am tx). Navigator will continue to follow closely and collaborate with CSW for discharge date.   Alphonzo Cruise, [place] Renal Navigator [phone]

## 2021-01-05 NOTE — TOC Progression Note (Signed)
Transition of Care [facility]) - Progression Note    Patient Details  Name: Andrew [name] MRN: [id number] Date of Birth: March 17, 1932  [facility]) CM/SW Contact  Joanne Chars, LCSW Phone Number: 01/05/2021, 3:11 PM  Clinical Narrative:   Per Jaclyn Shaggy, renal navigator, TTS schedule is only option for this pt.  CSW spoke with Museum/gallery conservator at [facility] and she is working with her Mudlogger on transportation for Saturday.  [facility] did report that they will not be able to take pt until Monday.      Expected Discharge Plan: Skilled Nursing Facility Barriers to Discharge: Ship broker, Continued Medical Work up  Expected Discharge Plan and Services Expected Discharge Plan: [place] Choice: [place] arrangements for the past 2 months: Single Family Home                                       Social Determinants of Health (SDOH) Interventions    Readmission Risk Interventions No flowsheet data found.

## 2021-01-05 NOTE — Progress Notes (Signed)
Renal Navigator faxed [facility] procedure note to [facility].   Alphonzo Cruise, [place] Renal Navigator [phone]

## 2021-01-05 NOTE — Progress Notes (Signed)
  AKI, non-oliguric - Presumably due to ischemic ATN in setting of hypotension and volume depletion.  Started on HD 12/20/20.  Had HD 12/29/20 and 6/11. - temp cath removed 01/02/21 - TDC placed with IR 01/04/21 - HD 01/04/21, next 01/06/21 Anemia - s/p ESA, transfused with HD Urinary retention - acute on chronic s/p foley cath placed 12/29/20 Acute hypoxic respiratory failure - associated with CAP.   CAP - resolved with abx Hypotension - currently on midodrine Disposition - pt with significant debility and needs PT/OT.  Going to [facility] and has TTS OP HD chair.  Ok to go from renal perspective.    Subjective:    S/p TDC placement and HD yesterday.  Looks brighter today and feeling better.     Objective:   BP (!) 147/61 (BP Location: Left Arm)   Pulse 74   Temp 98.4 F (36.9 C)   Resp 17   Ht 5\' 8"  (1.727 m)   Wt 62.7 kg   SpO2 98%   BMI 21.02 kg/m   Physical Exam: Gen: NAD, sitting in bed, HOH CVS: RRR Resp: clear Abd: soft Ext: no LE edema ACCESS: R TDC c/d/i  Labs: BMET Recent Labs  Lab 12/31/20 0128 01/01/21 0349 01/02/21 0327 01/03/21 0434 01/04/21 0044 01/04/21 1055 01/05/21 0436  NA 136 139 138 140 138 140 137  K 3.8 3.7 4.2 4.0 3.6 3.9 3.5  CL 98 102 102 103 102 104 96*  CO2 25 24 27 26 24 22 29   GLUCOSE 94 88 92 83 97 92 93  BUN 50* 64* 33* 55* 66* 67* 25*  CREATININE 4.53* 5.21* 3.30* 4.39* 5.02* 5.17* 2.57*  CALCIUM 8.1* 8.1* 8.3* 8.5* 8.0* 8.7* 8.2*  PHOS 5.9* 6.7* 4.3 6.2* 6.9* 6.8* 2.9   CBC Recent Labs  Lab 01/03/21 0434 01/04/21 0044 01/04/21 1055 01/05/21 0436  WBC 10.9* 13.1* 11.8* 14.7*  HGB 8.7* 8.3* 9.0* 8.3*  HCT 27.6* 26.5* 29.5* 25.6*  MCV 98.6 96.7 98.0 95.5  PLT 198 190 216 171      Medications:     (feeding supplement) PROSource Plus  30 mL Oral TID BM   aspirin  81 mg Oral QPC supper   atorvastatin  20 mg Oral Daily   Chlorhexidine Gluconate Cloth  6 each Topical Daily   Chlorhexidine Gluconate Cloth  6 each Topical  Q0600   feeding supplement  1 Container Oral TID BM   ferrous sulfate  325 mg Oral Q breakfast   heparin injection (subcutaneous)  5,000 Units Subcutaneous Q8H   heparin sodium (porcine)       senna-docusate  1 tablet Oral BID     Madelon Lips MD 01/05/2021, 10:57 AM

## 2021-01-06 DIAGNOSIS — N179 Acute kidney failure, unspecified: Secondary | ICD-10-CM | POA: Diagnosis not present

## 2021-01-06 LAB — CBC
HCT: 26.7 % — ABNORMAL LOW (ref 39.0–52.0)
Hemoglobin: 8.4 g/dL — ABNORMAL LOW (ref 13.0–17.0)
MCH: 30.8 pg (ref 26.0–34.0)
MCHC: 31.5 g/dL (ref 30.0–36.0)
MCV: 97.8 fL (ref 80.0–100.0)
Platelets: 159 10*3/uL (ref 150–400)
RBC: 2.73 MIL/uL — ABNORMAL LOW (ref 4.22–5.81)
RDW: 15.4 % (ref 11.5–15.5)
WBC: 9.2 10*3/uL (ref 4.0–10.5)
nRBC: 0 % (ref 0.0–0.2)

## 2021-01-06 LAB — RENAL FUNCTION PANEL
Albumin: 2.5 g/dL — ABNORMAL LOW (ref 3.5–5.0)
Anion gap: 14 (ref 5–15)
BUN: 50 mg/dL — ABNORMAL HIGH (ref 8–23)
CO2: 27 mmol/L (ref 22–32)
Calcium: 8.1 mg/dL — ABNORMAL LOW (ref 8.9–10.3)
Chloride: 98 mmol/L (ref 98–111)
Creatinine, Ser: 4.05 mg/dL — ABNORMAL HIGH (ref 0.61–1.24)
GFR, Estimated: 14 mL/min — ABNORMAL LOW (ref >60)
Glucose, Bld: 89 mg/dL (ref 70–99)
Phosphorus: 5.1 mg/dL — ABNORMAL HIGH (ref 2.5–4.6)
Potassium: 3.8 mmol/L (ref 3.5–5.1)
Sodium: 139 mmol/L (ref 135–145)

## 2021-01-06 LAB — GLUCOSE, CAPILLARY: Glucose-Capillary: 114 mg/dL — ABNORMAL HIGH (ref 70–99)

## 2021-01-06 MED ORDER — DARBEPOETIN ALFA 40 MCG/0.4ML IJ SOSY: 40.0000 ug | PREFILLED_SYRINGE | INTRAMUSCULAR | Status: DC

## 2021-01-06 MED ORDER — KIDNEY FAILURE BOOK: Freq: Once | Status: AC

## 2021-01-06 MED ORDER — HEPARIN SODIUM (PORCINE) 1000 UNIT/ML IJ SOLN
INTRAMUSCULAR | Status: AC
  Filled 2021-01-06: qty 4

## 2021-01-06 NOTE — Progress Notes (Signed)
Dialysis Nurse's Note:  HD catheter insertion site noted to have oozing fresh bloody dng. No inflammation of site or purulent dng noted. Dsg changed per protocol.  Informed bedside nurse [facility], prior to transferring pt back to his room. Attempted to page Dr. Hollie Salk 2x but no call back received.

## 2021-01-06 NOTE — Progress Notes (Signed)
Nutrition Follow-up   DOCUMENTATION CODES:  Not applicable  INTERVENTION:  Continue 42ml Prosource Plus po TID, each supplement provides 100 kcals and 15 grams of protein Continue Magic cup BID with meals, each supplement provides 290 kcal and 9 grams of protein Continue Boost Breeze po TID, each supplement provides 250 kcal and 9 grams of protein Provided renal diet education material and will reinforce as able  NUTRITION DIAGNOSIS:  Inadequate oral intake related to poor appetite as evidenced by per patient/family report. ongoing  GOAL:  Patient will meet greater than or equal to 90% of their needs progressing  MONITOR:  PO intake, Supplement acceptance, Labs, Weight trends, I & O's  REASON FOR ASSESSMENT:  Consult Diet education  ASSESSMENT:  Pt admitted with ARF superimposed on CKD II; uremia; metabolic acidosis. PMH includes thoracic aortic aneurysm s/p stent graft repair, CAD s/p CABG (2010), AAA, HTN, CHF, BPH with urinary retention requiring intemittent self-catheterization, Afib.   5/30 s/p temp HD cath placement; trial HD initiation   6/03 re-Insertion of Non-tunneled Central Venous Catheter without US guidance change over wire for malfunctioning catheter 6/8 HD#2 6/11 HD#3 6/12 temp HD cath removed 6/14 s/p tunneled HD cath placed; HD#4  Pt is pending discharge, now with plans for continued HD as outpatient. RD consulted to provide diet education given plan for continued HD; however, pt was unavailable at time of RD visit (pt receiving HD#5). RD placed renal diet education at bedside and will follow-up as able to reinforce education.  Pt with good appetite since last RD assessment, 90-100% meal completion noted. Pt also doing well with supplements per RN. Recommend continue current nutrition plan of care.    790ml net UF from HD today  UOP: 3101ml x24 hours I/O: -13,963ml since admit  Admission weight: 69.7 kg Current weight: 66.8 kg  Medications:   (feeding  supplement) PROSource Plus  30 mL Oral TID BM   aspirin  81 mg Oral QPC supper   atorvastatin  20 mg Oral Daily   Chlorhexidine Gluconate Cloth  6 each Topical Daily   Chlorhexidine Gluconate Cloth  6 each Topical Q0600   [START ON 01/08/2021] darbepoetin (ARANESP) injection - DIALYSIS  40 mcg Intravenous Q Sat-HD   feeding supplement  1 Container Oral TID BM   ferrous sulfate  325 mg Oral Q breakfast   heparin injection (subcutaneous)  5,000 Units Subcutaneous Q8H   heparin sodium (porcine)       senna-docusate  1 tablet Oral BID   Labs: Recent Labs  Lab 01/01/21 0349 01/02/21 0327 01/03/21 0434 01/04/21 0044 01/04/21 1055 01/05/21 0436 01/06/21 0302  NA 139 138 140   < > 140 137 139  K 3.7 4.2 4.0   < > 3.9 3.5 3.8  CL 102 102 103   < > 104 96* 98  CO2 24 27 26    < > 22 29 27   BUN 64* 33* 55*   < > 67* 25* 50*  CREATININE 5.21* 3.30* 4.39*   < > 5.17* 2.57* 4.05*  CALCIUM 8.1* 8.3* 8.5*   < > 8.7* 8.2* 8.1*  MG 1.8 1.7 1.9  --   --   --   --   PHOS 6.7* 4.3 6.2*   < > 6.8* 2.9 5.1*  GLUCOSE 88 92 83   < > 92 93 89   < > = values in this interval not displayed.   Diet Order:   Diet Order  Diet renal with fluid restriction Fluid restriction: Other (see comments); Room service appropriate? Yes; Fluid consistency: Thin  Diet effective now                  EDUCATION NEEDS:  No education needs have been identified at this time  Skin:  Skin Assessment: Reviewed RN Assessment  Last BM:  6/16  Height:  Ht Readings from Last 1 Encounters:  12/10/20 5\' 8"  (1.727 m)   Weight:  Wt Readings from Last 1 Encounters:  01/06/21 66.8 kg   BMI:  Body mass index is 22.39 kg/m.  Estimated Nutritional Needs:  Kcal:  3818-4037 Protein:  85-95g Fluid:  >1.75L/d    Larkin Ina, MS, RD, LDN RD pager number and weekend/on-call pager number located in [place].

## 2021-01-06 NOTE — TOC Progression Note (Signed)
Transition of Care [facility]) - Progression Note    Patient Details  Name: Andrew [name] MRN: [id number] Date of Birth: 05-09-1932  [facility] CM/SW Contact  Joanne Chars, LCSW Phone Number: 01/06/2021, 9:51 AM  Clinical Narrative:   CSW spoke with Terri Piedra regarding potential DC tomorrow.  She checked with HD clinic who cannot start pt prior to next Tuesday so DC before Monday not an option.    CSW spoke with AMber at [facility], they are able to provide HD transportation on Saturdays so no outside provider will be needed.     Need to start [place] on Saturday.    Expected Discharge Plan: Skilled Nursing Facility Barriers to Discharge: Ship broker, Continued Medical Work up  Expected Discharge Plan and Services Expected Discharge Plan: [place] Choice: [place] arrangements for the past 2 months: Single Family Home                                       Social Determinants of Health (SDOH) Interventions    Readmission Risk Interventions No flowsheet data found.

## 2021-01-06 NOTE — Progress Notes (Signed)
PROGRESS NOTE    Andrew [name]  RKY:[id number] DOB: 1932/04/24 DOA: 12/10/2020 PCP: [name], MD    Brief Narrative:  87 y.o. male with a history of thoracic aortic aneurysm status post stent graft repair, AAA, CABG in 2010, chronic HFpEF, BPH with urinary retention, CKD II, and remote atrial fibrillation not anticoagulated who presented to the ED 5/20 prompted by PCP for abnormal blood work which was drawn in the setting of cough that hadn't improved with augmentin. He also had been losing appetite and feeling unwell.   Labs were notable for mild leukocytosis, worsening anemia, renal failure. CT the chest, abdomen, and pelvis is notable for new left upper lobe infiltrate, unchanged stent graft repair of thoracic aortic aneurysm, and grossly stable infrarenal AAA.  Patient was treated with rocephin, azithromycin, albuterol, and IV fluids in the ED. SBP nadir was in 70's shortly after admission. Creatinine was 8.71 worsening to 9.32 from previous of 1.09 in Aug 2021. Nephrology was consulted. Further goals of care discussions have continued. The patient and family now confirm their desire for trial of hemodialysis if there continues to be no renal recovery.  Nontunneled HD catheter was placed 5/30.  Nephrology providing intermittent dialysis.  Plans to convert nontunneled HD to tunneled HD anticipation for outpatient dialysis per nephrology.    Assessment & Plan:   Principal Problem:   AKI (acute kidney injury) ([place]) Active Problems:   Thoracic aortic aneurysm without rupture ([place])   Coronary artery disease   Urinary retention   AAA (abdominal aortic aneurysm) without rupture (H[facility])   Essential hypertension   Normocytic anemia   Chronic diastolic CHF (congestive heart failure) ([place])   Community acquired pneumonia  Acute kidney injury, nonoliguric.  Suspect ATN -Although adequate urine output, renal function and uremia worsens without dialysis.  Creatinine now 2.57 - Nontunneled  HD placed 5/30, removed 6/12.  Now s/p tunneled HD cath placed 6/14 - Nephro following. Cont HD per Nephrology -Cont to follow lytes   Leukocytosis -stable -No evidence of acute infection   Acute on chronic urinary retention - Foley catheter removed 6/11.  Straight in and out cath every 8 hours-he does it at home.  But continues to retain, difficult Foley placement.  May need coud catheter   Acute hypoxic respiratory failure: Due to CAP, possible aspiration, possible renal failure-related volume overload. -Resolved.  Recently completed antibiotics.  For now continue as needed bronchodilators.  I-S/flutter.  cont with aspiration precautions   Left upper lobe community-acquired pneumonia - Completed 5 days of Rocephin and azithromycin, stable   Hypotension, intermittent -Elevated blood pressure at her follow-up change midodrine 5 mg 3 times daily to as needed - Random cortisol and TSH-normal -BP currently stable   CAD: History of PCI and CABG 2010. No anginal complaints currently.  Intermittent asymptomatic bradycardia/PAC --Continue aspirin and statin - Follows outpatient cardiology, Dr. Debara Pickett   Chronic HFpEF: Echo 5/26 unchanged from prior with LVEF 60-65%, unevaluated diastolic function, no RWMAs. Normal RV, no severe valvular disease. -HD per nephrology   Thoracic aortic aneurysm s/p repair: -Stable   AAA: Appears stable on CT in ED  - VVS appointment with repeat U/S on 6/21 with Dr. Carlis Abbott.   Iron deficiency anemia: No acute bleeding noted. -Iron supplements with bowel regimen. Hb 7.4 - Status post 1 unit PRBC 6/11.  Hgb has since remained stable   Weakness -Plan SNF at time of d/c    DVT prophylaxis: Heparin subq Code Status: Full Family Communication: Pt  in room, family today not at bedside  Status is: Inpatient  Remains inpatient appropriate because:Inpatient level of care appropriate due to severity of illness  Dispo: The patient is from: Home               Anticipated d/c is to: SNF              Patient currently is not medically stable to d/c.   Difficult to place patient No    Consultants:  Nephrology IR Palliative Care  Procedures:    Antimicrobials: Anti-infectives (From admission, onward)    Start     Dose/Rate Route Frequency Ordered Stop   01/04/21 1130  ceFAZolin (ANCEF) IVPB 2g/100 mL premix        2 g 200 mL/hr over 30 Minutes Intravenous To Radiology 01/04/21 1031 01/04/21 1632   12/11/20 2000  cefTRIAXone (ROCEPHIN) 2 g in sodium chloride 0.9 % 100 mL IVPB        2 g 200 mL/hr over 30 Minutes Intravenous Every 24 hours 12/10/20 2308 12/14/20 2321   12/11/20 2000  azithromycin (ZITHROMAX) 500 mg in sodium chloride 0.9 % 250 mL IVPB        500 mg 250 mL/hr over 60 Minutes Intravenous Every 24 hours 12/10/20 2308 12/14/20 2133   12/10/20 1945  cefTRIAXone (ROCEPHIN) 1 g in sodium chloride 0.9 % 100 mL IVPB        1 g 200 mL/hr over 30 Minutes Intravenous  Once 12/10/20 1940 12/10/20 2037   12/10/20 1945  azithromycin (ZITHROMAX) 500 mg in sodium chloride 0.9 % 250 mL IVPB        500 mg 250 mL/hr over 60 Minutes Intravenous  Once 12/10/20 1940 12/10/20 2142       Subjective: Feels cold on HD  Objective: Vitals:   01/06/21 1100 01/06/21 1130 01/06/21 1210 01/06/21 1414  BP: 119/71 135/83 137/86 114/66  Pulse: 83 72 79 63  Resp: 17 16 16 16   Temp:    99.7 F (37.6 C)  TempSrc:    Oral  SpO2:    94%  Weight:   66.8 kg   Height:        Intake/Output Summary (Last 24 hours) at 01/06/2021 1549 Last data filed at 01/06/2021 1210 Gross per 24 hour  Intake 150 ml  Output 1088 ml  Net -938 ml    Filed Weights   01/06/21 0500 01/06/21 0855 01/06/21 1210  Weight: 62 kg 67.5 kg 66.8 kg    Examination: General exam: Conversant, in no acute distress Respiratory system: normal chest rise, clear, no audible wheezing Cardiovascular system: regular rhythm, s1-s2 Gastrointestinal system: Nondistended, nontender,  pos BS Central nervous system: No seizures, no tremors Extremities: No cyanosis, no joint deformities Skin: No rashes, no pallor Psychiatry: Affect normal // no auditory hallucinations   Data Reviewed: I have personally reviewed following labs and imaging studies  CBC: Recent Labs  Lab 01/03/21 0434 01/04/21 0044 01/04/21 1055 01/05/21 0436 01/06/21 0302  WBC 10.9* 13.1* 11.8* 14.7* 9.2  HGB 8.7* 8.3* 9.0* 8.3* 8.4*  HCT 27.6* 26.5* 29.5* 25.6* 26.7*  MCV 98.6 96.7 98.0 95.5 97.8  PLT 198 190 216 171 443    Basic Metabolic Panel: Recent Labs  Lab 12/31/20 0128 01/01/21 0349 01/02/21 0327 01/03/21 0434 01/04/21 0044 01/04/21 1055 01/05/21 0436 01/06/21 0302  NA 136 139 138 140 138 140 137 139  K 3.8 3.7 4.2 4.0 3.6 3.9 3.5 3.8  CL 98 102 102 103  102 104 96* 98  CO2 25 24 27 26 24 22 29 27   GLUCOSE 94 88 92 83 97 92 93 89  BUN 50* 64* 33* 55* 66* 67* 25* 50*  CREATININE 4.53* 5.21* 3.30* 4.39* 5.02* 5.17* 2.57* 4.05*  CALCIUM 8.1* 8.1* 8.3* 8.5* 8.0* 8.7* 8.2* 8.1*  MG 1.6* 1.8 1.7 1.9  --   --   --   --   PHOS 5.9* 6.7* 4.3 6.2* 6.9* 6.8* 2.9 5.1*    GFR: Estimated Creatinine Clearance: 12.4 mL/min (A) (by C-G formula based on SCr of 4.05 mg/dL (H)). Liver Function Tests: Recent Labs  Lab 01/03/21 0434 01/04/21 0044 01/04/21 1055 01/05/21 0436 01/06/21 0302  ALBUMIN 2.6* 2.5* 2.9* 2.6* 2.5*    No results for input(s): LIPASE, AMYLASE in the last 168 hours. No results for input(s): AMMONIA in the last 168 hours. Coagulation Profile: No results for input(s): INR, PROTIME in the last 168 hours. Cardiac Enzymes: No results for input(s): CKTOTAL, CKMB, CKMBINDEX, TROPONINI in the last 168 hours. BNP (last 3 results) Recent Labs    01/09/20 1443  PROBNP 1,018*    HbA1C: No results for input(s): HGBA1C in the last 72 hours. CBG: No results for input(s): GLUCAP in the last 168 hours. Lipid Profile: No results for input(s): CHOL, HDL, LDLCALC, TRIG,  CHOLHDL, LDLDIRECT in the last 72 hours. Thyroid Function Tests: No results for input(s): TSH, T4TOTAL, FREET4, T3FREE, THYROIDAB in the last 72 hours. Anemia Panel: No results for input(s): VITAMINB12, FOLATE, FERRITIN, TIBC, IRON, RETICCTPCT in the last 72 hours. Sepsis Labs: No results for input(s): PROCALCITON, LATICACIDVEN in the last 168 hours.  Recent Results (from the past 240 hour(s))  MRSA Next Gen by PCR, Nasal     Status: None   Collection Time: 01/01/21 10:17 PM  Result Value Ref Range Status   MRSA by PCR Next Gen NOT DETECTED NOT DETECTED Final    Comment: (NOTE) The GeneXpert MRSA Assay (FDA approved for NASAL specimens only), is one component of a comprehensive MRSA colonization surveillance program. It is not intended to diagnose MRSA infection nor to guide or monitor treatment for MRSA infections. Test performance is not FDA approved in patients less than 85 years old. Performed at [facility], [place] [address], [place], [place] [address]       Radiology Studies: IR US Guide Vasc Access Right  Result Date: 01/04/2021 INDICATION: 87 year old with renal failure and needs a tunneled dialysis catheter. EXAM: FLUOROSCOPIC AND ULTRASOUND GUIDED PLACEMENT OF A TUNNELED DIALYSIS CATHETER Physician: Stephan Minister. Anselm Pancoast, MD MEDICATIONS: Ancef 2 g; The antibiotic was administered within an appropriate time interval prior to skin puncture. ANESTHESIA/SEDATION: Versed 0.5 mg IV; Fentanyl 25 mcg IV; Moderate Sedation Time:  20 minutes The patient was continuously monitored during the procedure by the interventional radiology nurse under my direct supervision. FLUOROSCOPY TIME:  Fluoroscopy Time: 24 seconds, 1 mGy COMPLICATIONS: None immediate. PROCEDURE: Informed consent was obtained for placement of a tunneled dialysis catheter. The patient was placed supine on the interventional table. Ultrasound confirmed a patent right internal jugular vein. Ultrasound images were obtained for  documentation. The right neck and chest was prepped and draped in a sterile fashion. Maximal barrier sterile technique was utilized including caps, mask, sterile gowns, sterile gloves, sterile drape, hand hygiene and skin antiseptic. The right neck was anesthetized with 1% lidocaine. A small incision was made with #11 blade scalpel. A 21 gauge needle directed into the right internal jugular vein with  ultrasound guidance. A micropuncture dilator set was placed. A 23 cm tip to cuff Palindrome catheter was selected. The skin below the right clavicle was anesthetized and a small incision was made with an #11 blade scalpel. A subcutaneous tunnel was formed to the vein dermatotomy site. The catheter was brought through the tunnel. The vein dermatotomy site was dilated to accommodate a peel-away sheath. The catheter was placed through the peel-away sheath and directed into the central venous structures. The tip of the catheter was placed at the superior cavoatrial junction with fluoroscopy. Fluoroscopic images were obtained for documentation. Both lumens were found to aspirate and flush well. The proper amount of heparin was flushed in both lumens. The vein dermatotomy site was closed using a single layer of absorbable suture and Dermabond. The catheter was secured to the skin using Prolene suture. IMPRESSION: Successful placement of a right jugular tunneled dialysis catheter using ultrasound and fluoroscopic guidance. Electronically Signed   By: Markus Daft M.D.   On: 01/04/2021 20:02   IR TUNNELED CENTRAL VENOUS CATHETER PLACEMENT  Result Date: 01/04/2021 INDICATION: 87 year old with renal failure and needs a tunneled dialysis catheter. EXAM: FLUOROSCOPIC AND ULTRASOUND GUIDED PLACEMENT OF A TUNNELED DIALYSIS CATHETER Physician: Stephan Minister. Anselm Pancoast, MD MEDICATIONS: Ancef 2 g; The antibiotic was administered within an appropriate time interval prior to skin puncture. ANESTHESIA/SEDATION: Versed 0.5 mg IV; Fentanyl 25 mcg IV;  Moderate Sedation Time:  20 minutes The patient was continuously monitored during the procedure by the interventional radiology nurse under my direct supervision. FLUOROSCOPY TIME:  Fluoroscopy Time: 24 seconds, 1 mGy COMPLICATIONS: None immediate. PROCEDURE: Informed consent was obtained for placement of a tunneled dialysis catheter. The patient was placed supine on the interventional table. Ultrasound confirmed a patent right internal jugular vein. Ultrasound images were obtained for documentation. The right neck and chest was prepped and draped in a sterile fashion. Maximal barrier sterile technique was utilized including caps, mask, sterile gowns, sterile gloves, sterile drape, hand hygiene and skin antiseptic. The right neck was anesthetized with 1% lidocaine. A small incision was made with #11 blade scalpel. A 21 gauge needle directed into the right internal jugular vein with ultrasound guidance. A micropuncture dilator set was placed. A 23 cm tip to cuff Palindrome catheter was selected. The skin below the right clavicle was anesthetized and a small incision was made with an #11 blade scalpel. A subcutaneous tunnel was formed to the vein dermatotomy site. The catheter was brought through the tunnel. The vein dermatotomy site was dilated to accommodate a peel-away sheath. The catheter was placed through the peel-away sheath and directed into the central venous structures. The tip of the catheter was placed at the superior cavoatrial junction with fluoroscopy. Fluoroscopic images were obtained for documentation. Both lumens were found to aspirate and flush well. The proper amount of heparin was flushed in both lumens. The vein dermatotomy site was closed using a single layer of absorbable suture and Dermabond. The catheter was secured to the skin using Prolene suture. IMPRESSION: Successful placement of a right jugular tunneled dialysis catheter using ultrasound and fluoroscopic guidance. Electronically Signed    By: Markus Daft M.D.   On: 01/04/2021 20:02    Scheduled Meds:  heparin sodium (porcine)       (feeding supplement) PROSource Plus  30 mL Oral TID BM   aspirin  81 mg Oral QPC supper   atorvastatin  20 mg Oral Daily   Chlorhexidine Gluconate Cloth  6 each Topical Daily   Chlorhexidine  Gluconate Cloth  6 each Topical Q0600   [START ON 01/08/2021] darbepoetin (ARANESP) injection - DIALYSIS  40 mcg Intravenous Q Sat-HD   feeding supplement  1 Container Oral TID BM   ferrous sulfate  325 mg Oral Q breakfast   heparin injection (subcutaneous)  5,000 Units Subcutaneous Q8H   senna-docusate  1 tablet Oral BID   Continuous Infusions:  sodium chloride 10 mL/hr at 01/01/21 1026   sodium chloride     sodium chloride       LOS: 27 days   Marylu Lund, MD [facility] Pager On [place]  If 7PM-7AM, please contact night-coverage 01/06/2021, 3:49 PM

## 2021-01-06 NOTE — Progress Notes (Signed)
Rounded on patient today in correlation to transition to outpatient HD. Patient was found to be in dialysis with having treatment. Patient gave permission for conversation. Ordered consult to dietician and Kidney Failure book. Patient educated at the bedside regarding care of tunneled dialysis catheter. Patient with no fistula at this time. AV proper medication administration on HD days.  Patient also educated on the importance of adhering to scheduled dialysis treatments, the effects of fluid overload, hyperkalemia, hyperphosphatemia and proper medication administration on HD days. Patient capable of re-verbalizing via teach back method. Also educated patient on services available through the interdisciplinary team in the clinic setting. Patient with no further questions at this time. Handouts and contact information provided to patient for any further assistance and to provide to his wife. Will follow as appropriate.   Dorthey Sawyer, RN  Dialysis Nurse Coordinator Phone: [phone]

## 2021-01-06 NOTE — Plan of Care (Signed)
  Problem: Education: Goal: Knowledge of General Education information will improve Description: Including pain rating scale, medication(s)/side effects and non-pharmacologic comfort measures Outcome: Progressing   Problem: Activity: Goal: Risk for activity intolerance will decrease Outcome: Progressing   Problem: Nutrition: Goal: Adequate nutrition will be maintained Outcome: Progressing   Problem: Coping: Goal: Level of anxiety will decrease Outcome: Progressing   Problem: Elimination: Goal: Will not experience complications related to bowel motility Outcome: Progressing   Problem: Pain Managment: Goal: General experience of comfort will improve Outcome: Progressing   Problem: Safety: Goal: Ability to remain free from injury will improve Outcome: Progressing   Problem: Activity: Goal: Activity intolerance will improve Outcome: Progressing

## 2021-01-06 NOTE — Progress Notes (Signed)
Occupational Therapy Treatment Patient Details Name: Andrew [name] MRN: [id number] DOB: [birth year] Today's Date: 01/06/2021    History of present illness Pt is an 87 y.o. male admitted 12/10/20 with abnormal blood work. Workup for ARF with uremia. Imaging notable for PNA. S/p temprorary HD cath placement 5/30; iHD initiated. Plan for tunneled HD cath placement 6/13 or 6/14. PMH includes CABG, TAA, AAA, CHF, CKD2, remote afib (not on anticoagulation), back sx.   OT comments  Pt seen after HD session, with pt much more fatigued this session in comparison to previous OT sessions. Pt needed increased assist to stand to stedy with MAX A +2, once in standing pt with tremors with PT/OTA immediately returning pt to supine with tremors terminating and pt responding verbally. Pt would continue to benefit from skilled occupational therapy while admitted and after d/c to address the below listed limitations in order to improve overall functional mobility and facilitate independence with BADL participation. DC plan remains appropriate, will follow acutely per POC.    Follow Up Recommendations  SNF;Supervision/Assistance - 24 hour    Equipment Recommendations  Other (comment) (defer to next level of care)    Recommendations for Other Services      Precautions / Restrictions Precautions Precautions: Fall;Other (comment) Precaution Comments: Significant anxiety/fearful of falling; frequent urge to have BM with mobility Restrictions Weight Bearing Restrictions: No       Mobility Bed Mobility Overal bed mobility: Needs Assistance Bed Mobility: Rolling;Sidelying to Sit;Sit to Supine Rolling: Max assist Sidelying to sit: HOB elevated;Total assist;+2 for physical assistance;+2 for safety/equipment   Sit to supine: Total assist;+2 for physical assistance;+2 for safety/equipment Sit to sidelying: +2 for physical assistance;Mod assist General bed mobility comments: pt fatigued and required assist to  maneuver BLEs to EOB; due to fear/fatigue pt resistant to allowing OTA and PT to assist pt, but pt finally agreeable needing +2 total to elevate trunk and scoot hips fully to EOB    Transfers Overall transfer level: Needs assistance Equipment used: Ambulation equipment used Transfers: Sit to/from Stand Sit to Stand: Modified independent (Device/Increase time)         General transfer comment: pt able to rise to stedy and sit on seat on 2nd try with +2 max assist; began shaking and less responsive and assisted off seat with +2 total Assist    Balance Overall balance assessment: Needs assistance Sitting-balance support: Bilateral upper extremity supported;Feet supported;No upper extremity supported Sitting balance-Leahy Scale: Poor Sitting balance - Comments: Pt with strong posterior lean requiring initially total assist for support and progressed to min assist once achieved upright/midline posture Postural control: Posterior lean Standing balance support: Bilateral upper extremity supported;During functional activity Standing balance-Leahy Scale: Poor Standing balance comment: Reliant on UE support                           ADL either performed or assessed with clinical judgement   ADL Overall ADL's : (P) Needs assistance/impaired                         Toilet Transfer: Maximal assistance;+2 for physical assistance Toilet Transfer Details (indicate cue type and reason): unable to pivot in stedy, but able to stand to stedy with MAX A +2         Functional mobility during ADLs: Maximal assistance;+2 for physical assistance (to stand to stedy) General ADL Comments: pt more fatigued this session needing MAX A +2  to stand to stedy, continues to be limited by anxiety with all mobility     Vision       Perception     Praxis      Cognition Arousal/Alertness: Awake/alert Behavior During Therapy: Anxious Overall Cognitive Status: Impaired/Different from  baseline Area of Impairment: Attention;Memory;Following commands;Safety/judgement;Awareness                   Current Attention Level: Sustained Memory: Decreased short-term memory (repeated cues) Following Commands: Follows one step commands with increased time;Follows one step commands inconsistently Safety/Judgement: Decreased awareness of deficits;Decreased awareness of safety Awareness: Intellectual Problem Solving: Slow processing;Decreased initiation;Requires verbal cues;Requires tactile cues;Difficulty sequencing General Comments: Limited by significant anxiety/fear of falling, requries increased time to follow commands, wife present during session and providing support and encouragment        Exercises     Shoulder Instructions       General Comments Pt with upper body tremors once seated on stedy and despite eyes open was less responsive and immediately assisted to sit on EOB. Once pt on EOB, shaking stopped and pt alert and answering verbally    Pertinent Vitals/ Pain       Pain Assessment: Faces Faces Pain Scale: Hurts a little bit Pain Location: Generalized Pain Descriptors / Indicators: Discomfort;Tiring Pain Intervention(s): Limited activity within patient's tolerance  Home Living Family/patient expects to be discharged to:: Private residence Living Arrangements: Spouse/significant other Available Help at Discharge: Family;Available 24 hours/day Type of Home: Other(Comment) (townhouse) Home Access: Stairs to enter [employer] of Steps: 1 Entrance Stairs-Rails: Right Home Layout: One level     Bathroom Shower/Tub: Occupational psychologist: Handicapped height Bathroom Accessibility: Yes   Home Equipment: Environmental consultant - 2 wheels;Shower seat - built in;Grab bars - tub/shower      Lives With: Spouse    Prior Functioning/Environment Level of Independence: Independent with assistive device(s)        Comments: has been modified  independent with RW since March 2021 after his aortic aneurysm repair (wife takes him to gym 3x/week)   Frequency  Min 2X/week        Progress Toward Goals  OT Goals(current goals can now be found in the care plan section)  Progress towards OT goals: Progressing toward goals  Acute Rehab OT Goals Patient Stated Goal: to lay back down OT Goal Formulation: With patient Time For Goal Achievement: 01/12/21 (progressed goal date as OTR saw pt and updated goals on 6/8) Potential to Achieve Goals: Good  Plan Discharge plan remains appropriate;Frequency remains appropriate    Co-evaluation      Reason for Co-Treatment: Complexity of the patient's impairments (multi-system involvement);For patient/therapist safety PT goals addressed during session: Mobility/safety with mobility;Balance;Proper use of DME        AM-PAC OT "6 Clicks" Daily Activity     Outcome Measure   Help from another person eating meals?: A Little Help from another person taking care of personal grooming?: A Little Help from another person toileting, which includes using toliet, bedpan, or urinal?: A Lot Help from another person bathing (including washing, rinsing, drying)?: A Lot Help from another person to put on and taking off regular upper body clothing?: A Lot Help from another person to put on and taking off regular lower body clothing?: A Lot 6 Click Score: 14    End of Session Equipment Utilized During Treatment: Gait belt;Oxygen;Other (comment) (stedy; 3L [place])  OT Visit Diagnosis: Unsteadiness on feet (R26.81);Other abnormalities of  gait and mobility (R26.89);Muscle weakness (generalized) (M62.81);History of falling (Z91.81)   Activity Tolerance Patient tolerated treatment well   Patient Left in bed;with call bell/phone within reach;with bed alarm set;with family/visitor present   Nurse Communication Mobility status        Time: 8206-0156 OT Time Calculation (min): 38 min  Charges: OT  General Charges $OT Visit: 1 Visit OT Treatments $Therapeutic Activity: 23-37 mins  Harley Alto., COTA/L Acute Rehabilitation Services [place] 01/06/2021, 4:11 PM

## 2021-01-06 NOTE — Progress Notes (Signed)
  AKI, non-oliguric - Presumably due to ischemic ATN in setting of hypotension and volume depletion.  Started on HD 12/20/20.  Had HD 12/29/20 and 6/11. - temp cath removed 01/02/21 - TDC placed with IR 01/04/21 - HD 01/04/21, next today 01/06/21 Anemia - getting Aranesp 40 q Saturday, transfused with HD Urinary retention - acute on chronic s/p foley cath placed 12/29/20 Acute hypoxic respiratory failure - associated with CAP.   CAP - resolved with abx Hypotension - currently on midodrine Disposition - pt with significant debility and needs PT/OT.  Going to [facility] and has TTS OP HD chair.  Ok to go from renal perspective.    Subjective:    Seen and examined on HD.  BFR 300 mL/ min via TDC UF goal 1L.  UF off at present.  No issues.       Objective:   BP (!) 113/57 (BP Location: Left Arm)   Pulse 83   Temp 98.4 F (36.9 C) (Oral)   Resp 13   Ht 5\' 8"  (1.727 m)   Wt 67.5 kg   SpO2 99%   BMI 22.63 kg/m   Physical Exam: Gen: NAD, sitting in bed, HOH CVS: RRR Resp: clear Abd: soft Ext: no LE edema ACCESS: R TDC c/d/i  Labs: BMET Recent Labs  Lab 01/01/21 0349 01/02/21 0327 01/03/21 0434 01/04/21 0044 01/04/21 1055 01/05/21 0436 01/06/21 0302  NA 139 138 140 138 140 137 139  K 3.7 4.2 4.0 3.6 3.9 3.5 3.8  CL 102 102 103 102 104 96* 98  CO2 24 27 26 24 22 29 27   GLUCOSE 88 92 83 97 92 93 89  BUN 64* 33* 55* 66* 67* 25* 50*  CREATININE 5.21* 3.30* 4.39* 5.02* 5.17* 2.57* 4.05*  CALCIUM 8.1* 8.3* 8.5* 8.0* 8.7* 8.2* 8.1*  PHOS 6.7* 4.3 6.2* 6.9* 6.8* 2.9 5.1*   CBC Recent Labs  Lab 01/04/21 0044 01/04/21 1055 01/05/21 0436 01/06/21 0302  WBC 13.1* 11.8* 14.7* 9.2  HGB 8.3* 9.0* 8.3* 8.4*  HCT 26.5* 29.5* 25.6* 26.7*  MCV 96.7 98.0 95.5 97.8  PLT 190 216 171 159      Medications:     (feeding supplement) PROSource Plus  30 mL Oral TID BM   aspirin  81 mg Oral QPC supper   atorvastatin  20 mg Oral Daily   Chlorhexidine Gluconate Cloth  6 each Topical  Daily   Chlorhexidine Gluconate Cloth  6 each Topical Q0600   feeding supplement  1 Container Oral TID BM   ferrous sulfate  325 mg Oral Q breakfast   heparin injection (subcutaneous)  5,000 Units Subcutaneous Q8H   kidney failure book   Does not apply Once   senna-docusate  1 tablet Oral BID     Madelon Lips MD 01/06/2021, 10:55 AM

## 2021-01-06 NOTE — Progress Notes (Signed)
PT Cancellation Note  Patient Details Name: Andrew [name] MRN: [id number] DOB: 1932/05/01   Cancelled Treatment:    Reason Eval/Treat Not Completed: Patient at procedure or test/unavailable  Pt currently in hemodialysis. Will attempt to see later today as pt available and as schedule permits.   Arby Barrette, PT Pager [phone]   Rexanne Mano 01/06/2021, 12:28 PM

## 2021-01-06 NOTE — Progress Notes (Signed)
Physical Therapy Treatment Patient Details Name: Andrew [name] MRN: [id number] DOB: 05/27/1932 Today's Date: 01/06/2021    History of Present Illness Pt is an 87 y.o. male admitted 12/10/20 with abnormal blood work. Workup for ARF with uremia. Imaging notable for PNA. S/p temprorary HD cath placement 5/30; iHD initiated. Plan for tunneled HD cath placement 6/13 or 6/14. PMH includes CABG, TAA, AAA, CHF, CKD2, remote afib (not on anticoagulation), back sx.    PT Comments    Patient back from dialysis ~1 hour prior to session, however pt very fatigued and required increased assist compared to other recent sessions. See below for details.     Follow Up Recommendations  SNF;Supervision for mobility/OOB     Equipment Recommendations  Wheelchair (measurements PT);Wheelchair cushion (measurements PT);Hospital bed    Recommendations for Other Services       Precautions / Restrictions Precautions Precautions: Fall;Other (comment) Precaution Comments: Significant anxiety/fearful of falling; frequent urge to have BM with mobility    Mobility  Bed Mobility Overal bed mobility: Needs Assistance Bed Mobility: Rolling;Sidelying to Sit;Sit to Supine Rolling: Max assist Sidelying to sit: HOB elevated;Total assist;+2 for physical assistance;+2 for safety/equipment   Sit to supine: Total assist;+2 for physical assistance;+2 for safety/equipment   General bed mobility comments: pt fatigued and required assist to maneuver BLEs to EOB; due to fear/fatigue pt resistant to allowing OTA and PT to assist pt, but pt finally agreeable needing +2 total to elevate trunk and scoot hips fully to EOB    Transfers Overall transfer level: Needs assistance Equipment used: Ambulation equipment used Transfers: Sit to/from Stand Sit to Stand: +2 safety/equipment;Max assist;+2 physical assistance;From elevated surface         General transfer comment: pt able to rise to stedy and sit on seat on 2nd try  with +2 max assist; began shaking and less responsive and assisted off seat with +2 total Assist  Ambulation/Gait                 Stairs             Wheelchair Mobility    Modified Rankin (Stroke Patients Only)       Balance Overall balance assessment: Needs assistance Sitting-balance support: Bilateral upper extremity supported;Feet supported;No upper extremity supported Sitting balance-Leahy Scale: Poor Sitting balance - Comments: Pt with strong posterior lean requiring initially total assist for support and progressed to min assist once achieved upright/midline posture Postural control: Posterior lean Standing balance support: Bilateral upper extremity supported;During functional activity Standing balance-Leahy Scale: Poor Standing balance comment: Reliant on UE support                            Cognition Arousal/Alertness: Awake/alert Behavior During Therapy: Anxious Overall Cognitive Status: Impaired/Different from baseline Area of Impairment: Attention;Memory;Following commands;Safety/judgement;Awareness                   Current Attention Level: Sustained Memory: Decreased short-term memory (repeated cues) Following Commands: Follows one step commands with increased time;Follows one step commands inconsistently Safety/Judgement: Decreased awareness of deficits;Decreased awareness of safety Awareness: Intellectual Problem Solving: Slow processing;Decreased initiation;Requires verbal cues;Requires tactile cues;Difficulty sequencing General Comments: Limited by significant anxiety/fear of falling, requries increased time to follow commands, wife present during session and providing support and encouragment      Exercises      General Comments General comments (skin integrity, edema, etc.): Pt with upper body tremors once seated on stedy and  despite eyes open was less responsive and immediately assisted to sit on EOB. Once pt on EOB,  shaking stopped and pt alert and answering verbally      Pertinent Vitals/Pain Pain Assessment: Faces Faces Pain Scale: Hurts a little bit Pain Location: Generalized Pain Descriptors / Indicators: Discomfort;Tiring Pain Intervention(s): Limited activity within patient's tolerance    Home Living Family/patient expects to be discharged to:: Private residence Living Arrangements: Spouse/significant other Available Help at Discharge: Family;Available 24 hours/day Type of Home: Other(Comment) (townhouse) Home Access: Stairs to enter Entrance Stairs-Rails: Right Home Layout: One level Home Equipment: Walker - 2 wheels;Shower seat - built in;Grab bars - tub/shower      Prior Function Level of Independence: Independent with assistive device(s)      Comments: has been modified independent with RW since March 2021 after his aortic aneurysm repair (wife takes him to gym 3x/week)   PT Goals (current goals can now be found in the care plan section) Acute Rehab PT Goals Patient Stated Goal: to get procedure over with Time For Goal Achievement: 01/10/21 Potential to Achieve Goals: Good Progress towards PT goals: Not progressing toward goals - comment (pt had dialysis today and likely fatigued)    Frequency    Min 2X/week      PT Plan Current plan remains appropriate    Co-evaluation PT/OT/SLP Co-Evaluation/Treatment: Yes Reason for Co-Treatment: Complexity of the patient's impairments (multi-system involvement);For patient/therapist safety PT goals addressed during session: Mobility/safety with mobility;Balance;Proper use of DME        AM-PAC PT "6 Clicks" Mobility   Outcome Measure  Help needed turning from your back to your side while in a flat bed without using bedrails?: A Lot Help needed moving from lying on your back to sitting on the side of a flat bed without using bedrails?: Total Help needed moving to and from a bed to a chair (including a wheelchair)?: Total Help  needed standing up from a chair using your arms (e.g., wheelchair or bedside chair)?: Total Help needed to walk in hospital room?: Total Help needed climbing 3-5 steps with a railing? : Total 6 Click Score: 7    End of Session Equipment Utilized During Treatment: Gait belt;Oxygen Activity Tolerance: Patient limited by fatigue;Other (comment) (pt limited by anxiety/fearful of falling) Patient left: with call bell/phone within reach;with family/visitor present;in bed;with bed alarm set Nurse Communication: Mobility status;Need for lift equipment PT Visit Diagnosis: Muscle weakness (generalized) (M62.81);Difficulty in walking, not elsewhere classified (R26.2)     Time: 3300-7622 PT Time Calculation (min) (ACUTE ONLY): 37 min  Charges:  $Therapeutic Activity: 8-22 mins                      Arby Barrette, PT Pager [phone]    Rexanne Mano 01/06/2021, 3:35 PM

## 2021-01-07 DIAGNOSIS — N179 Acute kidney failure, unspecified: Secondary | ICD-10-CM | POA: Diagnosis not present

## 2021-01-07 DIAGNOSIS — J9601 Acute respiratory failure with hypoxia: Secondary | ICD-10-CM | POA: Diagnosis not present

## 2021-01-07 LAB — CBC
HCT: 26 % — ABNORMAL LOW (ref 39.0–52.0)
Hemoglobin: 8.1 g/dL — ABNORMAL LOW (ref 13.0–17.0)
MCH: 30.3 pg (ref 26.0–34.0)
MCHC: 31.2 g/dL (ref 30.0–36.0)
MCV: 97.4 fL (ref 80.0–100.0)
Platelets: 152 10*3/uL (ref 150–400)
RBC: 2.67 MIL/uL — ABNORMAL LOW (ref 4.22–5.81)
RDW: 15.2 % (ref 11.5–15.5)
WBC: 13.5 10*3/uL — ABNORMAL HIGH (ref 4.0–10.5)
nRBC: 0 % (ref 0.0–0.2)

## 2021-01-07 LAB — RENAL FUNCTION PANEL
Albumin: 2.4 g/dL — ABNORMAL LOW (ref 3.5–5.0)
Anion gap: 12 (ref 5–15)
BUN: 37 mg/dL — ABNORMAL HIGH (ref 8–23)
CO2: 28 mmol/L (ref 22–32)
Calcium: 8.3 mg/dL — ABNORMAL LOW (ref 8.9–10.3)
Chloride: 99 mmol/L (ref 98–111)
Creatinine, Ser: 3.31 mg/dL — ABNORMAL HIGH (ref 0.61–1.24)
GFR, Estimated: 17 mL/min — ABNORMAL LOW (ref >60)
Glucose, Bld: 92 mg/dL (ref 70–99)
Phosphorus: 4.6 mg/dL (ref 2.5–4.6)
Potassium: 3.7 mmol/L (ref 3.5–5.1)
Sodium: 139 mmol/L (ref 135–145)

## 2021-01-07 MED ORDER — DIPHENHYDRAMINE HCL 25 MG PO CAPS
25.0000 mg | ORAL_CAPSULE | Freq: Once | ORAL | Status: AC | PRN
  Administered 2021-01-07: 25 mg via ORAL
  Filled 2021-01-07: qty 1

## 2021-01-07 NOTE — TOC Progression Note (Signed)
Transition of Care [facility]) - Progression Note    Patient Details  Name: Andrew [name] MRN: [id number] Date of Birth: 1931/08/09  [facility] CM/SW Contact  Andrew Chars, LCSW Phone Number: 01/07/2021, 2:38 PM  Clinical Narrative:   Andrew [name], renal navigator, reports pt needs hoyer pad to take with him to HD center.  [facility] has hoyer lift but requires individual pt pad.  CSW spoke with Andrew [name] at [facility] and she will look into whether they can provide the pad without the lift.     Expected Discharge Plan: Skilled Nursing Facility Barriers to Discharge: Ship broker, Continued Medical Work up  Expected Discharge Plan and Services Expected Discharge Plan: [place] Choice: [place] arrangements for the past 2 months: Single Family Home                                       Social Determinants of Health (SDOH) Interventions    Readmission Risk Interventions No flowsheet data found.

## 2021-01-07 NOTE — Progress Notes (Signed)
  AKI, non-oliguric - Presumably due to ischemic ATN in setting of hypotension and volume depletion.  Started on HD 12/20/20.   - temp cath removed 01/02/21 - TDC placed with IR 01/04/21 - HD 01/04/21, 01/06/21; next 01/08/21.   Anemia - getting Aranesp 40 q Saturday, transfused with HD Urinary retention - acute on chronic s/p foley cath placed 12/29/20 Acute hypoxic respiratory failure - associated with CAP.   CAP - resolved with abx Hypotension - currently on midodrine Disposition - pt with significant debility and needs PT/OT.  Going to [facility] and has TTS OP HD chair.  Ok to go from renal perspective.  For d/c Monday  Subjective:    Seen in room.  Just about to eat breakfast.  Wife at bedside.  No complaints today     Objective:   BP (!) 128/58 (BP Location: Right Arm)   Pulse 60   Temp 97.8 F (36.6 C)   Resp 17   Ht 5\' 8"  (1.727 m)   Wt 61.2 kg   SpO2 95%   BMI 20.51 kg/m   Physical Exam: Gen: NAD, sitting in bed, HOH CVS: RRR Resp: clear Abd: soft Ext: no LE edema ACCESS: R TDC c/d/i  Labs: BMET Recent Labs  Lab 01/02/21 0327 01/03/21 0434 01/04/21 0044 01/04/21 1055 01/05/21 0436 01/06/21 0302 01/07/21 0248  NA 138 140 138 140 137 139 139  K 4.2 4.0 3.6 3.9 3.5 3.8 3.7  CL 102 103 102 104 96* 98 99  CO2 27 26 24 22 29 27 28   GLUCOSE 92 83 97 92 93 89 92  BUN 33* 55* 66* 67* 25* 50* 37*  CREATININE 3.30* 4.39* 5.02* 5.17* 2.57* 4.05* 3.31*  CALCIUM 8.3* 8.5* 8.0* 8.7* 8.2* 8.1* 8.3*  PHOS 4.3 6.2* 6.9* 6.8* 2.9 5.1* 4.6   CBC Recent Labs  Lab 01/04/21 1055 01/05/21 0436 01/06/21 0302 01/07/21 0248  WBC 11.8* 14.7* 9.2 13.5*  HGB 9.0* 8.3* 8.4* 8.1*  HCT 29.5* 25.6* 26.7* 26.0*  MCV 98.0 95.5 97.8 97.4  PLT 216 171 159 152      Medications:     (feeding supplement) PROSource Plus  30 mL Oral TID BM   aspirin  81 mg Oral QPC supper   atorvastatin  20 mg Oral Daily   Chlorhexidine Gluconate Cloth  6 each Topical Daily   Chlorhexidine  Gluconate Cloth  6 each Topical Q0600   [START ON 01/08/2021] darbepoetin (ARANESP) injection - DIALYSIS  40 mcg Intravenous Q Sat-HD   feeding supplement  1 Container Oral TID BM   ferrous sulfate  325 mg Oral Q breakfast   heparin injection (subcutaneous)  5,000 Units Subcutaneous Q8H   senna-docusate  1 tablet Oral BID     Madelon Lips MD 01/07/2021, 9:47 AM

## 2021-01-07 NOTE — Progress Notes (Signed)
Pt c/o Itchiness to his L.arm/shoulder, scratch marks visible from scratching. Reached out to on call provider, obtained a PRN order for benadryl. See mar for details.

## 2021-01-07 NOTE — Progress Notes (Signed)
Renal Navigator met with patient and wife at bedside (they were unavailable yesterday when I attempted, as patient was in HD at that time) to ensure they too, in addition to SNF, have information about patient's outpatient HD clinic and seat schedule. Patient and wife given schedule letter and information verbally. Wife states understanding and appreciation. She states she will meet him at the HD clinic on his first day, Tuesday, 01/11/21 at 9:30am to sign his paperwork with him.  It appears from PT note yesterday that patient needs a steady and 2+max assist with transfers. I have asked TOC CSW to order a hoyer pad (or perhaps SNF can obtain this for patient) in order for transfer from wheelchair to HD recliner at HD. Navigator spoke with patient's wife about this as well. He will need hoyer pad to be under him in wheelchair when he arrives to HD in the event it is needed to lift him in to HD recliner. HD clinic updated.   Shaw, Colleen Elizabeth, LCSW Renal Navigator [phone] 

## 2021-01-07 NOTE — Progress Notes (Signed)
PROGRESS NOTE    Andrew [name]  KYH:[id number] DOB: 11/08/1931 DOA: 12/10/2020 PCP: [name], MD    Brief Narrative:  87 y.o. male with a history of thoracic aortic aneurysm status post stent graft repair, AAA, CABG in 2010, chronic HFpEF, BPH with urinary retention, CKD II, and remote atrial fibrillation not anticoagulated who presented to the ED 5/20 prompted by PCP for abnormal blood work which was drawn in the setting of cough that hadn't improved with augmentin. He also had been losing appetite and feeling unwell.   Labs were notable for mild leukocytosis, worsening anemia, renal failure. CT the chest, abdomen, and pelvis is notable for new left upper lobe infiltrate, unchanged stent graft repair of thoracic aortic aneurysm, and grossly stable infrarenal AAA.  Patient was treated with rocephin, azithromycin, albuterol, and IV fluids in the ED. SBP nadir was in 70's shortly after admission. Creatinine was 8.71 worsening to 9.32 from previous of 1.09 in Aug 2021. Nephrology was consulted. Further goals of care discussions have continued. The patient and family now confirm their desire for trial of hemodialysis if there continues to be no renal recovery.  Nontunneled HD catheter was placed 5/30.  Nephrology providing intermittent dialysis.  Plans to convert nontunneled HD to tunneled HD anticipation for outpatient dialysis per nephrology.    Assessment & Plan:   Principal Problem:   AKI (acute kidney injury) ([place]) Active Problems:   Thoracic aortic aneurysm without rupture ([place])   Coronary artery disease   Urinary retention   AAA (abdominal aortic aneurysm) without rupture (H[facility])   Essential hypertension   Normocytic anemia   Chronic diastolic CHF (congestive heart failure) ([place])   Community acquired pneumonia  Acute kidney injury, nonoliguric.  Suspect ATN -Although adequate urine output, renal function and uremia worsens without dialysis. - Nontunneled HD placed 5/30,  removed 6/12.  Now s/p tunneled HD cath placed 6/14 - Nephro following. Cont HD per Nephrology -Cr today 3.31 -Cont to follow lytes   Leukocytosis -stable -No evidence of acute infection   Acute on chronic urinary retention - Foley catheter removed 6/11.  Straight in and out cath every 8 hours-he does it at home.  But continues to retain, difficult Foley placement.     Acute hypoxic respiratory failure: Due to CAP, possible aspiration, possible renal failure-related volume overload. -Resolved.  Recently completed antibiotics.  For now continue as needed bronchodilators.  I-S/flutter.  cont with aspiration precautions   Left upper lobe community-acquired pneumonia - Completed 5 days of Rocephin and azithromycin, stable   Hypotension, intermittent -Elevated blood pressure at her follow-up change midodrine 5 mg 3 times daily to as needed - Random cortisol and TSH-normal -BP currently stable   CAD: History of PCI and CABG 2010. No anginal complaints currently.  Intermittent asymptomatic bradycardia/PAC --Continue aspirin and statin - Follows outpatient cardiology, Dr. Debara Pickett   Chronic HFpEF: Echo 5/26 unchanged from prior with LVEF 60-65%, unevaluated diastolic function, no RWMAs. Normal RV, no severe valvular disease. -HD per nephrology   Thoracic aortic aneurysm s/p repair: -Stable   AAA: Appears stable on CT in ED  - VVS appointment with repeat U/S on 6/21 with Dr. Carlis Abbott.   Iron deficiency anemia: No acute bleeding noted. -Iron supplements with bowel regimen. Hb 7.4 - Status post 1 unit PRBC 6/11.  Hgb has since remained stable   Weakness -Anticipating SNF at time of d/c    DVT prophylaxis: Heparin subq Code Status: Full Family Communication: Pt in room, family today  not at bedside  Status is: Inpatient  Remains inpatient appropriate because:Inpatient level of care appropriate due to severity of illness  Dispo: The patient is from: Home              Anticipated d/c  is to: SNF              Patient currently is not medically stable to d/c.   Difficult to place patient No    Consultants:  Nephrology IR Palliative Care  Procedures:    Antimicrobials: Anti-infectives (From admission, onward)    Start     Dose/Rate Route Frequency Ordered Stop   01/04/21 1130  ceFAZolin (ANCEF) IVPB 2g/100 mL premix        2 g 200 mL/hr over 30 Minutes Intravenous To Radiology 01/04/21 1031 01/04/21 1632   12/11/20 2000  cefTRIAXone (ROCEPHIN) 2 g in sodium chloride 0.9 % 100 mL IVPB        2 g 200 mL/hr over 30 Minutes Intravenous Every 24 hours 12/10/20 2308 12/14/20 2321   12/11/20 2000  azithromycin (ZITHROMAX) 500 mg in sodium chloride 0.9 % 250 mL IVPB        500 mg 250 mL/hr over 60 Minutes Intravenous Every 24 hours 12/10/20 2308 12/14/20 2133   12/10/20 1945  cefTRIAXone (ROCEPHIN) 1 g in sodium chloride 0.9 % 100 mL IVPB        1 g 200 mL/hr over 30 Minutes Intravenous  Once 12/10/20 1940 12/10/20 2037   12/10/20 1945  azithromycin (ZITHROMAX) 500 mg in sodium chloride 0.9 % 250 mL IVPB        500 mg 250 mL/hr over 60 Minutes Intravenous  Once 12/10/20 1940 12/10/20 2142       Subjective: Feels well today. Without complaints  Objective: Vitals:   01/06/21 1414 01/06/21 2055 01/07/21 0500 01/07/21 0635  BP: 114/66 119/72  (!) 128/58  Pulse: 63 90  60  Resp: 16 19  17   Temp: 99.7 F (37.6 C) 98.8 F (37.1 C)  97.8 F (36.6 C)  TempSrc: Oral Oral    SpO2: 94% 97%  95%  Weight:   61.2 kg   Height:        Intake/Output Summary (Last 24 hours) at 01/07/2021 1602 Last data filed at 01/07/2021 1400 Gross per 24 hour  Intake --  Output 800 ml  Net -800 ml    Filed Weights   01/06/21 0855 01/06/21 1210 01/07/21 0500  Weight: 67.5 kg 66.8 kg 61.2 kg    Examination: General exam: Awake, laying in bed, in nad Respiratory system: Normal respiratory effort, no wheezing Cardiovascular system: regular rate, s1, s2 Gastrointestinal  system: Soft, nondistended, positive BS Central nervous system: CN2-12 grossly intact, strength intact Extremities: Perfused, no clubbing Skin: Normal skin turgor, no notable skin lesions seen Psychiatry: Mood normal // no visual hallucinations   Data Reviewed: I have personally reviewed following labs and imaging studies  CBC: Recent Labs  Lab 01/04/21 0044 01/04/21 1055 01/05/21 0436 01/06/21 0302 01/07/21 0248  WBC 13.1* 11.8* 14.7* 9.2 13.5*  HGB 8.3* 9.0* 8.3* 8.4* 8.1*  HCT 26.5* 29.5* 25.6* 26.7* 26.0*  MCV 96.7 98.0 95.5 97.8 97.4  PLT 190 216 171 159 244    Basic Metabolic Panel: Recent Labs  Lab 01/01/21 0349 01/02/21 0327 01/03/21 0434 01/04/21 0044 01/04/21 1055 01/05/21 0436 01/06/21 0302 01/07/21 0248  NA 139 138 140 138 140 137 139 139  K 3.7 4.2 4.0 3.6 3.9 3.5 3.8 3.7  CL 102 102 103 102 104 96* 98 99  CO2 24 27 26 24 22 29 27 28   GLUCOSE 88 92 83 97 92 93 89 92  BUN 64* 33* 55* 66* 67* 25* 50* 37*  CREATININE 5.21* 3.30* 4.39* 5.02* 5.17* 2.57* 4.05* 3.31*  CALCIUM 8.1* 8.3* 8.5* 8.0* 8.7* 8.2* 8.1* 8.3*  MG 1.8 1.7 1.9  --   --   --   --   --   PHOS 6.7* 4.3 6.2* 6.9* 6.8* 2.9 5.1* 4.6    GFR: Estimated Creatinine Clearance: 13.9 mL/min (A) (by C-G formula based on SCr of 3.31 mg/dL (H)). Liver Function Tests: Recent Labs  Lab 01/04/21 0044 01/04/21 1055 01/05/21 0436 01/06/21 0302 01/07/21 0248  ALBUMIN 2.5* 2.9* 2.6* 2.5* 2.4*    No results for input(s): LIPASE, AMYLASE in the last 168 hours. No results for input(s): AMMONIA in the last 168 hours. Coagulation Profile: No results for input(s): INR, PROTIME in the last 168 hours. Cardiac Enzymes: No results for input(s): CKTOTAL, CKMB, CKMBINDEX, TROPONINI in the last 168 hours. BNP (last 3 results) Recent Labs    01/09/20 1443  PROBNP 1,018*    HbA1C: No results for input(s): HGBA1C in the last 72 hours. CBG: Recent Labs  Lab 01/06/21 2056  GLUCAP 114*   Lipid  Profile: No results for input(s): CHOL, HDL, LDLCALC, TRIG, CHOLHDL, LDLDIRECT in the last 72 hours. Thyroid Function Tests: No results for input(s): TSH, T4TOTAL, FREET4, T3FREE, THYROIDAB in the last 72 hours. Anemia Panel: No results for input(s): VITAMINB12, FOLATE, FERRITIN, TIBC, IRON, RETICCTPCT in the last 72 hours. Sepsis Labs: No results for input(s): PROCALCITON, LATICACIDVEN in the last 168 hours.  Recent Results (from the past 240 hour(s))  MRSA Next Gen by PCR, Nasal     Status: None   Collection Time: 01/01/21 10:17 PM  Result Value Ref Range Status   MRSA by PCR Next Gen NOT DETECTED NOT DETECTED Final    Comment: (NOTE) The GeneXpert MRSA Assay (FDA approved for NASAL specimens only), is one component of a comprehensive MRSA colonization surveillance program. It is not intended to diagnose MRSA infection nor to guide or monitor treatment for MRSA infections. Test performance is not FDA approved in patients less than 27 years old. Performed at [facility], [place] [address], [place], [place] [address]       Radiology Studies: No results found.  Scheduled Meds:  (feeding supplement) PROSource Plus  30 mL Oral TID BM   aspirin  81 mg Oral QPC supper   atorvastatin  20 mg Oral Daily   Chlorhexidine Gluconate Cloth  6 each Topical Daily   Chlorhexidine Gluconate Cloth  6 each Topical Q0600   [START ON 01/08/2021] darbepoetin (ARANESP) injection - DIALYSIS  40 mcg Intravenous Q Sat-HD   feeding supplement  1 Container Oral TID BM   ferrous sulfate  325 mg Oral Q breakfast   heparin injection (subcutaneous)  5,000 Units Subcutaneous Q8H   senna-docusate  1 tablet Oral BID   Continuous Infusions:  sodium chloride 10 mL/hr at 01/01/21 1026   sodium chloride     sodium chloride       LOS: 28 days   Marylu Lund, MD [facility] Pager On Amion  If 7PM-7AM, please contact night-coverage 01/07/2021, 4:02 PM

## 2021-01-08 DIAGNOSIS — I5032 Chronic diastolic (congestive) heart failure: Secondary | ICD-10-CM | POA: Diagnosis not present

## 2021-01-08 DIAGNOSIS — Z7189 Other specified counseling: Secondary | ICD-10-CM | POA: Diagnosis not present

## 2021-01-08 DIAGNOSIS — N179 Acute kidney failure, unspecified: Secondary | ICD-10-CM | POA: Diagnosis not present

## 2021-01-08 DIAGNOSIS — Z66 Do not resuscitate: Secondary | ICD-10-CM | POA: Diagnosis not present

## 2021-01-08 DIAGNOSIS — J9601 Acute respiratory failure with hypoxia: Secondary | ICD-10-CM | POA: Diagnosis not present

## 2021-01-08 DIAGNOSIS — Z515 Encounter for palliative care: Secondary | ICD-10-CM | POA: Diagnosis not present

## 2021-01-08 LAB — CBC
HCT: 27.5 % — ABNORMAL LOW (ref 39.0–52.0)
Hemoglobin: 8.4 g/dL — ABNORMAL LOW (ref 13.0–17.0)
MCH: 30.2 pg (ref 26.0–34.0)
MCHC: 30.5 g/dL (ref 30.0–36.0)
MCV: 98.9 fL (ref 80.0–100.0)
Platelets: 173 10*3/uL (ref 150–400)
RBC: 2.78 MIL/uL — ABNORMAL LOW (ref 4.22–5.81)
RDW: 14.8 % (ref 11.5–15.5)
WBC: 11.8 10*3/uL — ABNORMAL HIGH (ref 4.0–10.5)
nRBC: 0 % (ref 0.0–0.2)

## 2021-01-08 LAB — RENAL FUNCTION PANEL
Albumin: 2.6 g/dL — ABNORMAL LOW (ref 3.5–5.0)
Anion gap: 13 (ref 5–15)
BUN: 69 mg/dL — ABNORMAL HIGH (ref 8–23)
CO2: 25 mmol/L (ref 22–32)
Calcium: 8.2 mg/dL — ABNORMAL LOW (ref 8.9–10.3)
Chloride: 100 mmol/L (ref 98–111)
Creatinine, Ser: 4.65 mg/dL — ABNORMAL HIGH (ref 0.61–1.24)
GFR, Estimated: 12 mL/min — ABNORMAL LOW (ref >60)
Glucose, Bld: 75 mg/dL (ref 70–99)
Phosphorus: 5.5 mg/dL — ABNORMAL HIGH (ref 2.5–4.6)
Potassium: 3.3 mmol/L — ABNORMAL LOW (ref 3.5–5.1)
Sodium: 138 mmol/L (ref 135–145)

## 2021-01-08 MED ORDER — MIDODRINE HCL 5 MG PO TABS
ORAL_TABLET | ORAL | Status: AC
  Filled 2021-01-08: qty 1

## 2021-01-08 MED ORDER — POTASSIUM CHLORIDE CRYS ER 20 MEQ PO TBCR
40.0000 meq | EXTENDED_RELEASE_TABLET | Freq: Once | ORAL | Status: AC
  Administered 2021-01-08: 40 meq via ORAL
  Filled 2021-01-08: qty 2

## 2021-01-08 MED ORDER — DARBEPOETIN ALFA 40 MCG/0.4ML IJ SOSY
PREFILLED_SYRINGE | INTRAMUSCULAR | Status: AC
  Administered 2021-01-08: 40 ug via INTRAVENOUS
  Filled 2021-01-08: qty 0.4

## 2021-01-08 MED ORDER — HEPARIN SODIUM (PORCINE) 1000 UNIT/ML IJ SOLN
INTRAMUSCULAR | Status: AC
  Administered 2021-01-08: 1000 [IU] via INTRAVENOUS
  Filled 2021-01-08: qty 4

## 2021-01-08 NOTE — Progress Notes (Signed)
PROGRESS NOTE    Andrew [name]  WVP:[id number] DOB: [birth year] DOA: 12/10/2020 PCP: [name], MD    Brief Narrative:  87 y.o. male with a history of thoracic aortic aneurysm status post stent graft repair, AAA, CABG in 2010, chronic HFpEF, BPH with urinary retention, CKD II, and remote atrial fibrillation not anticoagulated who presented to the ED 5/20 prompted by PCP for abnormal blood work which was drawn in the setting of cough that hadn't improved with augmentin. He also had been losing appetite and feeling unwell.   Labs were notable for mild leukocytosis, worsening anemia, renal failure. CT the chest, abdomen, and pelvis is notable for new left upper lobe infiltrate, unchanged stent graft repair of thoracic aortic aneurysm, and grossly stable infrarenal AAA.  Patient was treated with rocephin, azithromycin, albuterol, and IV fluids in the ED. SBP nadir was in 70's shortly after admission. Creatinine was 8.71 worsening to 9.32 from previous of 1.09 in Aug 2021. Nephrology was consulted. Further goals of care discussions have continued. The patient and family now confirm their desire for trial of hemodialysis if there continues to be no renal recovery.  Nontunneled HD catheter was placed 5/30.  Nephrology providing intermittent dialysis.  Plans to convert nontunneled HD to tunneled HD anticipation for outpatient dialysis per nephrology.   Assessment & Plan:   Principal Problem:   AKI (acute kidney injury) ([place]) Active Problems:   Thoracic aortic aneurysm without rupture ([place])   Coronary artery disease   Urinary retention   AAA (abdominal aortic aneurysm) without rupture ([facility])   Essential hypertension   Normocytic anemia   Chronic diastolic CHF (congestive heart failure) ([place])   Community acquired pneumonia  Acute kidney injury, nonoliguric.  Suspect ATN -Although adequate urine output, renal function and uremia worsens without dialysis. - Nontunneled HD placed 5/30,  removed 6/12.  Now s/p tunneled HD cath placed 6/14 - Nephro following. Cont HD per Nephrology -Cr today 4.65 -Cont to follow bmet trends   Leukocytosis -stable -without evidence of acute infection at this time   Acute on chronic urinary retention - Foley catheter removed 6/11.  Straight in and out cath every 8 hours-he does it at home.  But continues to retain, difficult Foley placement.     Acute hypoxic respiratory failure: Due to CAP, possible aspiration, possible renal failure-related volume overload. -Resolved.  Recently completed antibiotics.  For now continue as needed bronchodilators.  I-S/flutter. Continue with aspiration precautions   Left upper lobe community-acquired pneumonia - Completed 5 days of Rocephin and azithromycin, stable   Hypotension, intermittent -Elevated blood pressure at her follow-up change midodrine 5 mg 3 times daily to as needed - Random cortisol and TSH-normal -BP remains stable   CAD: History of PCI and CABG 2010. No anginal complaints currently.  Intermittent asymptomatic bradycardia/PAC -Continued on aspirin and statin - Follows outpatient cardiology, Dr. Debara Pickett   Chronic HFpEF: Echo 5/26 unchanged from prior with LVEF 60-65%, unevaluated diastolic function, no RWMAs. Normal RV, no severe valvular disease. -HD per nephrology   Thoracic aortic aneurysm s/p repair: -Stable   AAA: Appears stable on CT in ED  - VVS appointment with repeat U/S on 6/21 with Dr. Carlis Abbott.   Iron deficiency anemia: No acute bleeding noted. -Iron supplements with bowel regimen. Hb 7.4 - Status post 1 unit PRBC 6/11.  Hgb has since remained stable   Weakness -Anticipating SNF at time of d/c    DVT prophylaxis: Heparin subq Code Status: Full Family Communication: Pt in  room, family today not at bedside  Status is: Inpatient  Remains inpatient appropriate because:Inpatient level of care appropriate due to severity of illness  Dispo: The patient is from: Home               Anticipated d/c is to: SNF              Patient currently is not medically stable to d/c.   Difficult to place patient No    Consultants:  Nephrology IR Palliative Care  Procedures:    Antimicrobials: Anti-infectives (From admission, onward)    Start     Dose/Rate Route Frequency Ordered Stop   01/04/21 1130  ceFAZolin (ANCEF) IVPB 2g/100 mL premix        2 g 200 mL/hr over 30 Minutes Intravenous To Radiology 01/04/21 1031 01/04/21 1632   12/11/20 2000  cefTRIAXone (ROCEPHIN) 2 g in sodium chloride 0.9 % 100 mL IVPB        2 g 200 mL/hr over 30 Minutes Intravenous Every 24 hours 12/10/20 2308 12/14/20 2321   12/11/20 2000  azithromycin (ZITHROMAX) 500 mg in sodium chloride 0.9 % 250 mL IVPB        500 mg 250 mL/hr over 60 Minutes Intravenous Every 24 hours 12/10/20 2308 12/14/20 2133   12/10/20 1945  cefTRIAXone (ROCEPHIN) 1 g in sodium chloride 0.9 % 100 mL IVPB        1 g 200 mL/hr over 30 Minutes Intravenous  Once 12/10/20 1940 12/10/20 2037   12/10/20 1945  azithromycin (ZITHROMAX) 500 mg in sodium chloride 0.9 % 250 mL IVPB        500 mg 250 mL/hr over 60 Minutes Intravenous  Once 12/10/20 1940 12/10/20 2142       Subjective: Feels well today. Without complaints  Objective: Vitals:   01/08/21 1200 01/08/21 1210 01/08/21 1220 01/08/21 1338  BP: (!) 139/56 (!) 145/63 (!) 149/86 (!) 159/60  Pulse: 67 72 72   Resp: (!) 26 (!) 22  18  Temp:  (!) 97.3 F (36.3 C)    TempSrc:  Oral    SpO2:  99%  100%  Weight:  58.5 kg    Height:        Intake/Output Summary (Last 24 hours) at 01/08/2021 1631 Last data filed at 01/08/2021 1359 Gross per 24 hour  Intake 237 ml  Output 100 ml  Net 137 ml    Filed Weights   01/07/21 0500 01/08/21 0900 01/08/21 1210  Weight: 61.2 kg 58.5 kg 58.5 kg    Examination: General exam: Conversant, in no acute distress Respiratory system: normal chest rise, clear, no audible wheezing Cardiovascular system: regular  rhythm, s1-s2 Gastrointestinal system: Nondistended, nontender, pos BS Central nervous system: No seizures, no tremors Extremities: No cyanosis, no joint deformities Skin: No rashes, no pallor Psychiatry: Affect normal // no auditory hallucinations   Data Reviewed: I have personally reviewed following labs and imaging studies  CBC: Recent Labs  Lab 01/04/21 1055 01/05/21 0436 01/06/21 0302 01/07/21 0248 01/08/21 0527  WBC 11.8* 14.7* 9.2 13.5* 11.8*  HGB 9.0* 8.3* 8.4* 8.1* 8.4*  HCT 29.5* 25.6* 26.7* 26.0* 27.5*  MCV 98.0 95.5 97.8 97.4 98.9  PLT 216 171 159 152 269    Basic Metabolic Panel: Recent Labs  Lab 01/02/21 0327 01/03/21 0434 01/04/21 0044 01/04/21 1055 01/05/21 0436 01/06/21 0302 01/07/21 0248 01/08/21 0527  NA 138 140   < > 140 137 139 139 138  K 4.2 4.0   < >  3.9 3.5 3.8 3.7 3.3*  CL 102 103   < > 104 96* 98 99 100  CO2 27 26   < > 22 29 27 28 25   GLUCOSE 92 83   < > 92 93 89 92 75  BUN 33* 55*   < > 67* 25* 50* 37* 69*  CREATININE 3.30* 4.39*   < > 5.17* 2.57* 4.05* 3.31* 4.65*  CALCIUM 8.3* 8.5*   < > 8.7* 8.2* 8.1* 8.3* 8.2*  MG 1.7 1.9  --   --   --   --   --   --   PHOS 4.3 6.2*   < > 6.8* 2.9 5.1* 4.6 5.5*   < > = values in this interval not displayed.    GFR: Estimated Creatinine Clearance: 9.4 mL/min (A) (by C-G formula based on SCr of 4.65 mg/dL (H)). Liver Function Tests: Recent Labs  Lab 01/04/21 1055 01/05/21 0436 01/06/21 0302 01/07/21 0248 01/08/21 0527  ALBUMIN 2.9* 2.6* 2.5* 2.4* 2.6*    No results for input(s): LIPASE, AMYLASE in the last 168 hours. No results for input(s): AMMONIA in the last 168 hours. Coagulation Profile: No results for input(s): INR, PROTIME in the last 168 hours. Cardiac Enzymes: No results for input(s): CKTOTAL, CKMB, CKMBINDEX, TROPONINI in the last 168 hours. BNP (last 3 results) No results for input(s): PROBNP in the last 8760 hours.  HbA1C: No results for input(s): HGBA1C in the last 72  hours. CBG: Recent Labs  Lab 01/06/21 2056  GLUCAP 114*    Lipid Profile: No results for input(s): CHOL, HDL, LDLCALC, TRIG, CHOLHDL, LDLDIRECT in the last 72 hours. Thyroid Function Tests: No results for input(s): TSH, T4TOTAL, FREET4, T3FREE, THYROIDAB in the last 72 hours. Anemia Panel: No results for input(s): VITAMINB12, FOLATE, FERRITIN, TIBC, IRON, RETICCTPCT in the last 72 hours. Sepsis Labs: No results for input(s): PROCALCITON, LATICACIDVEN in the last 168 hours.  Recent Results (from the past 240 hour(s))  MRSA Next Gen by PCR, Nasal     Status: None   Collection Time: 01/01/21 10:17 PM  Result Value Ref Range Status   MRSA by PCR Next Gen NOT DETECTED NOT DETECTED Final    Comment: (NOTE) The GeneXpert MRSA Assay (FDA approved for NASAL specimens only), is one component of a comprehensive MRSA colonization surveillance program. It is not intended to diagnose MRSA infection nor to guide or monitor treatment for MRSA infections. Test performance is not FDA approved in patients less than 29 years old. Performed at [facility], [place] [address] [place], [place] [address]       Radiology Studies: No results found.  Scheduled Meds:  (feeding supplement) PROSource Plus  30 mL Oral TID BM   aspirin  81 mg Oral QPC supper   atorvastatin  20 mg Oral Daily   Chlorhexidine Gluconate Cloth  6 each Topical Daily   Chlorhexidine Gluconate Cloth  6 each Topical Q0600   darbepoetin (ARANESP) injection - DIALYSIS  40 mcg Intravenous Q Sat-HD   feeding supplement  1 Container Oral TID BM   ferrous sulfate  325 mg Oral Q breakfast   heparin injection (subcutaneous)  5,000 Units Subcutaneous Q8H   senna-docusate  1 tablet Oral BID   Continuous Infusions:  sodium chloride 10 mL/hr at 01/01/21 1026   sodium chloride     sodium chloride       LOS: 29 days   Marylu Lund, MD [facility] Pager On Amion  If 7PM-7AM,  please contact night-coverage 01/08/2021,  4:31 PM

## 2021-01-08 NOTE — Progress Notes (Signed)
  AKI, non-oliguric - Presumably due to ischemic ATN in setting of hypotension and volume depletion.  Started on HD 12/20/20.   - temp cath removed 01/02/21 - TDC placed with IR 01/04/21 - HD 01/04/21, 01/06/21; next 01/08/21.   Anemia - getting Aranesp 40 q Saturday, transfused with HD Urinary retention - acute on chronic s/p foley cath placed 12/29/20 Acute hypoxic respiratory failure - associated with CAP.   CAP - resolved with abx Hypotension - currently on midodrine Disposition - pt with significant debility and needs PT/OT.  Going to [facility] and has TTS OP HD chair.  Ok to go from renal perspective.  For d/c Monday  Subjective:    HD today.  Reports arms are cold.  Seen and examined on HD.  BFR 300 via TDC UF goal even.     Objective:   BP (!) 118/47 Comment: Midodrine given  Pulse 70   Temp 98.7 F (37.1 C) (Oral)   Resp 14   Ht 5\' 8"  (1.727 m)   Wt 58.5 kg   SpO2 100%   BMI 19.61 kg/m   Physical Exam: Gen: NAD, lying in bed, appears tired CVS: RRR Resp: clear Abd: soft Ext: no LE edema ACCESS: R TDC c/d/i  Labs: BMET Recent Labs  Lab 01/03/21 0434 01/04/21 0044 01/04/21 1055 01/05/21 0436 01/06/21 0302 01/07/21 0248 01/08/21 0527  NA 140 138 140 137 139 139 138  K 4.0 3.6 3.9 3.5 3.8 3.7 3.3*  CL 103 102 104 96* 98 99 100  CO2 26 24 22 29 27 28 25   GLUCOSE 83 97 92 93 89 92 75  BUN 55* 66* 67* 25* 50* 37* 69*  CREATININE 4.39* 5.02* 5.17* 2.57* 4.05* 3.31* 4.65*  CALCIUM 8.5* 8.0* 8.7* 8.2* 8.1* 8.3* 8.2*  PHOS 6.2* 6.9* 6.8* 2.9 5.1* 4.6 5.5*   CBC Recent Labs  Lab 01/05/21 0436 01/06/21 0302 01/07/21 0248 01/08/21 0527  WBC 14.7* 9.2 13.5* 11.8*  HGB 8.3* 8.4* 8.1* 8.4*  HCT 25.6* 26.7* 26.0* 27.5*  MCV 95.5 97.8 97.4 98.9  PLT 171 159 152 173      Medications:     Darbepoetin Alfa       midodrine       (feeding supplement) PROSource Plus  30 mL Oral TID BM   aspirin  81 mg Oral QPC supper   atorvastatin  20 mg Oral Daily    Chlorhexidine Gluconate Cloth  6 each Topical Daily   Chlorhexidine Gluconate Cloth  6 each Topical Q0600   darbepoetin (ARANESP) injection - DIALYSIS  40 mcg Intravenous Q Sat-HD   feeding supplement  1 Container Oral TID BM   ferrous sulfate  325 mg Oral Q breakfast   heparin injection (subcutaneous)  5,000 Units Subcutaneous Q8H   heparin sodium (porcine)       potassium chloride  40 mEq Oral Once   senna-docusate  1 tablet Oral BID     Madelon Lips MD 01/08/2021, 11:01 AM

## 2021-01-08 NOTE — Progress Notes (Signed)
Palliative Medicine Inpatient Follow Up Note  REASON FOR CONSULTATION:Establishing goals of care   Palliative Care consult requested for goals of care discussion in this 87 y.o. male with a medical history significant for CABG (2010) thoracic aortic aneurysm s/p stent graft repair, diastolic CHF, BPH, urinary retention (self-catheterization 3 times per day), CKD stage II, atrial fibrillation (not anticoagulated), pretension, CAD, and AAA.  Patient presented to the ED from home with complaints of productive cough and abnormal lab work.  On admission patient reported productive cough x2 weeks with no improvement despite Augmentin ordered by his PCP.  During work-up BUN 161, creatinine 8.71, bicarb 17, CT of chest, abdomen, pelvis showed new left upper lobe infiltrate.  Patient receiving IV antibiotics.  Currently followed by nephrology.  Today's Discussion (01/08/2021):  *Please note that this is a verbal dictation therefore any spelling or grammatical errors are due to the "Dragon Medical One" system interpretation.  I met at bedside with Andrew [name] and his wife, Andrew [name] this morning. We reviewed his co-morbid conditions and reason for hospitalization.   Discussed his present need for HD and the affect that has had on him overall. We reviewed the exhaustion he feels from this on a daily basis. Offered him time to formally express these feelings. Reviewed the hope that dialysis is not for the long term.   We further discussed advance directives which have been completed and will be formally notarized tomorrow.  We took the time to review each of the MOST form components and completed it as below:  Cardiopulmonary Resuscitation: Do Not Attempt Resuscitation (DNR/No CPR)  Medical Interventions: Limited Additional Interventions: Use medical treatment, IV fluids and cardiac monitoring as indicated, DO NOT USE intubation or mechanical ventilation. May consider use of less invasive airway support such as  BiPAP or CPAP. Also provide comfort measures. Transfer to the hospital if indicated. Avoid intensive care.   Antibiotics: Determine use of limitation of antibiotics when infection occurs  IV Fluids: IV fluids for a defined trial period  Feeding Tube: No feeding tube    Questions and concerns addressed   Objective Assessment: Vital Signs Vitals:   01/07/21 2142 01/08/21 0544  BP: (!) 132/58 (!) 152/60  Pulse: (!) 58 (!) 49  Resp: 16   Temp: 97.8 F (36.6 C) 98.7 F (37.1 C)  SpO2: 100% 99%    Intake/Output Summary (Last 24 hours) at 01/08/2021 0748 Last data filed at 01/08/2021 0700 Gross per 24 hour  Intake 237 ml  Output 700 ml  Net -463 ml   Last Weight  Most recent update: 01/07/2021  6:25 AM    Weight  61.2 kg (134 lb 14.7 oz)            Gen:  Frail Elderly Caucasian M in NAD HEENT: Dry mucous membranes CV: Irregular rate PULM: clear to auscultation bilaterally  ABD: soft/nontender  EXT: No edema  Neuro: Alert and oriented x3   SUMMARY OF RECOMMENDATIONS   DNAR/DNI  MOST Completed, paper copy placed onto the chart electric copy can be found in [place]  DNR Form Completed, paper copy placed onto the chart electric copy can be found in [employer] helping with Advance Directives  Goal to optimize strength will then relocate to [facility]   Ongoing PMT support  Discussed with Dr. Rhona Leavens  Time In: 0830 Time Out: 0930 Time Spent: 60 Greater than 50% of the time was spent in counseling and coordination of care ______________________________________________________________________________________ Andrew [name] Andrew [name] Palliative  Medicine Team Team Cell Phone: [phone] Please utilize secure chat with additional questions, if there is no response within 30 minutes please call the above phone number  Palliative Medicine Team providers are available by phone from 7am to 7pm daily and can be reached through the team cell phone.   Should this patient require assistance outside of these hours, please call the patient's attending physician.

## 2021-01-09 DIAGNOSIS — I5032 Chronic diastolic (congestive) heart failure: Secondary | ICD-10-CM | POA: Diagnosis not present

## 2021-01-09 DIAGNOSIS — N179 Acute kidney failure, unspecified: Secondary | ICD-10-CM | POA: Diagnosis not present

## 2021-01-09 DIAGNOSIS — Z7189 Other specified counseling: Secondary | ICD-10-CM | POA: Diagnosis not present

## 2021-01-09 DIAGNOSIS — Z515 Encounter for palliative care: Secondary | ICD-10-CM | POA: Diagnosis not present

## 2021-01-09 DIAGNOSIS — J9601 Acute respiratory failure with hypoxia: Secondary | ICD-10-CM | POA: Diagnosis not present

## 2021-01-09 LAB — RENAL FUNCTION PANEL
Albumin: 2.4 g/dL — ABNORMAL LOW (ref 3.5–5.0)
Anion gap: 10 (ref 5–15)
BUN: 40 mg/dL — ABNORMAL HIGH (ref 8–23)
CO2: 27 mmol/L (ref 22–32)
Calcium: 8.2 mg/dL — ABNORMAL LOW (ref 8.9–10.3)
Chloride: 100 mmol/L (ref 98–111)
Creatinine, Ser: 3.4 mg/dL — ABNORMAL HIGH (ref 0.61–1.24)
GFR, Estimated: 17 mL/min — ABNORMAL LOW (ref >60)
Glucose, Bld: 112 mg/dL — ABNORMAL HIGH (ref 70–99)
Phosphorus: 3.5 mg/dL (ref 2.5–4.6)
Potassium: 3.9 mmol/L (ref 3.5–5.1)
Sodium: 137 mmol/L (ref 135–145)

## 2021-01-09 NOTE — Progress Notes (Signed)
  AKI, non-oliguric - Presumably due to ischemic ATN in setting of hypotension and volume depletion.  Started on HD 12/20/20.   - temp cath removed 01/02/21 - TDC placed with IR 01/04/21 - HD 01/04/21, 01/06/21; 01/08/21.   - doing 3 hr rx with minimal UF given AKI and hope for recovery Anemia - getting Aranesp 40 q Saturday, transfused with HD Urinary retention - acute on chronic s/p foley cath placed 12/29/20 Acute hypoxic respiratory failure - associated with CAP.   CAP - resolved with abx Hypotension - currently on midodrine Disposition - pt with significant debility and needs PT/OT.  Going to [facility] and has TTS OP HD chair.  Ok to go from renal perspective.  For d/c Monday.  Appreciate pall care assistance  Subjective:   For d/c tomorrow.  Was really worn out by dialysis yesterday.  Appreciate palliatve care c/s yesterday.    Objective:   BP 126/65 (BP Location: Left Arm)   Pulse (!) 52   Temp 99.4 F (37.4 C) (Oral)   Resp 14   Ht 5\' 8"  (1.727 m)   Wt 60.5 kg   SpO2 91%   BMI 20.28 kg/m   Physical Exam: Gen: NAD, lying in bed, appears tired CVS: RRR Resp: clear Abd: soft Ext: no LE edema ACCESS: R TDC c/d/i  Labs: BMET Recent Labs  Lab 01/04/21 0044 01/04/21 1055 01/05/21 0436 01/06/21 0302 01/07/21 0248 01/08/21 0527 01/09/21 0152  NA 138 140 137 139 139 138 137  K 3.6 3.9 3.5 3.8 3.7 3.3* 3.9  CL 102 104 96* 98 99 100 100  CO2 24 22 29 27 28 25 27   GLUCOSE 97 92 93 89 92 75 112*  BUN 66* 67* 25* 50* 37* 69* 40*  CREATININE 5.02* 5.17* 2.57* 4.05* 3.31* 4.65* 3.40*  CALCIUM 8.0* 8.7* 8.2* 8.1* 8.3* 8.2* 8.2*  PHOS 6.9* 6.8* 2.9 5.1* 4.6 5.5* 3.5   CBC Recent Labs  Lab 01/05/21 0436 01/06/21 0302 01/07/21 0248 01/08/21 0527  WBC 14.7* 9.2 13.5* 11.8*  HGB 8.3* 8.4* 8.1* 8.4*  HCT 25.6* 26.7* 26.0* 27.5*  MCV 95.5 97.8 97.4 98.9  PLT 171 159 152 173      Medications:     (feeding supplement) PROSource Plus  30 mL Oral TID BM   aspirin  81  mg Oral QPC supper   atorvastatin  20 mg Oral Daily   Chlorhexidine Gluconate Cloth  6 each Topical Daily   Chlorhexidine Gluconate Cloth  6 each Topical Q0600   darbepoetin (ARANESP) injection - DIALYSIS  40 mcg Intravenous Q Sat-HD   feeding supplement  1 Container Oral TID BM   ferrous sulfate  325 mg Oral Q breakfast   heparin injection (subcutaneous)  5,000 Units Subcutaneous Q8H   senna-docusate  1 tablet Oral BID     Madelon Lips MD 01/09/2021, 9:52 AM

## 2021-01-09 NOTE — Progress Notes (Signed)
PROGRESS NOTE    Andrew [name]  LFY:[id number] DOB: 07/05/1932 DOA: 12/10/2020 PCP: [name], MD    Brief Narrative:  87 y.o. male with a history of thoracic aortic aneurysm status post stent graft repair, AAA, CABG in 2010, chronic HFpEF, BPH with urinary retention, CKD II, and remote atrial fibrillation not anticoagulated who presented to the ED 5/20 prompted by PCP for abnormal blood work which was drawn in the setting of cough that hadn't improved with augmentin. He also had been losing appetite and feeling unwell.   Labs were notable for mild leukocytosis, worsening anemia, renal failure. CT the chest, abdomen, and pelvis is notable for new left upper lobe infiltrate, unchanged stent graft repair of thoracic aortic aneurysm, and grossly stable infrarenal AAA.  Patient was treated with rocephin, azithromycin, albuterol, and IV fluids in the ED. SBP nadir was in 70's shortly after admission. Creatinine was 8.71 worsening to 9.32 from previous of 1.09 in Aug 2021. Nephrology was consulted. Further goals of care discussions have continued. The patient and family now confirm their desire for trial of hemodialysis if there continues to be no renal recovery.  Nontunneled HD catheter was placed 5/30.  Nephrology providing intermittent dialysis.  Plans to convert nontunneled HD to tunneled HD anticipation for outpatient dialysis per nephrology.   Assessment & Plan:   Principal Problem:   AKI (acute kidney injury) ([place]) Active Problems:   Thoracic aortic aneurysm without rupture ([place])   Coronary artery disease   Urinary retention   AAA (abdominal aortic aneurysm) without rupture ([facility])   Essential hypertension   Normocytic anemia   Chronic diastolic CHF (congestive heart failure) ([place])   Community acquired pneumonia  Acute kidney injury, nonoliguric.  Suspect ATN -Although adequate urine output, renal function and uremia worsens without dialysis. - Nontunneled HD placed 5/30,  removed 6/12.  Now s/p tunneled HD cath placed 6/14 - Nephro following. Cont HD per Nephrology, currently on TTS schedule -Cr today 3.4 -Cont to follow bmet trends   Leukocytosis -stable -without evidence of acute infection at this time   Acute on chronic urinary retention - Foley catheter removed 6/11.  Straight in and out cath every 8 hours-he does it at home.  But continues to retain, difficult Foley placement.     Acute hypoxic respiratory failure: Due to CAP, possible aspiration, possible renal failure-related volume overload. -Resolved.  Recently completed antibiotics.  For now continue as needed bronchodilators.  I-S/flutter. Continue with aspiration precautions   Left upper lobe community-acquired pneumonia - Completed 5 days of Rocephin and azithromycin, stable   Hypotension, intermittent -Elevated blood pressure at her follow-up change midodrine 5 mg 3 times daily to as needed - Random cortisol and TSH-normal -BP remains stable   CAD: History of PCI and CABG 2010. No anginal complaints currently.  Intermittent asymptomatic bradycardia/PAC -Continued on aspirin and statin - Follows outpatient cardiology, Dr. Debara Pickett   Chronic HFpEF: Echo 5/26 unchanged from prior with LVEF 60-65%, unevaluated diastolic function, no RWMAs. Normal RV, no severe valvular disease. -HD per nephrology   Thoracic aortic aneurysm s/p repair: -Stable   AAA: Appears stable on CT in ED  - VVS appointment with repeat U/S on 6/21 with Dr. Carlis Abbott.   Iron deficiency anemia: No acute bleeding noted. -Iron supplements with bowel regimen. Hb 7.4 - Status post 1 unit PRBC 6/11.  Hgb has since remained stable   Weakness -Anticipating SNF at time of d/c    DVT prophylaxis: Heparin subq Code Status: Full  Family Communication: Pt in room, family currently at bedside  Status is: Inpatient  Remains inpatient appropriate because:Inpatient level of care appropriate due to severity of illness  Dispo:  The patient is from: Home              Anticipated d/c is to: SNF              Patient currently is not medically stable to d/c.   Difficult to place patient No    Consultants:  Nephrology IR Palliative Care  Procedures:    Antimicrobials: Anti-infectives (From admission, onward)    Start     Dose/Rate Route Frequency Ordered Stop   01/04/21 1130  ceFAZolin (ANCEF) IVPB 2g/100 mL premix        2 g 200 mL/hr over 30 Minutes Intravenous To Radiology 01/04/21 1031 01/04/21 1632   12/11/20 2000  cefTRIAXone (ROCEPHIN) 2 g in sodium chloride 0.9 % 100 mL IVPB        2 g 200 mL/hr over 30 Minutes Intravenous Every 24 hours 12/10/20 2308 12/14/20 2321   12/11/20 2000  azithromycin (ZITHROMAX) 500 mg in sodium chloride 0.9 % 250 mL IVPB        500 mg 250 mL/hr over 60 Minutes Intravenous Every 24 hours 12/10/20 2308 12/14/20 2133   12/10/20 1945  cefTRIAXone (ROCEPHIN) 1 g in sodium chloride 0.9 % 100 mL IVPB        1 g 200 mL/hr over 30 Minutes Intravenous  Once 12/10/20 1940 12/10/20 2037   12/10/20 1945  azithromycin (ZITHROMAX) 500 mg in sodium chloride 0.9 % 250 mL IVPB        500 mg 250 mL/hr over 60 Minutes Intravenous  Once 12/10/20 1940 12/10/20 2142       Subjective: Feels well today. Without complaints  Objective: Vitals:   01/08/21 2111 01/09/21 0500 01/09/21 0518 01/09/21 1427  BP: (!) 138/55  126/65 (!) 146/66  Pulse: (!) 58  (!) 52 (!) 59  Resp: 16  14 18   Temp: 98.3 F (36.8 C)  99.4 F (37.4 C) 98.4 F (36.9 C)  TempSrc: Oral  Oral   SpO2: 99%  91% 99%  Weight:  60.5 kg    Height:        Intake/Output Summary (Last 24 hours) at 01/09/2021 1516 Last data filed at 01/09/2021 0500 Gross per 24 hour  Intake --  Output 300 ml  Net -300 ml    Filed Weights   01/08/21 0900 01/08/21 1210 01/09/21 0500  Weight: 58.5 kg 58.5 kg 60.5 kg    Examination: General exam: Awake, laying in bed, in nad Respiratory system: Normal respiratory effort, no  wheezing Cardiovascular system: regular rate, s1, s2 Gastrointestinal system: Soft, nondistended, positive BS Central nervous system: CN2-12 grossly intact, strength intact Extremities: Perfused, no clubbing Skin: Normal skin turgor, no notable skin lesions seen Psychiatry: Mood normal // no visual hallucinations   Data Reviewed: I have personally reviewed following labs and imaging studies  CBC: Recent Labs  Lab 01/04/21 1055 01/05/21 0436 01/06/21 0302 01/07/21 0248 01/08/21 0527  WBC 11.8* 14.7* 9.2 13.5* 11.8*  HGB 9.0* 8.3* 8.4* 8.1* 8.4*  HCT 29.5* 25.6* 26.7* 26.0* 27.5*  MCV 98.0 95.5 97.8 97.4 98.9  PLT 216 171 159 152 371    Basic Metabolic Panel: Recent Labs  Lab 01/03/21 0434 01/04/21 0044 01/05/21 0436 01/06/21 0302 01/07/21 0248 01/08/21 0527 01/09/21 0152  NA 140   < > 137 139 139 138 137  K 4.0   < > 3.5 3.8 3.7 3.3* 3.9  CL 103   < > 96* 98 99 100 100  CO2 26   < > 29 27 28 25 27   GLUCOSE 83   < > 93 89 92 75 112*  BUN 55*   < > 25* 50* 37* 69* 40*  CREATININE 4.39*   < > 2.57* 4.05* 3.31* 4.65* 3.40*  CALCIUM 8.5*   < > 8.2* 8.1* 8.3* 8.2* 8.2*  MG 1.9  --   --   --   --   --   --   PHOS 6.2*   < > 2.9 5.1* 4.6 5.5* 3.5   < > = values in this interval not displayed.    GFR: Estimated Creatinine Clearance: 13.3 mL/min (A) (by C-G formula based on SCr of 3.4 mg/dL (H)). Liver Function Tests: Recent Labs  Lab 01/05/21 0436 01/06/21 0302 01/07/21 0248 01/08/21 0527 01/09/21 0152  ALBUMIN 2.6* 2.5* 2.4* 2.6* 2.4*    No results for input(s): LIPASE, AMYLASE in the last 168 hours. No results for input(s): AMMONIA in the last 168 hours. Coagulation Profile: No results for input(s): INR, PROTIME in the last 168 hours. Cardiac Enzymes: No results for input(s): CKTOTAL, CKMB, CKMBINDEX, TROPONINI in the last 168 hours. BNP (last 3 results) No results for input(s): PROBNP in the last 8760 hours.  HbA1C: No results for input(s): HGBA1C in  the last 72 hours. CBG: Recent Labs  Lab 01/06/21 2056  GLUCAP 114*    Lipid Profile: No results for input(s): CHOL, HDL, LDLCALC, TRIG, CHOLHDL, LDLDIRECT in the last 72 hours. Thyroid Function Tests: No results for input(s): TSH, T4TOTAL, FREET4, T3FREE, THYROIDAB in the last 72 hours. Anemia Panel: No results for input(s): VITAMINB12, FOLATE, FERRITIN, TIBC, IRON, RETICCTPCT in the last 72 hours. Sepsis Labs: No results for input(s): PROCALCITON, LATICACIDVEN in the last 168 hours.  Recent Results (from the past 240 hour(s))  MRSA Next Gen by PCR, Nasal     Status: None   Collection Time: 01/01/21 10:17 PM  Result Value Ref Range Status   MRSA by PCR Next Gen NOT DETECTED NOT DETECTED Final    Comment: (NOTE) The GeneXpert MRSA Assay (FDA approved for NASAL specimens only), is one component of a comprehensive MRSA colonization surveillance program. It is not intended to diagnose MRSA infection nor to guide or monitor treatment for MRSA infections. Test performance is not FDA approved in patients less than 30 years old. Performed at [facility], [place] [address], [place], [place] [address]       Radiology Studies: No results found.  Scheduled Meds:  (feeding supplement) PROSource Plus  30 mL Oral TID BM   aspirin  81 mg Oral QPC supper   atorvastatin  20 mg Oral Daily   Chlorhexidine Gluconate Cloth  6 each Topical Daily   Chlorhexidine Gluconate Cloth  6 each Topical Q0600   darbepoetin (ARANESP) injection - DIALYSIS  40 mcg Intravenous Q Sat-HD   feeding supplement  1 Container Oral TID BM   ferrous sulfate  325 mg Oral Q breakfast   heparin injection (subcutaneous)  5,000 Units Subcutaneous Q8H   senna-docusate  1 tablet Oral BID   Continuous Infusions:  sodium chloride 10 mL/hr at 01/01/21 1026   sodium chloride     sodium chloride       LOS: 30 days   Marylu Lund, MD [facility] Pager On Amion  If 7PM-7AM, please contact  night-coverage 01/09/2021, 3:16 PM

## 2021-01-09 NOTE — Plan of Care (Signed)
  Problem: Pain Managment: Goal: General experience of comfort will improve Outcome: Progressing   Problem: Safety: Goal: Ability to remain free from injury will improve Outcome: Progressing   Problem: Respiratory: Goal: Respiratory symptoms related to disease process will be avoided Outcome: Progressing

## 2021-01-09 NOTE — Progress Notes (Signed)
   Palliative Medicine Inpatient Follow Up Note  REASON FOR CONSULTATION:Establishing goals of care   Palliative Care consult requested for goals of care discussion in this 88 y.o. male with a medical history significant for CABG (2010) thoracic aortic aneurysm s/p stent graft repair, diastolic CHF, BPH, urinary retention (self-catheterization 3 times per day), CKD stage II, atrial fibrillation (not anticoagulated), pretension, CAD, and AAA.  Patient presented to the ED from home with complaints of productive cough and abnormal lab work.  On admission patient reported productive cough x2 weeks with no improvement despite Augmentin ordered by his PCP.  During work-up BUN 161, creatinine 8.71, bicarb 17, CT of chest, abdomen, pelvis showed new left upper lobe infiltrate.  Patient receiving IV antibiotics.  Currently followed by nephrology.  Today's Discussion (01/09/2021):  *Please note that this is a verbal dictation therefore any spelling or grammatical errors are due to the "Dragon Medical One" system interpretation.  I met at bedside with Andrew [name] and his wife, [name] this morning. We reviewed the advance directives and the need to get the notarized. Adrea Hollins was able to come to bedside with two unrelated witnesses. We were able to complete this process this morning without complication.   Questions and concerns addressed   Objective Assessment: Vital Signs Vitals:   01/08/21 2111 01/09/21 0518  BP: (!) 138/55 126/65  Pulse: (!) 58 (!) 52  Resp: 16 14  Temp: 98.3 F (36.8 C) 99.4 F (37.4 C)  SpO2: 99% 91%    Intake/Output Summary (Last 24 hours) at 01/09/2021 1001 Last data filed at 01/09/2021 0500 Gross per 24 hour  Intake --  Output 0 ml  Net 0 ml    Last Weight  Most recent update: 01/09/2021  5:56 AM    Weight  60.5 kg (133 lb 6.1 oz)            Gen:  Frail Elderly Caucasian M in NAD HEENT: Dry mucous membranes CV: Irregular rate PULM: clear to auscultation  bilaterally  ABD: soft/nontender  EXT: No edema  Neuro: Alert and oriented x3   SUMMARY OF RECOMMENDATIONS   DNAR/DNI  MOST Completed, paper copy placed onto the chart electric copy can be found in [employer]  DNR Form Completed, paper copy placed onto the chart electric copy can be found in [employer]  Advance Directives - Completed and notarized  Goal to optimize strength will then relocate to [facility]   Ongoing incremental PMT support  Time Spent: 35 Greater than 50% of the time was spent in counseling and coordination of care ______________________________________________________________________________________ Michelle Ferolito [place] Palliative Medicine Team Team Cell Phone: [phone] Please utilize secure chat with additional questions, if there is no response within 30 minutes please call the above phone number  Palliative Medicine Team providers are available by phone from 7am to 7pm daily and can be reached through the team cell phone.  Should this patient require assistance outside of these hours, please call the patient's attending physician.     

## 2021-01-10 DIAGNOSIS — N179 Acute kidney failure, unspecified: Secondary | ICD-10-CM | POA: Diagnosis not present

## 2021-01-10 DIAGNOSIS — I5032 Chronic diastolic (congestive) heart failure: Secondary | ICD-10-CM | POA: Diagnosis not present

## 2021-01-10 DIAGNOSIS — Z515 Encounter for palliative care: Secondary | ICD-10-CM | POA: Diagnosis not present

## 2021-01-10 DIAGNOSIS — J9601 Acute respiratory failure with hypoxia: Secondary | ICD-10-CM | POA: Diagnosis not present

## 2021-01-10 DIAGNOSIS — Z7189 Other specified counseling: Secondary | ICD-10-CM | POA: Diagnosis not present

## 2021-01-10 DIAGNOSIS — Z66 Do not resuscitate: Secondary | ICD-10-CM | POA: Diagnosis not present

## 2021-01-10 LAB — RENAL FUNCTION PANEL
Albumin: 2.4 g/dL — ABNORMAL LOW (ref 3.5–5.0)
Anion gap: 10 (ref 5–15)
BUN: 66 mg/dL — ABNORMAL HIGH (ref 8–23)
CO2: 25 mmol/L (ref 22–32)
Calcium: 8.1 mg/dL — ABNORMAL LOW (ref 8.9–10.3)
Chloride: 103 mmol/L (ref 98–111)
Creatinine, Ser: 4.79 mg/dL — ABNORMAL HIGH (ref 0.61–1.24)
GFR, Estimated: 11 mL/min — ABNORMAL LOW (ref >60)
Glucose, Bld: 90 mg/dL (ref 70–99)
Phosphorus: 5.2 mg/dL — ABNORMAL HIGH (ref 2.5–4.6)
Potassium: 3.6 mmol/L (ref 3.5–5.1)
Sodium: 138 mmol/L (ref 135–145)

## 2021-01-10 LAB — RESP PANEL BY RT-PCR (FLU A&B, COVID) ARPGX2
Influenza A by PCR: NEGATIVE
Influenza B by PCR: NEGATIVE
SARS Coronavirus 2 by RT PCR: NEGATIVE

## 2021-01-10 MED ORDER — MIDODRINE HCL 5 MG PO TABS: 5.0000 mg | ORAL_TABLET | Freq: Three times a day (TID) | ORAL | 0 refills | Status: DC | PRN

## 2021-01-10 MED ORDER — POLYETHYLENE GLYCOL 3350 17 G PO PACK: 17.0000 g | PACK | Freq: Every day | ORAL | 0 refills | Status: AC | PRN

## 2021-01-10 NOTE — Progress Notes (Signed)
Report given to [facility] at [facility].

## 2021-01-10 NOTE — Progress Notes (Signed)
RN attempted report to [place]. Will call again.

## 2021-01-10 NOTE — Progress Notes (Signed)
   Palliative Medicine Inpatient Follow Up Note  REASON FOR CONSULTATION:Establishing goals of care   Palliative Care consult requested for goals of care discussion in this 87 y.o. male with a medical history significant for CABG (2010) thoracic aortic aneurysm s/p stent graft repair, diastolic CHF, BPH, urinary retention (self-catheterization 3 times per day), CKD stage II, atrial fibrillation (not anticoagulated), pretension, CAD, and AAA.  Patient presented to the ED from home with complaints of productive cough and abnormal lab work.  On admission patient reported productive cough x2 weeks with no improvement despite Augmentin ordered by his PCP.  During work-up BUN 161, creatinine 8.71, bicarb 17, CT of chest, abdomen, pelvis showed new left upper lobe infiltrate.  Patient receiving IV antibiotics.  Currently followed by nephrology.  Today's Discussion (01/10/2021):  *Please note that this is a verbal dictation therefore any spelling or grammatical errors are due to the "[employer]" system interpretation.  I met at bedside with Andrew [name] and his wife, Andrew [name] this morning.   We reviewed the plan for discharge to a skilled nursing facility though having the hold up of now needing another PT/OT note.  Reviewed prior advance care documents.   Questions and concerns addressed   Objective Assessment: Vital Signs Vitals:   01/09/21 2022 01/10/21 0503  BP: (!) 136/56 (!) 158/67  Pulse: 78 65  Resp: 18 20  Temp: 98.1 F (36.7 C) 97.8 F (36.6 C)  SpO2: 98% 93%    Intake/Output Summary (Last 24 hours) at 01/10/2021 1224 Last data filed at 01/09/2021 2022 Gross per 24 hour  Intake --  Output 300 ml  Net -300 ml    Last Weight  Most recent update: 01/09/2021  5:56 AM    Weight  60.5 kg (133 lb 6.1 oz)            Gen:  Frail Elderly Caucasian M in NAD HEENT: Dry mucous membranes CV: Irregular rate PULM: clear to auscultation bilaterally  ABD: soft/nontender  EXT: No edema   Neuro: Alert and oriented x3   SUMMARY OF RECOMMENDATIONS   DNAR/DNI  MOST Completed, paper copy placed onto the chart electric copy can be found in [employer]  DNR Form Completed, paper copy placed onto the chart electric copy can be found in [place] - Completed and notarized  Goal to optimize strength will then relocate to [facility]   Ongoing incremental PMT support  Time Spent: 15 Greater than 50% of the time was spent in counseling and coordination of care ______________________________________________________________________________________ [place] Team Team Cell Phone: [phone] Please utilize secure chat with additional questions, if there is no response within 30 minutes please call the above phone number  Palliative Medicine Team providers are available by phone from 7am to 7pm daily and can be reached through the team cell phone.  Should this patient require assistance outside of these hours, please call the patient's attending physician.

## 2021-01-10 NOTE — Plan of Care (Signed)
  Problem: Nutrition: Goal: Adequate nutrition will be maintained Outcome: Progressing   Problem: Pain Managment: Goal: General experience of comfort will improve Outcome: Progressing   Problem: Urinary Elimination: Goal: Progression of disease will be identified and treated Outcome: Progressing

## 2021-01-10 NOTE — Progress Notes (Signed)
[facility] ROUNDING NOTE   Subjective:   Interval History: This is an 87 year old gentleman with a history of thoracic aortic aneurysm status post stent graft repair, CABG 7026, diastolic heart failure, history of BPH with urinary retention, history of atrial fibrillation.   Patient with significant disability has been dialysis dependent.  Plan is for nursing home placement and continued outpatient dialysis on Tuesday Thursday Saturday schedule.  Blood pressure 138/67 pulse 65 temperature 97.8 O2 sats 93% 2 L nasal cannula  Sodium 138 potassium 3.6 chloride 103 CO2 25 BUN 36 creatinine 4.79 glucose 90 calcium 8.1 phosphorus 5.2 albumin 2.4 hemoglobin 8.4  Objective:  Vital signs in last 24 hours:  Temp:  [97.8 F (36.6 C)-98.4 F (36.9 C)] 97.8 F (36.6 C) (06/20 0503) Pulse Rate:  [59-78] 65 (06/20 0503) Resp:  [18-20] 20 (06/20 0503) BP: (136-158)/(56-67) 158/67 (06/20 0503) SpO2:  [93 %-99 %] 93 % (06/20 0503)  Weight change:  Filed Weights   01/08/21 0900 01/08/21 1210 01/09/21 0500  Weight: 58.5 kg 58.5 kg 60.5 kg    Intake/Output: No intake/output data recorded.   Intake/Output this shift:  Total I/O In: -  Out: 300 [Urine:300]   Gen: NAD, lying in bed, appears tired CVS: RRR Resp: clear Abd: soft Ext: no LE edema ACCESS: R [facility] c/d/i  Basic Metabolic Panel: Recent Labs  Lab 01/06/21 0302 01/07/21 0248 01/08/21 0527 01/09/21 0152 01/10/21 0440  NA 139 139 138 137 138  K 3.8 3.7 3.3* 3.9 3.6  CL 98 99 100 100 103  CO2 27 28 25 27 25   GLUCOSE 89 92 75 112* 90  BUN 50* 37* 69* 40* 66*  CREATININE 4.05* 3.31* 4.65* 3.40* 4.79*  CALCIUM 8.1* 8.3* 8.2* 8.2* 8.1*  PHOS 5.1* 4.6 5.5* 3.5 5.2*    Liver Function Tests: Recent Labs  Lab 01/06/21 0302 01/07/21 0248 01/08/21 0527 01/09/21 0152 01/10/21 0440  ALBUMIN 2.5* 2.4* 2.6* 2.4* 2.4*   No results for input(s): LIPASE, AMYLASE in the last 168 hours. No results for input(s):  AMMONIA in the last 168 hours.  CBC: Recent Labs  Lab 01/04/21 1055 01/05/21 0436 01/06/21 0302 01/07/21 0248 01/08/21 0527  WBC 11.8* 14.7* 9.2 13.5* 11.8*  HGB 9.0* 8.3* 8.4* 8.1* 8.4*  HCT 29.5* 25.6* 26.7* 26.0* 27.5*  MCV 98.0 95.5 97.8 97.4 98.9  PLT 216 171 159 152 173    Cardiac Enzymes: No results for input(s): CKTOTAL, CKMB, CKMBINDEX, TROPONINI in the last 168 hours.  BNP: Invalid input(s): POCBNP  CBG: Recent Labs  Lab 01/06/21 2056  GLUCAP 114*    Microbiology: Results for orders placed or performed during the hospital encounter of 12/10/20  Resp Panel by RT-PCR (Flu A&B, Covid) Nasopharyngeal Swab     Status: None   Collection Time: 12/10/20  5:13 PM   Specimen: Nasopharyngeal Swab; Nasopharyngeal(NP) swabs in vial transport medium  Result Value Ref Range Status   SARS Coronavirus 2 by RT PCR NEGATIVE NEGATIVE Final    Comment: (NOTE) SARS-CoV-2 target nucleic acids are NOT DETECTED.  The SARS-CoV-2 RNA is generally detectable in upper respiratory specimens during the acute phase of infection. The lowest concentration of SARS-CoV-2 viral copies this assay can detect is 138 copies/mL. A negative result does not preclude SARS-Cov-2 infection and should not be used as the sole basis for treatment or other patient management decisions. A negative result may occur with  improper specimen collection/handling, submission of specimen other than nasopharyngeal swab, presence of viral mutation(s)  within the areas targeted by this assay, and inadequate number of viral copies(<138 copies/mL). A negative result must be combined with clinical observations, patient history, and epidemiological information. The expected result is Negative.  Fact Sheet for Patients:  EntrepreneurPulse.com.au  Fact Sheet for Healthcare Providers:  IncredibleEmployment.be  This test is no t yet approved or cleared by the [place] FDA and   has been authorized for detection and/or diagnosis of SARS-CoV-2 by FDA under an Emergency Use Authorization (EUA). This EUA will remain  in effect (meaning this test can be used) for the duration of the COVID-19 declaration under Section 564(b)(1) of the Act, 21 U.S.C.section 360bbb-3(b)(1), unless the authorization is terminated  or revoked sooner.       Influenza A by PCR NEGATIVE NEGATIVE Final   Influenza B by PCR NEGATIVE NEGATIVE Final    Comment: (NOTE) The Xpert Xpress SARS-CoV-2/FLU/RSV plus assay is intended as an aid in the diagnosis of influenza from Nasopharyngeal swab specimens and should not be used as a sole basis for treatment. Nasal washings and aspirates are unacceptable for Xpert Xpress SARS-CoV-2/FLU/RSV testing.  Fact Sheet for Patients: EntrepreneurPulse.com.au  Fact Sheet for Healthcare Providers: IncredibleEmployment.be  This test is not yet approved or cleared by the [place] FDA and has been authorized for detection and/or diagnosis of SARS-CoV-2 by FDA under an Emergency Use Authorization (EUA). This EUA will remain in effect (meaning this test can be used) for the duration of the COVID-19 declaration under Section 564(b)(1) of the Act, 21 U.S.C. section 360bbb-3(b)(1), unless the authorization is terminated or revoked.  Performed at [facility], [address] [place], [place] [address]   MRSA Next Gen by PCR, Nasal     Status: None   Collection Time: 01/01/21 10:17 PM  Result Value Ref Range Status   MRSA by PCR Next Gen NOT DETECTED NOT DETECTED Final    Comment: (NOTE) The GeneXpert MRSA Assay (FDA approved for NASAL specimens only), is one component of a comprehensive MRSA colonization surveillance program. It is not intended to diagnose MRSA infection nor to guide or monitor treatment for MRSA infections. Test performance is not FDA approved in patients less than 39  years old. Performed at [facility], [place] [address], [place],  [address]     Coagulation Studies: No results for input(s): LABPROT, INR in the last 72 hours.  Urinalysis: No results for input(s): COLORURINE, LABSPEC, PHURINE, GLUCOSEU, HGBUR, BILIRUBINUR, KETONESUR, PROTEINUR, UROBILINOGEN, NITRITE, LEUKOCYTESUR in the last 72 hours.  Invalid input(s): APPERANCEUR    Imaging: No results found.   Medications:    sodium chloride 10 mL/hr at 01/01/21 1026   sodium chloride     sodium chloride      (feeding supplement) PROSource Plus  30 mL Oral TID BM   aspirin  81 mg Oral QPC supper   atorvastatin  20 mg Oral Daily   Chlorhexidine Gluconate Cloth  6 each Topical Daily   Chlorhexidine Gluconate Cloth  6 each Topical Q0600   darbepoetin (ARANESP) injection - DIALYSIS  40 mcg Intravenous Q Sat-HD   feeding supplement  1 Container Oral TID BM   ferrous sulfate  325 mg Oral Q breakfast   heparin injection (subcutaneous)  5,000 Units Subcutaneous Q8H   senna-docusate  1 tablet Oral BID   sodium chloride, sodium chloride, acetaminophen **OR** acetaminophen, albuterol, alteplase, heparin, heparin sodium (porcine), lidocaine (PF), lidocaine-prilocaine, midodrine, ondansetron **OR** ondansetron (ZOFRAN) IV, pentafluoroprop-tetrafluoroeth, phenol, polyethylene glycol, sodium chloride  Assessment/  Plan:  Patient initiated on dialysis 12/20/2020 for not failure to recover from acute kidney injury.  I was dialysis dependent.  He is to be discharged to nursing home with outpatient dialysis treatments on Tuesday Thursday Saturday dialysis schedule.  Baseline creatinine about 1 mg/dL August 2021.  Not overly optimistic about recovery.  However patient is making some urine. Anemia status post transfusion Aranesp 40 mcg weekly History of urinary retention status post Foley catheter 12/29/2020 History of hypertension currently receiving midodrine for support. Community-acquired  pneumonia appears to resolved.   LOS: [place] @TODAY @6 :45 AM

## 2021-01-10 NOTE — Discharge Summary (Signed)
Physician Discharge Summary  Andrew [name]:[id number] DOB: [birth year] DOA: 12/10/2020  PCP: [name], MD  Admit date: 12/10/2020 Discharge date: 01/10/2021  Admitted From: Home Disposition:  SNF  Recommendations for Outpatient Follow-up:  Follow up with PCP in 1-2 weeks Follow up with scheduled dialysis  COVID Negative  Discharge Condition:Stable CODE STATUS:DNR Diet recommendation: Renal   Brief/Interim Summary: 87 y.o. male with a history of thoracic aortic aneurysm status post stent graft repair, AAA, CABG in 2010, chronic HFpEF, BPH with urinary retention, CKD II, and remote atrial fibrillation not anticoagulated who presented to the ED 5/20 prompted by PCP for abnormal blood work which was drawn in the setting of cough that hadn't improved with augmentin. He also had been losing appetite and feeling unwell.   Labs were notable for mild leukocytosis, worsening anemia, renal failure. CT the chest, abdomen, and pelvis is notable for new left upper lobe infiltrate, unchanged stent graft repair of thoracic aortic aneurysm, and grossly stable infrarenal AAA.  Patient was treated with rocephin, azithromycin, albuterol, and IV fluids in the ED. SBP nadir was in 70's shortly after admission. Creatinine was 8.71 worsening to 9.32 from previous of 1.09 in Aug 2021. Nephrology was consulted. Further goals of care discussions have continued. The patient and family now confirm their desire for trial of hemodialysis if there continues to be no renal recovery. Pt has been arranged for outpatient HD  Discharge Diagnoses:  Principal Problem:   AKI (acute kidney injury) ([place]) Active Problems:   Thoracic aortic aneurysm without rupture ([place])   Coronary artery disease   Urinary retention   AAA (abdominal aortic aneurysm) without rupture ([facility])   Essential hypertension   Normocytic anemia   Chronic diastolic CHF (congestive heart failure) ([place])   Community acquired pneumonia  Acute  kidney injury, nonoliguric.  Suspect ATN -Although adequate urine output, renal function and uremia worsens without dialysis. - Nontunneled HD placed 5/30, removed 6/12.  Now s/p tunneled HD cath placed 6/14 - Nephro following. Cont HD per Nephrology, currently on TTS schedule -Patient to continue HD as outpatient   Leukocytosis -remained stable -without evidence of acute infection at this time   Acute on chronic urinary retention - Foley catheter removed 6/11.  Straight in and out cath every 8 hours-he does it at home.  But continued to retain, remained with foley cath   Acute hypoxic respiratory failure: Due to CAP, possible aspiration, possible renal failure-related volume overload. -Resolved.  Recently completed antibiotics.  For now continue as needed bronchodilators.  I-S/flutter.   Left upper lobe community-acquired pneumonia - Completed 5 days of Rocephin and azithromycin, stable   Hypotension, intermittent -Elevated blood pressure at her follow-up change midodrine 5 mg 3 times daily to as needed - Random cortisol and TSH-normal -BP remains stable   CAD: History of PCI and CABG 2010. No anginal complaints currently.  Intermittent asymptomatic bradycardia/PAC -Continued on aspirin and statin - Follows outpatient cardiology, Dr. Debara Pickett   Chronic HFpEF: Echo 5/26 unchanged from prior with LVEF 60-65%, unevaluated diastolic function, no RWMAs. Normal RV, no severe valvular disease. -HD per nephrology   Thoracic aortic aneurysm s/p repair: -Stable   AAA: Appears stable on CT in ED  - VVS appointment with repeat U/S on 6/21 with Dr. Carlis Abbott. Would likely need rescheduled appointment   Iron deficiency anemia: No acute bleeding noted. -Iron supplements with bowel regimen. Hb 7.4 - Status post 1 unit PRBC 6/11.  Hgb has since remained stable   Weakness -  Plan d/c SNF today   Discharge Instructions   Allergies as of 01/10/2021       Reactions   Quinolones Other (See  Comments)   Patient has an abdominal aortic aneurysm    Naltrexone    Other reaction(s): GI Upset (intolerance), NVD        Medication List     STOP taking these medications    diltiazem 120 MG 24 hr capsule Commonly known as: DILACOR XR   furosemide 80 MG tablet Commonly known as: LASIX   gabapentin 300 MG capsule Commonly known as: NEURONTIN   ibuprofen 200 MG tablet Commonly known as: ADVIL   isosorbide mononitrate 30 MG 24 hr tablet Commonly known as: IMDUR   nitroGLYCERIN 0.2 mg/hr patch Commonly known as: NITRODUR - Dosed in mg/24 hr   nitroGLYCERIN 0.6 MG SL tablet Commonly known as: NITROSTAT   Potassium Chloride ER 20 MEQ Tbcr       TAKE these medications    aspirin 81 MG tablet Take 81 mg by mouth daily after supper.   atorvastatin 20 MG tablet Commonly known as: LIPITOR TAKE 1 TABLET ONCE DAILY AT 6 PM. What changed: See the new instructions.   Co-Enzyme Q-10 100 MG Caps Take 100 mg by mouth daily.   CRANBERRY EXTRACT PO Take 1 tablet by mouth daily.   ferrous sulfate 325 (65 FE) MG tablet Take 325 mg by mouth daily with breakfast.   loratadine 10 MG tablet Commonly known as: CLARITIN Take 10 mg by mouth daily as needed for allergies or rhinitis.   midodrine 5 MG tablet Commonly known as: PROAMATINE Take 1 tablet (5 mg total) by mouth 3 (three) times daily as needed (SBP <120).   Nasacort Allergy 24HR 55 MCG/ACT Aero nasal inhaler Generic drug: triamcinolone Place 2 sprays into the nose daily as needed (for allergies).   oxymetazoline 0.05 % nasal spray Commonly known as: AFRIN Place 1 spray into both nostrils at bedtime as needed for congestion.   polyethylene glycol 17 g packet Commonly known as: MIRALAX / GLYCOLAX Take 17 g by mouth daily as needed for moderate constipation.   PRESERVISION AREDS 2 PO Take 1 capsule by mouth 2 (two) times daily.   sodium chloride 5 % ophthalmic ointment Commonly known as: MURO 373 Place 1  application into both eyes at bedtime.   TURMERIC PO Take 1 capsule by mouth daily after breakfast.   vitamin B-12 1000 MCG tablet Commonly known as: CYANOCOBALAMIN Take 1,000 mcg by mouth daily.   vitamin C 250 MG tablet Commonly known as: ASCORBIC ACID Take 250 mg by mouth daily.   Vitamin D3 25 MCG (1000 UT) Caps Take 1,000 Units by mouth daily.        Allergies  Allergen Reactions   Quinolones Other (See Comments)    Patient has an abdominal aortic aneurysm    Naltrexone     Other reaction(s): GI Upset (intolerance), NVD    Consultations: Nephrology Palliative Care IR  Procedures/Studies: IR Fluoro Guide CV Line Left  Result Date: 12/20/2020 INDICATION: 87 year old male with history of worsening azotemia. EXAM: NON-TUNNELED CENTRAL VENOUS HEMODIALYSIS CATHETER PLACEMENT WITH ULTRASOUND AND FLUOROSCOPIC GUIDANCE COMPARISON:  None. MEDICATIONS: None FLUOROSCOPY TIME:  minutes,   seconds (  mGy) COMPLICATIONS: None immediate. PROCEDURE: Informed written consent was obtained from the patient after a discussion of the risks, benefits, and alternatives to treatment. Questions regarding the procedure were encouraged and answered. Preprocedure ultrasound evaluation demonstrated occlusion and possible variant anatomy of  the right internal jugular vein, without safe access point for central line insertion. Therefore, the left neck and chest were prepped with chlorhexidine in a sterile fashion, and a sterile drape was applied covering the operative field. Maximum barrier sterile technique with sterile gowns and gloves were used for the procedure. A timeout was performed prior to the initiation of the procedure. After the overlying soft tissues were anesthetized, a small venotomy incision was created and a micropuncture kit was utilized to access the internal jugular vein. Real-time ultrasound guidance was utilized for vascular access including the acquisition of a permanent ultrasound  image documenting patency of the accessed vessel. The microwire was utilized to measure appropriate catheter length. A guidewire was advanced to the level of the IVC. Under fluoroscopic guidance, the venotomy was serially dilated, ultimately allowing placement of a 24 cm temporary Trialysis catheter with tip ultimately terminating within the superior aspect of the right atrium. Final catheter positioning was confirmed and documented with a spot radiographic image. The catheter aspirates and flushes normally. The catheter was flushed with appropriate volume heparin dwells. The catheter exit site was secured with a 0 silk retention suture. A dressing was placed. The patient tolerated the procedure well without immediate post procedural complication. IMPRESSION: Successful placement of a left internal jugular approach 24 cm temporary dialysis catheter with tip terminating with in the superior aspect of the right atrium. The catheter is ready for immediate use. PLAN: This catheter may be converted to a tunneled dialysis catheter at a later date as indicated. [name], MD [facility] Radiology Electronically Signed   By: [name] MD   On: 12/20/2020 11:11   IR US Guide Vasc Access Left  Result Date: 12/20/2020 INDICATION: 87 year old male with history of worsening azotemia. EXAM: NON-TUNNELED CENTRAL VENOUS HEMODIALYSIS CATHETER PLACEMENT WITH ULTRASOUND AND FLUOROSCOPIC GUIDANCE COMPARISON:  None. MEDICATIONS: None FLUOROSCOPY TIME:  minutes,   seconds (  mGy) COMPLICATIONS: None immediate. PROCEDURE: Informed written consent was obtained from the patient after a discussion of the risks, benefits, and alternatives to treatment. Questions regarding the procedure were encouraged and answered. Preprocedure ultrasound evaluation demonstrated occlusion and possible variant anatomy of the right internal jugular vein, without safe access point for central line  insertion. Therefore, the left neck and chest were prepped with chlorhexidine in a sterile fashion, and a sterile drape was applied covering the operative field. Maximum barrier sterile technique with sterile gowns and gloves were used for the procedure. A timeout was performed prior to the initiation of the procedure. After the overlying soft tissues were anesthetized, a small venotomy incision was created and a micropuncture kit was utilized to access the internal jugular vein. Real-time ultrasound guidance was utilized for vascular access including the acquisition of a permanent ultrasound image documenting patency of the accessed vessel. The microwire was utilized to measure appropriate catheter length. A guidewire was advanced to the level of the IVC. Under fluoroscopic guidance, the venotomy was serially dilated, ultimately allowing placement of a 24 cm temporary Trialysis catheter with tip ultimately terminating within the superior aspect of the right atrium. Final catheter positioning was confirmed and documented with a spot radiographic image. The catheter aspirates and flushes normally. The catheter was flushed with appropriate volume heparin dwells. The catheter exit site was secured with a 0 silk retention suture. A dressing was placed. The patient tolerated the procedure well without immediate post procedural complication. IMPRESSION: Successful placement of a left internal jugular approach 24 cm temporary  dialysis catheter with tip terminating with in the superior aspect of the right atrium. The catheter is ready for immediate use. PLAN: This catheter may be converted to a tunneled dialysis catheter at a later date as indicated. [name], MD [facility] Radiology Electronically Signed   By: [name] MD   On: 12/20/2020 11:11   IR US Guide Vasc Access Right  Result Date: 01/04/2021 INDICATION: 87 year old with renal failure and needs a  tunneled dialysis catheter. EXAM: FLUOROSCOPIC AND ULTRASOUND GUIDED PLACEMENT OF A TUNNELED DIALYSIS CATHETER Physician: Stephan Minister. Anselm Pancoast, MD MEDICATIONS: Ancef 2 g; The antibiotic was administered within an appropriate time interval prior to skin puncture. ANESTHESIA/SEDATION: Versed 0.5 mg IV; Fentanyl 25 mcg IV; Moderate Sedation Time:  20 minutes The patient was continuously monitored during the procedure by the interventional radiology nurse under my direct supervision. FLUOROSCOPY TIME:  Fluoroscopy Time: 24 seconds, 1 mGy COMPLICATIONS: None immediate. PROCEDURE: Informed consent was obtained for placement of a tunneled dialysis catheter. The patient was placed supine on the interventional table. Ultrasound confirmed a patent right internal jugular vein. Ultrasound images were obtained for documentation. The right neck and chest was prepped and draped in a sterile fashion. Maximal barrier sterile technique was utilized including caps, mask, sterile gowns, sterile gloves, sterile drape, hand hygiene and skin antiseptic. The right neck was anesthetized with 1% lidocaine. A small incision was made with #11 blade scalpel. A 21 gauge needle directed into the right internal jugular vein with ultrasound guidance. A micropuncture dilator set was placed. A 23 cm tip to cuff Palindrome catheter was selected. The skin below the right clavicle was anesthetized and a small incision was made with an #11 blade scalpel. A subcutaneous tunnel was formed to the vein dermatotomy site. The catheter was brought through the tunnel. The vein dermatotomy site was dilated to accommodate a peel-away sheath. The catheter was placed through the peel-away sheath and directed into the central venous structures. The tip of the catheter was placed at the superior cavoatrial junction with fluoroscopy. Fluoroscopic images were obtained for documentation. Both lumens were found to aspirate and flush well. The proper amount of heparin was flushed  in both lumens. The vein dermatotomy site was closed using a single layer of absorbable suture and Dermabond. The catheter was secured to the skin using Prolene suture. IMPRESSION: Successful placement of a right jugular tunneled dialysis catheter using ultrasound and fluoroscopic guidance. Electronically Signed   By: Markus Daft M.D.   On: 01/04/2021 20:02   DG CHEST PORT 1 VIEW  Result Date: 12/24/2020 CLINICAL DATA:  Central line placement. EXAM: PORTABLE CHEST 1 VIEW COMPARISON:  Dec 17, 2020. FINDINGS: Stable cardiomegaly. Status post coronary bypass graft. Stable position stent graft involving descending thoracic aorta. Interval placement of left internal jugular dialysis catheter with distal tip in expected position of cavoatrial junction. No pneumothorax is noted. Mild bibasilar atelectasis or infiltrates are noted. Bony thorax is unremarkable. IMPRESSION: Interval placement of left internal jugular dialysis catheter with distal tip in expected position of cavoatrial junction. Mild bibasilar atelectasis or infiltrates are noted. Electronically Signed   By: Marijo Conception M.D.   On: 12/24/2020 15:19   DG CHEST PORT 1 VIEW  Result Date: 12/17/2020 CLINICAL DATA:  History of aortic stent graft repair with persistent cough and hypoxia EXAM: PORTABLE CHEST 1 VIEW COMPARISON:  12/10/2020 FINDINGS: Cardiac shadow is enlarged but stable. Postsurgical changes are noted. Thoracic aortic stent graft is again seen  in the descending thoracic aorta stable from the prior exam. Lungs are well aerated bilaterally. Mild atelectatic changes are noted in the right base as well as in the left mid lung. No sizable effusion is noted. No bony abnormality is seen. IMPRESSION: Mild bilateral atelectatic changes. Electronically Signed   By: Inez Catalina M.D.   On: 12/17/2020 15:12   ECHOCARDIOGRAM COMPLETE  Result Date: 12/16/2020    ECHOCARDIOGRAM REPORT   Patient Name:   Andrew [name] Date of Exam: 12/16/2020 Medical Rec  #:  [id number]       Height:       68.0 in Accession #:    [id number]      Weight:       153.7 lb Date of Birth:  1931-10-19       BSA:          1.827 m Patient Age:    51 years        BP:           137/83 mmHg Patient Gender: M               HR:           68 bpm. Exam Location:  Inpatient Procedure: 2D Echo Indications:    congestive heart failure  History:        Patient has prior history of Echocardiogram examinations, most                 recent 01/15/2020. CAD, Abnormal ECG, aortic aneurysm.                 pneumnonia.; Risk Factors:Hypertension.  Sonographer:    Johny Chess Referring Phys: [id number] Patrecia Pour  Sonographer Comments: Image acquisition challenging due to respiratory motion and Image acquisition challenging due to uncooperative patient. IMPRESSIONS  1. Left ventricular ejection fraction, by estimation, is 60 to 65%. The left ventricle has normal function. The left ventricle has no regional wall motion abnormalities. Left ventricular diastolic function could not be evaluated.  2. Right ventricular systolic function is normal. The right ventricular size is normal. There is normal pulmonary artery systolic pressure. The estimated right ventricular systolic pressure is 97.6 mmHg.  3. Left atrial size was moderately dilated.  4. The mitral valve is degenerative. Trivial mitral valve regurgitation. Mild mitral stenosis. The mean mitral valve gradient is 3.3 mmHg. Moderate mitral annular calcification.  5. The aortic valve is calcified. There is moderate calcification of the aortic valve. There is moderate thickening of the aortic valve. Aortic valve regurgitation is not visualized. Mild to moderate aortic valve sclerosis/calcification is present, without any evidence of aortic stenosis. Comparison(s): No significant change from prior study. FINDINGS  Left Ventricle: Left ventricular ejection fraction, by estimation, is 60 to 65%. The left ventricle has normal function. The left ventricle has no  regional wall motion abnormalities. The left ventricular internal cavity size was normal in size. There is  no left ventricular hypertrophy. Left ventricular diastolic function could not be evaluated due to mitral annular calcification (moderate or greater). Left ventricular diastolic function could not be evaluated. Right Ventricle: The right ventricular size is normal. No increase in right ventricular wall thickness. Right ventricular systolic function is normal. There is normal pulmonary artery systolic pressure. The tricuspid regurgitant velocity is 2.77 m/s, and  with an assumed right atrial pressure of 3 mmHg, the estimated right ventricular systolic pressure is 73.4 mmHg. Left Atrium: Left atrial size was moderately dilated. Right Atrium: Right atrial  size was normal in size. Pericardium: There is no evidence of pericardial effusion. Mitral Valve: The mitral valve is degenerative in appearance. Moderate mitral annular calcification. Trivial mitral valve regurgitation. Mild mitral valve stenosis. The mean mitral valve gradient is 3.3 mmHg. Tricuspid Valve: The tricuspid valve is grossly normal. Tricuspid valve regurgitation is mild . No evidence of tricuspid stenosis. Aortic Valve: The aortic valve is calcified. There is moderate calcification of the aortic valve. There is moderate thickening of the aortic valve. Aortic valve regurgitation is not visualized. Mild to moderate aortic valve sclerosis/calcification is present, without any evidence of aortic stenosis. Aortic valve mean gradient measures 4.4 mmHg. Aortic valve peak gradient measures 10.3 mmHg. Aortic valve area, by VTI measures 1.76 cm. Pulmonic Valve: The pulmonic valve was grossly normal. Pulmonic valve regurgitation is not visualized. No evidence of pulmonic stenosis. Aorta: The aortic root and ascending aorta are structurally normal, with no evidence of dilitation. Venous: The inferior vena cava was not well visualized. IAS/Shunts: The atrial  septum is grossly normal.  LEFT VENTRICLE PLAX 2D LVIDd:         4.60 cm LVIDs:         4.00 cm LV PW:         0.90 cm LV IVS:        1.00 cm LVOT diam:     1.80 cm LV SV:         55 LV SV Index:   30 LVOT Area:     2.54 cm  LEFT ATRIUM             Index       RIGHT ATRIUM           Index LA diam:        3.70 cm 2.02 cm/m  RA Area:     16.50 cm LA Vol (A2C):   49.6 ml 27.15 ml/m RA Volume:   35.50 ml  19.43 ml/m LA Vol (A4C):   85.0 ml 46.52 ml/m LA Biplane Vol: 70.2 ml 38.42 ml/m  AORTIC VALVE AV Area (Vmax):    1.54 cm AV Area (Vmean):   1.68 cm AV Area (VTI):     1.76 cm AV Vmax:           160.17 cm/s AV Vmean:          98.149 cm/s AV VTI:            0.311 m AV Peak Grad:      10.3 mmHg AV Mean Grad:      4.4 mmHg LVOT Vmax:         96.96 cm/s LVOT Vmean:        64.748 cm/s LVOT VTI:          0.215 m LVOT/AV VTI ratio: 0.69  AORTA Ao Root diam: 3.60 cm Ao Asc diam:  2.90 cm MITRAL VALVE           TRICUSPID VALVE MV Area VTI:  1.69 cm TR Peak grad:   30.7 mmHg MV Mean grad: 3.3 mmHg TR Vmax:        277.00 cm/s MV VTI:       0.32 m                        SHUNTS                        Systemic VTI:  0.22 m  Systemic Diam: 1.80 cm Eleonore Chiquito MD Electronically signed by Eleonore Chiquito MD Signature Date/Time: 12/16/2020/4:25:14 PM    Final    IR TUNNELED CENTRAL VENOUS CATHETER PLACEMENT  Result Date: 01/04/2021 INDICATION: 87 year old with renal failure and needs a tunneled dialysis catheter. EXAM: FLUOROSCOPIC AND ULTRASOUND GUIDED PLACEMENT OF A TUNNELED DIALYSIS CATHETER Physician: Stephan Minister. Anselm Pancoast, MD MEDICATIONS: Ancef 2 g; The antibiotic was administered within an appropriate time interval prior to skin puncture. ANESTHESIA/SEDATION: Versed 0.5 mg IV; Fentanyl 25 mcg IV; Moderate Sedation Time:  20 minutes The patient was continuously monitored during the procedure by the interventional radiology nurse under my direct supervision. FLUOROSCOPY TIME:  Fluoroscopy Time: 24 seconds,  1 mGy COMPLICATIONS: None immediate. PROCEDURE: Informed consent was obtained for placement of a tunneled dialysis catheter. The patient was placed supine on the interventional table. Ultrasound confirmed a patent right internal jugular vein. Ultrasound images were obtained for documentation. The right neck and chest was prepped and draped in a sterile fashion. Maximal barrier sterile technique was utilized including caps, mask, sterile gowns, sterile gloves, sterile drape, hand hygiene and skin antiseptic. The right neck was anesthetized with 1% lidocaine. A small incision was made with #11 blade scalpel. A 21 gauge needle directed into the right internal jugular vein with ultrasound guidance. A micropuncture dilator set was placed. A 23 cm tip to cuff Palindrome catheter was selected. The skin below the right clavicle was anesthetized and a small incision was made with an #11 blade scalpel. A subcutaneous tunnel was formed to the vein dermatotomy site. The catheter was brought through the tunnel. The vein dermatotomy site was dilated to accommodate a peel-away sheath. The catheter was placed through the peel-away sheath and directed into the central venous structures. The tip of the catheter was placed at the superior cavoatrial junction with fluoroscopy. Fluoroscopic images were obtained for documentation. Both lumens were found to aspirate and flush well. The proper amount of heparin was flushed in both lumens. The vein dermatotomy site was closed using a single layer of absorbable suture and Dermabond. The catheter was secured to the skin using Prolene suture. IMPRESSION: Successful placement of a right jugular tunneled dialysis catheter using ultrasound and fluoroscopic guidance. Electronically Signed   By: Markus Daft M.D.   On: 01/04/2021 20:02    Subjective: Very eager to be discharged today  Discharge Exam: Vitals:   01/09/21 2022 01/10/21 0503  BP: (!) 136/56 (!) 158/67  Pulse: 78 65  Resp: 18 20   Temp: 98.1 F (36.7 C) 97.8 F (36.6 C)  SpO2: 98% 93%   Vitals:   01/09/21 0518 01/09/21 1427 01/09/21 2022 01/10/21 0503  BP: 126/65 (!) 146/66 (!) 136/56 (!) 158/67  Pulse: (!) 52 (!) 59 78 65  Resp: _0 Temp: 99.4 F (37.4 C) 98.4 F (36.9 C) 98.1 F (36.7 C) 97.8 F (36.6 C)  TempSrc: Oral  Oral Oral  SpO2: 91% 99% 98% 93%  Weight:      Height:        General: Pt is alert, awake, not in acute distress Cardiovascular: RRR, S1/S2 + Respiratory: CTA bilaterally, no wheezing, no rhonchi Abdominal: Soft, NT, ND, bowel sounds + Extremities: no edema, no cyanosis   The results of significant diagnostics from this hospitalization (including imaging, microbiology, ancillary and laboratory) are listed below for reference.     Microbiology: Recent Results (from the past 240 hour(s))  MRSA Next Gen by PCR, Nasal     Status:  None   Collection Time: 01/01/21 10:17 PM  Result Value Ref Range Status   MRSA by PCR Next Gen NOT DETECTED NOT DETECTED Final    Comment: (NOTE) The GeneXpert MRSA Assay (FDA approved for NASAL specimens only), is one component of a comprehensive MRSA colonization surveillance program. It is not intended to diagnose MRSA infection nor to guide or monitor treatment for MRSA infections. Test performance is not FDA approved in patients less than 58 years old. Performed at [facility], [place] [address] [place], [place] [address]   Resp Panel by RT-PCR (Flu A&B, Covid) Nasopharyngeal Swab     Status: None   Collection Time: 01/10/21 10:56 AM   Specimen: Nasopharyngeal Swab; Nasopharyngeal(NP) swabs in vial transport medium  Result Value Ref Range Status   SARS Coronavirus 2 by RT PCR NEGATIVE NEGATIVE Final    Comment: (NOTE) SARS-CoV-2 target nucleic acids are NOT DETECTED.  The SARS-CoV-2 RNA is generally detectable in upper respiratory specimens during the acute phase of infection. The lowest concentration of SARS-CoV-2 viral  copies this assay can detect is 138 copies/mL. A negative result does not preclude SARS-Cov-2 infection and should not be used as the sole basis for treatment or other patient management decisions. A negative result may occur with  improper specimen collection/handling, submission of specimen other than nasopharyngeal swab, presence of viral mutation(s) within the areas targeted by this assay, and inadequate number of viral copies(<138 copies/mL). A negative result must be combined with clinical observations, patient history, and epidemiological information. The expected result is Negative.  Fact Sheet for Patients:  EntrepreneurPulse.com.au  Fact Sheet for Healthcare Providers:  IncredibleEmployment.be  This test is no t yet approved or cleared by the [place] FDA and  has been authorized for detection and/or diagnosis of SARS-CoV-2 by FDA under an Emergency Use Authorization (EUA). This EUA will remain  in effect (meaning this test can be used) for the duration of the COVID-19 declaration under Section 564(b)(1) of the Act, 21 U.S.C.section 360bbb-3(b)(1), unless the authorization is terminated  or revoked sooner.       Influenza A by PCR NEGATIVE NEGATIVE Final   Influenza B by PCR NEGATIVE NEGATIVE Final    Comment: (NOTE) The Xpert Xpress SARS-CoV-2/FLU/RSV plus assay is intended as an aid in the diagnosis of influenza from Nasopharyngeal swab specimens and should not be used as a sole basis for treatment. Nasal washings and aspirates are unacceptable for Xpert Xpress SARS-CoV-2/FLU/RSV testing.  Fact Sheet for Patients: EntrepreneurPulse.com.au  Fact Sheet for Healthcare Providers: IncredibleEmployment.be  This test is not yet approved or cleared by the [place] FDA and has been authorized for detection and/or diagnosis of SARS-CoV-2 by FDA under an Emergency Use Authorization (EUA). This  EUA will remain in effect (meaning this test can be used) for the duration of the COVID-19 declaration under Section 564(b)(1) of the Act, 21 U.S.C. section 360bbb-3(b)(1), unless the authorization is terminated or revoked.  Performed at [facility], [place] [address], [place], [place] [address]      Labs: BNP (last 3 results) No results for input(s): BNP in the last 8760 hours. Basic Metabolic Panel: Recent Labs  Lab 01/06/21 0302 01/07/21 0248 01/08/21 0527 01/09/21 0152 01/10/21 0440  NA 139 139 138 137 138  K 3.8 3.7 3.3* 3.9 3.6  CL 98 99 100 100 103  CO2 _0 GLUCOSE 89 92 75 112* 90  BUN 50* 37* 69* 40* 66*  CREATININE 4.05* 3.31* 4.65* 3.40* 4.79*  CALCIUM 8.1* 8.3* 8.2* 8.2* 8.1*  PHOS 5.1* 4.6 5.5* 3.5 5.2*   Liver Function Tests: Recent Labs  Lab 01/06/21 0302 01/07/21 0248 01/08/21 0527 01/09/21 0152 01/10/21 0440  ALBUMIN 2.5* 2.4* 2.6* 2.4* 2.4*   No results for input(s): LIPASE, AMYLASE in the last 168 hours. No results for input(s): AMMONIA in the last 168 hours. CBC: Recent Labs  Lab 01/04/21 1055 01/05/21 0436 01/06/21 0302 01/07/21 0248 01/08/21 0527  WBC 11.8* 14.7* 9.2 13.5* 11.8*  HGB 9.0* 8.3* 8.4* 8.1* 8.4*  HCT 29.5* 25.6* 26.7* 26.0* 27.5*  MCV 98.0 95.5 97.8 97.4 98.9  PLT 216 171 159 152 173   Cardiac Enzymes: No results for input(s): CKTOTAL, CKMB, CKMBINDEX, TROPONINI in the last 168 hours. BNP: Invalid input(s): POCBNP CBG: Recent Labs  Lab 01/06/21 2056  GLUCAP 114*   D-Dimer No results for input(s): DDIMER in the last 72 hours. Hgb A1c No results for input(s): HGBA1C in the last 72 hours. Lipid Profile No results for input(s): CHOL, HDL, LDLCALC, TRIG, CHOLHDL, LDLDIRECT in the last 72 hours. Thyroid function studies No results for input(s): TSH, T4TOTAL, T3FREE, THYROIDAB in the last 72 hours.  Invalid input(s): FREET3 Anemia work up No results for input(s): VITAMINB12, FOLATE, FERRITIN,  TIBC, IRON, RETICCTPCT in the last 72 hours. Urinalysis    Component Value Date/Time   COLORURINE STRAW (A) 12/10/2020 1713   APPEARANCEUR CLEAR 12/10/2020 1713   LABSPEC 1.015 12/10/2020 1713   PHURINE 5.5 12/10/2020 1713   GLUCOSEU NEGATIVE 12/10/2020 1713   HGBUR MODERATE (A) 12/10/2020 1713   BILIRUBINUR NEGATIVE 12/10/2020 1713   KETONESUR NEGATIVE 12/10/2020 1713   PROTEINUR 30 (A) 12/10/2020 1713   UROBILINOGEN 0.2 06/09/2009 0918   NITRITE NEGATIVE 12/10/2020 1713   LEUKOCYTESUR NEGATIVE 12/10/2020 1713   Sepsis Labs Invalid input(s): PROCALCITONIN,  WBC,  LACTICIDVEN Microbiology Recent Results (from the past 240 hour(s))  MRSA Next Gen by PCR, Nasal     Status: None   Collection Time: 01/01/21 10:17 PM  Result Value Ref Range Status   MRSA by PCR Next Gen NOT DETECTED NOT DETECTED Final    Comment: (NOTE) The GeneXpert MRSA Assay (FDA approved for NASAL specimens only), is one component of a comprehensive MRSA colonization surveillance program. It is not intended to diagnose MRSA infection nor to guide or monitor treatment for MRSA infections. Test performance is not FDA approved in patients less than 93 years old. Performed at [facility], [place] [address], [place], [place] [address]   Resp Panel by RT-PCR (Flu A&B, Covid) Nasopharyngeal Swab     Status: None   Collection Time: 01/10/21 10:56 AM   Specimen: Nasopharyngeal Swab; Nasopharyngeal(NP) swabs in vial transport medium  Result Value Ref Range Status   SARS Coronavirus 2 by RT PCR NEGATIVE NEGATIVE Final    Comment: (NOTE) SARS-CoV-2 target nucleic acids are NOT DETECTED.  The SARS-CoV-2 RNA is generally detectable in upper respiratory specimens during the acute phase of infection. The lowest concentration of SARS-CoV-2 viral copies this assay can detect is 138 copies/mL. A negative result does not preclude SARS-Cov-2 infection and should not be used as the sole basis for treatment or other  patient management decisions. A negative result may occur with  improper specimen collection/handling, submission of specimen other than nasopharyngeal swab, presence of viral mutation(s) within the areas targeted by this assay, and inadequate number of viral copies(<138 copies/mL). A negative result must be combined with  clinical observations, patient history, and epidemiological information. The expected result is Negative.  Fact Sheet for Patients:  EntrepreneurPulse.com.au  Fact Sheet for Healthcare Providers:  IncredibleEmployment.be  This test is no t yet approved or cleared by the [place] FDA and  has been authorized for detection and/or diagnosis of SARS-CoV-2 by FDA under an Emergency Use Authorization (EUA). This EUA will remain  in effect (meaning this test can be used) for the duration of the COVID-19 declaration under Section 564(b)(1) of the Act, 21 U.S.C.section 360bbb-3(b)(1), unless the authorization is terminated  or revoked sooner.       Influenza A by PCR NEGATIVE NEGATIVE Final   Influenza B by PCR NEGATIVE NEGATIVE Final    Comment: (NOTE) The Xpert Xpress SARS-CoV-2/FLU/RSV plus assay is intended as an aid in the diagnosis of influenza from Nasopharyngeal swab specimens and should not be used as a sole basis for treatment. Nasal washings and aspirates are unacceptable for Xpert Xpress SARS-CoV-2/FLU/RSV testing.  Fact Sheet for Patients: EntrepreneurPulse.com.au  Fact Sheet for Healthcare Providers: IncredibleEmployment.be  This test is not yet approved or cleared by the [place] FDA and has been authorized for detection and/or diagnosis of SARS-CoV-2 by FDA under an Emergency Use Authorization (EUA). This EUA will remain in effect (meaning this test can be used) for the duration of the COVID-19 declaration under Section 564(b)(1) of the Act, 21 U.S.C. section  360bbb-3(b)(1), unless the authorization is terminated or revoked.  Performed at [facility], [place] [address], [place], [place] [address]    Time spent: 84mn  SIGNED:   SMarylu Lund MD  [facility] 01/10/2021, 2:31 PM  If 7PM-7AM, please contact night-coverage

## 2021-01-10 NOTE — Progress Notes (Signed)
Physical Therapy Treatment Patient Details Name: Andrew [name] MRN: [id number] DOB: [birth year] Today's Date: 01/10/2021    History of Present Illness Pt is an 87 y.o. male admitted 12/10/20 with abnormal blood work. Workup for ARF with uremia. Imaging notable for PNA. S/p temprorary HD cath placement 5/30; iHD initiated. Plan for tunneled HD cath placement 6/13 or 6/14. PMH includes CABG, TAA, AAA, CHF, CKD2, remote afib (not on anticoagulation), back sx.    PT Comments    Patient with much improved tolerance for activity today compared to when last seen by PT. (Last visit was on a dialysis day). He remains afraid of falling and pushes posteriorly in sitting and less so in standing. Did well with standing in [place] and would progress to using RW next visit.     Follow Up Recommendations  SNF;Supervision for mobility/OOB     Equipment Recommendations  Wheelchair (measurements PT);Wheelchair cushion (measurements PT);Hospital bed    Recommendations for Other Services       Precautions / Restrictions Precautions Precautions: Fall;Other (comment) Precaution Comments: Significant anxiety/fearful of falling; frequent urge to have BM with mobility    Mobility  Bed Mobility Overal bed mobility: Needs Assistance Bed Mobility: Rolling;Sidelying to Sit Rolling: Min guard (with rail and max cues) Sidelying to sit: HOB elevated;+2 for physical assistance;+2 for safety/equipment;Mod assist       General bed mobility comments: pt feeling better and able to assist more with mobility; max cues for sequening    Transfers Overall transfer level: Needs assistance Equipment used: Ambulation equipment used Transfers: Sit to/from Stand Sit to Stand: Min assist;+2 physical assistance;+2 safety/equipment;From elevated surface         General transfer comment: pt able to rise to stedy and sit on seat; repeated 2nd time and 3rd time he wanted to sit on the recliner. Stood for up to 60 seconds  with posterior lean and pulling on cross bar of stedy  Ambulation/Gait                 Stairs             Wheelchair Mobility    Modified Rankin (Stroke Patients Only)       Balance Overall balance assessment: Needs assistance Sitting-balance support: Bilateral upper extremity supported;Feet supported;No upper extremity supported Sitting balance-Leahy Scale: Poor Sitting balance - Comments: Pt with strong posterior lean requiring initially total assist for support and progressed to min assist once achieved upright/midline posture Postural control: Posterior lean Standing balance support: Bilateral upper extremity supported;During functional activity Standing balance-Leahy Scale: Poor Standing balance comment: Reliant on UE support                            Cognition Arousal/Alertness: Awake/alert Behavior During Therapy: Anxious Overall Cognitive Status: Impaired/Different from baseline Area of Impairment: Attention;Memory;Following commands;Safety/judgement;Awareness                   Current Attention Level: Sustained Memory: Decreased short-term memory (cannot recall if he got OOB yesterday) Following Commands: Follows one step commands with increased time;Follows one step commands inconsistently Safety/Judgement: Decreased awareness of safety Awareness: Intellectual Problem Solving: Slow processing;Decreased initiation;Requires verbal cues;Requires tactile cues;Difficulty sequencing General Comments: Limited by significant anxiety/fear of falling, requries increased time to follow commands, wife present during session and providing support and encouragment      Exercises      General Comments General comments (skin integrity, edema, etc.): Wife present and encouraging  pt      Pertinent Vitals/Pain Pain Assessment: Faces Faces Pain Scale: Hurts a little bit Pain Location: Generalized Pain Descriptors / Indicators:  Discomfort;Tiring Pain Intervention(s): Limited activity within patient's tolerance    Home Living                      Prior Function            PT Goals (current goals can now be found in the care plan section) Acute Rehab PT Goals Patient Stated Goal: to lay back down Time For Goal Achievement: 01/10/21 Potential to Achieve Goals: Good Progress towards PT goals: Progressing toward goals;PT to reassess next treatment    Frequency    Min 2X/week      PT Plan Current plan remains appropriate    Co-evaluation              AM-PAC PT "6 Clicks" Mobility   Outcome Measure  Help needed turning from your back to your side while in a flat bed without using bedrails?: A Lot Help needed moving from lying on your back to sitting on the side of a flat bed without using bedrails?: Total Help needed moving to and from a bed to a chair (including a wheelchair)?: Total Help needed standing up from a chair using your arms (e.g., wheelchair or bedside chair)?: Total Help needed to walk in hospital room?: Total Help needed climbing 3-5 steps with a railing? : Total 6 Click Score: 7    End of Session Equipment Utilized During Treatment: Gait belt Activity Tolerance: Patient limited by fatigue;Other (comment) (pt limited by anxiety/fearful of falling) Patient left: with call bell/phone within reach;with family/visitor present;in chair;with chair alarm set Nurse Communication: Mobility status;Need for lift equipment PT Visit Diagnosis: Muscle weakness (generalized) (M62.81);Difficulty in walking, not elsewhere classified (R26.2)     Time: 5170-0174 PT Time Calculation (min) (ACUTE ONLY): 17 min  Charges:  $Therapeutic Activity: 8-22 mins                      Arby Barrette, PT Pager [phone]    Rexanne Mano 01/10/2021, 12:24 PM

## 2021-01-10 NOTE — Progress Notes (Signed)
Renal Navigator confirmed plan for discharge today with TOC CSW/G. Rose Fillers and informed [facility] Admissions and outpatient HD clinic/[facility] that discharge will carry out as planned. Patient will start at [facility] tomorrow, 6/21. Navigator faxed Discharge Summary. HD orders were faxed by Renal PA over the weekend. CSW has informed SNF of outpatient HD instructions and wife is aware based on conversation with Navigator on 6/17.  Andrew [name], [place] Renal Navigator [phone]

## 2021-01-10 NOTE — TOC Progression Note (Addendum)
Transition of Care [facility]) - Progression Note    Patient Details  Name: Andrew [name] MRN: [id number] Date of Birth: 01/27/1932  [facility] CM/SW Contact  Joanne Chars, LCSW Phone Number: 01/10/2021, 10:19 AM  Clinical Narrative:   Authorization was not initiated over the weekend as requested.  CSW attempted to initiate auth through portal, received error message, Josem Kaufmann initiated over the phone.  Last PT note is 6/16, per Navi this is now out of date and a new note is needed, CSW messaged PT to request updated note.   1240:Updated PT note faxed to [facility].  1430: Candace Cruise approved, 3 days, 6/20-6/22 with review on 6/22.  Ref# [id number]. Covid also back and negative.  CSW spoke with Jordan at [facility], she is expecting pt today.  They are also able to provide hoyer lift pad for HD transfer.    HD schedule: Tuesday/Thursday/Saturday at [facility], [address], [place], [place].    Arrive 1010am, seat time 1030am.  Please have pt arrive at 930am on first day, 01/11/21.     Expected Discharge Plan: Skilled Nursing Facility Barriers to Discharge: Ship broker, Continued Medical Work up  Expected Discharge Plan and Services Expected Discharge Plan: [place] Choice: [place] arrangements for the past 2 months: Single Family Home                                       Social Determinants of Health (SDOH) Interventions    Readmission Risk Interventions No flowsheet data found.

## 2021-01-10 NOTE — TOC Transition Note (Signed)
Transition of Care [facility]) - CM/SW Discharge Note   Patient Details  Name: Andrew [name] MRN: [id number] Date of Birth: Jun 20, 1932  [facility]) CM/SW Contact:  Andrew Chars, LCSW Phone Number: 01/10/2021, 2:43 PM   Clinical Narrative:  Pt discharging to [facility].  RN call report to [phone].       Final next level of care: Skilled Nursing Facility Barriers to Discharge: Barriers Resolved   Patient Goals and CMS Choice Patient states their goals for this hospitalization and ongoing recovery are:: walking CMS Medicare.gov Compare Post Acute Care list provided to:: Patient Represenative (must comment) Choice offered to / list presented to : Spouse  Discharge Placement              Patient chooses bed at: [facility] Patient to be transferred to facility by: [place] Name of family member notified: wife Andrew [name] Patient and family notified of of transfer: 01/10/21  Discharge Plan and Services     Post Acute Care Choice: [place]                               Social Determinants of Health (SDOH) Interventions     Readmission Risk Interventions No flowsheet data found.

## 2021-01-10 NOTE — Progress Notes (Signed)
Attempted to call report to [facility].

## 2021-01-10 NOTE — NC FL2 (Signed)
[place] LEVEL OF CARE SCREENING TOOL     IDENTIFICATION  Patient Name: Andrew [name] Birthdate: 01/26/1932 Sex: male Admission Date (Current Location): 12/10/2020  [facility] and [place] Number:  Herbalist and Address:  [facility] [place], [place] [address], [place], [place] [address]      Provider Number: [id number]  Attending Physician Name and Address:  Donne Hazel, MD  Relative Name and Phone Number:  Dashan, Chizmar [phone]  [phone]    Current Level of Care: Hospital Recommended Level of Care: [place] Prior Approval Number:    Date Approved/Denied:   PASRR Number: [id number]  Discharge Plan: SNF    Current Diagnoses: Patient Active Problem List   Diagnosis Date Noted   AKI (acute kidney injury) ([place]) 12/10/2020   Normocytic anemia 12/10/2020   Chronic diastolic CHF (congestive heart failure) ([place]) 12/10/2020   Community acquired pneumonia 12/10/2020   Chest pain 09/26/2019   Aneurysm of thoracic aorta ([place]) 09/22/2019   AAA (abdominal aortic aneurysm) without rupture ([place]) 04/29/2019   Pain in left knee 12/17/2018   Prepatellar bursitis of left knee 12/17/2018   Lower urinary tract infectious disease    Acute UTI 09/20/2018   Coronary artery disease 09/20/2018   Urinary retention 09/20/2018   Thoracic aortic aneurysm without rupture ([place]) 07/12/2018   Bilateral nonexudative age-related macular degeneration 12/24/2017   Fuchs' corneal dystrophy 12/24/2017   Pseudophakia of both eyes 12/24/2017   S/P YAG capsulotomy, bilateral 12/24/2017   Epigastric hernia 09/11/2017   ED (erectile dysfunction) of organic origin 02/21/2016   Allergic reaction 06/01/2015   Angioedema 06/01/2015   S/P orthopedic surgery, follow-up exam 02/09/2015   Tendinosis 12/15/2014   Spermatocele 03/23/2014   Spondylolisthesis of lumbar region 03/09/2014   Essential hypertension 10/28/2013   Lumbar  radiculopathy 10/01/2013   Spinal stenosis 08/05/2013   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/28/2013   Neurogenic bladder disorder 07/28/2013   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   Benign prostate hyperplasia 02/17/2013   Left varicocele 02/12/2012   Pain in testicle 02/12/2012    Orientation RESPIRATION BLADDER Height & Weight     Self, Time, Situation, Place  O2 Indwelling catheter Weight: 133 lb 6.1 oz (60.5 kg) Height:  5\' 8"  (172.7 cm)  BEHAVIORAL SYMPTOMS/MOOD NEUROLOGICAL BOWEL NUTRITION STATUS      Incontinent Diet (see discharge summary)  AMBULATORY STATUS COMMUNICATION OF NEEDS Skin   Total Care Verbally Normal                       Personal Care Assistance Level of Assistance  Bathing, Feeding, Dressing Bathing Assistance: Maximum assistance Feeding assistance: Limited assistance Dressing Assistance: Maximum assistance     Functional Limitations Info  Sight, Hearing, Speech Sight Info: Adequate Hearing Info: Adequate Speech Info: Adequate    SPECIAL CARE FACTORS FREQUENCY  PT (By licensed PT), OT (By licensed OT)     PT Frequency: 5x week OT Frequency: 5x week            Contractures Contractures Info: Not present    Additional Factors Info  Code Status Code Status Info: DNR Allergies Info: Quinolones, Naltrexone           Current Medications (01/10/2021):  This is the current hospital active medication list Current Facility-Administered Medications  Medication Dose Route Frequency Provider Last Rate Last Admin   (feeding supplement) PROSource Plus liquid 30 mL  30 mL Oral TID BM Amin, Jeanella Flattery, MD  30 mL at 01/10/21 0836   0.9 %  sodium chloride infusion   Intravenous Continuous Donato Heinz, MD 10 mL/hr at 01/01/21 1026 Rate Change at 01/01/21 1026   0.9 %  sodium chloride infusion  100 mL Intravenous PRN Madelon Lips, MD       0.9 %  sodium chloride infusion  100 mL Intravenous PRN Madelon Lips, MD       acetaminophen (TYLENOL) tablet 650  mg  650 mg Oral Q6H PRN Vianne Bulls, MD   650 mg at 01/06/21 2043   Or   acetaminophen (TYLENOL) suppository 650 mg  650 mg Rectal Q6H PRN Opyd, Ilene Qua, MD       albuterol (PROVENTIL) (2.5 MG/3ML) 0.083% nebulizer solution 2.5 mg  2.5 mg Nebulization Q2H PRN Opyd, Ilene Qua, MD       alteplase (CATHFLO ACTIVASE) injection 2 mg  2 mg Intracatheter Once PRN Madelon Lips, MD       aspirin chewable tablet 81 mg  81 mg Oral QPC supper Donne Hazel, MD   81 mg at 01/09/21 1834   atorvastatin (LIPITOR) tablet 20 mg  20 mg Oral Daily Opyd, Ilene Qua, MD   20 mg at 01/10/21 7371   Chlorhexidine Gluconate Cloth 2 % PADS 6 each  6 each Topical Daily Donne Hazel, MD   6 each at 01/09/21 0811   Chlorhexidine Gluconate Cloth 2 % PADS 6 each  6 each Topical Q0600 Madelon Lips, MD   6 each at 01/10/21 0505   Darbepoetin Alfa (ARANESP) injection 40 mcg  40 mcg Intravenous Q Sat-HD Madelon Lips, MD   40 mcg at 01/08/21 1210   feeding supplement (BOOST / RESOURCE BREEZE) liquid 1 Container  1 Container Oral TID BM Patrecia Pour, MD   1 Container at 01/09/21 2025   ferrous sulfate tablet 325 mg  325 mg Oral Q breakfast Claudia Desanctis, MD   325 mg at 01/10/21 0836   heparin injection 1,000 Units  1,000 Units Dialysis PRN Madelon Lips, MD       heparin injection 5,000 Units  5,000 Units Subcutaneous Q8H Patrecia Pour, MD   5,000 Units at 01/10/21 0505   heparin sodium (porcine) injection 1,000 Units  1,000 Units Intravenous Q dialysis Donato Heinz, MD   1,000 Units at 01/08/21 1211   lidocaine (PF) (XYLOCAINE) 1 % injection 5 mL  5 mL Intradermal PRN Madelon Lips, MD       lidocaine-prilocaine (EMLA) cream 1 application  1 application Topical PRN Madelon Lips, MD       midodrine (PROAMATINE) tablet 5 mg  5 mg Oral TID PRN Amin, Ankit Chirag, MD   5 mg at 01/08/21 1045   ondansetron (ZOFRAN) tablet 4 mg  4 mg Oral Q6H PRN Opyd, Ilene Qua, MD       Or   ondansetron (ZOFRAN)  injection 4 mg  4 mg Intravenous Q6H PRN Opyd, Ilene Qua, MD       pentafluoroprop-tetrafluoroeth (GEBAUERS) aerosol 1 application  1 application Topical PRN Madelon Lips, MD       phenol (CHLORASEPTIC) mouth spray 1 spray  1 spray Mouth/Throat PRN Donne Hazel, MD       polyethylene glycol (MIRALAX / GLYCOLAX) packet 17 g  17 g Oral Daily PRN Donne Hazel, MD       senna-docusate (Senokot-S) tablet 1 tablet  1 tablet Oral BID Patrecia Pour, MD   1 tablet at 01/10/21  7858   sodium chloride (OCEAN) 0.65 % nasal spray 1 spray  1 spray Each Nare PRN Amin, Jeanella Flattery, MD         Discharge Medications: Please see discharge summary for a list of discharge medications.  Relevant Imaging Results:  Relevant Lab Results:   Additional Information SSN: [id number].  Pt is vaccinated for covid and boosted.  Joanne Chars, LCSW

## 2021-01-11 ENCOUNTER — Other Ambulatory Visit (HOSPITAL_COMMUNITY): Payer: Medicare Other

## 2021-01-11 ENCOUNTER — Ambulatory Visit: Payer: Medicare Other | Admitting: Vascular Surgery

## 2021-01-12 ENCOUNTER — Non-Acute Institutional Stay (SKILLED_NURSING_FACILITY): Payer: Medicare Other | Admitting: Orthopedic Surgery

## 2021-01-12 ENCOUNTER — Encounter: Payer: Self-pay | Admitting: Orthopedic Surgery

## 2021-01-12 DIAGNOSIS — I714 Abdominal aortic aneurysm, without rupture, unspecified: Secondary | ICD-10-CM

## 2021-01-12 DIAGNOSIS — I10 Essential (primary) hypertension: Secondary | ICD-10-CM

## 2021-01-12 DIAGNOSIS — I712 Thoracic aortic aneurysm, without rupture, unspecified: Secondary | ICD-10-CM

## 2021-01-12 DIAGNOSIS — L853 Xerosis cutis: Secondary | ICD-10-CM

## 2021-01-12 DIAGNOSIS — N179 Acute kidney failure, unspecified: Secondary | ICD-10-CM | POA: Diagnosis not present

## 2021-01-12 DIAGNOSIS — I953 Hypotension of hemodialysis: Secondary | ICD-10-CM

## 2021-01-12 DIAGNOSIS — N401 Enlarged prostate with lower urinary tract symptoms: Secondary | ICD-10-CM | POA: Diagnosis not present

## 2021-01-12 DIAGNOSIS — I2510 Atherosclerotic heart disease of native coronary artery without angina pectoris: Secondary | ICD-10-CM | POA: Diagnosis not present

## 2021-01-12 DIAGNOSIS — D508 Other iron deficiency anemias: Secondary | ICD-10-CM

## 2021-01-12 DIAGNOSIS — R531 Weakness: Secondary | ICD-10-CM

## 2021-01-12 DIAGNOSIS — R338 Other retention of urine: Secondary | ICD-10-CM

## 2021-01-12 NOTE — Progress Notes (Signed)
Location:  [place] Room Number: 35 Place of Service:  SNF [phone]) Provider:  Windell Moulding, AGNP-C  [name], MD  Patient Care Team: [name], MD as PCP - General (Family Medicine) Debara Pickett Nadean Corwin, MD as PCP - Cardiology (Cardiology)  Extended Emergency Contact Information Primary Emergency Contact: Wylene Simmer Address: [address]          [place] Phone: [phone] Mobile Phone: [phone] Relation: Spouse Secondary Emergency Contact: [place] Mobile Phone: [phone] Relation: Daughter  Code Status:  DNR Goals of care: Advanced Directive information Advanced Directives 12/11/2020  Does Patient Have a Medical Advance Directive? -  Type of Advance Directive -  Does patient want to make changes to medical advance directive? -  Copy of [place] in Chart? -  Would patient like information on creating a medical advance directive? No - Patient declined     Chief Complaint  Patient presents with   Hospitalization Follow-up    HPI:  Pt is a 87 y.o. male seen today for f/u s/p hospitalization Z[facility] Pontes 05/20-06/20.   Wife and daughter present during encounter.   He currently resides on the skilled unit at [facility]. Past medical history includes: AAA, aneurysm of thoracic aorta, CAD, HTN, recent CAP, lumbar radiculopathy, BPH with retention, and acute kidney injury.   Prior to hospitalization he lived at home with wife. Presented to ED due to abnormal lab work and unresolved cough on Augmentin. Labs indicated mild leukocytosis, worsening anemia, and renal failure. Creatinine increased to 9.32 from 8.71. CT chest/abdomen/pelvis noted new left upper lob infiltrate, unchanged stent graft repair to thoracic aortic aneurysm and stable infrarenal AAA.   Acute kidney injury, nonoliguric- followed by nephrology, 06/14 s/p tunneled HD cath placed, plan to trial  hemodialysis since no renal improvement, dialysis TTS. Palliative consulted.  Acute on chronic urinary retention- foley placed during hospitalization, unsuccessful voiding trial when removed 06/11, foley reinserted due to ongoing retention.  CAP- given rocephin, azithromycin and albuterol. Denies chest pain or sob today. Room air.  Hypotension, intermittent- midodrine 5 mg tid prn, cortisol 18.2  and TSH 4.178 12/30/2020.   Chronic HFpEF- LV EF 60-65%, followed by nephrology Thoracic aortic aneurysm s/p repair- remains stable AAA- f/u scheduled 07/26 with Dr. Carlis Abbott Iron deficiency anemia- no acute bleeding, given iron supplements, hgb 8.4 06/18, given 1 unit PRBC 06/11. Last BM today.  Weakness- prior to hospitalization he walked with a walker, he is now using wheelchair.   No recent falls, injuries or behavioral outbursts.   Recent blood pressures:  06/22- 152/73, 150/68  06/21- 127/76, 174/86, 165/78  Nurse does not report any other concerns, vitals stable.   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 bph, pt self catheterizes himself on schedule   Complication of anesthesia    had to have Foley post op cabg and lumb lam   Coronary artery disease    s/p remote PCI // s/p CABG in 2010 Echo 01/2019: EF 60-65, Gr 1 DD, normal RVSF, RVSP 36, mild LAE, mild to mod TR   Hypertension    PVC's (premature ventricular contractions)    Monitor 12/2018:  Sinus rhythm with  frequent PVC's (11.5%) and periods of bigeminy.    Thoracic aortic aneurysm ([place])    09/29/16 CT: 4.3 cm ascending, 4.5 cm descending. 1 yr f/u rec // s/p endovascular repair 09/2019 (Dr. Carlis Abbott)   Urinary retention    Wears glasses    Past Surgical History:  Procedure Laterality Date   BACK SURGERY  2012   lumb lam   CARDIAC  CATHETERIZATION     2010-per patient   COLONOSCOPY     CORONARY ARTERY BYPASS GRAFT  2010   triple   EPIGASTRIC HERNIA REPAIR N/A 09/11/2017   Procedure: HERNIA REPAIR EPIGASTRIC ADULT;  Surgeon: Rolm Bookbinder, MD;  Location: [place];  Service: General;  Laterality: N/A;   HERNIA REPAIR     [facility] [place]  09/11/2017   with mesh   INGUINAL HERNIA REPAIR  07/30/2012   Procedure: HERNIA REPAIR INGUINAL ADULT;  Surgeon: Rolm Bookbinder, MD;  Location: [place];  Service: General;  Laterality: Left;  Left Hernia Site   INSERTION OF DIALYSIS CATHETER N/A 12/24/2020   Procedure: INSERTION OF DIALYSIS CATHETER;  Surgeon: Kipp Brood, MD;  Location: [facility];  Service: Pulmonary;  Laterality: N/A;   INSERTION OF MESH  07/30/2012   Procedure: INSERTION OF MESH;  Surgeon: Rolm Bookbinder, MD;  Location: [place];  Service: General;  Laterality: Left;  Left Inguinal Hernia   INSERTION OF MESH N/A 09/11/2017   Procedure: INSERTION OF MESH;  Surgeon: Rolm Bookbinder, MD;  Location: [place];  Service: General;  Laterality: N/A;   IR FLUORO GUIDE CV LINE LEFT  12/20/2020   IR PERC TUN PERIT CATH WO PORT S&I /IMAG  01/04/2021   IR US GUIDE [place] LEFT  12/20/2020   IR US GUIDE VASC ACCESS RIGHT  01/04/2021   LAPAROSCOPY N/A 09/11/2017   Procedure: DIAGNOSTIC LAPAOSCOPY;  Surgeon: Rolm Bookbinder, MD;  Location: [place];  Service: General;  Laterality: N/A;  ERAS pathway   SPINE SURGERY     lumbar laminectomy   THORACIC AORTIC ENDOVASCULAR STENT GRAFT N/A 09/22/2019   Procedure: THORACIC AORTIC ENDOVASCULAR STENT GRAFT;  Surgeon: Marty Heck, MD;  Location: [place];  Service: Vascular;  Laterality: N/A;   TONSILLECTOMY      Allergies  Allergen Reactions   Quinolones Other (See Comments)    Patient has an abdominal aortic aneurysm    Naltrexone     Other reaction(s): GI Upset (intolerance), NVD    Outpatient Encounter Medications as of  01/12/2021  Medication Sig   aspirin 81 MG tablet Take 81 mg by mouth daily after supper.    atorvastatin (LIPITOR) 20 MG tablet TAKE 1 TABLET ONCE DAILY AT 6 PM.   Cholecalciferol (VITAMIN D3) 1000 units CAPS Take 1,000 Units by mouth daily.   Co-Enzyme Q-10 100 MG CAPS Take 100 mg by mouth daily.    CRANBERRY EXTRACT PO Take 1 tablet by mouth daily.   ferrous sulfate 325 (65 FE) MG tablet Take 325 mg by mouth daily with breakfast.   loratadine (CLARITIN) 10 MG tablet Take 10 mg by mouth daily as needed for allergies or rhinitis.   midodrine (PROAMATINE) 5 MG tablet Take 1 tablet (5 mg total) by mouth 3 (three) times daily as needed (SBP <120).   Multiple Vitamins-Minerals (PRESERVISION AREDS 2 PO) Take 1 capsule by mouth 2 (two) times daily.   oxymetazoline (AFRIN) 0.05 % nasal spray Place 1 spray into both nostrils at bedtime as needed for  congestion.   polyethylene glycol (MIRALAX / GLYCOLAX) 17 g packet Take 17 g by mouth daily as needed for moderate constipation.   sodium chloride (MURO 128) 5 % ophthalmic ointment Place 1 application into both eyes at bedtime.   triamcinolone (NASACORT ALLERGY 24HR) 55 MCG/ACT AERO nasal inhaler Place 2 sprays into the nose daily as needed (for allergies).    TURMERIC PO Take 1 capsule by mouth daily after breakfast.   vitamin B-12 (CYANOCOBALAMIN) 1000 MCG tablet Take 1,000 mcg by mouth daily.    vitamin C (ASCORBIC ACID) 250 MG tablet Take 250 mg by mouth daily.    No facility-administered encounter medications on file as of 01/12/2021.    Review of Systems  Constitutional:  Negative for activity change, appetite change, fatigue and fever.  HENT:  Positive for hearing loss. Negative for congestion, dental problem and trouble swallowing.   Eyes:  Negative for visual disturbance.       Glasses  Respiratory:  Negative for cough, shortness of breath and wheezing.   Gastrointestinal:  Negative for abdominal distention, abdominal pain, blood in stool,  constipation, diarrhea and nausea.  Genitourinary:  Negative for hematuria.       Indwelling foley  Musculoskeletal:  Positive for back pain and gait problem.  Skin:        Dry skin, scattered scabbing  Neurological:  Positive for weakness. Negative for dizziness and headaches.  Hematological:  Bruises/bleeds easily.  Psychiatric/Behavioral:  Negative for confusion, dysphoric mood and sleep disturbance. The patient is not nervous/anxious.    Immunization History  Administered Date(s) Administered   Influenza, High Dose Seasonal PF 05/01/2016   PFIZER(Purple Top)SARS-COV-2 Vaccination 08/13/2019, 09/01/2019   Pertinent  Health Maintenance Due  Topic Date Due   PNA vac Low Risk Adult (2 of 2 - PPSV23) 05/10/2016   INFLUENZA VACCINE  02/21/2021   No flowsheet data found. Functional Status Survey:    Vitals:   01/12/21 1054  BP: (!) 152/73  Pulse: 62  Resp: 16  Temp: 97.8 F (36.6 C)  SpO2: 97%  Weight: 138 lb 1.6 oz (62.6 kg)  Height: _0  (1.727 m)   Body mass index is 21 kg/m. Physical Exam Vitals reviewed.  Constitutional:      General: He is not in acute distress. HENT:     Head: Normocephalic.     Right Ear: There is no impacted cerumen.     Left Ear: There is no impacted cerumen.     Nose: Nose normal.     Mouth/Throat:     Mouth: Mucous membranes are moist.  Eyes:     General:        Right eye: No discharge.        Left eye: No discharge.  Neck:     Vascular: No carotid bruit.  Cardiovascular:     Rate and Rhythm: Normal rate. Rhythm irregular.     Pulses: Normal pulses.     Heart sounds: Murmur heard.     Comments: Dialysis catheter to right upper chest, dressing CDI.  Pulmonary:     Effort: Pulmonary effort is normal. No respiratory distress.     Breath sounds: Normal breath sounds. No wheezing.  Abdominal:     General: Bowel sounds are normal. There is no distension.     Palpations: Abdomen is soft.     Tenderness: There is no abdominal  tenderness.  Musculoskeletal:     Cervical back: Normal range of motion.     Right lower  leg: No edema.     Left lower leg: No edema.  Lymphadenopathy:     Cervical: No cervical adenopathy.  Skin:    General: Skin is warm and dry.     Capillary Refill: Capillary refill takes less than 2 seconds.     Coloration: Skin is pale.     Comments: Skin dry, scattered scabbing to head and extremities. Skin tear to right elbow, covered with non adhesive dressing, no drainage.   Neurological:     General: No focal deficit present.     Mental Status: He is alert and oriented to person, place, and time.     Motor: Weakness present.     Gait: Gait abnormal.     Comments: wheelchair  Psychiatric:        Mood and Affect: Mood normal.        Behavior: Behavior normal.    Labs reviewed: Recent Labs    01/01/21 0349 01/02/21 0327 01/03/21 0434 01/04/21 0044 01/08/21 0527 01/09/21 0152 01/10/21 0440  NA 139 138 140   < > 138 137 138  K 3.7 4.2 4.0   < > 3.3* 3.9 3.6  CL 102 102 103   < > 100 100 103  CO2 _0 < > _1 GLUCOSE 88 92 83   < > 75 112* 90  BUN 64* 33* 55*   < > 69* 40* 66*  CREATININE 5.21* 3.30* 4.39*   < > 4.65* 3.40* 4.79*  CALCIUM 8.1* 8.3* 8.5*   < > 8.2* 8.2* 8.1*  MG 1.8 1.7 1.9  --   --   --   --   PHOS 6.7* 4.3 6.2*   < > 5.5* 3.5 5.2*   < > = values in this interval not displayed.   Recent Labs    12/10/20 1713 12/11/20 1209 01/08/21 0527 01/09/21 0152 01/10/21 0440  AST 12*  --   --   --   --   ALT 9  --   --   --   --   ALKPHOS 66  --   --   --   --   BILITOT 0.3  --   --   --   --   PROT 7.3  --   --   --   --   ALBUMIN 3.5   < > 2.6* 2.4* 2.4*   < > = values in this interval not displayed.   Recent Labs    12/10/20 1713 12/10/20 2323 01/06/21 0302 01/07/21 0248 01/08/21 0527  WBC 11.8*   < > 9.2 13.5* 11.8*  NEUTROABS 10.0*  --   --   --   --   HGB 9.4*   < > 8.4* 8.1* 8.4*  HCT 28.4*   < > 26.7* 26.0* 27.5*  MCV 90.2   < > 97.8  97.4 98.9  PLT 169   < > 159 152 173   < > = values in this interval not displayed.   Lab Results  Component Value Date   TSH 4.178 12/30/2020   Lab Results  Component Value Date   HGBA1C 5.7 (H) 12/08/2019   No results found for: CHOL, HDL, LDLCALC, LDLDIRECT, TRIG, CHOLHDL  Significant Diagnostic Results in last 30 days:  IR Fluoro Guide CV Line Left  Result Date: 12/20/2020 INDICATION: 87 year old male with history of worsening azotemia. EXAM: NON-TUNNELED CENTRAL VENOUS HEMODIALYSIS CATHETER PLACEMENT WITH ULTRASOUND AND FLUOROSCOPIC GUIDANCE COMPARISON:  None. MEDICATIONS:  None FLUOROSCOPY TIME:  minutes,   seconds (  mGy) COMPLICATIONS: None immediate. PROCEDURE: Informed written consent was obtained from the patient after a discussion of the risks, benefits, and alternatives to treatment. Questions regarding the procedure were encouraged and answered. Preprocedure ultrasound evaluation demonstrated occlusion and possible variant anatomy of the right internal jugular vein, without safe access point for central line insertion. Therefore, the left neck and chest were prepped with chlorhexidine in a sterile fashion, and a sterile drape was applied covering the operative field. Maximum barrier sterile technique with sterile gowns and gloves were used for the procedure. A timeout was performed prior to the initiation of the procedure. After the overlying soft tissues were anesthetized, a small venotomy incision was created and a micropuncture kit was utilized to access the internal jugular vein. Real-time ultrasound guidance was utilized for vascular access including the acquisition of a permanent ultrasound image documenting patency of the accessed vessel. The microwire was utilized to measure appropriate catheter length. A guidewire was advanced to the level of the IVC. Under fluoroscopic guidance, the venotomy was serially dilated, ultimately allowing placement of a 24 cm temporary Trialysis  catheter with tip ultimately terminating within the superior aspect of the right atrium. Final catheter positioning was confirmed and documented with a spot radiographic image. The catheter aspirates and flushes normally. The catheter was flushed with appropriate volume heparin dwells. The catheter exit site was secured with a 0 silk retention suture. A dressing was placed. The patient tolerated the procedure well without immediate post procedural complication. IMPRESSION: Successful placement of a left internal jugular approach 24 cm temporary dialysis catheter with tip terminating with in the superior aspect of the right atrium. The catheter is ready for immediate use. PLAN: This catheter may be converted to a tunneled dialysis catheter at a later date as indicated. [name], MD [facility] Radiology Electronically Signed   By: [name] MD   On: 12/20/2020 11:11   IR US Guide Vasc Access Left  Result Date: 12/20/2020 INDICATION: 87 year old male with history of worsening azotemia. EXAM: NON-TUNNELED CENTRAL VENOUS HEMODIALYSIS CATHETER PLACEMENT WITH ULTRASOUND AND FLUOROSCOPIC GUIDANCE COMPARISON:  None. MEDICATIONS: None FLUOROSCOPY TIME:  minutes,   seconds (  mGy) COMPLICATIONS: None immediate. PROCEDURE: Informed written consent was obtained from the patient after a discussion of the risks, benefits, and alternatives to treatment. Questions regarding the procedure were encouraged and answered. Preprocedure ultrasound evaluation demonstrated occlusion and possible variant anatomy of the right internal jugular vein, without safe access point for central line insertion. Therefore, the left neck and chest were prepped with chlorhexidine in a sterile fashion, and a sterile drape was applied covering the operative field. Maximum barrier sterile technique with sterile gowns and gloves were used for the procedure. A timeout was performed prior to the  initiation of the procedure. After the overlying soft tissues were anesthetized, a small venotomy incision was created and a micropuncture kit was utilized to access the internal jugular vein. Real-time ultrasound guidance was utilized for vascular access including the acquisition of a permanent ultrasound image documenting patency of the accessed vessel. The microwire was utilized to measure appropriate catheter length. A guidewire was advanced to the level of the IVC. Under fluoroscopic guidance, the venotomy was serially dilated, ultimately allowing placement of a 24 cm temporary Trialysis catheter with tip ultimately terminating within the superior aspect of the right atrium. Final catheter positioning was confirmed and documented with a spot radiographic image. The catheter  aspirates and flushes normally. The catheter was flushed with appropriate volume heparin dwells. The catheter exit site was secured with a 0 silk retention suture. A dressing was placed. The patient tolerated the procedure well without immediate post procedural complication. IMPRESSION: Successful placement of a left internal jugular approach 24 cm temporary dialysis catheter with tip terminating with in the superior aspect of the right atrium. The catheter is ready for immediate use. PLAN: This catheter may be converted to a tunneled dialysis catheter at a later date as indicated. [name], MD [facility] Radiology Electronically Signed   By: [name] MD   On: 12/20/2020 11:11   IR US Guide Vasc Access Right  Result Date: 01/04/2021 INDICATION: 87 year old with renal failure and needs a tunneled dialysis catheter. EXAM: FLUOROSCOPIC AND ULTRASOUND GUIDED PLACEMENT OF A TUNNELED DIALYSIS CATHETER Physician: Stephan Minister. Anselm Pancoast, MD MEDICATIONS: Ancef 2 g; The antibiotic was administered within an appropriate time interval prior to skin puncture. ANESTHESIA/SEDATION: Versed 0.5 mg IV;  Fentanyl 25 mcg IV; Moderate Sedation Time:  20 minutes The patient was continuously monitored during the procedure by the interventional radiology nurse under my direct supervision. FLUOROSCOPY TIME:  Fluoroscopy Time: 24 seconds, 1 mGy COMPLICATIONS: None immediate. PROCEDURE: Informed consent was obtained for placement of a tunneled dialysis catheter. The patient was placed supine on the interventional table. Ultrasound confirmed a patent right internal jugular vein. Ultrasound images were obtained for documentation. The right neck and chest was prepped and draped in a sterile fashion. Maximal barrier sterile technique was utilized including caps, mask, sterile gowns, sterile gloves, sterile drape, hand hygiene and skin antiseptic. The right neck was anesthetized with 1% lidocaine. A small incision was made with #11 blade scalpel. A 21 gauge needle directed into the right internal jugular vein with ultrasound guidance. A micropuncture dilator set was placed. A 23 cm tip to cuff Palindrome catheter was selected. The skin below the right clavicle was anesthetized and a small incision was made with an #11 blade scalpel. A subcutaneous tunnel was formed to the vein dermatotomy site. The catheter was brought through the tunnel. The vein dermatotomy site was dilated to accommodate a peel-away sheath. The catheter was placed through the peel-away sheath and directed into the central venous structures. The tip of the catheter was placed at the superior cavoatrial junction with fluoroscopy. Fluoroscopic images were obtained for documentation. Both lumens were found to aspirate and flush well. The proper amount of heparin was flushed in both lumens. The vein dermatotomy site was closed using a single layer of absorbable suture and Dermabond. The catheter was secured to the skin using Prolene suture. IMPRESSION: Successful placement of a right jugular tunneled dialysis catheter using ultrasound and fluoroscopic guidance.  Electronically Signed   By: Markus Daft M.D.   On: 01/04/2021 20:02   DG CHEST PORT 1 VIEW  Result Date: 12/24/2020 CLINICAL DATA:  Central line placement. EXAM: PORTABLE CHEST 1 VIEW COMPARISON:  Dec 17, 2020. FINDINGS: Stable cardiomegaly. Status post coronary bypass graft. Stable position stent graft involving descending thoracic aorta. Interval placement of left internal jugular dialysis catheter with distal tip in expected position of cavoatrial junction. No pneumothorax is noted. Mild bibasilar atelectasis or infiltrates are noted. Bony thorax is unremarkable. IMPRESSION: Interval placement of left internal jugular dialysis catheter with distal tip in expected position of cavoatrial junction. Mild bibasilar atelectasis or infiltrates are noted. Electronically Signed   By: Marijo Conception M.D.   On: 12/24/2020  15:19   DG CHEST PORT 1 VIEW  Result Date: 12/17/2020 CLINICAL DATA:  History of aortic stent graft repair with persistent cough and hypoxia EXAM: PORTABLE CHEST 1 VIEW COMPARISON:  12/10/2020 FINDINGS: Cardiac shadow is enlarged but stable. Postsurgical changes are noted. Thoracic aortic stent graft is again seen in the descending thoracic aorta stable from the prior exam. Lungs are well aerated bilaterally. Mild atelectatic changes are noted in the right base as well as in the left mid lung. No sizable effusion is noted. No bony abnormality is seen. IMPRESSION: Mild bilateral atelectatic changes. Electronically Signed   By: Inez Catalina M.D.   On: 12/17/2020 15:12   ECHOCARDIOGRAM COMPLETE  Result Date: 12/16/2020    ECHOCARDIOGRAM REPORT   Patient Name:   Andrew [name] Date of Exam: 12/16/2020 Medical Rec #:  [id number]       Height:       68.0 in Accession #:    [id number]      Weight:       153.7 lb Date of Birth:  September 07, 1931       BSA:          1.827 m Patient Age:    87 years        BP:           137/83 mmHg Patient Gender: M               HR:           68 bpm. Exam Location:  Inpatient  Procedure: 2D Echo Indications:    congestive heart failure  History:        Patient has prior history of Echocardiogram examinations, most                 recent 01/15/2020. CAD, Abnormal ECG, aortic aneurysm.                 pneumnonia.; Risk Factors:Hypertension.  Sonographer:    Johny Chess Referring Phys: [id number] Patrecia Pour  Sonographer Comments: Image acquisition challenging due to respiratory motion and Image acquisition challenging due to uncooperative patient. IMPRESSIONS  1. Left ventricular ejection fraction, by estimation, is 60 to 65%. The left ventricle has normal function. The left ventricle has no regional wall motion abnormalities. Left ventricular diastolic function could not be evaluated.  2. Right ventricular systolic function is normal. The right ventricular size is normal. There is normal pulmonary artery systolic pressure. The estimated right ventricular systolic pressure is 56.9 mmHg.  3. Left atrial size was moderately dilated.  4. The mitral valve is degenerative. Trivial mitral valve regurgitation. Mild mitral stenosis. The mean mitral valve gradient is 3.3 mmHg. Moderate mitral annular calcification.  5. The aortic valve is calcified. There is moderate calcification of the aortic valve. There is moderate thickening of the aortic valve. Aortic valve regurgitation is not visualized. Mild to moderate aortic valve sclerosis/calcification is present, without any evidence of aortic stenosis. Comparison(s): No significant change from prior study. FINDINGS  Left Ventricle: Left ventricular ejection fraction, by estimation, is 60 to 65%. The left ventricle has normal function. The left ventricle has no regional wall motion abnormalities. The left ventricular internal cavity size was normal in size. There is  no left ventricular hypertrophy. Left ventricular diastolic function could not be evaluated due to mitral annular calcification (moderate or greater). Left ventricular diastolic function  could not be evaluated. Right Ventricle: The right ventricular size is normal. No increase in right ventricular wall thickness.  Right ventricular systolic function is normal. There is normal pulmonary artery systolic pressure. The tricuspid regurgitant velocity is 2.77 m/s, and  with an assumed right atrial pressure of 3 mmHg, the estimated right ventricular systolic pressure is 45.3 mmHg. Left Atrium: Left atrial size was moderately dilated. Right Atrium: Right atrial size was normal in size. Pericardium: There is no evidence of pericardial effusion. Mitral Valve: The mitral valve is degenerative in appearance. Moderate mitral annular calcification. Trivial mitral valve regurgitation. Mild mitral valve stenosis. The mean mitral valve gradient is 3.3 mmHg. Tricuspid Valve: The tricuspid valve is grossly normal. Tricuspid valve regurgitation is mild . No evidence of tricuspid stenosis. Aortic Valve: The aortic valve is calcified. There is moderate calcification of the aortic valve. There is moderate thickening of the aortic valve. Aortic valve regurgitation is not visualized. Mild to moderate aortic valve sclerosis/calcification is present, without any evidence of aortic stenosis. Aortic valve mean gradient measures 4.4 mmHg. Aortic valve peak gradient measures 10.3 mmHg. Aortic valve area, by VTI measures 1.76 cm. Pulmonic Valve: The pulmonic valve was grossly normal. Pulmonic valve regurgitation is not visualized. No evidence of pulmonic stenosis. Aorta: The aortic root and ascending aorta are structurally normal, with no evidence of dilitation. Venous: The inferior vena cava was not well visualized. IAS/Shunts: The atrial septum is grossly normal.  LEFT VENTRICLE PLAX 2D LVIDd:         4.60 cm LVIDs:         4.00 cm LV PW:         0.90 cm LV IVS:        1.00 cm LVOT diam:     1.80 cm LV SV:         55 LV SV Index:   30 LVOT Area:     2.54 cm  LEFT ATRIUM             Index       RIGHT ATRIUM           Index LA  diam:        3.70 cm 2.02 cm/m  RA Area:     16.50 cm LA Vol (A2C):   49.6 ml 27.15 ml/m RA Volume:   35.50 ml  19.43 ml/m LA Vol (A4C):   85.0 ml 46.52 ml/m LA Biplane Vol: 70.2 ml 38.42 ml/m  AORTIC VALVE AV Area (Vmax):    1.54 cm AV Area (Vmean):   1.68 cm AV Area (VTI):     1.76 cm AV Vmax:           160.17 cm/s AV Vmean:          98.149 cm/s AV VTI:            0.311 m AV Peak Grad:      10.3 mmHg AV Mean Grad:      4.4 mmHg LVOT Vmax:         96.96 cm/s LVOT Vmean:        64.748 cm/s LVOT VTI:          0.215 m LVOT/AV VTI ratio: 0.69  AORTA Ao Root diam: 3.60 cm Ao Asc diam:  2.90 cm MITRAL VALVE           TRICUSPID VALVE MV Area VTI:  1.69 cm TR Peak grad:   30.7 mmHg MV Mean grad: 3.3 mmHg TR Vmax:        277.00 cm/s MV VTI:       0.32 m  SHUNTS                        Systemic VTI:  0.22 m                        Systemic Diam: 1.80 cm Eleonore Chiquito MD Electronically signed by Eleonore Chiquito MD Signature Date/Time: 12/16/2020/4:25:14 PM    Final    IR TUNNELED CENTRAL VENOUS CATHETER PLACEMENT  Result Date: 01/04/2021 INDICATION: 87 year old with renal failure and needs a tunneled dialysis catheter. EXAM: FLUOROSCOPIC AND ULTRASOUND GUIDED PLACEMENT OF A TUNNELED DIALYSIS CATHETER Physician: Stephan Minister. Anselm Pancoast, MD MEDICATIONS: Ancef 2 g; The antibiotic was administered within an appropriate time interval prior to skin puncture. ANESTHESIA/SEDATION: Versed 0.5 mg IV; Fentanyl 25 mcg IV; Moderate Sedation Time:  20 minutes The patient was continuously monitored during the procedure by the interventional radiology nurse under my direct supervision. FLUOROSCOPY TIME:  Fluoroscopy Time: 24 seconds, 1 mGy COMPLICATIONS: None immediate. PROCEDURE: Informed consent was obtained for placement of a tunneled dialysis catheter. The patient was placed supine on the interventional table. Ultrasound confirmed a patent right internal jugular vein. Ultrasound images were obtained for documentation.  The right neck and chest was prepped and draped in a sterile fashion. Maximal barrier sterile technique was utilized including caps, mask, sterile gowns, sterile gloves, sterile drape, hand hygiene and skin antiseptic. The right neck was anesthetized with 1% lidocaine. A small incision was made with #11 blade scalpel. A 21 gauge needle directed into the right internal jugular vein with ultrasound guidance. A micropuncture dilator set was placed. A 23 cm tip to cuff Palindrome catheter was selected. The skin below the right clavicle was anesthetized and a small incision was made with an #11 blade scalpel. A subcutaneous tunnel was formed to the vein dermatotomy site. The catheter was brought through the tunnel. The vein dermatotomy site was dilated to accommodate a peel-away sheath. The catheter was placed through the peel-away sheath and directed into the central venous structures. The tip of the catheter was placed at the superior cavoatrial junction with fluoroscopy. Fluoroscopic images were obtained for documentation. Both lumens were found to aspirate and flush well. The proper amount of heparin was flushed in both lumens. The vein dermatotomy site was closed using a single layer of absorbable suture and Dermabond. The catheter was secured to the skin using Prolene suture. IMPRESSION: Successful placement of a right jugular tunneled dialysis catheter using ultrasound and fluoroscopic guidance. Electronically Signed   By: Markus Daft M.D.   On: 01/04/2021 20:02    Assessment/Plan 1. Acute kidney injury ([place]) - 05/20 creat 9.43, creat 4.79 06/20 - followed by nephrology - remains nonoliguric - plan to trial hemodialysis, scheduled TTS - palliative consulted  2. Benign prostatic hyperplasia with urinary retention - failed voiding trial in hospital, foley reinserted 06/11 - urine yellow with sediment - cont foley care and changes per facility - cont cranberry supplement for UTI prevention  3. Coronary  artery disease involving native heart, unspecified vessel or lesion type, unspecified whether angina present - cont asa and statin - Imdur and nitroglycerin discontinued during hospitalization  4. Thoracic aortic aneurysm without rupture (HCC) - stable  5. AAA (abdominal aortic aneurysm) without rupture ([place]) - followed by Dr. Carlis Abbott- next f/u 07/26  6. Essential hypertension - controlled - bp meds d/c due to hypotension associated with hemodialysis  7. Other iron deficiency anemia -d/t renal failure - cont ferrous  sulfate 325 mg qam  8. Hemodialysis-associated hypotension - cont midodrine 5 mg tid prn for SBP < 120  9. Weakness - ambulated with walker prior to hospitalization - PT/OT  10. Dry skin - small scabs on head and extremities - suspect due to dry skin - advised wife to being his Cerave cream from home- apply after bathing and daily    Family/ staff Communication: plan discussed with patient and nurse  Labs/tests ordered:

## 2021-01-13 ENCOUNTER — Encounter: Payer: Self-pay | Admitting: Internal Medicine

## 2021-01-13 ENCOUNTER — Other Ambulatory Visit: Payer: Self-pay | Admitting: Physician Assistant

## 2021-01-13 NOTE — Progress Notes (Signed)
Provider:  Veleta Miners MD Location:   [facility] Room Number: 35 Place of Service:  SNF (31)  PCP: [name], MD Patient Care Team: [name], MD as PCP - General (Family Medicine) Debara Pickett Nadean Corwin, MD as PCP - Cardiology (Cardiology)  Extended Emergency Contact Information Primary Emergency Contact: Wylene Simmer Address: [address] [place]          [place] Phone: [phone] Mobile Phone: [phone] Relation: Spouse Secondary Emergency Contact: [place] Mobile Phone: [phone] Relation: Daughter  Code Status: DNR Goals of Care: Advanced Directive information Advanced Directives 01/13/2021  Does Patient Have a Medical Advance Directive? Yes  Type of Advance Directive Living will;Healthcare Power of Attorney  Does patient want to make changes to medical advance directive? No - Patient declined  Copy of [place] in Chart? Yes - validated most recent copy scanned in chart (See row information)  Would patient like information on creating a medical advance directive? Yes (Inpatient - patient defers creating a medical advance directive and declines information at this time)      Chief Complaint  Patient presents with   New Admit To SNF    Admission to SNF    HPI: Patient is a 87 y.o. 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 bph, pt self catheterizes himself on schedule   Complication of anesthesia    had to have Foley post op cabg and lumb lam   Coronary artery disease    s/p remote PCI // s/p CABG in 2010 Echo 01/2019: EF 60-65, Gr 1 DD, normal RVSF, RVSP 36, mild LAE, mild to mod TR   Hypertension    PVC's (premature ventricular  contractions)    Monitor 12/2018:  Sinus rhythm with frequent PVC's (11.5%) and periods of bigeminy.    Thoracic aortic aneurysm ([place])    09/29/16 CT: 4.3 cm ascending, 4.5 cm descending. 1 yr f/u rec // s/p endovascular repair 09/2019 (Dr. Carlis Abbott)   Urinary retention    Wears glasses    Past Surgical History:  Procedure Laterality Date   BACK SURGERY  2012   lumb lam   CARDIAC CATHETERIZATION     2010-per patient   COLONOSCOPY     CORONARY ARTERY BYPASS GRAFT  2010   triple   EPIGASTRIC HERNIA REPAIR N/A 09/11/2017   Procedure: HERNIA REPAIR EPIGASTRIC ADULT;  Surgeon: Rolm Bookbinder, MD;  Location: [facility];  Service: General;  Laterality: N/A;   HERNIA REPAIR     [facility] [place]  09/11/2017   with mesh   INGUINAL HERNIA REPAIR  07/30/2012   Procedure: HERNIA REPAIR INGUINAL ADULT;  Surgeon: Rolm Bookbinder, MD;  Location: [facility];  Service: General;  Laterality: Left;  Left Hernia Site   INSERTION OF DIALYSIS CATHETER N/A 12/24/2020   Procedure: INSERTION OF DIALYSIS CATHETER;  Surgeon: Kipp Brood, MD;  Location: [facility];  Service: Pulmonary;  Laterality: N/A;   INSERTION OF MESH  07/30/2012   Procedure: INSERTION OF MESH;  Surgeon: Rolm Bookbinder, MD;  Location: [facility];  Service: General;  Laterality: Left;  Left Inguinal Hernia  INSERTION OF MESH N/A 09/11/2017   Procedure: INSERTION OF MESH;  Surgeon: Rolm Bookbinder, MD;  Location: [facility];  Service: General;  Laterality: N/A;   IR FLUORO GUIDE CV LINE LEFT  12/20/2020   IR PERC TUN PERIT CATH WO PORT S&I /IMAG  01/04/2021   IR US GUIDE VASC ACCESS LEFT  12/20/2020   IR US GUIDE VASC ACCESS RIGHT  01/04/2021   LAPAROSCOPY N/A 09/11/2017   Procedure: DIAGNOSTIC LAPAOSCOPY;  Surgeon: Rolm Bookbinder, MD;  Location: [facility];  Service: General;  Laterality: N/A;  ERAS pathway   SPINE SURGERY     lumbar laminectomy   THORACIC AORTIC ENDOVASCULAR STENT GRAFT N/A 09/22/2019    Procedure: THORACIC AORTIC ENDOVASCULAR STENT GRAFT;  Surgeon: Marty Heck, MD;  Location: [facility];  Service: Vascular;  Laterality: N/A;   TONSILLECTOMY      reports that he quit smoking about 36 years ago. His smoking use included cigarettes. He has a 8.00 pack-year smoking history. He has never used smokeless tobacco. He reports that he does not drink alcohol and does not use drugs. Social History   Socioeconomic History   Marital status: Married    Spouse name: [name]   Number of children: Not on file   Years of education: Not on file   Highest education level: Not on file  Occupational History    Comment: retired  Tobacco Use   Smoking status: Former    Packs/day: 1.00    Years: 8.00    Pack years: 8.00    Types: Cigarettes    Quit date: 07/24/1984    Years since quitting: 36.4   Smokeless tobacco: Never  Vaping Use   Vaping Use: Never used  Substance and Sexual Activity   Alcohol use: No   Drug use: No   Sexual activity: Not on file  Other Topics Concern   Not on file  Social History Narrative   12/08/19 lives with wife   Social Determinants of Health   Financial Resource Strain: Not on file  Food Insecurity: Not on file  Transportation Needs: Not on file  Physical Activity: Not on file  Stress: Not on file  Social Connections: Not on file  Intimate Partner Violence: Not on file    Functional Status Survey:    Family History  Problem Relation Age of Onset   Allergic rhinitis Son    Coronary artery disease Mother    Angioedema Neg Hx    Asthma Neg Hx    Atopy Neg Hx    Eczema Neg Hx    Immunodeficiency Neg Hx    Urticaria Neg Hx     Health Maintenance  Topic Date Due   Zoster Vaccines- Shingrix (1 of 2) Never done   PNA vac Low Risk Adult (2 of 2 - PPSV23) 05/10/2016   COVID-19 Vaccine (4 - Booster) 11/29/2019   INFLUENZA VACCINE  02/21/2021   TETANUS/TDAP  05/28/2022   HPV VACCINES  Aged Out    Allergies  Allergen Reactions   Quinolones  Other (See Comments)    Patient has an abdominal aortic aneurysm    Naltrexone     Other reaction(s): GI Upset (intolerance), NVD    Allergies as of 01/13/2021       Reactions   Quinolones Other (See Comments)   Patient has an abdominal aortic aneurysm    Naltrexone    Other reaction(s): GI Upset (intolerance), NVD        Medication List  Accurate as of January 13, 2021 11:19 AM. If you have any questions, ask your nurse or doctor.          aspirin 81 MG tablet Take 81 mg by mouth daily after supper.   atorvastatin 20 MG tablet Commonly known as: LIPITOR TAKE 1 TABLET ONCE DAILY AT 6 PM.   Co-Enzyme Q-10 100 MG Caps Take 100 mg by mouth daily.   CRANBERRY EXTRACT PO Take 1 tablet by mouth daily.   ferrous sulfate 325 (65 FE) MG tablet Take 325 mg by mouth daily with breakfast.   loratadine 10 MG tablet Commonly known as: CLARITIN Take 10 mg by mouth daily as needed for allergies or rhinitis.   midodrine 5 MG tablet Commonly known as: PROAMATINE Take 1 tablet (5 mg total) by mouth 3 (three) times daily as needed (SBP <120).   Nasacort Allergy 24HR 55 MCG/ACT Aero nasal inhaler Generic drug: triamcinolone Place 2 sprays into the nose daily as needed (for allergies).   oxymetazoline 0.05 % nasal spray Commonly known as: AFRIN Place 1 spray into both nostrils at bedtime as needed for congestion.   polyethylene glycol 17 g packet Commonly known as: MIRALAX / GLYCOLAX Take 17 g by mouth daily as needed for moderate constipation.   PRESERVISION AREDS 2 PO Take 1 capsule by mouth 2 (two) times daily.   sodium chloride 5 % ophthalmic ointment Commonly known as: MURO 270 Place 1 application into both eyes at bedtime.   TURMERIC PO Take 1 capsule by mouth daily after breakfast.   vitamin B-12 1000 MCG tablet Commonly known as: CYANOCOBALAMIN Take 1,000 mcg by mouth daily.   vitamin C 250 MG tablet Commonly known as: ASCORBIC ACID Take 250 mg by  mouth daily.   Vitamin D3 25 MCG (1000 UT) Caps Take 1,000 Units by mouth daily.   zinc oxide 20 % ointment Apply 1 application topically as needed for irritation.        Review of Systems  Vitals:   01/13/21 1111  BP: (!) 160/77  Pulse: 66  Resp: 18  Temp: 97.8 F (36.6 C)  SpO2: 94%  Weight: 138 lb 1.6 oz (62.6 kg)  Height: 5\' 8"  (1.727 m)   Body mass index is 21 kg/m. Physical Exam  Labs reviewed: Basic Metabolic Panel: Recent Labs    01/01/21 0349 01/02/21 0327 01/03/21 0434 01/04/21 0044 01/08/21 0527 01/09/21 0152 01/10/21 0440  NA 139 138 140   < > 138 137 138  K 3.7 4.2 4.0   < > 3.3* 3.9 3.6  CL 102 102 103   < > 100 100 103  CO2 24 27 26    < > 25 27 25   GLUCOSE 88 92 83   < > 75 112* 90  BUN 64* 33* 55*   < > 69* 40* 66*  CREATININE 5.21* 3.30* 4.39*   < > 4.65* 3.40* 4.79*  CALCIUM 8.1* 8.3* 8.5*   < > 8.2* 8.2* 8.1*  MG 1.8 1.7 1.9  --   --   --   --   PHOS 6.7* 4.3 6.2*   < > 5.5* 3.5 5.2*   < > = values in this interval not displayed.   Liver Function Tests: Recent Labs    12/10/20 1713 12/11/20 1209 01/08/21 0527 01/09/21 0152 01/10/21 0440  AST 12*  --   --   --   --   ALT 9  --   --   --   --  ALKPHOS 66  --   --   --   --   BILITOT 0.3  --   --   --   --   PROT 7.3  --   --   --   --   ALBUMIN 3.5   < > 2.6* 2.4* 2.4*   < > = values in this interval not displayed.   No results for input(s): LIPASE, AMYLASE in the last 8760 hours. No results for input(s): AMMONIA in the last 8760 hours. CBC: Recent Labs    12/10/20 1713 12/10/20 2323 01/06/21 0302 01/07/21 0248 01/08/21 0527  WBC 11.8*   < > 9.2 13.5* 11.8*  NEUTROABS 10.0*  --   --   --   --   HGB 9.4*   < > 8.4* 8.1* 8.4*  HCT 28.4*   < > 26.7* 26.0* 27.5*  MCV 90.2   < > 97.8 97.4 98.9  PLT 169   < > 159 152 173   < > = values in this interval not displayed.   Cardiac Enzymes: Recent Labs    12/10/20 1852  CKTOTAL 66   BNP: Invalid input(s): POCBNP Lab  Results  Component Value Date   HGBA1C 5.7 (H) 12/08/2019   Lab Results  Component Value Date   TSH 4.178 12/30/2020   Lab Results  Component Value Date   VITAMINB12 2,201 (H) 12/10/2020   Lab Results  Component Value Date   FOLATE 17.6 12/10/2020   Lab Results  Component Value Date   IRON 26 (L) 12/10/2020   TIBC 232 (L) 12/10/2020   FERRITIN 307 12/10/2020    Imaging and Procedures obtained prior to SNF admission: CT ABDOMEN PELVIS WO CONTRAST  Result Date: 12/10/2020 CLINICAL DATA:  Hematuria, cough.  Shortness of breath. EXAM: CT CHEST, ABDOMEN AND PELVIS WITHOUT CONTRAST TECHNIQUE: Multidetector CT imaging of the chest, abdomen and pelvis was performed following the standard protocol without IV contrast. COMPARISON:  July 01, 2020. FINDINGS: CT CHEST FINDINGS Cardiovascular: Status post stent graft repair of transverse aortic arch and descending thoracic aorta. Status post coronary bypass graft. Normal cardiac size. No pericardial effusion. Stable aneurysmal dilatation of distal descending thoracic aorta measuring 5.5 cm. Mediastinum/Nodes: No enlarged mediastinal, hilar, or axillary lymph nodes. Thyroid gland, trachea, and esophagus demonstrate no significant findings. Lungs/Pleura: No pneumothorax is noted. Stable probable scarring is noted in the right lower lobe. New airspace opacity is noted in left upper lobe concerning for pneumonia. Musculoskeletal: No chest wall mass or suspicious bone lesions identified. CT ABDOMEN PELVIS FINDINGS Hepatobiliary: No focal liver abnormality is seen. No gallstones, gallbladder wall thickening, or biliary dilatation. Pancreas: Unremarkable. No pancreatic ductal dilatation or surrounding inflammatory changes. Spleen: Normal in size without focal abnormality. Adrenals/Urinary Tract: Adrenal glands are unremarkable. Kidneys are normal, without renal calculi, focal lesion, or hydronephrosis. Bladder is unremarkable. Stomach/Bowel: The stomach  appears normal. There is no evidence of bowel obstruction or inflammation. Vascular/Lymphatic: Aneurysmal dilatation of abdominal aorta is noted, with maximum measured diameter 5.1 cm which is not significantly changed compared to prior exam. No adenopathy is noted. Reproductive: Prostate is unremarkable. Other: No abdominal wall hernia or abnormality. No abdominopelvic ascites. Musculoskeletal: No acute or significant osseous findings. IMPRESSION: New left upper lobe airspace opacity is noted concerning for possible pneumonia. Status post stent graft repair of descending thoracic aortic aneurysm, which has a maximum measured diameter 5.5 cm distally which is unchanged compared to prior exam. Grossly stable 5.1 cm infrarenal abdominal aortic aneurysm is  noted. Recommend follow-up CT/MR every 6 months and vascular consultation. This recommendation follows ACR consensus guidelines: White Paper of the ACR Incidental Findings Committee II on Vascular Findings. J Am Coll Radiol 2013; 10:789-794. Aortic Atherosclerosis (ICD10-I70.0). Electronically Signed   By: Marijo Conception M.D.   On: 12/10/2020 19:34   CT Chest Wo Contrast  Result Date: 12/10/2020 CLINICAL DATA:  Hematuria, cough.  Shortness of breath. EXAM: CT CHEST, ABDOMEN AND PELVIS WITHOUT CONTRAST TECHNIQUE: Multidetector CT imaging of the chest, abdomen and pelvis was performed following the standard protocol without IV contrast. COMPARISON:  July 01, 2020. FINDINGS: CT CHEST FINDINGS Cardiovascular: Status post stent graft repair of transverse aortic arch and descending thoracic aorta. Status post coronary bypass graft. Normal cardiac size. No pericardial effusion. Stable aneurysmal dilatation of distal descending thoracic aorta measuring 5.5 cm. Mediastinum/Nodes: No enlarged mediastinal, hilar, or axillary lymph nodes. Thyroid gland, trachea, and esophagus demonstrate no significant findings. Lungs/Pleura: No pneumothorax is noted. Stable probable  scarring is noted in the right lower lobe. New airspace opacity is noted in left upper lobe concerning for pneumonia. Musculoskeletal: No chest wall mass or suspicious bone lesions identified. CT ABDOMEN PELVIS FINDINGS Hepatobiliary: No focal liver abnormality is seen. No gallstones, gallbladder wall thickening, or biliary dilatation. Pancreas: Unremarkable. No pancreatic ductal dilatation or surrounding inflammatory changes. Spleen: Normal in size without focal abnormality. Adrenals/Urinary Tract: Adrenal glands are unremarkable. Kidneys are normal, without renal calculi, focal lesion, or hydronephrosis. Bladder is unremarkable. Stomach/Bowel: The stomach appears normal. There is no evidence of bowel obstruction or inflammation. Vascular/Lymphatic: Aneurysmal dilatation of abdominal aorta is noted, with maximum measured diameter 5.1 cm which is not significantly changed compared to prior exam. No adenopathy is noted. Reproductive: Prostate is unremarkable. Other: No abdominal wall hernia or abnormality. No abdominopelvic ascites. Musculoskeletal: No acute or significant osseous findings. IMPRESSION: New left upper lobe airspace opacity is noted concerning for possible pneumonia. Status post stent graft repair of descending thoracic aortic aneurysm, which has a maximum measured diameter 5.5 cm distally which is unchanged compared to prior exam. Grossly stable 5.1 cm infrarenal abdominal aortic aneurysm is noted. Recommend follow-up CT/MR every 6 months and vascular consultation. This recommendation follows ACR consensus guidelines: White Paper of the ACR Incidental Findings Committee II on Vascular Findings. J Am Coll Radiol 2013; 10:789-794. Aortic Atherosclerosis (ICD10-I70.0). Electronically Signed   By: Marijo Conception M.D.   On: 12/10/2020 19:34   DG Chest Port 1 View  Result Date: 12/10/2020 CLINICAL DATA:  Cough and shortness of breath for 2 weeks EXAM: PORTABLE CHEST 1 VIEW COMPARISON:  07/01/2020  FINDINGS: Cardiac shadow is enlarged but stable. Diffuse aortic stent graft is noted in the descending thoracic aorta. Postsurgical changes are again noted and stable. Chronic pleural thickening laterally in the right lung base is noted. No focal infiltrate or sizable effusion is seen. No bony abnormality is seen. Stable calcified pleural plaques are noted. IMPRESSION: Stable appearance of the chest as described. Electronically Signed   By: Inez Catalina M.D.   On: 12/10/2020 16:58    Assessment/Plan There are no diagnoses linked to this encounter.   Family/ staff Communication:   Labs/tests ordered:

## 2021-01-20 ENCOUNTER — Non-Acute Institutional Stay (SKILLED_NURSING_FACILITY): Payer: Medicare Other | Admitting: Internal Medicine

## 2021-01-20 ENCOUNTER — Encounter: Payer: Self-pay | Admitting: Internal Medicine

## 2021-01-20 DIAGNOSIS — I10 Essential (primary) hypertension: Secondary | ICD-10-CM

## 2021-01-20 DIAGNOSIS — I714 Abdominal aortic aneurysm, without rupture, unspecified: Secondary | ICD-10-CM

## 2021-01-20 DIAGNOSIS — N179 Acute kidney failure, unspecified: Secondary | ICD-10-CM

## 2021-01-20 DIAGNOSIS — N401 Enlarged prostate with lower urinary tract symptoms: Secondary | ICD-10-CM | POA: Diagnosis not present

## 2021-01-20 DIAGNOSIS — D508 Other iron deficiency anemias: Secondary | ICD-10-CM

## 2021-01-20 DIAGNOSIS — I2510 Atherosclerotic heart disease of native coronary artery without angina pectoris: Secondary | ICD-10-CM | POA: Diagnosis not present

## 2021-01-20 DIAGNOSIS — R531 Weakness: Secondary | ICD-10-CM

## 2021-01-20 DIAGNOSIS — R338 Other retention of urine: Secondary | ICD-10-CM

## 2021-01-20 DIAGNOSIS — I953 Hypotension of hemodialysis: Secondary | ICD-10-CM

## 2021-01-20 DIAGNOSIS — D649 Anemia, unspecified: Secondary | ICD-10-CM

## 2021-01-20 NOTE — Progress Notes (Signed)
Provider:  Veleta Miners MD  Location:   [facility] Room Number: 35 Place of Service:  SNF (31)  PCP: [name], MD Patient Care Team: [name], MD as PCP - General (Family Medicine) Debara Pickett Nadean Corwin, MD as PCP - Cardiology (Cardiology)  Extended Emergency Contact Information Primary Emergency Contact: Wylene Simmer Address: [address]          [place] Phone: [phone] Mobile Phone: [phone] Relation: Spouse Secondary Emergency Contact: [place] Mobile Phone: [phone] Relation: Daughter  Code Status: DNR Goals of Care: Advanced Directive information Advanced Directives 01/20/2021  Does Patient Have a Medical Advance Directive? Yes  Type of Advance Directive Living will;Healthcare Power of Attorney  Does patient want to make changes to medical advance directive? No - Patient declined  Copy of [employer] in Chart? Yes - validated most recent copy scanned in chart (See row information)  Would patient like information on creating a medical advance directive? Yes (Inpatient - patient defers creating a medical advance directive and declines information at this time)      Chief Complaint  Patient presents with   New Admit To SNF    Admission to SNF    HPI: Patient is a 87 y.o. male seen today for admission to SNF  Admitted to Hospital from 5/20-6/20 for Acute Renal Failure and Pneumonia  Patient has PMH of CAD s/p CABG in 2010 ,CKD, HTN Chronic Back Pain s/p Back Surgeries Remote h/o PAF HLD, Neuropathy BPH requiring Self Catherization due to Hypotonic Bladder S/p AAA Repair in 09/2019  He was admitted after his PCP did regular Labs which Showed Acute renal Failure In Hospital was found to have New Pneumonia. Was treated with Rocephin and Azithromycin and IV fluids But his renal Function did not Improve He is now on Dialysis. Per his Wife he was told that  most likely his Renal fucntion will recover after few months Patient came back from Dialysis and he was very weak and fatigued. Unable to talk much to me Per Nurses he is doing well other days. Even walking with mild assist He continues to have poor Appetite and has lost weight . 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 bph, pt self catheterizes himself on schedule   Complication of anesthesia    had to have Foley post op cabg and lumb lam   Coronary artery disease    s/p remote PCI // s/p CABG in 2010 Echo 01/2019: EF 60-65, Gr 1 DD, normal RVSF, RVSP 36, mild LAE, mild to mod TR   Hypertension    PVC's (premature ventricular contractions)    Monitor 12/2018:  Sinus rhythm with frequent PVC's (11.5%) and periods of bigeminy.    Thoracic aortic aneurysm ([place])    09/29/16 CT: 4.3 cm ascending, 4.5 cm descending. 1 yr f/u rec // s/p endovascular repair 09/2019 (Dr. Carlis Abbott)   Urinary retention    Wears glasses    Past Surgical History:  Procedure Laterality Date   BACK SURGERY  2012   lumb lam   CARDIAC CATHETERIZATION     2010-per patient   COLONOSCOPY     CORONARY ARTERY BYPASS GRAFT  2010   triple   EPIGASTRIC HERNIA REPAIR N/A 09/11/2017   Procedure: HERNIA REPAIR EPIGASTRIC ADULT;  Surgeon: Rolm Bookbinder, MD;  Location: [facility];  Service: General;  Laterality: N/A;   HERNIA REPAIR     [facility] [place]  09/11/2017   with mesh   INGUINAL HERNIA REPAIR  07/30/2012   Procedure: HERNIA REPAIR INGUINAL ADULT;  Surgeon: Rolm Bookbinder, MD;  Location: [facility];  Service: General;  Laterality: Left;  Left Hernia Site   INSERTION OF DIALYSIS CATHETER N/A 12/24/2020   Procedure: INSERTION OF DIALYSIS CATHETER;  Surgeon: Kipp Brood, MD;  Location: [facility];   Service: Pulmonary;  Laterality: N/A;   INSERTION OF MESH  07/30/2012   Procedure: INSERTION OF MESH;  Surgeon: Rolm Bookbinder, MD;  Location: [facility];  Service: General;  Laterality: Left;  Left Inguinal Hernia   INSERTION OF MESH N/A 09/11/2017   Procedure: INSERTION OF MESH;  Surgeon: Rolm Bookbinder, MD;  Location: [facility];  Service: General;  Laterality: N/A;   IR FLUORO GUIDE CV LINE LEFT  12/20/2020   IR PERC TUN PERIT CATH WO PORT S&I /IMAG  01/04/2021   IR US GUIDE VASC ACCESS LEFT  12/20/2020   IR US GUIDE [place] RIGHT  01/04/2021   LAPAROSCOPY N/A 09/11/2017   Procedure: DIAGNOSTIC LAPAOSCOPY;  Surgeon: Rolm Bookbinder, MD;  Location: [facility];  Service: General;  Laterality: N/A;  ERAS pathway   SPINE SURGERY     lumbar laminectomy   THORACIC AORTIC ENDOVASCULAR STENT GRAFT N/A 09/22/2019   Procedure: THORACIC AORTIC ENDOVASCULAR STENT GRAFT;  Surgeon: Marty Heck, MD;  Location: [facility];  Service: Vascular;  Laterality: N/A;   TONSILLECTOMY      reports that he quit smoking about 36 years ago. His smoking use included cigarettes. He has a 8.00 pack-year smoking history. He has never used smokeless tobacco. He reports that he does not drink alcohol and does not use drugs. Social History   Socioeconomic History   Marital status: Married    Spouse name: [name]   Number of children: Not on file   Years of education: Not on file   Highest education level: Not on file  Occupational History    Comment: retired  Tobacco Use   Smoking status: Former    Packs/day: 1.00    Years: 8.00    Pack years: 8.00    Types: Cigarettes    Quit date: 07/24/1984    Years since quitting: 36.5   Smokeless tobacco: Never  Vaping Use   Vaping Use: Never used  Substance and Sexual Activity   Alcohol use: No   Drug use: No   Sexual activity: Not on file  Other Topics Concern   Not on file  Social History Narrative   12/08/19 lives with wife   Social Determinants of  Health   Financial Resource Strain: Not on file  Food Insecurity: Not on file  Transportation Needs: Not on file  Physical Activity: Not on file  Stress: Not on file  Social Connections: Not on file  Intimate Partner Violence: Not on file    Functional Status Survey:    Family History  Problem Relation Age of Onset   Allergic rhinitis Son    Coronary artery disease Mother    Angioedema Neg Hx    Asthma Neg Hx    Atopy Neg Hx    Eczema Neg Hx    Immunodeficiency Neg Hx  Urticaria Neg Hx     Health Maintenance  Topic Date Due   Zoster Vaccines- Shingrix (1 of 2) Never done   PNA vac Low Risk Adult (2 of 2 - PPSV23) 05/10/2016   COVID-19 Vaccine (4 - Booster) 11/29/2019   INFLUENZA VACCINE  02/21/2021   TETANUS/TDAP  05/28/2022   HPV VACCINES  Aged Out    Allergies  Allergen Reactions   Quinolones Other (See Comments)    Patient has an abdominal aortic aneurysm    Naltrexone     Other reaction(s): GI Upset (intolerance), NVD    Allergies as of 01/20/2021       Reactions   Quinolones Other (See Comments)   Patient has an abdominal aortic aneurysm    Naltrexone    Other reaction(s): GI Upset (intolerance), NVD        Medication List        Accurate as of January 20, 2021  4:01 PM. If you have any questions, ask your nurse or doctor.          aspirin 81 MG tablet Take 81 mg by mouth daily after supper.   atorvastatin 20 MG tablet Commonly known as: LIPITOR TAKE 1 TABLET ONCE DAILY AT 6 PM.   Co-Enzyme Q-10 100 MG Caps Take 100 mg by mouth daily.   CRANBERRY EXTRACT PO Take 1 tablet by mouth daily.   ferrous sulfate 325 (65 FE) MG tablet Take 325 mg by mouth daily with breakfast.   loratadine 10 MG tablet Commonly known as: CLARITIN Take 10 mg by mouth daily as needed for allergies or rhinitis.   midodrine 5 MG tablet Commonly known as: PROAMATINE Take 1 tablet (5 mg total) by mouth 3 (three) times daily as needed (SBP <120).   Nasacort  Allergy 24HR 55 MCG/ACT Aero nasal inhaler Generic drug: triamcinolone Place 2 sprays into the nose daily as needed (for allergies).   oxymetazoline 0.05 % nasal spray Commonly known as: AFRIN Place 1 spray into both nostrils at bedtime as needed for congestion.   polyethylene glycol 17 g packet Commonly known as: MIRALAX / GLYCOLAX Take 17 g by mouth daily as needed for moderate constipation.   PRESERVISION AREDS 2 PO Take 1 capsule by mouth 2 (two) times daily.   simethicone 125 MG chewable tablet Commonly known as: MYLICON Chew 517 mg by mouth once. 2 capsules at bedtimes and 2 prior to test. Start taking on: February 14, 2021   sodium chloride 5 % ophthalmic ointment Commonly known as: MURO 616 Place 1 application into both eyes at bedtime.   Tubersol 5 UNIT/0.1ML injection Generic drug: tuberculin Inject 5 Units into the skin once.   TURMERIC PO Take 1 capsule by mouth daily after breakfast.   vitamin B-12 1000 MCG tablet Commonly known as: CYANOCOBALAMIN Take 1,000 mcg by mouth daily.   vitamin C 250 MG tablet Commonly known as: ASCORBIC ACID Take 250 mg by mouth daily.   Vitamin D3 25 MCG (1000 UT) Caps Take 1,000 Units by mouth daily.   zinc oxide 20 % ointment Apply 1 application topically as needed for irritation.        Review of Systems  Constitutional:  Positive for activity change, appetite change and unexpected weight change.  HENT: Negative.    Respiratory: Negative.    Cardiovascular: Negative.   Gastrointestinal: Negative.   Genitourinary: Negative.   Musculoskeletal:  Positive for gait problem.  Skin: Negative.   Neurological:  Positive for weakness.  Psychiatric/Behavioral:  Positive for dysphoric mood.   All other systems reviewed and are negative.  Vitals:   01/20/21 1554  BP: 128/74  Pulse: 73  Resp: 16  Temp: (!) 97.3 F (36.3 C)  SpO2: 95%  Weight: 130 lb 8 oz (59.2 kg)  Height: 5\' 8"  (1.727 m)   Body mass index is 19.84  kg/m. Physical Exam Vitals reviewed.  Constitutional:      Appearance: Normal appearance.     Comments: Very Frail   HENT:     Head: Normocephalic.     Nose: Nose normal.  Eyes:     Pupils: Pupils are equal, round, and reactive to light.  Cardiovascular:     Rate and Rhythm: Normal rate and regular rhythm.     Pulses: Normal pulses.  Pulmonary:     Effort: Pulmonary effort is normal.     Breath sounds: Normal breath sounds.  Abdominal:     General: Abdomen is flat. Bowel sounds are normal.     Palpations: Abdomen is soft.  Musculoskeletal:        General: No swelling.     Cervical back: Neck supple.  Skin:    General: Skin is warm and dry.  Neurological:     General: No focal deficit present.     Mental Status: He is alert and oriented to person, place, and time.  Psychiatric:        Mood and Affect: Mood normal.        Thought Content: Thought content normal.    Labs reviewed: Basic Metabolic Panel: Recent Labs    01/01/21 0349 01/02/21 0327 01/03/21 0434 01/04/21 0044 01/08/21 0527 01/09/21 0152 01/10/21 0440  NA 139 138 140   < > 138 137 138  K 3.7 4.2 4.0   < > 3.3* 3.9 3.6  CL 102 102 103   < > 100 100 103  CO2 24 27 26    < > 25 27 25   GLUCOSE 88 92 83   < > 75 112* 90  BUN 64* 33* 55*   < > 69* 40* 66*  CREATININE 5.21* 3.30* 4.39*   < > 4.65* 3.40* 4.79*  CALCIUM 8.1* 8.3* 8.5*   < > 8.2* 8.2* 8.1*  MG 1.8 1.7 1.9  --   --   --   --   PHOS 6.7* 4.3 6.2*   < > 5.5* 3.5 5.2*   < > = values in this interval not displayed.   Liver Function Tests: Recent Labs    12/10/20 1713 12/11/20 1209 01/08/21 0527 01/09/21 0152 01/10/21 0440  AST 12*  --   --   --   --   ALT 9  --   --   --   --   ALKPHOS 66  --   --   --   --   BILITOT 0.3  --   --   --   --   PROT 7.3  --   --   --   --   ALBUMIN 3.5   < > 2.6* 2.4* 2.4*   < > = values in this interval not displayed.   No results for input(s): LIPASE, AMYLASE in the last 8760 hours. No results for  input(s): AMMONIA in the last 8760 hours. CBC: Recent Labs    12/10/20 1713 12/10/20 2323 01/06/21 0302 01/07/21 0248 01/08/21 0527  WBC 11.8*   < > 9.2 13.5* 11.8*  NEUTROABS 10.0*  --   --   --   --  HGB 9.4*   < > 8.4* 8.1* 8.4*  HCT 28.4*   < > 26.7* 26.0* 27.5*  MCV 90.2   < > 97.8 97.4 98.9  PLT 169   < > 159 152 173   < > = values in this interval not displayed.   Cardiac Enzymes: Recent Labs    12/10/20 1852  CKTOTAL 66   BNP: Invalid input(s): POCBNP Lab Results  Component Value Date   HGBA1C 5.7 (H) 12/08/2019   Lab Results  Component Value Date   TSH 4.178 12/30/2020   Lab Results  Component Value Date   VITAMINB12 2,201 (H) 12/10/2020   Lab Results  Component Value Date   FOLATE 17.6 12/10/2020   Lab Results  Component Value Date   IRON 26 (L) 12/10/2020   TIBC 232 (L) 12/10/2020   FERRITIN 307 12/10/2020    Imaging and Procedures obtained prior to SNF admission: CT ABDOMEN PELVIS WO CONTRAST  Result Date: 12/10/2020 CLINICAL DATA:  Hematuria, cough.  Shortness of breath. EXAM: CT CHEST, ABDOMEN AND PELVIS WITHOUT CONTRAST TECHNIQUE: Multidetector CT imaging of the chest, abdomen and pelvis was performed following the standard protocol without IV contrast. COMPARISON:  July 01, 2020. FINDINGS: CT CHEST FINDINGS Cardiovascular: Status post stent graft repair of transverse aortic arch and descending thoracic aorta. Status post coronary bypass graft. Normal cardiac size. No pericardial effusion. Stable aneurysmal dilatation of distal descending thoracic aorta measuring 5.5 cm. Mediastinum/Nodes: No enlarged mediastinal, hilar, or axillary lymph nodes. Thyroid gland, trachea, and esophagus demonstrate no significant findings. Lungs/Pleura: No pneumothorax is noted. Stable probable scarring is noted in the right lower lobe. New airspace opacity is noted in left upper lobe concerning for pneumonia. Musculoskeletal: No chest wall mass or suspicious bone  lesions identified. CT ABDOMEN PELVIS FINDINGS Hepatobiliary: No focal liver abnormality is seen. No gallstones, gallbladder wall thickening, or biliary dilatation. Pancreas: Unremarkable. No pancreatic ductal dilatation or surrounding inflammatory changes. Spleen: Normal in size without focal abnormality. Adrenals/Urinary Tract: Adrenal glands are unremarkable. Kidneys are normal, without renal calculi, focal lesion, or hydronephrosis. Bladder is unremarkable. Stomach/Bowel: The stomach appears normal. There is no evidence of bowel obstruction or inflammation. Vascular/Lymphatic: Aneurysmal dilatation of abdominal aorta is noted, with maximum measured diameter 5.1 cm which is not significantly changed compared to prior exam. No adenopathy is noted. Reproductive: Prostate is unremarkable. Other: No abdominal wall hernia or abnormality. No abdominopelvic ascites. Musculoskeletal: No acute or significant osseous findings. IMPRESSION: New left upper lobe airspace opacity is noted concerning for possible pneumonia. Status post stent graft repair of descending thoracic aortic aneurysm, which has a maximum measured diameter 5.5 cm distally which is unchanged compared to prior exam. Grossly stable 5.1 cm infrarenal abdominal aortic aneurysm is noted. Recommend follow-up CT/MR every 6 months and vascular consultation. This recommendation follows ACR consensus guidelines: White Paper of the ACR Incidental Findings Committee II on Vascular Findings. J Am Coll Radiol 2013; 10:789-794. Aortic Atherosclerosis (ICD10-I70.0). Electronically Signed   By: Marijo Conception M.D.   On: 12/10/2020 19:34   CT Chest Wo Contrast  Result Date: 12/10/2020 CLINICAL DATA:  Hematuria, cough.  Shortness of breath. EXAM: CT CHEST, ABDOMEN AND PELVIS WITHOUT CONTRAST TECHNIQUE: Multidetector CT imaging of the chest, abdomen and pelvis was performed following the standard protocol without IV contrast. COMPARISON:  July 01, 2020. FINDINGS: CT  CHEST FINDINGS Cardiovascular: Status post stent graft repair of transverse aortic arch and descending thoracic aorta. Status post coronary bypass graft. Normal cardiac size. No  pericardial effusion. Stable aneurysmal dilatation of distal descending thoracic aorta measuring 5.5 cm. Mediastinum/Nodes: No enlarged mediastinal, hilar, or axillary lymph nodes. Thyroid gland, trachea, and esophagus demonstrate no significant findings. Lungs/Pleura: No pneumothorax is noted. Stable probable scarring is noted in the right lower lobe. New airspace opacity is noted in left upper lobe concerning for pneumonia. Musculoskeletal: No chest wall mass or suspicious bone lesions identified. CT ABDOMEN PELVIS FINDINGS Hepatobiliary: No focal liver abnormality is seen. No gallstones, gallbladder wall thickening, or biliary dilatation. Pancreas: Unremarkable. No pancreatic ductal dilatation or surrounding inflammatory changes. Spleen: Normal in size without focal abnormality. Adrenals/Urinary Tract: Adrenal glands are unremarkable. Kidneys are normal, without renal calculi, focal lesion, or hydronephrosis. Bladder is unremarkable. Stomach/Bowel: The stomach appears normal. There is no evidence of bowel obstruction or inflammation. Vascular/Lymphatic: Aneurysmal dilatation of abdominal aorta is noted, with maximum measured diameter 5.1 cm which is not significantly changed compared to prior exam. No adenopathy is noted. Reproductive: Prostate is unremarkable. Other: No abdominal wall hernia or abnormality. No abdominopelvic ascites. Musculoskeletal: No acute or significant osseous findings. IMPRESSION: New left upper lobe airspace opacity is noted concerning for possible pneumonia. Status post stent graft repair of descending thoracic aortic aneurysm, which has a maximum measured diameter 5.5 cm distally which is unchanged compared to prior exam. Grossly stable 5.1 cm infrarenal abdominal aortic aneurysm is noted. Recommend follow-up  CT/MR every 6 months and vascular consultation. This recommendation follows ACR consensus guidelines: White Paper of the ACR Incidental Findings Committee II on Vascular Findings. J Am Coll Radiol 2013; 10:789-794. Aortic Atherosclerosis (ICD10-I70.0). Electronically Signed   By: Marijo Conception M.D.   On: 12/10/2020 19:34   DG Chest Port 1 View  Result Date: 12/10/2020 CLINICAL DATA:  Cough and shortness of breath for 2 weeks EXAM: PORTABLE CHEST 1 VIEW COMPARISON:  07/01/2020 FINDINGS: Cardiac shadow is enlarged but stable. Diffuse aortic stent graft is noted in the descending thoracic aorta. Postsurgical changes are again noted and stable. Chronic pleural thickening laterally in the right lung base is noted. No focal infiltrate or sizable effusion is seen. No bony abnormality is seen. Stable calcified pleural plaques are noted. IMPRESSION: Stable appearance of the chest as described. Electronically Signed   By: Inez Catalina M.D.   On: 12/10/2020 16:58    Assessment/Plan Acute kidney injury [facility]) Now on Dialysis D/W wife who has not been able to talk to Provider in Dialysis as she has lot of concerns with his weakness We have called Dialysis center and they will have social worker talk to her All his labs are getting done there   Benign prostatic hyperplasia with urinary retention Was doing self Catherization but does not have much Urine Output here  Coronary artery disease I On Aspirin and Statin   Other iron deficiency anemia On Iron follow with Renal Weakness Weakness worse on Dialysis days  doing well with therapy Hemodialysis-associated hypotension On Midodrine PRN  AAA (abdominal aortic aneurysm) without rupture ([place]) Follows with Vascular   Family/ staff Communication:   Labs/tests ordered:

## 2021-01-25 ENCOUNTER — Telehealth: Payer: Self-pay | Admitting: Adult Health

## 2021-01-25 NOTE — Telephone Encounter (Signed)
Nurse called to report this resident had a cough. No fever, decreased 02 sats or sob. Covid test negative. CXR was ordered which showed atelectasis vs mild infiltrate and s/p sternotomy.  The nurse reports his cough is getting better. He was recently on antibiotics for pna. Will hold off on treatment at this time and have [name] provider follow up with pt.

## 2021-01-26 ENCOUNTER — Encounter: Payer: Self-pay | Admitting: Orthopedic Surgery

## 2021-01-26 ENCOUNTER — Non-Acute Institutional Stay (SKILLED_NURSING_FACILITY): Payer: Medicare Other | Admitting: Orthopedic Surgery

## 2021-01-26 DIAGNOSIS — R058 Other specified cough: Secondary | ICD-10-CM | POA: Diagnosis not present

## 2021-01-26 DIAGNOSIS — N401 Enlarged prostate with lower urinary tract symptoms: Secondary | ICD-10-CM

## 2021-01-26 DIAGNOSIS — R338 Other retention of urine: Secondary | ICD-10-CM

## 2021-01-26 DIAGNOSIS — I10 Essential (primary) hypertension: Secondary | ICD-10-CM

## 2021-01-26 DIAGNOSIS — N179 Acute kidney failure, unspecified: Secondary | ICD-10-CM

## 2021-01-26 DIAGNOSIS — I2510 Atherosclerotic heart disease of native coronary artery without angina pectoris: Secondary | ICD-10-CM | POA: Diagnosis not present

## 2021-01-26 DIAGNOSIS — R531 Weakness: Secondary | ICD-10-CM

## 2021-01-26 DIAGNOSIS — I953 Hypotension of hemodialysis: Secondary | ICD-10-CM

## 2021-01-26 NOTE — Progress Notes (Signed)
Location:   [facility] Room Number: [place] of Service:  SNF (31) Provider:  Windell Moulding, NP    Patient Care Team: [name], MD as PCP - General (Family Medicine) Debara Pickett Nadean Corwin, MD as PCP - Cardiology (Cardiology)  Extended Emergency Contact Information Primary Emergency Contact: Wylene Simmer Address: [address] [place]          [place] Phone: [phone] Mobile Phone: [phone] Relation: Spouse Secondary Emergency Contact: [place] Mobile Phone: [phone] Relation: Daughter  Code Status:  DNR Goals of care: Advanced Directive information Advanced Directives 01/26/2021  Does Patient Have a Medical Advance Directive? Yes  Type of Paramedic of [place];Living will;Out of facility DNR (pink MOST or yellow form)  Does patient want to make changes to medical advance directive? No - Patient declined  Copy of [place] in Chart? -  Would patient like information on creating a medical advance directive? -  Pre-existing out of facility DNR order (yellow form or pink MOST form) Yellow form placed in chart (order not valid for inpatient use);Pink MOST form placed in chart (order not valid for inpatient use)     Chief Complaint  Patient presents with   Acute Visit    Patient complains of a cough    HPI:  Pt is a 87 y.o. male seen today for an acute visit for productive cough.   He currently resides on the skilled unit at [facility] Past medical history includes: AAA, aneurysm of thoracic aorta, CAD, HTN, recent CAP, lumbar radiculopathy, BPH with retention, and acute kidney injury.  Patient complains of productive cough. Sputum described as brown/tan and thick. Cough worse in AM. Cough started when he was diagnosed with pneumonia a few weeks ago. He was treated for CAP during his hospitalization at [facility] 05/20-06/20. 05/20 CT chest confirmed left upper  lobe pneumonia. He was given rocephin, azithromycin and albuterol. 06/03 CXR noted mild bibasilar atelectasis and infiltrates. 07/04 CXR ordered by oncall NP due to cough, results unchanged from last CXR. He denies chest pain, sob, fever and fatigue. He is asking for something to help secretions easier to cough.   Acute kidney injury, nonoliguric- continues dialysis TTS, does not like dialysis sessions due to nursing staff and time consuming, continues to be followed by palliative.   CAD- s/p CAGB 2010, aspirin 81 mg daily HTN- remains on midodrine 5 mg prn due to dialysis TTS Chronic back pain, denies pain today, reports sitting in uncomfortable positions during dialysis, he will bring pillow with him  BPH- continue to have low UOP and self cath  Recent blood pressures:  07/05- 147/74  06/28- 128/74  06/27-  110/66  Recent weights:   07/06- 125.5 lbs  07/01- 134.8 lbs  06/29- 130.5 lbs  Nurse does not report any concerns, vitals stable.   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 bph, pt self catheterizes himself on schedule   Complication of anesthesia    had to have Foley post op cabg and lumb lam   Coronary artery disease    s/p remote PCI // s/p CABG in 2010 Echo 01/2019: EF 60-65, Gr 1 DD, normal RVSF,  RVSP 36, mild LAE, mild to mod TR   Hypertension    PVC's (premature ventricular contractions)    Monitor 12/2018:  Sinus rhythm with frequent PVC's (11.5%) and periods of bigeminy.    Thoracic aortic aneurysm ([place])    09/29/16 CT: 4.3 cm ascending, 4.5 cm descending. 1 yr f/u rec // s/p endovascular repair 09/2019 (Dr. Carlis Abbott)   Urinary retention    Wears glasses    Past Surgical History:  Procedure Laterality Date   BACK SURGERY  2012   lumb lam   CARDIAC CATHETERIZATION      2010-per patient   COLONOSCOPY     CORONARY ARTERY BYPASS GRAFT  2010   triple   EPIGASTRIC HERNIA REPAIR N/A 09/11/2017   Procedure: HERNIA REPAIR EPIGASTRIC ADULT;  Surgeon: Rolm Bookbinder, MD;  Location: [facility];  Service: General;  Laterality: N/A;   HERNIA REPAIR     rih [place]  09/11/2017   with mesh   INGUINAL HERNIA REPAIR  07/30/2012   Procedure: HERNIA REPAIR INGUINAL ADULT;  Surgeon: Rolm Bookbinder, MD;  Location: [facility];  Service: General;  Laterality: Left;  Left Hernia Site   INSERTION OF DIALYSIS CATHETER N/A 12/24/2020   Procedure: INSERTION OF DIALYSIS CATHETER;  Surgeon: Kipp Brood, MD;  Location: [facility];  Service: Pulmonary;  Laterality: N/A;   INSERTION OF MESH  07/30/2012   Procedure: INSERTION OF MESH;  Surgeon: Rolm Bookbinder, MD;  Location: [facility];  Service: General;  Laterality: Left;  Left Inguinal Hernia   INSERTION OF MESH N/A 09/11/2017   Procedure: INSERTION OF MESH;  Surgeon: Rolm Bookbinder, MD;  Location: [facility];  Service: General;  Laterality: N/A;   IR FLUORO GUIDE CV LINE LEFT  12/20/2020   IR PERC TUN PERIT CATH WO PORT S&I /IMAG  01/04/2021   IR US GUIDE [place] LEFT  12/20/2020   IR US GUIDE [place] RIGHT  01/04/2021   LAPAROSCOPY N/A 09/11/2017   Procedure: DIAGNOSTIC LAPAOSCOPY;  Surgeon: Rolm Bookbinder, MD;  Location: [place];  Service: General;  Laterality: N/A;  ERAS pathway   SPINE SURGERY     lumbar laminectomy   THORACIC AORTIC ENDOVASCULAR STENT GRAFT N/A 09/22/2019   Procedure: THORACIC AORTIC ENDOVASCULAR STENT GRAFT;  Surgeon: Marty Heck, MD;  Location: [facility];  Service: Vascular;  Laterality: N/A;   TONSILLECTOMY      Allergies  Allergen Reactions   Quinolones Other (See Comments)    Patient has an abdominal aortic aneurysm    Naltrexone     Other reaction(s): GI Upset (intolerance), NVD    Allergies as of 01/26/2021       Reactions   Quinolones Other  (See Comments)   Patient has an abdominal aortic aneurysm    Naltrexone    Other reaction(s): GI Upset (intolerance), NVD        Medication List        Accurate as of January 26, 2021  9:19 AM. If you have any questions, ask your nurse or doctor.          aspirin 81 MG tablet Take 81 mg by mouth daily after supper.   atorvastatin 20 MG tablet Commonly known as: LIPITOR TAKE 1 TABLET ONCE DAILY AT 6 PM.   Co-Enzyme Q-10 100 MG Caps Take 100 mg by mouth daily.   CRANBERRY EXTRACT PO Take 1 tablet by mouth daily.   ferrous sulfate 325 (65 FE) MG tablet Take  325 mg by mouth daily with breakfast.   loratadine 10 MG tablet Commonly known as: CLARITIN Take 10 mg by mouth daily as needed for allergies or rhinitis.   midodrine 5 MG tablet Commonly known as: PROAMATINE Take 1 tablet (5 mg total) by mouth 3 (three) times daily as needed (SBP <120).   Nasacort Allergy 24HR 55 MCG/ACT Aero nasal inhaler Generic drug: triamcinolone Place 2 sprays into the nose daily as needed (for allergies).   oxymetazoline 0.05 % nasal spray Commonly known as: AFRIN Place 1 spray into both nostrils at bedtime as needed for congestion.   polyethylene glycol 17 g packet Commonly known as: MIRALAX / GLYCOLAX Take 17 g by mouth daily as needed for moderate constipation.   PRESERVISION AREDS 2 PO Take 1 capsule by mouth 2 (two) times daily.   simethicone 125 MG chewable tablet Commonly known as: MYLICON Chew 401 mg by mouth once. 2 capsules at bedtimes and 2 prior to test. Start taking on: February 14, 2021   sodium chloride 5 % ophthalmic ointment Commonly known as: MURO 027 Place 1 application into both eyes at bedtime.   TURMERIC PO Take 1 capsule by mouth daily after breakfast.   vitamin B-12 1000 MCG tablet Commonly known as: CYANOCOBALAMIN Take 1,000 mcg by mouth daily.   vitamin C 250 MG tablet Commonly known as: ASCORBIC ACID Take 250 mg by mouth daily.   Vitamin D3 25 MCG  (1000 UT) Caps Take 1,000 Units by mouth daily.   zinc oxide 20 % ointment Apply 1 application topically as needed for irritation.        Review of Systems  Constitutional:  Negative for activity change, appetite change, fatigue and fever.  HENT:  Negative for dental problem and trouble swallowing.   Eyes:  Negative for visual disturbance.  Respiratory:  Positive for cough. Negative for shortness of breath and wheezing.   Cardiovascular:  Negative for chest pain and leg swelling.  Gastrointestinal:  Negative for abdominal distention, abdominal pain, constipation, diarrhea and nausea.  Genitourinary:  Positive for difficulty urinating. Negative for dysuria, frequency and hematuria.  Musculoskeletal:  Positive for arthralgias, back pain, gait problem and myalgias.  Neurological:  Positive for weakness. Negative for dizziness and headaches.  Psychiatric/Behavioral:  Negative for confusion, dysphoric mood and sleep disturbance. The patient is not nervous/anxious.    Immunization History  Administered Date(s) Administered   Influenza Split 04/06/2009, 04/08/2010   Influenza, High Dose Seasonal PF 04/13/2014, 05/01/2016   Influenza,inj,Quad PF,6+ Mos 04/11/2011   Influenza,inj,quad, With Preservative 05/11/2015, 03/29/2016   Influenza-Unspecified 05/07/2017, 05/07/2018, 05/09/2018, 04/29/2019, 04/13/2020   PFIZER(Purple Top)SARS-COV-2 Vaccination 07/26/2019, 08/13/2019, 09/01/2019   Pneumococcal Conjugate-13 05/11/2015   Td 04/06/2009   Tdap 05/28/2012   Zoster, Live 11/24/2016, 03/02/2017   Pertinent  Health Maintenance Due  Topic Date Due   PNA vac Low Risk Adult (2 of 2 - PPSV23) 05/10/2016   INFLUENZA VACCINE  02/21/2021   No flowsheet data found. Functional Status Survey:    Vitals:   01/26/21 0915  BP: (!) 147/74  Pulse: 89  Resp: 16  Temp: 99.1 F (37.3 C)  SpO2: 94%  Weight: 134 lb 12.8 oz (61.1 kg)  Height: 5\' 8"  (1.727 m)   Body mass index is 20.5  kg/m. Physical Exam Vitals reviewed.  HENT:     Head: Normocephalic.     Right Ear: There is no impacted cerumen.     Left Ear: There is no impacted cerumen.  Nose: Nose normal.     Mouth/Throat:     Mouth: Mucous membranes are moist.  Eyes:     General:        Right eye: No discharge.        Left eye: No discharge.  Cardiovascular:     Rate and Rhythm: Normal rate and regular rhythm.     Pulses: Normal pulses.     Heart sounds: Normal heart sounds. No murmur heard. Pulmonary:     Effort: Pulmonary effort is normal. No respiratory distress.     Breath sounds: Normal breath sounds. No wheezing.  Abdominal:     General: Bowel sounds are normal. There is no distension.     Palpations: Abdomen is soft.     Tenderness: There is no abdominal tenderness.  Musculoskeletal:     Cervical back: Normal range of motion.     Right lower leg: No edema.     Left lower leg: No edema.  Lymphadenopathy:     Cervical: No cervical adenopathy.  Skin:    General: Skin is warm and dry.     Capillary Refill: Capillary refill takes less than 2 seconds.  Neurological:     General: No focal deficit present.     Mental Status: He is alert and oriented to person, place, and time.     Motor: Weakness present.     Gait: Gait abnormal.     Comments: walker  Psychiatric:        Mood and Affect: Mood normal.        Behavior: Behavior normal.    Labs reviewed: Recent Labs    01/01/21 0349 01/02/21 0327 01/03/21 0434 01/04/21 0044 01/08/21 0527 01/09/21 0152 01/10/21 0440  NA 139 138 140   < > 138 137 138  K 3.7 4.2 4.0   < > 3.3* 3.9 3.6  CL 102 102 103   < > 100 100 103  CO2 24 27 26    < > 25 27 25   GLUCOSE 88 92 83   < > 75 112* 90  BUN 64* 33* 55*   < > 69* 40* 66*  CREATININE 5.21* 3.30* 4.39*   < > 4.65* 3.40* 4.79*  CALCIUM 8.1* 8.3* 8.5*   < > 8.2* 8.2* 8.1*  MG 1.8 1.7 1.9  --   --   --   --   PHOS 6.7* 4.3 6.2*   < > 5.5* 3.5 5.2*   < > = values in this interval not  displayed.   Recent Labs    12/10/20 1713 12/11/20 1209 01/08/21 0527 01/09/21 0152 01/10/21 0440  AST 12*  --   --   --   --   ALT 9  --   --   --   --   ALKPHOS 66  --   --   --   --   BILITOT 0.3  --   --   --   --   PROT 7.3  --   --   --   --   ALBUMIN 3.5   < > 2.6* 2.4* 2.4*   < > = values in this interval not displayed.   Recent Labs    12/10/20 1713 12/10/20 2323 01/06/21 0302 01/07/21 0248 01/08/21 0527  WBC 11.8*   < > 9.2 13.5* 11.8*  NEUTROABS 10.0*  --   --   --   --   HGB 9.4*   < > 8.4* 8.1* 8.4*  HCT 28.4*   < >  26.7* 26.0* 27.5*  MCV 90.2   < > 97.8 97.4 98.9  PLT 169   < > 159 152 173   < > = values in this interval not displayed.   Lab Results  Component Value Date   TSH 4.178 12/30/2020   Lab Results  Component Value Date   HGBA1C 5.7 (H) 12/08/2019   No results found for: CHOL, HDL, LDLCALC, LDLDIRECT, TRIG, CHOLHDL  Significant Diagnostic Results in last 30 days:  IR US Guide Vasc Access Right  Result Date: 01/04/2021 INDICATION: 87 year old with renal failure and needs a tunneled dialysis catheter. EXAM: FLUOROSCOPIC AND ULTRASOUND GUIDED PLACEMENT OF A TUNNELED DIALYSIS CATHETER Physician: Stephan Minister. Anselm Pancoast, MD MEDICATIONS: Ancef 2 g; The antibiotic was administered within an appropriate time interval prior to skin puncture. ANESTHESIA/SEDATION: Versed 0.5 mg IV; Fentanyl 25 mcg IV; Moderate Sedation Time:  20 minutes The patient was continuously monitored during the procedure by the interventional radiology nurse under my direct supervision. FLUOROSCOPY TIME:  Fluoroscopy Time: 24 seconds, 1 mGy COMPLICATIONS: None immediate. PROCEDURE: Informed consent was obtained for placement of a tunneled dialysis catheter. The patient was placed supine on the interventional table. Ultrasound confirmed a patent right internal jugular vein. Ultrasound images were obtained for documentation. The right neck and chest was prepped and draped in a sterile fashion.  Maximal barrier sterile technique was utilized including caps, mask, sterile gowns, sterile gloves, sterile drape, hand hygiene and skin antiseptic. The right neck was anesthetized with 1% lidocaine. A small incision was made with #11 blade scalpel. A 21 gauge needle directed into the right internal jugular vein with ultrasound guidance. A micropuncture dilator set was placed. A 23 cm tip to cuff Palindrome catheter was selected. The skin below the right clavicle was anesthetized and a small incision was made with an #11 blade scalpel. A subcutaneous tunnel was formed to the vein dermatotomy site. The catheter was brought through the tunnel. The vein dermatotomy site was dilated to accommodate a peel-away sheath. The catheter was placed through the peel-away sheath and directed into the central venous structures. The tip of the catheter was placed at the superior cavoatrial junction with fluoroscopy. Fluoroscopic images were obtained for documentation. Both lumens were found to aspirate and flush well. The proper amount of heparin was flushed in both lumens. The vein dermatotomy site was closed using a single layer of absorbable suture and Dermabond. The catheter was secured to the skin using Prolene suture. IMPRESSION: Successful placement of a right jugular tunneled dialysis catheter using ultrasound and fluoroscopic guidance. Electronically Signed   By: Markus Daft M.D.   On: 01/04/2021 20:02   IR TUNNELED CENTRAL VENOUS CATHETER PLACEMENT  Result Date: 01/04/2021 INDICATION: 87 year old with renal failure and needs a tunneled dialysis catheter. EXAM: FLUOROSCOPIC AND ULTRASOUND GUIDED PLACEMENT OF A TUNNELED DIALYSIS CATHETER Physician: Stephan Minister. Anselm Pancoast, MD MEDICATIONS: Ancef 2 g; The antibiotic was administered within an appropriate time interval prior to skin puncture. ANESTHESIA/SEDATION: Versed 0.5 mg IV; Fentanyl 25 mcg IV; Moderate Sedation Time:  20 minutes The patient was continuously monitored during the  procedure by the interventional radiology nurse under my direct supervision. FLUOROSCOPY TIME:  Fluoroscopy Time: 24 seconds, 1 mGy COMPLICATIONS: None immediate. PROCEDURE: Informed consent was obtained for placement of a tunneled dialysis catheter. The patient was placed supine on the interventional table. Ultrasound confirmed a patent right internal jugular vein. Ultrasound images were obtained for documentation. The right neck and chest was prepped and draped in a sterile fashion.  Maximal barrier sterile technique was utilized including caps, mask, sterile gowns, sterile gloves, sterile drape, hand hygiene and skin antiseptic. The right neck was anesthetized with 1% lidocaine. A small incision was made with #11 blade scalpel. A 21 gauge needle directed into the right internal jugular vein with ultrasound guidance. A micropuncture dilator set was placed. A 23 cm tip to cuff Palindrome catheter was selected. The skin below the right clavicle was anesthetized and a small incision was made with an #11 blade scalpel. A subcutaneous tunnel was formed to the vein dermatotomy site. The catheter was brought through the tunnel. The vein dermatotomy site was dilated to accommodate a peel-away sheath. The catheter was placed through the peel-away sheath and directed into the central venous structures. The tip of the catheter was placed at the superior cavoatrial junction with fluoroscopy. Fluoroscopic images were obtained for documentation. Both lumens were found to aspirate and flush well. The proper amount of heparin was flushed in both lumens. The vein dermatotomy site was closed using a single layer of absorbable suture and Dermabond. The catheter was secured to the skin using Prolene suture. IMPRESSION: Successful placement of a right jugular tunneled dialysis catheter using ultrasound and fluoroscopic guidance. Electronically Signed   By: Markus Daft M.D.   On: 01/04/2021 20:02    Assessment/Plan 1. Productive  cough - diagnosed with CAP (left upper love infiltrate) last hospitalization - CXR unchanged from 06/03 - sputum thick and brown, no other symptoms - guaifenesin 600 mg po bid x 10 days - incentive spirometer 10x tid x 10 days, then daily prn  2. Acute kidney injury ([place]) - cont dialysis TTS  3. Benign prostatic hyperplasia with urinary retention - low UOP due to poor renal function - cont to self cath  4. Coronary artery disease involving native heart, unspecified vessel or lesion type, unspecified whether angina present - s/p CABG 2010 - cont aspirin 81 mg daily  5. Essential hypertension - controlled without medication  6. Weakness - cont PT/OT  7. Hemodialysis-associated hypotension - cont midodrine 5 mg prn for SBP< 120    Family/ staff Communication: plan discussed with patient and nurse  Labs/tests ordered:  labs requested from [employer]

## 2021-02-04 ENCOUNTER — Encounter: Payer: Self-pay | Admitting: Orthopedic Surgery

## 2021-02-04 ENCOUNTER — Non-Acute Institutional Stay (SKILLED_NURSING_FACILITY): Payer: Medicare Other | Admitting: Orthopedic Surgery

## 2021-02-04 DIAGNOSIS — N179 Acute kidney failure, unspecified: Secondary | ICD-10-CM

## 2021-02-04 DIAGNOSIS — R058 Other specified cough: Secondary | ICD-10-CM | POA: Diagnosis not present

## 2021-02-04 DIAGNOSIS — R338 Other retention of urine: Secondary | ICD-10-CM

## 2021-02-04 DIAGNOSIS — I2510 Atherosclerotic heart disease of native coronary artery without angina pectoris: Secondary | ICD-10-CM

## 2021-02-04 DIAGNOSIS — N401 Enlarged prostate with lower urinary tract symptoms: Secondary | ICD-10-CM

## 2021-02-04 DIAGNOSIS — R531 Weakness: Secondary | ICD-10-CM

## 2021-02-04 NOTE — Progress Notes (Signed)
Location:   [employer] [facility] Room Number: N35 Place of Service:  SNF (31)  Provider: Windell Moulding, NP  PCP: [name], MD Patient Care Team: [name], MD as PCP - General (Family Medicine) Debara Pickett Nadean Corwin, MD as PCP - Cardiology (Cardiology)  Extended Emergency Contact Information Primary Emergency Contact: Wylene Simmer Address: [place]          [place] Phone: [phone] Mobile Phone: [phone] Relation: Spouse Secondary Emergency Contact: [place] Mobile Phone: [phone] Relation: Daughter  Code Status: DNR Goals of care:  Advanced Directive information Advanced Directives 02/04/2021  Does Patient Have a Medical Advance Directive? Yes  Type of Paramedic of [place];Living will;Out of facility DNR (pink MOST or yellow form)  Does patient want to make changes to medical advance directive? No - Patient declined  Copy of [place] in Chart? Yes - validated most recent copy scanned in chart (See row information)  Would patient like information on creating a medical advance directive? -  Pre-existing out of facility DNR order (yellow form or pink MOST form) Yellow form placed in chart (order not valid for inpatient use)     Allergies  Allergen Reactions   Quinolones Other (See Comments)    Patient has an abdominal aortic aneurysm    Naltrexone     Other reaction(s): GI Upset (intolerance), NVD    Chief Complaint  Patient presents with   Transitions Of Care    Discharge of patient from SNF      HPI:  87 y.o. male  seen today for discharge evaluation.   Hospitalized 05/20-06/20 for acute renal failure and pneumonia. He was transferred to [facility] for additional PT/OT and skilled nursing care. PMH includes: CAD s/p CAGB 2010, CKD, HTN, chronic back pain, PAF, HLD, neuropathy, BPH and AAA repair in 2021.   Acute renal failure-  continues to receive dialysis TTS. Weak on treatment days, does not like dialysis center. Unable to obtain recent labs from dialysis center. He continues to be on fluid restriction. Unknown when follow up with nephrology is.  BPH- history of self catherization, discharged from hospital with foley, output fair.  CAD- CABG 2010, remains on aspirin and statin.  Cough- productive cough in the past weak, sputum clear and thin, mucinex started, CXR 07/04 continued to show mild infiltrate, remains afebrile, denies chest pain and sob  No recent falls, injuries or behavioral outbursts. Ambulating with walker about 150 ft.   Recent blood pressures:  07/12- 105/69  07/05- 147/74  06/28- 128/74  Recent weights:  07/13- 132.1 lbs  07/01- 134.8 lbs  06/21 (admission) 137.5 lbs  He plans to discharge home 02/06/2021. Home health PT/OT/nurse/aide ordered. Wife and daughters are main support systems. I have advised him to follow up with PCP in 1-2 weeks. Recommend follow up with [facility].    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 bph, pt self catheterizes himself on schedule   Complication of anesthesia    had to have Foley post op cabg and lumb lam   Coronary artery disease    s/p remote PCI // s/p CABG in 2010 Echo 01/2019: EF  60-65, Gr 1 DD, normal RVSF, RVSP 36, mild LAE, mild to mod TR   Hypertension    PVC's (premature ventricular contractions)    Monitor 12/2018:  Sinus rhythm with frequent PVC's (11.5%) and periods of bigeminy.    Thoracic aortic aneurysm ([place])    09/29/16 CT: 4.3 cm ascending, 4.5 cm descending. 1 yr f/u rec // s/p endovascular repair 09/2019 (Dr. Carlis Abbott)   Urinary retention    Wears glasses     Past Surgical History:  Procedure Laterality Date   BACK  SURGERY  2012   lumb lam   CARDIAC CATHETERIZATION     2010-per patient   COLONOSCOPY     CORONARY ARTERY BYPASS GRAFT  2010   triple   EPIGASTRIC HERNIA REPAIR N/A 09/11/2017   Procedure: HERNIA REPAIR EPIGASTRIC ADULT;  Surgeon: Rolm Bookbinder, MD;  Location: [place];  Service: General;  Laterality: N/A;   HERNIA REPAIR     rih Inglis  09/11/2017   with mesh   INGUINAL HERNIA REPAIR  07/30/2012   Procedure: HERNIA REPAIR INGUINAL ADULT;  Surgeon: Rolm Bookbinder, MD;  Location: [place];  Service: General;  Laterality: Left;  Left Hernia Site   INSERTION OF DIALYSIS CATHETER N/A 12/24/2020   Procedure: INSERTION OF DIALYSIS CATHETER;  Surgeon: Kipp Brood, MD;  Location: [facility];  Service: Pulmonary;  Laterality: N/A;   INSERTION OF MESH  07/30/2012   Procedure: INSERTION OF MESH;  Surgeon: Rolm Bookbinder, MD;  Location: [place];  Service: General;  Laterality: Left;  Left Inguinal Hernia   INSERTION OF MESH N/A 09/11/2017   Procedure: INSERTION OF MESH;  Surgeon: Rolm Bookbinder, MD;  Location: [place];  Service: General;  Laterality: N/A;   IR FLUORO GUIDE CV LINE LEFT  12/20/2020   IR PERC TUN PERIT CATH WO PORT S&I /IMAG  01/04/2021   IR US GUIDE VASC ACCESS LEFT  12/20/2020   IR US GUIDE [place] RIGHT  01/04/2021   LAPAROSCOPY N/A 09/11/2017   Procedure: DIAGNOSTIC LAPAOSCOPY;  Surgeon: Rolm Bookbinder, MD;  Location: [place];  Service: General;  Laterality: N/A;  ERAS pathway   SPINE SURGERY     lumbar laminectomy   THORACIC AORTIC ENDOVASCULAR STENT GRAFT N/A 09/22/2019   Procedure: THORACIC AORTIC ENDOVASCULAR STENT GRAFT;  Surgeon: Marty Heck, MD;  Location: [place];  Service: Vascular;  Laterality: N/A;   TONSILLECTOMY        reports that he quit smoking about 36 years ago. His smoking use included cigarettes. He has a 8.00 pack-year smoking history. He has never used smokeless tobacco. He reports that he  does not drink alcohol and does not use drugs. Social History   Socioeconomic History   Marital status: Married    Spouse name: [name]   Number of children: Not on file   Years of education: Not on file   Highest education level: Not on file  Occupational History    Comment: retired  Tobacco Use   Smoking status: Former    Packs/day: 1.00    Years: 8.00    Pack years: 8.00    Types: Cigarettes    Quit date: 07/24/1984    Years since quitting: 36.5   Smokeless tobacco: Never  Vaping Use   Vaping Use: Never used  Substance and Sexual Activity   Alcohol use: No   Drug use: No   Sexual activity: Not on file  Other Topics Concern  Not on file  Social History Narrative   12/08/19 lives with wife   Social Determinants of Health   Financial Resource Strain: Not on file  Food Insecurity: Not on file  Transportation Needs: Not on file  Physical Activity: Not on file  Stress: Not on file  Social Connections: Not on file  Intimate Partner Violence: Not on file   Functional Status Survey:    Allergies  Allergen Reactions   Quinolones Other (See Comments)    Patient has an abdominal aortic aneurysm    Naltrexone     Other reaction(s): GI Upset (intolerance), NVD    Pertinent  Health Maintenance Due  Topic Date Due   PNA vac Low Risk Adult (2 of 2 - PPSV23) 05/10/2016   INFLUENZA VACCINE  02/21/2021    Medications: Allergies as of 02/04/2021       Reactions   Quinolones Other (See Comments)   Patient has an abdominal aortic aneurysm    Naltrexone    Other reaction(s): GI Upset (intolerance), NVD        Medication List        Accurate as of February 04, 2021  3:05 PM. If you have any questions, ask your nurse or doctor.          STOP taking these medications    midodrine 5 MG tablet Commonly known as: PROAMATINE Stopped by: Yvonna Alanis, NP       TAKE these medications    aspirin 81 MG tablet Take 81 mg by mouth daily after supper.   atorvastatin  20 MG tablet Commonly known as: LIPITOR TAKE 1 TABLET ONCE DAILY AT 6 PM.   Co-Enzyme Q-10 100 MG Caps Take 100 mg by mouth daily.   CRANBERRY EXTRACT PO Take 1 tablet by mouth daily.   ferrous sulfate 325 (65 FE) MG tablet Take 325 mg by mouth daily with breakfast.   guaiFENesin 600 MG 12 hr tablet Commonly known as: MUCINEX Take 600 mg by mouth 2 (two) times daily.   loratadine 10 MG tablet Commonly known as: CLARITIN Take 10 mg by mouth daily as needed for allergies or rhinitis.   Nasacort Allergy 24HR 55 MCG/ACT Aero nasal inhaler Generic drug: triamcinolone Place 2 sprays into the nose daily as needed (for allergies).   oxymetazoline 0.05 % nasal spray Commonly known as: AFRIN Place 1 spray into both nostrils at bedtime as needed for congestion.   polyethylene glycol 17 g packet Commonly known as: MIRALAX / GLYCOLAX Take 17 g by mouth daily as needed for moderate constipation.   PRESERVISION AREDS 2 PO Take 1 capsule by mouth 2 (two) times daily.   simethicone 125 MG chewable tablet Commonly known as: MYLICON Chew 790 mg by mouth once. 2 capsules at bedtimes and 2 prior to test. Start taking on: February 14, 2021   sodium chloride 5 % ophthalmic ointment Commonly known as: MURO 240 Place 1 application into both eyes at bedtime.   TURMERIC PO Take 1 capsule by mouth daily after breakfast.   vitamin B-12 1000 MCG tablet Commonly known as: CYANOCOBALAMIN Take 1,000 mcg by mouth daily.   vitamin C 250 MG tablet Commonly known as: ASCORBIC ACID Take 250 mg by mouth daily.   Vitamin D3 25 MCG (1000 UT) Caps Take 1,000 Units by mouth daily.   zinc oxide 20 % ointment Apply 1 application topically as needed for irritation.        Review of Systems  Constitutional:  Negative for  activity change, appetite change, fatigue and fever.  HENT:  Negative for hearing loss and sore throat.   Eyes:  Negative for visual disturbance.  Respiratory:  Positive for  cough. Negative for shortness of breath and wheezing.   Cardiovascular:  Negative for chest pain and leg swelling.  Gastrointestinal:  Negative for abdominal distention, abdominal pain, constipation, diarrhea and nausea.  Genitourinary:  Positive for decreased urine volume. Negative for dysuria, frequency and hematuria.  Musculoskeletal:  Positive for back pain and gait problem.  Skin: Negative.   Neurological:  Positive for weakness. Negative for dizziness and headaches.  Hematological:  Bruises/bleeds easily.  Psychiatric/Behavioral:  Negative for confusion, dysphoric mood and sleep disturbance. The patient is not nervous/anxious.    Vitals:   02/04/21 1501  BP: 105/69  Pulse: 76  Resp: 20  Temp: 97.9 F (36.6 C)  SpO2: 96%  Weight: 132 lb 1.6 oz (59.9 kg)  Height: 5\' 8"  (1.727 m)   Body mass index is 20.09 kg/m. Physical Exam Vitals reviewed.  Constitutional:      General: He is not in acute distress. HENT:     Head: Normocephalic.     Right Ear: There is no impacted cerumen.     Left Ear: There is no impacted cerumen.     Nose: Nose normal.     Mouth/Throat:     Mouth: Mucous membranes are moist.  Eyes:     General:        Right eye: No discharge.        Left eye: No discharge.  Neck:     Vascular: No carotid bruit.  Cardiovascular:     Rate and Rhythm: Normal rate and regular rhythm.     Pulses: Normal pulses.     Heart sounds: Normal heart sounds. No murmur heard. Pulmonary:     Effort: Pulmonary effort is normal. No respiratory distress.     Breath sounds: Normal breath sounds. No wheezing.  Abdominal:     General: Bowel sounds are normal. There is no distension.     Palpations: Abdomen is soft.     Tenderness: There is no abdominal tenderness.  Genitourinary:    Comments: Indwelling foley, urine yellow Musculoskeletal:     Cervical back: Normal range of motion.     Right lower leg: No edema.     Left lower leg: No edema.  Lymphadenopathy:      Cervical: No cervical adenopathy.  Skin:    General: Skin is warm and dry.     Capillary Refill: Capillary refill takes less than 2 seconds.  Neurological:     General: No focal deficit present.     Mental Status: He is alert and oriented to person, place, and time.     Motor: Weakness present.     Gait: Gait abnormal.     Comments: Wheelchair/walker  Psychiatric:        Mood and Affect: Mood normal.        Behavior: Behavior normal.    Labs reviewed: Basic Metabolic Panel: Recent Labs    01/01/21 0349 01/02/21 0327 01/03/21 0434 01/04/21 0044 01/08/21 0527 01/09/21 0152 01/10/21 0440  NA 139 138 140   < > 138 137 138  K 3.7 4.2 4.0   < > 3.3* 3.9 3.6  CL 102 102 103   < > 100 100 103  CO2 24 27 26    < > 25 27 25   GLUCOSE 88 92 83   < > 75 112* 90  BUN 64* 33* 55*   < > 69* 40* 66*  CREATININE 5.21* 3.30* 4.39*   < > 4.65* 3.40* 4.79*  CALCIUM 8.1* 8.3* 8.5*   < > 8.2* 8.2* 8.1*  MG 1.8 1.7 1.9  --   --   --   --   PHOS 6.7* 4.3 6.2*   < > 5.5* 3.5 5.2*   < > = values in this interval not displayed.   Liver Function Tests: Recent Labs    12/10/20 1713 12/11/20 1209 01/08/21 0527 01/09/21 0152 01/10/21 0440  AST 12*  --   --   --   --   ALT 9  --   --   --   --   ALKPHOS 66  --   --   --   --   BILITOT 0.3  --   --   --   --   PROT 7.3  --   --   --   --   ALBUMIN 3.5   < > 2.6* 2.4* 2.4*   < > = values in this interval not displayed.   No results for input(s): LIPASE, AMYLASE in the last 8760 hours. No results for input(s): AMMONIA in the last 8760 hours. CBC: Recent Labs    12/10/20 1713 12/10/20 2323 01/06/21 0302 01/07/21 0248 01/08/21 0527  WBC 11.8*   < > 9.2 13.5* 11.8*  NEUTROABS 10.0*  --   --   --   --   HGB 9.4*   < > 8.4* 8.1* 8.4*  HCT 28.4*   < > 26.7* 26.0* 27.5*  MCV 90.2   < > 97.8 97.4 98.9  PLT 169   < > 159 152 173   < > = values in this interval not displayed.   Cardiac Enzymes: Recent Labs    12/10/20 1852  CKTOTAL 66    BNP: Invalid input(s): POCBNP CBG: Recent Labs    12/19/20 1926 01/06/21 2056  GLUCAP 162* 114*    Procedures and Imaging Studies During Stay: No results found.  Assessment/Plan:   1. Acute kidney injury ([place]) - dialysis TTS - followed by [facility] during hospitalization- f/u unknown - weakness noted after dialysis - home health PT/OT  2. Productive cough - hx of pneumonia during hospitalization - CXR 07/04- mild infiltrate - cont mucinex   3. Coronary artery disease involving native heart, unspecified vessel or lesion type, unspecified whether angina present - stable with aspirin and statin daily  4. Benign prostatic hyperplasia with urinary retention - hx of self catherization - foley placed during hospitalization - cont foley care - home health nurse for foley care  5. Weakness - home health PT/OT/aide - cont falls safety precautions at home    Patient is being discharged with the following home health services: PT/OT/nurse/aide  Patient is being discharged with the following durable medical equipment: none  Patient has been advised to f/u with their PCP in 1-2 weeks to bring them up to date on their rehab stay.  Social services at facility was responsible for arranging this appointment.  Pt was provided with a 30 day supply of prescriptions for medications and refills must be obtained from their PCP.  For controlled substances, a more limited supply may be provided adequate until PCP appointment only.  Future labs/tests needed:  cbc/diff, cmp

## 2021-02-15 ENCOUNTER — Ambulatory Visit: Payer: Medicare Other | Admitting: Vascular Surgery

## 2021-02-15 ENCOUNTER — Other Ambulatory Visit (HOSPITAL_COMMUNITY): Payer: Medicare Other

## 2021-03-15 ENCOUNTER — Other Ambulatory Visit (HOSPITAL_COMMUNITY): Payer: Medicare Other

## 2021-03-15 ENCOUNTER — Ambulatory Visit: Payer: Medicare Other | Admitting: Vascular Surgery

## 2021-03-24 DEATH — deceased
